# Patient Record
Sex: Female | Born: 1940
Health system: Southern US, Community
[De-identification: ages and names within clinical notes are randomized; demographics above are authoritative.]

## PROBLEM LIST (undated history)

## (undated) DIAGNOSIS — M359 Systemic involvement of connective tissue, unspecified: Secondary | ICD-10-CM

## (undated) DIAGNOSIS — M797 Fibromyalgia: Secondary | ICD-10-CM

## (undated) DIAGNOSIS — F419 Anxiety disorder, unspecified: Secondary | ICD-10-CM

## (undated) DIAGNOSIS — E119 Type 2 diabetes mellitus without complications: Secondary | ICD-10-CM

## (undated) DIAGNOSIS — R739 Hyperglycemia, unspecified: Secondary | ICD-10-CM

## (undated) DIAGNOSIS — T50905A Adverse effect of unspecified drugs, medicaments and biological substances, initial encounter: Secondary | ICD-10-CM

## (undated) DIAGNOSIS — F319 Bipolar disorder, unspecified: Secondary | ICD-10-CM

## (undated) DIAGNOSIS — I10 Essential (primary) hypertension: Secondary | ICD-10-CM

## (undated) DIAGNOSIS — M1990 Unspecified osteoarthritis, unspecified site: Secondary | ICD-10-CM

## (undated) DIAGNOSIS — D500 Iron deficiency anemia secondary to blood loss (chronic): Secondary | ICD-10-CM

## (undated) HISTORY — PX: EYE SURGERY: SHX253

## (undated) HISTORY — PX: COLONOSCOPY: SHX174

## (undated) HISTORY — PX: CHOLECYSTECTOMY: SHX55

## (undated) HISTORY — PX: ABDOMINAL HYSTERECTOMY: SHX81

## (undated) HISTORY — DX: Essential (primary) hypertension: I10

## (undated) HISTORY — DX: Unspecified osteoarthritis, unspecified site: M19.90

---

## 2000-09-07 ENCOUNTER — Ambulatory Visit (HOSPITAL_COMMUNITY): Admission: RE | Admit: 2000-09-07 | Discharge: 2000-09-07 | Payer: Self-pay | Admitting: Family Medicine

## 2000-09-07 ENCOUNTER — Encounter: Payer: Self-pay | Admitting: Family Medicine

## 2000-10-07 ENCOUNTER — Encounter: Payer: Self-pay | Admitting: Gastroenterology

## 2000-10-07 ENCOUNTER — Encounter: Admission: RE | Admit: 2000-10-07 | Discharge: 2000-10-07 | Payer: Self-pay | Admitting: Gastroenterology

## 2000-10-22 ENCOUNTER — Encounter: Payer: Self-pay | Admitting: Gastroenterology

## 2000-10-22 ENCOUNTER — Ambulatory Visit (HOSPITAL_COMMUNITY): Admission: RE | Admit: 2000-10-22 | Discharge: 2000-10-22 | Payer: Self-pay | Admitting: Gastroenterology

## 2001-06-09 ENCOUNTER — Encounter: Admission: RE | Admit: 2001-06-09 | Discharge: 2001-06-09 | Payer: Self-pay | Admitting: Family Medicine

## 2001-06-09 ENCOUNTER — Encounter: Payer: Self-pay | Admitting: Family Medicine

## 2004-03-04 ENCOUNTER — Other Ambulatory Visit: Admission: RE | Admit: 2004-03-04 | Discharge: 2004-03-04 | Payer: Self-pay | Admitting: Family Medicine

## 2005-04-01 ENCOUNTER — Ambulatory Visit: Payer: Self-pay | Admitting: Cardiology

## 2005-04-07 ENCOUNTER — Ambulatory Visit: Payer: Self-pay

## 2005-11-25 ENCOUNTER — Ambulatory Visit: Payer: Self-pay | Admitting: Gastroenterology

## 2005-12-22 ENCOUNTER — Ambulatory Visit: Payer: Self-pay | Admitting: Gastroenterology

## 2005-12-22 ENCOUNTER — Encounter (INDEPENDENT_AMBULATORY_CARE_PROVIDER_SITE_OTHER): Payer: Self-pay | Admitting: *Deleted

## 2006-01-04 ENCOUNTER — Ambulatory Visit: Payer: Self-pay | Admitting: Gastroenterology

## 2006-04-23 ENCOUNTER — Other Ambulatory Visit: Admission: RE | Admit: 2006-04-23 | Discharge: 2006-04-23 | Payer: Self-pay | Admitting: Family Medicine

## 2006-06-15 ENCOUNTER — Ambulatory Visit: Payer: Self-pay | Admitting: Gastroenterology

## 2006-06-15 LAB — CONVERTED CEMR LAB
CO2: 30 meq/L (ref 19–32)
Chloride: 102 meq/L (ref 96–112)
Eosinophils Relative: 1.9 % (ref 0.0–5.0)
Hemoglobin: 12.6 g/dL (ref 12.0–15.0)
Lymphocytes Relative: 18.5 % (ref 12.0–46.0)
MCHC: 34.1 g/dL (ref 30.0–36.0)
MCV: 88.5 fL (ref 78.0–100.0)
Monocytes Absolute: 0.2 10*3/uL (ref 0.2–0.7)
Monocytes Relative: 3 % (ref 3.0–11.0)
Platelets: 365 10*3/uL (ref 150–400)
Potassium: 4.2 meq/L (ref 3.5–5.1)
RBC: 4.18 M/uL (ref 3.87–5.11)
RDW: 13.9 % (ref 11.5–14.6)
Sed Rate: 38 mm/hr — ABNORMAL HIGH (ref 0–25)

## 2006-06-16 ENCOUNTER — Encounter: Payer: Self-pay | Admitting: Gastroenterology

## 2006-06-16 ENCOUNTER — Ambulatory Visit: Payer: Self-pay | Admitting: Gastroenterology

## 2006-06-28 ENCOUNTER — Inpatient Hospital Stay (HOSPITAL_COMMUNITY): Admission: EM | Admit: 2006-06-28 | Discharge: 2006-07-05 | Payer: Self-pay | Admitting: Emergency Medicine

## 2006-07-02 ENCOUNTER — Ambulatory Visit: Payer: Self-pay | Admitting: Gastroenterology

## 2006-07-13 ENCOUNTER — Ambulatory Visit: Payer: Self-pay | Admitting: Gastroenterology

## 2006-07-13 LAB — CONVERTED CEMR LAB
ALT: 32 units/L (ref 0–40)
Albumin: 3.3 g/dL — ABNORMAL LOW (ref 3.5–5.2)
Alkaline Phosphatase: 55 units/L (ref 39–117)
BUN: 12 mg/dL (ref 6–23)
Basophils Relative: 1.4 % — ABNORMAL HIGH (ref 0.0–1.0)
Bilirubin, Direct: 0.1 mg/dL (ref 0.0–0.3)
Eosinophils Absolute: 0 10*3/uL (ref 0.0–0.6)
Eosinophils Relative: 0 % (ref 0.0–5.0)
GFR calc Af Amer: 81 mL/min
HCT: 37.6 % (ref 36.0–46.0)
Lymphocytes Relative: 6.3 % — ABNORMAL LOW (ref 12.0–46.0)
MCV: 88.9 fL (ref 78.0–100.0)
Monocytes Absolute: 0.2 10*3/uL (ref 0.2–0.7)
Monocytes Relative: 1.1 % — ABNORMAL LOW (ref 3.0–11.0)
Platelets: 447 10*3/uL — ABNORMAL HIGH (ref 150–400)
Sodium: 140 meq/L (ref 135–145)
TSH: 0.47 microintl units/mL (ref 0.35–5.50)
WBC: 14.1 10*3/uL — ABNORMAL HIGH (ref 4.5–10.5)

## 2006-08-03 ENCOUNTER — Ambulatory Visit: Payer: Self-pay | Admitting: Gastroenterology

## 2006-09-07 ENCOUNTER — Ambulatory Visit: Payer: Self-pay | Admitting: Gastroenterology

## 2006-10-18 ENCOUNTER — Ambulatory Visit: Payer: Self-pay | Admitting: Gastroenterology

## 2006-11-19 ENCOUNTER — Ambulatory Visit: Payer: Self-pay | Admitting: Gastroenterology

## 2006-11-19 LAB — CONVERTED CEMR LAB
BUN: 14 mg/dL (ref 6–23)
CO2: 29 meq/L (ref 19–32)
Calcium: 9.8 mg/dL (ref 8.4–10.5)
Eosinophils Absolute: 0.1 10*3/uL (ref 0.0–0.6)
Eosinophils Relative: 1.4 % (ref 0.0–5.0)
GFR calc Af Amer: 81 mL/min
MCHC: 34.5 g/dL (ref 30.0–36.0)
MCV: 87.2 fL (ref 78.0–100.0)
Monocytes Absolute: 0.4 10*3/uL (ref 0.2–0.7)
Neutro Abs: 3 10*3/uL (ref 1.4–7.7)
Neutrophils Relative %: 62.7 % (ref 43.0–77.0)
Platelets: 257 10*3/uL (ref 150–400)
Potassium: 4 meq/L (ref 3.5–5.1)
RBC: 4.71 M/uL (ref 3.87–5.11)
Sed Rate: 15 mm/hr (ref 0–25)
WBC: 5 10*3/uL (ref 4.5–10.5)

## 2006-12-01 ENCOUNTER — Ambulatory Visit: Payer: Self-pay | Admitting: Cardiology

## 2006-12-20 ENCOUNTER — Ambulatory Visit: Payer: Self-pay | Admitting: Gastroenterology

## 2007-01-07 ENCOUNTER — Ambulatory Visit: Payer: Self-pay | Admitting: Gastroenterology

## 2007-01-07 LAB — CONVERTED CEMR LAB
BUN: 10 mg/dL (ref 6–23)
Basophils Absolute: 0.1 10*3/uL (ref 0.0–0.1)
Basophils Relative: 1.3 % — ABNORMAL HIGH (ref 0.0–1.0)
Chloride: 108 meq/L (ref 96–112)
Creatinine, Ser: 1 mg/dL (ref 0.4–1.2)
Eosinophils Absolute: 0.2 10*3/uL (ref 0.0–0.6)
GFR calc Af Amer: 72 mL/min
Glucose, Bld: 107 mg/dL — ABNORMAL HIGH (ref 70–99)
HCT: 37.5 % (ref 36.0–46.0)
Hemoglobin: 13 g/dL (ref 12.0–15.0)
Lymphocytes Relative: 25.9 % (ref 12.0–46.0)
MCV: 88.2 fL (ref 78.0–100.0)
Neutro Abs: 3.2 10*3/uL (ref 1.4–7.7)
Potassium: 4 meq/L (ref 3.5–5.1)
RBC: 4.26 M/uL (ref 3.87–5.11)
Sodium: 143 meq/L (ref 135–145)
Total Bilirubin: 1.3 mg/dL — ABNORMAL HIGH (ref 0.3–1.2)

## 2007-01-21 ENCOUNTER — Ambulatory Visit: Payer: Self-pay | Admitting: Gastroenterology

## 2007-02-22 ENCOUNTER — Ambulatory Visit: Payer: Self-pay | Admitting: Gastroenterology

## 2007-02-22 LAB — CONVERTED CEMR LAB
Alkaline Phosphatase: 55 units/L (ref 39–117)
BUN: 16 mg/dL (ref 6–23)
Basophils Absolute: 0 10*3/uL (ref 0.0–0.1)
CO2: 30 meq/L (ref 19–32)
Calcium: 9.5 mg/dL (ref 8.4–10.5)
Creatinine, Ser: 1 mg/dL (ref 0.4–1.2)
Eosinophils Relative: 0.1 % (ref 0.0–5.0)
GFR calc non Af Amer: 59 mL/min
Glucose, Bld: 200 mg/dL — ABNORMAL HIGH (ref 70–99)
Hemoglobin: 13.2 g/dL (ref 12.0–15.0)
Lymphocytes Relative: 7.1 % — ABNORMAL LOW (ref 12.0–46.0)
MCV: 93.5 fL (ref 78.0–100.0)
Neutro Abs: 7.6 10*3/uL (ref 1.4–7.7)
Platelets: 258 10*3/uL (ref 150–400)
Potassium: 4.5 meq/L (ref 3.5–5.1)
RBC: 4.06 M/uL (ref 3.87–5.11)
RDW: 13.4 % (ref 11.5–14.6)
Total Bilirubin: 2.8 mg/dL — ABNORMAL HIGH (ref 0.3–1.2)
Total Protein: 6.6 g/dL (ref 6.0–8.3)

## 2007-03-03 ENCOUNTER — Encounter: Payer: Self-pay | Admitting: Cardiology

## 2007-03-03 ENCOUNTER — Ambulatory Visit: Payer: Self-pay | Admitting: Cardiology

## 2007-03-03 ENCOUNTER — Ambulatory Visit: Payer: Self-pay | Admitting: Cardiovascular Disease

## 2007-03-03 ENCOUNTER — Inpatient Hospital Stay (HOSPITAL_COMMUNITY): Admission: EM | Admit: 2007-03-03 | Discharge: 2007-03-04 | Payer: Self-pay | Admitting: Emergency Medicine

## 2007-03-08 ENCOUNTER — Ambulatory Visit: Payer: Self-pay | Admitting: Gastroenterology

## 2007-03-08 DIAGNOSIS — I10 Essential (primary) hypertension: Secondary | ICD-10-CM | POA: Insufficient documentation

## 2007-03-08 DIAGNOSIS — K5150 Left sided colitis without complications: Secondary | ICD-10-CM | POA: Insufficient documentation

## 2007-03-15 ENCOUNTER — Ambulatory Visit: Payer: Self-pay | Admitting: Cardiology

## 2007-03-25 ENCOUNTER — Ambulatory Visit: Payer: Self-pay | Admitting: Gastroenterology

## 2007-04-26 ENCOUNTER — Ambulatory Visit: Payer: Self-pay | Admitting: Gastroenterology

## 2007-04-27 ENCOUNTER — Ambulatory Visit: Payer: Self-pay | Admitting: Gastroenterology

## 2007-04-27 ENCOUNTER — Encounter: Payer: Self-pay | Admitting: Gastroenterology

## 2007-05-25 ENCOUNTER — Ambulatory Visit: Payer: Self-pay | Admitting: Cardiology

## 2007-06-01 ENCOUNTER — Ambulatory Visit: Payer: Self-pay | Admitting: Gastroenterology

## 2007-07-04 ENCOUNTER — Emergency Department (HOSPITAL_COMMUNITY): Admission: EM | Admit: 2007-07-04 | Discharge: 2007-07-04 | Payer: Self-pay | Admitting: Emergency Medicine

## 2007-07-04 ENCOUNTER — Inpatient Hospital Stay (HOSPITAL_COMMUNITY): Admission: AD | Admit: 2007-07-04 | Discharge: 2007-07-10 | Payer: Self-pay | Admitting: Internal Medicine

## 2007-07-10 ENCOUNTER — Encounter: Payer: Self-pay | Admitting: Internal Medicine

## 2007-07-13 ENCOUNTER — Ambulatory Visit: Payer: Self-pay | Admitting: Internal Medicine

## 2007-07-21 ENCOUNTER — Ambulatory Visit: Payer: Self-pay | Admitting: Internal Medicine

## 2007-07-27 ENCOUNTER — Encounter: Payer: Self-pay | Admitting: Gastroenterology

## 2007-07-27 ENCOUNTER — Ambulatory Visit: Payer: Self-pay | Admitting: Gastroenterology

## 2007-07-27 ENCOUNTER — Telehealth: Payer: Self-pay | Admitting: Gastroenterology

## 2007-07-28 ENCOUNTER — Telehealth (INDEPENDENT_AMBULATORY_CARE_PROVIDER_SITE_OTHER): Payer: Self-pay

## 2007-08-12 ENCOUNTER — Ambulatory Visit: Payer: Self-pay | Admitting: Gastroenterology

## 2007-08-17 ENCOUNTER — Telehealth (INDEPENDENT_AMBULATORY_CARE_PROVIDER_SITE_OTHER): Payer: Self-pay | Admitting: *Deleted

## 2007-10-25 ENCOUNTER — Ambulatory Visit: Payer: Self-pay | Admitting: Gastroenterology

## 2007-11-08 ENCOUNTER — Telehealth: Payer: Self-pay | Admitting: Gastroenterology

## 2007-12-13 ENCOUNTER — Ambulatory Visit: Payer: Self-pay | Admitting: Gastroenterology

## 2008-01-08 ENCOUNTER — Emergency Department (HOSPITAL_COMMUNITY): Admission: EM | Admit: 2008-01-08 | Discharge: 2008-01-08 | Payer: Self-pay | Admitting: Emergency Medicine

## 2008-03-06 ENCOUNTER — Ambulatory Visit: Payer: Self-pay | Admitting: Gastroenterology

## 2008-03-08 LAB — CONVERTED CEMR LAB
AST: 19 units/L (ref 0–37)
Basophils Relative: 1 % (ref 0.0–3.0)
CO2: 33 meq/L — ABNORMAL HIGH (ref 19–32)
Calcium: 9.9 mg/dL (ref 8.4–10.5)
Chloride: 103 meq/L (ref 96–112)
Eosinophils Relative: 1.3 % (ref 0.0–5.0)
GFR calc Af Amer: 64 mL/min
GFR calc non Af Amer: 53 mL/min
Hemoglobin: 13.6 g/dL (ref 12.0–15.0)
Monocytes Absolute: 0.4 10*3/uL (ref 0.1–1.0)
Monocytes Relative: 5.9 % (ref 3.0–12.0)
RDW: 13.6 % (ref 11.5–14.6)
Sed Rate: 24 mm/hr — ABNORMAL HIGH (ref 0–22)
Total Protein: 6.9 g/dL (ref 6.0–8.3)
WBC: 6.9 10*3/uL (ref 4.5–10.5)

## 2008-03-09 ENCOUNTER — Telehealth: Payer: Self-pay | Admitting: Gastroenterology

## 2008-03-15 ENCOUNTER — Emergency Department (HOSPITAL_COMMUNITY): Admission: EM | Admit: 2008-03-15 | Discharge: 2008-03-15 | Payer: Self-pay | Admitting: Emergency Medicine

## 2008-03-15 ENCOUNTER — Encounter: Payer: Self-pay | Admitting: Gastroenterology

## 2008-03-15 ENCOUNTER — Encounter (HOSPITAL_COMMUNITY): Admission: RE | Admit: 2008-03-15 | Discharge: 2008-06-13 | Payer: Self-pay | Admitting: Gastroenterology

## 2008-04-04 ENCOUNTER — Ambulatory Visit: Payer: Self-pay | Admitting: Gastroenterology

## 2008-04-25 ENCOUNTER — Ambulatory Visit: Payer: Self-pay | Admitting: Cardiology

## 2008-05-07 ENCOUNTER — Ambulatory Visit: Payer: Self-pay | Admitting: Gastroenterology

## 2008-05-24 ENCOUNTER — Encounter: Payer: Self-pay | Admitting: Gastroenterology

## 2008-06-06 ENCOUNTER — Telehealth: Payer: Self-pay | Admitting: Gastroenterology

## 2008-06-07 ENCOUNTER — Ambulatory Visit: Payer: Self-pay | Admitting: Gastroenterology

## 2008-06-08 LAB — CONVERTED CEMR LAB
ALT: 13 units/L (ref 0–35)
Alkaline Phosphatase: 56 units/L (ref 39–117)
Creatinine, Ser: 1 mg/dL (ref 0.4–1.2)
GFR calc non Af Amer: 58.84 mL/min (ref >60)
HCT: 40.3 % (ref 36.0–46.0)
Hemoglobin: 13.5 g/dL (ref 12.0–15.0)
Lymphs Abs: 1.7 10*3/uL (ref 0.7–4.0)
MCHC: 33.3 g/dL (ref 30.0–36.0)
MCV: 90.6 fL (ref 78.0–100.0)
Monocytes Relative: 5.7 % (ref 3.0–12.0)
Neutrophils Relative %: 72 % (ref 43.0–77.0)
Potassium: 4.1 meq/L (ref 3.5–5.1)
RDW: 12.9 % (ref 11.5–14.6)
Sodium: 139 meq/L (ref 135–145)
Total Protein: 7 g/dL (ref 6.0–8.3)
WBC: 8.2 10*3/uL (ref 4.5–10.5)

## 2008-06-09 ENCOUNTER — Encounter: Payer: Self-pay | Admitting: Gastroenterology

## 2008-06-12 ENCOUNTER — Ambulatory Visit: Payer: Self-pay | Admitting: Gastroenterology

## 2008-06-14 ENCOUNTER — Encounter: Payer: Self-pay | Admitting: Gastroenterology

## 2008-07-09 ENCOUNTER — Encounter: Payer: Self-pay | Admitting: Gastroenterology

## 2008-07-12 ENCOUNTER — Encounter: Payer: Self-pay | Admitting: Gastroenterology

## 2008-10-10 ENCOUNTER — Telehealth: Payer: Self-pay | Admitting: Gastroenterology

## 2008-11-08 ENCOUNTER — Telehealth: Payer: Self-pay | Admitting: Gastroenterology

## 2008-11-09 ENCOUNTER — Ambulatory Visit: Payer: Self-pay | Admitting: Gastroenterology

## 2008-11-09 LAB — CONVERTED CEMR LAB
ALT: 13 units/L (ref 0–35)
AST: 23 units/L (ref 0–37)
Alkaline Phosphatase: 53 units/L (ref 39–117)
BUN: 13 mg/dL (ref 6–23)
CO2: 29 meq/L (ref 19–32)
Calcium: 9.2 mg/dL (ref 8.4–10.5)
Eosinophils Absolute: 0 10*3/uL (ref 0.0–0.7)
GFR calc non Af Amer: 52.64 mL/min (ref >60)
Glucose, Bld: 109 mg/dL — ABNORMAL HIGH (ref 70–99)
HCT: 37.5 % (ref 36.0–46.0)
Hemoglobin: 13 g/dL (ref 12.0–15.0)
Lymphocytes Relative: 22.1 % (ref 12.0–46.0)
MCV: 91.8 fL (ref 78.0–100.0)
Monocytes Absolute: 0.8 10*3/uL (ref 0.1–1.0)
Potassium: 3.8 meq/L (ref 3.5–5.1)
RBC: 4.09 M/uL (ref 3.87–5.11)
RDW: 12.9 % (ref 11.5–14.6)
Total Protein: 6.6 g/dL (ref 6.0–8.3)

## 2008-12-21 ENCOUNTER — Ambulatory Visit: Payer: Self-pay | Admitting: Gastroenterology

## 2009-01-11 ENCOUNTER — Encounter: Payer: Self-pay | Admitting: Gastroenterology

## 2009-02-18 ENCOUNTER — Ambulatory Visit: Payer: Self-pay | Admitting: Cardiology

## 2009-02-20 ENCOUNTER — Telehealth (INDEPENDENT_AMBULATORY_CARE_PROVIDER_SITE_OTHER): Payer: Self-pay | Admitting: *Deleted

## 2009-02-22 ENCOUNTER — Encounter: Payer: Self-pay | Admitting: Gastroenterology

## 2009-03-28 ENCOUNTER — Encounter: Payer: Self-pay | Admitting: Gastroenterology

## 2009-05-16 ENCOUNTER — Telehealth: Payer: Self-pay | Admitting: Cardiology

## 2009-05-21 ENCOUNTER — Encounter
Admission: RE | Admit: 2009-05-21 | Discharge: 2009-05-21 | Payer: Self-pay | Source: Home / Self Care | Admitting: Internal Medicine

## 2010-03-06 ENCOUNTER — Inpatient Hospital Stay (HOSPITAL_COMMUNITY): Admission: EM | Admit: 2010-03-06 | Discharge: 2010-03-13 | Payer: Self-pay | Source: Home / Self Care

## 2010-03-19 ENCOUNTER — Encounter (INDEPENDENT_AMBULATORY_CARE_PROVIDER_SITE_OTHER): Payer: Self-pay | Admitting: *Deleted

## 2010-03-25 ENCOUNTER — Ambulatory Visit
Admission: RE | Admit: 2010-03-25 | Discharge: 2010-03-25 | Payer: Self-pay | Source: Home / Self Care | Attending: Internal Medicine | Admitting: Internal Medicine

## 2010-04-14 ENCOUNTER — Encounter: Payer: Self-pay | Admitting: Gastroenterology

## 2010-04-15 ENCOUNTER — Encounter: Payer: Self-pay | Admitting: Cardiology

## 2010-04-18 NOTE — Discharge Summary (Signed)
[name], [name]                ACCOUNT NO.:  [id number]  MEDICAL RECORD NO.:  [id number]          PATIENT TYPE:  INP  LOCATION:  A337                          FACILITY:  [facility]  PHYSICIAN:  [name], MD  DATE OF BIRTH:  [birth year]  DATE OF ADMISSION:  03/06/2010 DATE OF DISCHARGE:  12/22/2011LH                         DISCHARGE SUMMARY-REFERRING   DISCHARGING DIAGNOSES: 1. Inflammatory bowel disease with exacerbation.  The patient has a     known history of ulcerative colitis.  She was treated with bowel     rest, steroids, and adjustments to her mercaptopurine.  She will     follow up in the next 4 weeks with Dr. [name]. 2. Hypokalemia.  Patient was repleted with p.o. and IV medication.     She will be discharged to home on potassium chloride 20 mEq 2     tablets daily with meal.  She will have blood work, and follow up     with Dr. [name] as an outpatient. 3. Chronic loose stools.  The patient is having less and less     stooling. However, she still perceives it as large, but the counted     and evaluated stools were decreased in size and amount.  She is to     continue her mercaptopurine and steroids, and will follow up with     Dr. [name] as an outpatient. 4. Significant anxiety with a social anxiety component.  The patient     feels because of her increased bowel movements that she can no     longer go to church.  She therefore feels socially isolated.  We     have discussed continuing her antidepressant medication at a more     appropriate dose for her current condition and receiving counseling     as an outpatient.  She will continue on her Xanax which we did up     titrate slowly at this time to allow her to take it 3 times daily. 5. For pain the patient continues on hydrocodone. 6. Hypertension.  She continues on her benazepril and     hydrochlorothiazide. 7. Hyperlipidemia.  She will continue on her omega fatty acids. 8. Chronic back pain related to the  discomfort from her ulcerative     colitis.  She did respond favorably to a lidocaine patch.  Will     therefore provide her with that. 9. Nausea.  She does have as needed oral Zofran and tramadol. 10.Insomnia.  She did tolerate Ambien well, but I have not released     her to home with any.  She will continue her anti-anxiolytic.  The     patient is discharged to home on her usual dose of Ambien 10 mg. 11.Yeast in her stool.  She did have a short course of Florastor.     This could be resumed if she continued to have difficulty. 12.Anemia.  Currently stable.  That will be followed by her primary     care physician.  CONSULTATIONS DURING THE COURSE OF HER HOSPITAL STAY:  Dr. [name].  MEDICATIONS AT THE TIME OF DISCHARGE: 1.  Celexa 20 mg daily. 2. Hyoscyamine  0.125 mg 3 times daily before meals. 3. Lidocaine patch on 12 hours, off 12 hours. 4. Mercaptopurine 50 mg, 100 mg daily with meals. 5. Potassium 20 mEq p.o. daily with a meal. 6. Prednisone 10 mg, 40 mg daily with a meal. 7. Xanax 1 tablet by mouth 3 times daily. 8. Zofran 1 tablet 4 mg q.4 hours as needed for nausea. 9. Ambien 1 mg by mouth at bedtime. 10.Benazepril/hydrochlorothiazide 20/12.5 one tablet p.o. by mouth     daily. 11.Hydrocodone 1 tablet by mouth every 4 hours as needed for pain. 12.Imodium 2 mg by mouth after every loose stool as needed. 13.Omega-3 1 tablet by mouth daily. 14.Ranitidine 150 mg by mouth daily. 15.Tramadol 50 mg twice daily as needed for pain. 16.Tylenol 500 mg every 4 hours as needed for pain. 17.The patient has been instructed to stop the higher dose of Celexa,     to stop the alprazolam 0.5 twice daily because she was increased to     3 times a day, and to stop the mercaptopurine 50 mg by mouth daily     as she was increased to 50 mg x2 tablets daily.  HOSPITAL COURSE:  Patient is a pleasant 70 year old female who presented to the hospital with increased stooling and abdominal discomfort.   The patient has a known history of ulcerative colitis, and appeared to be having a flare.  She was therefore admitted to the hospital, seen by GI, and her medications were titrated.  She was given bowel rest.  Of note the patient is suffering significantly from the social phobia and anxiety that is related to having frequent bowel movements.  She has an element of depression which is causing her to be focused on her pain, and is causing her some dysfunction.  We have offered outpatient therapy for that and continuation of her anti-anxiolytics and anti-depression medications.  The patient was seen and evaluated by GI.  Her diet was progressed forward.  She did undergo CT scan of the abdomen which showed diffuse thickening of distal sigmoid colon.  She was treated with ciprofloxacin and metronidazole on December 15, and her mercaptopurine was increased.  She had completed a course of steroids, but those were up titrated during the hospital stay.  The patient was treated for her known history of reflux, and we added a lidocaine patch for some of the back discomfort that she described.  In general at the time of discharge the patient was up and ambulating.  Her mood was still moderately low, but improved.  Her anxiety was manageable.  On the day of discharge the patient's vital signs revealed temperature of 98.3, blood pressure 137/70, pulse 61, respirations 20, saturation 96%. GENERAL:  This is a trim white female in no acute distress.  She is somewhat seductive in her behavior.  She is very focused on her pain and her isolation. Otherwise, she is normocephalic, atraumatic.  Pupils equal, round, reactive to light.  Extraocular muscles are intact.  Mucous membranes are moist. NECK:  Supple.  There is no JVD, no lymph nodes, no carotid bruits. CHEST:  Decreased, but clear to auscultation.  There are no rhonchi, rales or wheezes. CARDIOVASCULAR:  Regular rate and rhythm.  Positive S1, S2.   No S3, S4. No murmurs, rubs or gallops. ABDOMEN:  Was soft, minimally tender.  No guarding, no rebound were present. NEUROLOGICAL:  Awake, alert, oriented.  Cranial nerves II-XII are intact.  Power was 5/5.  DTRs are 2+.  Plantars are downgoing.  PERTINENT LABORATORIES:  Stool culture just showed mild yeast.  Her potassium at discharge was 3.7.  Her white count was 8.3, hemoglobin 11.5, hematocrit 34.2, platelets of 324.  Sodium 139.  Her earlier potassium before repletion was 2.9, chloride 99, CO2 33, BUN 13, creatinine 0.95, glucose of 110.  Ova and parasite were negative.  CRP was slightly elevated at 0.6.  C. difficile was negative.  Hemoglobin A1c was 6.2.  At this time the patient is deemed stable for discharge to follow up with Dr. Laurance Flatten and Dr. [name] as an outpatient.  She has been advised about and offered counseling for her depression and anxiety related to her chronic illness.  She will disposition to home.  CONDITION:  Stable.     [name], MD     MLT/MEDQ  D:  04/04/2010  T:  04/04/2010  Job:  [id number]  cc:   [name], M.D.  Chipper Herb, M.D. Fax: [phone]  Electronically Signed by Billey Chang MD on 04/16/2010 33:74:45 AM

## 2010-04-21 ENCOUNTER — Ambulatory Visit
Admission: RE | Admit: 2010-04-21 | Discharge: 2010-04-21 | Payer: Self-pay | Source: Home / Self Care | Attending: Internal Medicine | Admitting: Internal Medicine

## 2010-04-24 NOTE — Letter (Signed)
Summary: [facility]   Imported By: Rise Patience 04/15/2009 15:09:33  _____________________________________________________________________  External Attachment:    Type:   Image     Comment:   External Document

## 2010-04-24 NOTE — Progress Notes (Signed)
Summary: returning call  Medications Added BENAZEPRIL-HYDROCHLOROTHIAZIDE 20-12.5 MG TABS (BENAZEPRIL-HYDROCHLOROTHIAZIDE) 1 by mouth once daily       Phone Note Call from Patient Call back at [facility] Phone [phone]   Caller: Patient Reason for Call: Talk to Nurse Summary of Call: returning call Initial call taken by: Darnell Level,  May 16, 2009 9:28 AM  Follow-up for Phone Call        Pt switched to Benazapril/hct because Diovan hct is too expensive. Will send in new rx. Levora Angel, CNA  May 16, 2009 10:25 AM  Follow-up by: Levora Angel, CNA,  May 16, 2009 10:25 AM    New/Updated Medications: BENAZEPRIL-HYDROCHLOROTHIAZIDE 20-12.5 MG TABS (BENAZEPRIL-HYDROCHLOROTHIAZIDE) 1 by mouth once daily Prescriptions: BENAZEPRIL-HYDROCHLOROTHIAZIDE 20-12.5 MG TABS (BENAZEPRIL-HYDROCHLOROTHIAZIDE) 1 by mouth once daily  #30 x 3   Entered by:   Levora Angel, CNA   Authorized by:   Minus Breeding, MD, [facility]   Signed by:   Levora Angel, CNA on 05/16/2009   Method used:   Electronically to        [facility] (retail)       [address], [place]  [address]       Ph: [phone]       Fax: [phone]   RxID:   [id number]

## 2010-04-24 NOTE — Miscellaneous (Signed)
Summary: CONSULTATION  Clinical Lists Changes  NAME:  [name], [name]                ACCOUNT NO.:  [id number]      MEDICAL RECORD NO.:  [id number]          PATIENT TYPE:  INP      LOCATION:  A204                          FACILITY:  [facility]      PHYSICIAN:  R. Garfield Cornea, M.D. DATE OF BIRTH:  09/28/1940      DATE OF CONSULTATION:  03/07/2010   DATE OF DISCHARGE:                                    CONSULTATION         REFERRING PHYSICIAN:  Dr. Jonelle Sidle.      REASON FOR CONSULTATION:  Acute colitis, likely related to already-known   ulcerative colitis.      HISTORY OF PRESENT ILLNESS:  [name] is a 70 year old Caucasian   female who has quite an extensive history of ulcerative colitis with   multiple flares.  She has been seen most recently by Dr. [name] in   [place].  She was actually admitted into [facility] August 23 through 30   as well is in November for 7 days due to the acute flares.  She seems to   be quite steroid-dependent.  Every time she favors off of steroids, she   ends up having acute episodes.  She presents on December 15 with   complaints of worsening lower abdominal cramping and pain x1 week.   Reports gas as well as well as urgency, bloody diarrhea.  She states she   had 23 loose stools yesterday.  She states since 1 a.m. she has had 12   loose stools.  She does have slight nausea but no vomiting.  She   complained of 9/10 pain which brought her to the emergency room.  This   is associated with the diarrhea.  She just finished prednisone taper 3   weeks ago.  She has tried numerous agents in the past including Lialda,   Asacol, Remicade, Colazal, enemas, AZATHIOPRINE, which she is allergic   to, which caused nausea, vomiting and chest pain.  She was actually   referred to see Dr. Morton Stall at [facility] for possible surgical   intervention.  She desires not to proceed with this.  She has had at   least 5 colonoscopies in the last 7 years.  We do not have the most   recent  report from Dr. [name], which was done in May or June of this   year at [facility].  The most recent colonoscopy we have was February 2009   showing an incomplete colonoscopy.  The cecum was not able to be seen   endoscopically.  Severe colitis up to 25 to 30 cm from the anal verge.   Again, this was done back in 2009.  She denies the use of nonsteroidals.   She does have an occasional baby aspirin.  She denies any contact with   anyone that is sick, and she has been on no recent antibiotics.  As   mentioned before, she has had multiple hospitalizations secondary to   acute flares.      PAST MEDICAL HISTORY:  1. Left-sided ulcerative colitis diagnosed 4 years ago.   2. History of diverticular disease.   3. Hypertension.   4. Hyperlipidemia.   5. Atypical chest pain.   6. Occasional reflux that is not require any medications.      PAST SURGICAL HISTORY:   1. Cholecystectomy.   2. Hysterectomy.   3. Bilateral oophorectomy.   4. Her last colonoscopy was reportedly in May or June of this year       with Dr. [name].      ALLERGIES:  LASIX, PENICILLIN, SULFA, CODEINE, BETADINE, ERYTHROMYCIN,   AZATHIOPRINE.      MEDICATIONS PRIOR TO ADMISSION:  Ambien, Celexa, Zantac, omega-3 fatty   acids, hydrocodone, tramadol, Tylenol, citalopram, benazepril,   hydrochlorothiazide, alprazolam, mercaptopurine, Imodium p.r.n.      SOCIAL HISTORY:  She is widowed.  She lives in [place].  She denies the   use of tobacco or IV drugs.  She does have occasional wine monthly.      FAMILY HISTORY:  Both parents deceased from MI.  Brother died of an MI.   Another brother has coronary artery disease.  She does have a family   history of colon cancer, 2 aunts that were diagnosed around the age of   52.      REVIEW OF SYSTEMS:  Negative except as mentioned in the HPI.      PHYSICAL EXAM:  VITAL SIGNS:  BP 126/60, pulse is 72, respirations 18,   temperature 98.   GENERAL:  She is in no apparent distress.   She is awake, alert and   oriented.   HEENT:  Sclerae are without icterus.   NECK:  Supple.  No JVD.  No lymphadenopathy.  And no thyromegaly.   RESPIRATORY:  Clear to auscultation bilaterally.  No wheezes, rales or   rhonchi.   CARDIOVASCULAR:  S1 and S2 present.  No murmurs, rubs or gallops.   ABDOMEN:  Soft, nondistended.  She has moderate tenderness in the   midabdomen and left lower quadrant.  There is no rebound or guarding.   EXTREMITIES:  Without edema.   SKIN:  Without any rashes or ulcers, is warm and dry.      PERTINENT LABS FOR THIS ADMISSION:  An admitting hemoglobin of 11.6 and   34.6.  Repeat on December 16 was 10.9 and 32.5.  White count is normal   at 4.9.  INR 0.91.  Sodium 137, potassium 3.8, glucose 108, BUN 6,   creatinine 0.98.  LFTs are normal.  Total protein is 5.3.  Albumin is   2.8.  Calcium is 8.2.      Actually, in March 2011, of course this is not this admission, in March   2011 a virtual colonoscopy was performed; however, this study was   terminated due to increased pain.  CT of abdomen and pelvis on December   15 showed diffuse wall thickening involving the sigmoid colon with   numerous surrounding small lymph nodes and mild soft tissue   inflammation.  The findings were compatible with segmental colitis which   likely reflects the patient's history of ulcerative colitis.  Remainder   of colon unremarkable in appearance.  No perforation or abscess.   Diverticulosis noted along the sigmoid colon.  No evidence of acute   diverticulitis.  Scattered small left renal cysts and mild left-sided   nonspecific perinephric stranding __________ bibasilar atelectasis   noted.      ASSESSMENT AND PLAN:   61. A 70 year old  Caucasian female with a known history of ulcerative       colitis with multiple flares that has tried multiple medications,       currently admitted with mercaptopurine and had weaned off       prednisone taper approximately 3 weeks ago.  It is  apparent that       she finds it difficult to be off of steroids as this normally       causes an acute flare.  Cipro and Flagyl can be continued for       possible colitis, although it is likely this is an ulcerative       colitis flare.  Agree with steroids for acute episode.  It looks       like her mercaptopurine is not being continued in the hospital.  I       will speak with Dr. Gala Romney about continue this inpatient.  Continue       supportive measures.  Likely no need for stool studies as this is       probably secondary to the Protonix daily.  Continue to monitor H       and H.   2. Obtain records from [place], which has already been requested, so       we can then have a more accurate picture of her presentation.      We will continue to follow to assist with management.  We would like to   thank Dr. Jonelle Sidle for this referral.            ______________________________   Laban Emperor, ANP-BC         ______________________________   R. Garfield Cornea, M.D.            AS/MEDQ  D:  03/07/2010  T:  03/07/2010  Job:  [id number]      Electronically Signed by Laban Emperor  on 03/13/2010 03:59:13 PM   Electronically Signed by Jannette Spanner M.D. on 03/15/2010 02:58:59 PM

## 2010-05-14 NOTE — Progress Notes (Signed)
Summary: [name]: GI Office Visit  [facility]: Office Visit   Imported By: Roddie Mc 05/05/2010 11:21:16  _____________________________________________________________________  External Attachment:    Type:   Image     Comment:   External Document

## 2010-06-02 LAB — BASIC METABOLIC PANEL
BUN: 13 mg/dL (ref 6–23)
BUN: 13 mg/dL (ref 6–23)
BUN: 4 mg/dL — ABNORMAL LOW (ref 6–23)
CO2: 33 mEq/L — ABNORMAL HIGH (ref 19–32)
Calcium: 8.2 mg/dL — ABNORMAL LOW (ref 8.4–10.5)
Chloride: 105 mEq/L (ref 96–112)
Creatinine, Ser: 0.95 mg/dL (ref 0.4–1.2)
GFR calc Af Amer: >60 mL/min (ref >60)
GFR calc Af Amer: >60 mL/min (ref >60)
GFR calc Af Amer: >60 mL/min (ref >60)
GFR calc non Af Amer: 54 mL/min — ABNORMAL LOW (ref >60)
Potassium: 3.5 mEq/L (ref 3.5–5.1)
Potassium: 3.8 mEq/L (ref 3.5–5.1)
Sodium: 137 mEq/L (ref 135–145)

## 2010-06-02 LAB — CBC
HCT: 31.2 % — ABNORMAL LOW (ref 36.0–46.0)
HCT: 31.5 % — ABNORMAL LOW (ref 36.0–46.0)
HCT: 32.5 % — ABNORMAL LOW (ref 36.0–46.0)
HCT: 34 % — ABNORMAL LOW (ref 36.0–46.0)
HCT: 34.6 % — ABNORMAL LOW (ref 36.0–46.0)
MCH: 30.1 pg (ref 26.0–34.0)
MCHC: 33 g/dL (ref 30.0–36.0)
MCHC: 33.5 g/dL (ref 30.0–36.0)
MCHC: 33.6 g/dL (ref 30.0–36.0)
MCV: 90.4 fL (ref 78.0–100.0)
MCV: 90.5 fL (ref 78.0–100.0)
MCV: 90.5 fL (ref 78.0–100.0)
Platelets: 324 10*3/uL (ref 150–400)
RBC: 3.45 MIL/uL — ABNORMAL LOW (ref 3.87–5.11)
RBC: 3.59 MIL/uL — ABNORMAL LOW (ref 3.87–5.11)
RBC: 3.77 MIL/uL — ABNORMAL LOW (ref 3.87–5.11)
RBC: 3.82 MIL/uL — ABNORMAL LOW (ref 3.87–5.11)
RBC: 3.83 MIL/uL — ABNORMAL LOW (ref 3.87–5.11)
RDW: 12.7 % (ref 11.5–15.5)
RDW: 13.2 % (ref 11.5–15.5)
RDW: 13.5 % (ref 11.5–15.5)
RDW: 13.7 % (ref 11.5–15.5)
WBC: 3.5 10*3/uL — ABNORMAL LOW (ref 4.0–10.5)
WBC: 4.9 10*3/uL (ref 4.0–10.5)
WBC: 4.9 10*3/uL (ref 4.0–10.5)

## 2010-06-02 LAB — DIFFERENTIAL
Basophils Absolute: 0 10*3/uL (ref 0.0–0.1)
Basophils Absolute: 0 10*3/uL (ref 0.0–0.1)
Basophils Relative: 0 % (ref 0–1)
Basophils Relative: 0 % (ref 0–1)
Basophils Relative: 0 % (ref 0–1)
Basophils Relative: 0 % (ref 0–1)
Eosinophils Absolute: 0 10*3/uL (ref 0.0–0.7)
Eosinophils Absolute: 0 10*3/uL (ref 0.0–0.7)
Eosinophils Relative: 0 % (ref 0–5)
Lymphocytes Relative: 10 % — ABNORMAL LOW (ref 12–46)
Lymphocytes Relative: 15 % (ref 12–46)
Lymphocytes Relative: 18 % (ref 12–46)
Lymphs Abs: 0.7 10*3/uL (ref 0.7–4.0)
Lymphs Abs: 2.1 10*3/uL (ref 0.7–4.0)
Monocytes Absolute: 0.2 10*3/uL (ref 0.1–1.0)
Monocytes Absolute: 0.3 10*3/uL (ref 0.1–1.0)
Monocytes Absolute: 0.4 10*3/uL (ref 0.1–1.0)
Monocytes Absolute: 0.5 10*3/uL (ref 0.1–1.0)
Monocytes Relative: 5 % (ref 3–12)
Monocytes Relative: 6 % (ref 3–12)
Monocytes Relative: 9 % (ref 3–12)
Neutro Abs: 2.6 10*3/uL (ref 1.7–7.7)
Neutrophils Relative %: 66 % (ref 43–77)
Neutrophils Relative %: 73 % (ref 43–77)
Neutrophils Relative %: 83 % — ABNORMAL HIGH (ref 43–77)

## 2010-06-02 LAB — COMPREHENSIVE METABOLIC PANEL
ALT: 10 U/L (ref 0–35)
ALT: 8 U/L (ref 0–35)
AST: 17 U/L (ref 0–37)
AST: 17 U/L (ref 0–37)
Albumin: 3.2 g/dL — ABNORMAL LOW (ref 3.5–5.2)
Alkaline Phosphatase: 40 U/L (ref 39–117)
Alkaline Phosphatase: 44 U/L (ref 39–117)
CO2: 27 mEq/L (ref 19–32)
CO2: 27 mEq/L (ref 19–32)
Chloride: 104 mEq/L (ref 96–112)
Creatinine, Ser: 0.98 mg/dL (ref 0.4–1.2)
Creatinine, Ser: 1.03 mg/dL (ref 0.4–1.2)
GFR calc non Af Amer: 53 mL/min — ABNORMAL LOW (ref >60)
GFR calc non Af Amer: 56 mL/min — ABNORMAL LOW (ref >60)
Glucose, Bld: 108 mg/dL — ABNORMAL HIGH (ref 70–99)
Glucose, Bld: 141 mg/dL — ABNORMAL HIGH (ref 70–99)
Potassium: 3.8 mEq/L (ref 3.5–5.1)
Sodium: 136 mEq/L (ref 135–145)
Total Bilirubin: 0.8 mg/dL (ref 0.3–1.2)
Total Protein: 5.7 g/dL — ABNORMAL LOW (ref 6.0–8.3)

## 2010-06-02 LAB — HEMOGLOBIN A1C
Hgb A1c MFr Bld: 6.2 % — ABNORMAL HIGH (ref <5.7)
Mean Plasma Glucose: 131 mg/dL — ABNORMAL HIGH (ref <117)

## 2010-06-02 LAB — STOOL CULTURE

## 2010-06-02 LAB — OVA AND PARASITE EXAMINATION

## 2010-06-02 LAB — POTASSIUM: Potassium: 3.7 mEq/L (ref 3.5–5.1)

## 2010-06-09 ENCOUNTER — Ambulatory Visit (INDEPENDENT_AMBULATORY_CARE_PROVIDER_SITE_OTHER): Payer: Self-pay | Admitting: Internal Medicine

## 2010-08-05 NOTE — H&P (Signed)
[name], [name]                ACCOUNT NO.:  [id number]   MEDICAL RECORD NO.:  [id number]          PATIENT TYPE:  EMS   LOCATION:  MAJO                         FACILITY:  [place]   PHYSICIAN:  Cristopher Estimable. Lattie Haw, MD, FACCDATE OF BIRTH:  05/28/1940   DATE OF ADMISSION:  03/03/2007  DATE OF DISCHARGE:                              HISTORY & PHYSICAL   PRIMARY CARDIOLOGIST:  Minus Breeding, MD, [facility]   PRIMARY CARE PHYSICIAN:  Chipper Herb, M.D.   GASTROENTEROLOGIST:  Milus Banister, M.D.   HISTORY OF PRESENT ILLNESS:  This is a 70 year old Caucasian female with  no known cardiac history with complaints of chest pain since Sunday  which she describes as constant, stabbing, radiating to the back, left  arm, and right jaw while at Sunday school party.  The patient felt  weakness.  The pain has not subsided since Sunday with the exception of  taking Pepto-Bismol, Pepcid, and aspirin and the pain was diminished for  approximately 30 minutes, but then recurred and became more intense.  Yesterday the patient had one episode of nausea and vomiting x1.  She  decided to lay down and has had no energy every since.  She got tired of  the pain and when she was seen by her physician at [facility] for an appointment for blood work which was already  scheduled, she told them about her symptoms and they suggested that she  come to the emergency room.  EMS was called and she was brought to the  ER at [facility].  The patient has not felt this type of pain  before.  When asked about associated symptoms of shortness of breath,  dizziness, diaphoresis, or chills her response is a little.   REVIEW OF SYSTEMS:  Positive for chest pain, shortness of breath,  dizziness, and nausea and vomiting x1 yesterday.  All other symptoms  reviewed and are negative.   PAST MEDICAL HISTORY:  Hypertension, dyslipidemia, ulcerative colitis  diagnosed in March of 2008.  The patient  had a nuclear stress test one  year ago at Dr. Rosezella [place] office and was found to be normal.   PAST SURGICAL HISTORY:  Hysterectomy, cholecystectomy.   CURRENT MEDICATIONS:  1. Azathioprine 50 mg two tablets once a day.  2. Diovan hydrochlorothiazide 150/12.5 mg daily.  3. Pepto-Bismol p.r.n.  4. Aspirin 81 mg once a day.  5. Tylenol p.r.n.   ALLERGIES:  PENICILLIN, MYCIN, SULFA, CODEINE, BETADINE, TAPE.   FAMILY HISTORY:  The mother is deceased with MI, father deceased with  MI, brother with MI and coronary artery bypass grafting.   SOCIAL HISTORY:  The patient lives in [place], she lives alone.  She is  retired.  She does not smoke and does not drink alcohol.  She has good  family support.   LABORATORY DATA:  Sodium 136, potassium 3.8, chloride 103, CO2 25, BUN  13, creatinine 0.98, glucose 114, hemoglobin 12.5, hematocrit 36.9,  white blood cell count 2.4, platelets 304.  Amylase is pending, lipase  18.  Cardiac enzymes; troponin 0.02, MB 0.3,  CK 20.   EKG revealing normal sinus rhythm, ventricular rate in 70's to 90's.   Chest x-ray reveals left lower lobe atelectasis versus infiltrate.   PHYSICAL EXAMINATION:  VITAL SIGNS:  Blood pressure 144/86, pulse 94,  respirations 26, temperature 98.2, O2 saturation 96% on 2 liters.  HEENT:  Head is normocephalic and atraumatic.  Eyes; PERRLA.  Mucous  membranes and mouth pink and moist.  Tongue is midline.  NECK:  Supple without JVD or carotid bruits appreciated.  CARDIOVASCULAR:  Regular rate and rhythm without murmurs, rubs, or  gallops.  LUNGS:  Clear to auscultation.  ABDOMEN:  Soft.  There is some tenderness in the left upper quadrant on  palpation.  Normal bowel sounds.  EXTREMITIES:  Without cyanosis, clubbing, or edema.  NEUROLOGY:  Cranial nerves II-XII grossly intact.   IMPRESSION:  1. Atypical chest pain.  2. History of hypertension.  3. History of ulcerative colitis.  4. Leukopenia.   PLAN:  The patient has  been seen and examined by myself and Dr.  Lattie Haw.  The patient's old records have been reviewed along with  stress test which was sent over from our office revealing normal one  year ago.  Cardiac enzymes were found to be normal.  EKG is found to be  normal.   She does have some symptoms worrisome for CAD, especially with radiation  to the jaw, but predominantly the pain is atypical and more related to  GI in etiology.  We will admit the patient, rule out a myocardial  infarction, and obtain GI consult as she is well known to Dr. Ardis Hughs.  We will defer restarting Azathioprine until they see her concerning her  leukopenia as a result and let them adjust dose if necessary.  The  patient will be placed on clear liquids and IV fluids until their  assessment and we will place the patient on proton pump inhibitor,  restart Diovan and aspirin, and  follow making further recommendations.  Echocardiogram will be also  completed.  We will hemoccult stools and check LFT's.  We will make  further recommendations throughout hospital course depending on the  patient's response to treatment.      Phill Myron. Purcell Nails, NP      Milner Lattie Haw, MD, [facility]  Electronically Signed    KML/MEDQ  D:  03/03/2007  T:  03/03/2007  Job:  [id number]   cc:   Milus Banister, MD  Chipper Herb, M.D.

## 2010-08-05 NOTE — Assessment & Plan Note (Signed)
[facility] OFFICE NOTE   NAME:[name], [name]                         MRN:          [id number]  DATE:09/07/2006                            DOB:          04/21/1940    GI PROBLEM LIST:  1. Left-sided ulcerative colitis.  Colonoscopy March 2008 by Dr. Vickii Chafe confirmed moderate colitis to the transverse colon.      Biopsies showed chronic changes, no signs of Crohn's.  The patient      responded to inpatient IV steroids.  Tapering off of prednisone      well.  June 2008 off prednisone, and doing well.   INTERVAL HISTORY:  I last saw [name] 1 month ago.  Since then, she has  been able to taper completely off steroids.  She is on Colazal 3 pills 3  times a day, as well as a Canasa suppository nightly.  On this regimen,  she is having on average 2 soft semi-formed bowel movements daily that  are non-bloody.  Over the weekend, she did start noticing some  midabdominal discomforts, as well as some left-sided abdominal  discomforts.  She admits she has been eating a lot more salads, and a  lot of seafood over the weekend, and thinks that may be contributing.  She has noticed no change in her bowel movements, and certainly no  bleeding.  Her fatigue is improving.  She is almost back to her  baseline.   CURRENT MEDICATIONS:  1. Multivitamin.  2. Vitamin E.  3. Vitamin B-12.  4. Canasa suppository nightly.  5. Colazal 750 mg t.i.d.  6. Diovan/hydrochlorothiazide.   PHYSICAL EXAMINATION:  Weight 162 pounds, which is up 3 pounds since her  last visit.  Blood pressure 122/76.  Pulse 72.  CONSTITUTIONAL:  Generally well appearing.  ABDOMEN:  Soft, non-tender, and non-distended.  Normal bowel sounds.   ASSESSMENT AND PLAN:  A 70 year old woman with left-sided ulcerative  colitis to the transverse colon.  She is doing well off steroids for the  past 2 weeks.  She is currently maintained on oral mesalamine and  anal  mesalamine suppository.  She will continue this regimen for now.  It is  not clear that the discomfort she had over the past 2 to 3 days are  dietary related, or truly from mild worsening of her inflammatory bowel  disease.  I think we will just keep  a close eye on her.  She will return to see me in 6 weeks, and sooner if  needed.  She knows that if she starts to have a declining trend to call  sooner than that.     Milus Banister, MD  Electronically Signed    DPJ/MedQ  DD: 09/07/2006  DT: 09/07/2006  Job #: [id number]   cc:   Olivia Mackie, MD

## 2010-08-05 NOTE — Consult Note (Signed)
[name], [name]                ACCOUNT NO.:  [id number]   MEDICAL RECORD NO.:  [id number]          PATIENT TYPE:  END   LOCATION:  DAY                           FACILITY:  [facility]   PHYSICIAN:  R. Garfield Cornea, M.D. DATE OF BIRTH:  1941/02/08   DATE OF CONSULTATION:  DATE OF DISCHARGE:                                 CONSULTATION   REQUESTING PHYSICIAN:  Mercie Eon, NP   REASON FOR CONSULTATION:  Second opinion regarding ulcerative colitis  management.   HISTORY OF PRESENT ILLNESS:  [name] is a 70 year old Caucasian  female who presents for a second opinion regarding her management of  ulcerative colitis.  She tells me she was diagnosed with UC in March  2008.  She had initially been told prior to this that she had  diverticulosis.  She tells me she had a colonoscopy by Dr. Velora Heckler,  which showed this.  She then was diagnosed with upper respiratory  infection, gastroenteritis, and was undergoing a significant amount of  stress at work.  She was then hospitalized for 7 days and underwent  multiple studies including a second colonoscopy, which was incomplete as  well as the third colonoscopy and was told that she had ulcerative  colitis.  She does not believe that any of her previous colonoscopies  were complete.  She has been tried on multiple modalities for her  ulcerative colitis including mesalamine products.  She believes she has  tried Asacol.  She is currently on Colazal.  She is also on prednisone  20 mg daily since last hospitalization earlier this month.  She has been  using Rowasa 4 g enemas at bedtime as well.  Approximately 2 weeks ago,  she was hospitalized at [facility] for 6 days.  She was having severe  postprandial diarrhea with intermittent blood per rectum.  She was  having 4 to 20+ stools a day.  She was having significant urgency where  she had to have a bowel movement about 1 to 2 hours after eating.  She  notices mucus in her stools.  She complains  of intermittent left lower  quadrant pain.  She is now having anywhere from 3 to 10 bowel movements  per day, which is mildly better than when she was hospitalized.  She  complains of chills and weakness.  Occasionally, she runs low-grade  fevers.  She has not been on any antibiotics recently.  Denies any NSAID  use except for a baby aspirin daily.  Her weight is up about 20 pounds  in the last year.  Last colonoscopy was performed on April 27, 2007,  by Dr. Owens Loffler.  She was found to have erosion and granularity  present with spontaneous hemorrhage due to friability.  She had severe  superficial ulcers present.  She had inflammation up to 30 cm from the  anus with abrupt transition point to normal mucosa.  Biopsies showed  increased inflammation within the lamina propria associated with  neutrophilic cryptitis and crypt distortion.  Many of the crypts had  nuclear stratification, increased numbers of mitotic figures, findings  which were atypical and indeterminate for low-grade glandular dysplasia.  There was no evidence of high-grade dysplasia.  She has previously  failed azathioprine due to chest pain, nausea, and vomiting.   LABORATORY STUDIES:  From hospitalization on July 08, 2007, show a CMP  which was normal except for glucose of 166, total protein of 5.7, and  albumin of 3.2.  Otherwise normal LFTs.  She had a white blood cell  count of 7.3, hemoglobin 12, hematocrit 35.3, and platelets 266.  She  had negative stool for ova and parasites.  She had a hemoglobin A1c of  6.8.  Negative stool for C. diff.  She had a sed rate of 21.  She had a  normal TSH.  She had negative stool cultures.  She had negative E. coli.   PAST MEDICAL AND SURGICAL HISTORY:  1. Ulcerative colitis diagnosed March 2008.  2. Diverticulosis.  3. Hypertension.  4. Hyperlipidemia.  5. Cholecystectomy.  6. Hysterectomy and bilateral oophorectomy.   She tells me she had a DEXA scan, which showed  mild osteopenia.  Through  her primary care physician, she is taking supplemental calcium.   CURRENT MEDICATIONS:  1. Balsalazide disodium 750 mg 3 p.o. t.i.d.  2. Florajen-3 three p.o. daily.  3. Prednisone 20 mg daily decreased to 10 mg tomorrow.  4. Darvocet-N 100 p.r.n.  5. Aspirin 81 mg daily.  6. Diovan HCT 160/12.5 mg daily.  7. Rowasa enemas 4 g nightly.  8. Dibucaine ointment q.12 h.  9. Calcium carbonate 500 mg daily.  10.Tylenol p.r.n.   ALLERGIES:  PENICILLIN, MYCIN, CODEINE, SULFA, AZATHIOPRINE caused  nausea, vomiting, and chest pain, BETADINE, and LATEX.   FAMILY HISTORY:  Positive for 3 maternal aunts with colon cancer  diagnosed in their 55s.  Her mother also had colonic polyps, deceased at  age 72 secondary to MI.  Father deceased at 7 secondary to MI as well.  Her sister has a history of diabetes mellitus and coronary artery  disease as does her brother.   SOCIAL HISTORY:  [name] is a widow.  She lives alone.  She is a  retired Occupational hygienist.  She denies any tobacco or drug use.  She  consumes about 2 glasses of wine per month.   REVIEW OF SYSTEMS:  See HPI.  GI:  She rarely has heartburn or  indigestion.  Denies any nausea or vomiting, dysphagia, odynophagia,  anorexia, or early satiety.  Otherwise, negative review of systems.  See  HPI.   PHYSICAL EXAMINATION:  VITAL SIGNS:  Weight 167 pounds, height 58  inches, temp 98.4, blood pressure 130/82, and pulse 72.  GENERAL:  [name] is a well-developed, well-nourished Caucasian  female, in no acute distress.  HEENT:  Sclerae are nonicteric.  Conjunctivae pink.  Oropharynx is pink  and moist without any lesions.  NECK:  Supple.  She does have a left supraclavicular palpable fullness.  CHEST:  Heart regular rate and rhythm.  Normal S1, S2, without murmurs,  clicks, rubs, or gallops.  LUNGS:  Clear to auscultation bilaterally.  ABDOMEN:  Positive bowel sounds x4.  No bruits auscultated.  Soft,   nontender, and nondistended without palpable mass or hepatosplenomegaly.  No rebound, tenderness, or guarding.  EXTREMITIES:  Without clubbing or edema bilaterally.  SKIN:  Pink, warm, and dry without rash or jaundice.   IMPRESSION:  Ms. Santellan is a 70 year old female with history of  refractory ulcerative colitis.  Based on what she is telling me today,  she has never attained complete remission from her disease.  She is here  for a second opinion regarding management of her condition.  I find two  things rather concerning today; one being the fact that she has not had  a complete colonoscopy and the cecum has not been visualized due to  tortuosity, looping, and extreme discomfort.  Second cause of concern  would be her atypia found on recent colonoscopy.  She is going to  require frequent surveillance if her left colon remains in situ.  Depending on the severity of her disease and whether she fails medical  management, she may require colectomy.  This case has been discussed  with Dr. Gala Romney.   PLAN:  1. Complete colonoscopy by Dr. Gala Romney with multiple biopsies to be      obtained.  Hopefully, he will be able to reach her cecum for a      complete exam.  He will use pediatric scope.  Random biopsies will      be obtained to evaluate for dysplasia.  She is to continue on      Colazal 750 mg 3 p.o. t.i.d. as well as prednisone taper as      directed.  She can continue Rowasa enemas until finished as well.  2. Further recommendations will be made, pending colonoscopy findings.   Thank you, Mercie Eon, NP, for asking me to participate in the care  of Ms. Virts.      Vickey Huger, N.P.      Bridgette Habermann, M.D.  Electronically Signed    KJ/MEDQ  D:  07/21/2007  T:  07/22/2007  Job:  [id number]

## 2010-08-05 NOTE — Assessment & Plan Note (Signed)
[facility] OFFICE NOTE   NAME:[name], [name]                         MRN:          [id number]  DATE:05/25/2007                            DOB:          08/13/1940    PRIMARY CARE:  [name] Eon, NP.   REASON FOR PRESENTATION:  Evaluate patient with hypertension.   HISTORY OF PRESENT ILLNESS:  Patient is a 70 year old white female with  hospitalization in December with chest pain.  It was felt be nonanginal.  She thinks it was related to azathioprine.  She is being followed back  today because of high blood pressure.  I had her keep a blood pressure  diary.  This actually shows that the pressures are typically below  140/90.  She occasionally has some spikes of systolics in the 242P or  even 160s.  However, this is uncommon.  She has frequent systolic blood  pressures in the 120s/70s.  She is not having any cardiovascular  symptoms.  She is still bothered by lots of GI complaints and is  following closely with Dr. Ardis Hughs.   PAST MEDICAL HISTORY:  1. Hypertension.  2. Dyslipidemia.  3. Ulcerative colitis.  4. Hysterectomy.  5. Cholecystectomy.   ALLERGIES:  1. PENICILLIN.  2. CODEINE.  3. SULFA.  4. BETADINE.  5. She also seems to be intolerant of AZATHIOPRINE.   MEDICATIONS:  1. Diovan/HCT 160/12.5 daily.  2. Aspirin 81 mg daily.  3. Colazal.  4. Mesalamine.  5. Prednisone 10 mg daily.   REVIEW OF SYSTEMS:  As stated in the HPI, otherwise negative for other  systems.   PHYSICAL EXAMINATION:  GENERAL:  The patient is in no distress.  VITAL SIGNS:  Blood pressure 124/80, heart rate 84 and regular.  HEENT:  Eyes are unremarkable.  Pupils are equal, round and react to  light.  Fundi not visualized.  NECK:  No jugular distention, waveform within normal limits.  Carotid  upstroke brisk and symmetrical.  No bruits, thyromegaly.  LYMPHATICS:  No adenopathy.  LUNGS:  Clear to auscultation  bilaterally.  BACK:  No costovertebral angle tenderness.  CHEST:  Unremarkable.  HEART:  PMI not displaced or sustained, S1-S2 within normal limits.  No  N3-I1, no clicks, rubs, murmurs.  ABDOMEN:  Obese, positive bowel sounds.  Normal in frequency and pitch.  No bruits, rebound, guarding or midline pulsatile mass, organomegaly.  SKIN:  No rashes.  EXTREMITIES:  2+ pulses, no edema.   DIAGNOSTICS:  EKG:  Sinus rhythm, rate 84, rightward axis, intervals  within normal limits, no acute ST-T wave changes.   ASSESSMENT:  1. Hypertension.  Blood pressure is well controlled.  At this point, I      will make no change to her medical regimen.  2. Chest discomfort.  This is atypical.  There is no clear anginal      etiology.  No further workup is planned.   FOLLOW UP:  I will see the patient back as needed.     Minus Breeding, MD, [facility]  Electronically Signed    JH/MedQ  DD:  05/25/2007  DT: 05/25/2007  Job #: [id number]

## 2010-08-05 NOTE — Assessment & Plan Note (Signed)
[facility] OFFICE NOTE   NAME:[name], [name]                         MRN:          [id number]  DATE:11/19/2006                            DOB:          11/27/1940    GI PROBLEM LIST:  1. Left-sided ulcerative colitis.  Colonoscopy March 2008 by Dr. Vickii Chafe confirmed moderate colitis to the transverse colon.      Biopsies showed chronic changes, no signs of Crohn's.  The patient      responded to inpatient IV steroids.  Tapering off of prednisone      well.  June 2008 off prednisone, and doing well.  Maintaining on      Colazal 6.75 g daily.  Changed from Colazal to Lialda 4.8 grams      daily, July 2008.   INTERVAL HISTORY:  I last saw [name] 1 month ago, at that point she was  still having 2-4 soft bowel movements a day and my thought was to change  her to a different mesalamine delivery to see if we can get this down a  little bit more normal so we changed her to Lialda 4.8 grams daily.  She  says since then she is going 2-3 times a day, soft, sometimes loose but  usually formed.  She has had some more abdominal discomforts lately.  She is having some periumbilical discomforts that are usually improved  with a bowel movement.  She has a left lower quadrant discomfort that  does not seem to be improved with a bowel movement.  She has had no  fevers or chills.  She has also had some anal/rectal discomfort.  She  has had no bleeding.   CURRENT MEDICATIONS:  1. Lialda 4.8 grams daily.  2. Diovan hydrochlorothiazide.  3. Canasa suppositories.  4. B-12.  5. Vitamin E.  6. Multivitamin.   PHYSICAL EXAMINATION:  Weight 164 pounds, she is down 2 pounds since her  last visit.  Blood pressure 138/80, pulse 72.  CONSTITUTIONAL:  Generally well-appearing.  ABDOMEN:  Soft, mildly tender in the left lower quadrant, no peritoneal  signs.  ANAL/RECTAL:  Normal with female CMA in room, found normal  anal/rectal  examination, no blood in vault, no internal or external hemorrhoids  palpated, no masses.   ASSESSMENT/PLAN:  A 70 year old woman with left-sided ulcerative colitis  to the transverse colon.  I will call in a prescription for  antispasmodics that she will take as needed for what may be spasms and  cramps in her bowels, concerned mostly about this left lower quadrant  discomfort and so I am getting a CBC, comprehensive metabolic profile as  well as a sed rate.  She will stop the Canasa suppositories right now as  maybe she is having some local irritation from those causing these anal  discomforts.  She will return to see me in 2-3 weeks and sooner if  needed.  If her pains get worse I would arrange for her to have a CT  scan to make sure there  are no dramatic complications and if  that looks normal then I would  consider changing her back to Colazal as these new discomforts did seem  to happen after she changed to [place].     Milus Banister, MD  Electronically Signed    DPJ/MedQ  DD: 11/19/2006  DT: 11/20/2006  Job #: [id number]   cc:   [name] Eon, MD

## 2010-08-05 NOTE — Assessment & Plan Note (Signed)
[name]   NAME:REAVESMayumi, [name]                         MRN:          [id number]  DATE:01/07/2007                            DOB:          01/18/1941    PRIMARY CARE PHYSICIAN:  Mercie Eon, FNP.   GI PROBLEM LIST:  1. Left-sided ulcerative colitis.  Colonoscopy March 2008 by Dr. Vickii Chafe confirmed moderate colitis to the transverse colon.      Biopsies showed chronic changes, no signs of Crohn's.  The patient      responded to inpatient IV steroids.  Tapering off of prednisone      well.  June 2008 off prednisone, and doing well.  Maintaining on      Colazal 6.75 g daily.  Changed from Colazal to Lialda 4.8 grams      daily, July 2008.  Chronic intermittent left lower quadrant pains      with a history of diverticulosis.  Symptoms seem to be worsening      and so a CT scan performed December 01, 2006, with IV and oral      contrast.  There was focal thickening of the sigmoid colon,      otherwise essentially normal.   INTERVAL HISTORY:  I last saw [name] 2 weeks ago.  She was actually doing  fairly well except for some mild left lower quadrant discomfort that is  likely related to her left-sided colitis.  She was having 2 to 3 soft  bowel movements a day.  Since then, her bowel habits have definitely  worsened.  She is going 7 to 8 times a day.  Her left-sided discomforts  are also worse.  She is having no fevers or chills.  Eating definitely  makes her bowels more active.  She saw a little bit of blood, but not  much, in the past week or so.   CURRENT MEDICATIONS:  1. Multivitamin.  2. Vitamin E.  3. Vitamin B12.  4. Diovan.  5. Hydrochlorothiazide.  6. Lialda 4.8 g daily.   PHYSICAL EXAM:  Weight 165 pounds, which is down 1 pound since her last  visit.  Blood pressure 130/80, pulse 80.  CONSTITUTIONAL:  Generally well-appearing.  ABDOMEN:  Soft.  Mildly tender in the left  lower quadrant.  Normal bowel  sounds.  No peritoneal signs.   ASSESSMENT AND PLAN:  A 70 year old woman with left-sided colitis to the  transverse colon.   Lialda 4.8 g daily did not seem to be maintaining her.  I think she is  flaring with her colitis.  She will get a basic set of labs today,  including CBC, comprehensive metabolic profile, and a sed rate.  I am  calling her in a prescription for prednisone.  She will take 20 mg twice  daily.  I am also going to switch her from Lialda 4.8 g a day back to  Asacol, which she will take 3 times a day at max dose.  Perhaps the  delivery method of the Lialda mesalamine is what the problem is.  She will return to see me in 2 weeks and sooner if needed.  She knows I  am on call this weekend.  She will get in touch if she has any troubles.     Milus Banister, MD  Electronically Signed    DPJ/MedQ  DD: 01/07/2007  DT: 01/08/2007  Job #: [id number]   cc:   Mercie Eon, FNP

## 2010-08-05 NOTE — Assessment & Plan Note (Signed)
[facility] OFFICE NOTE   NAME:[name], [name]                         MRN:          [id number]  DATE:04/25/2008                            DOB:          01/13/1941    PRIMARY CARE PHYSICIAN:  Mercie Eon, Nurse Practitioner.   REASON FOR PRESENTATION:  Evaluate the patient with hypertension.   HISTORY OF PRESENT ILLNESS:  The patient returns for yearly followup.  She is now 70 years old.  She was in the hospital in the emergency room  in December with chest discomfort.  I reviewed these records.  They  thought she had musculoskeletal discomfort.  She had a CT which was  negative for any evidence of acute process.  She has since had some  sternal discomfort that she relates to her fibromyalgia.  It hurts when  she moves or presses in a certain area.  She has not had any substernal  chest pressure, neck, or arm discomfort.  She has not had any new  shortness of breath, PND, or orthopnea.  She has had no palpitations,  presyncope, or syncope.  Her blood pressure has been well controlled.  She is having lots of problems with her ulcerative colitis.   PAST MEDICAL HISTORY:  1. Hypertension.  2. Dyslipidemia.  3. Ulcerative colitis.  4. Hysterectomy.  5. Cholecystectomy.   ALLERGIES:  PENICILLIN, CODEINE, SULFA, BETADINE, and AZATHIOPRINE.   MEDICATIONS:  1. Diovan HCT 160/12.5 daily.  2. Probiotic.  3. Prednisone 10 mg daily.  4. Calcium.  5. Colazal.  6. Canasa.  7. Remicade infusions.   REVIEW OF SYSTEMS:  As stated in the HPI and otherwise negative for  other systems.   PHYSICAL EXAMINATION:  GENERAL:  The patient is pleasant and in no  distress.  VITAL SIGNS:  Blood pressure 114/84, heart rate 94 and regular, weight  170, and body mass index 26.  NECK:  No jugular venous distention at 45 degrees.  Carotid upstroke  brisk and symmetric.  No bruits.  No thyromegaly.  LYMPHATICS:  No adenopathy.  LUNGS:  Clear to auscultation bilaterally.  BACK:  No costovertebral angle tenderness.  CHEST:  Unremarkable.  HEART:  PMI not displaced or sustained.  S1 and S2 within normal limits.  No S3, no S4.  No clicks, no rubs, no murmurs.  ABDOMEN:  Flat, positive bowel sounds, normal in frequency and pitch.  Mild tenderness to mild palpation.  No rebound, no guarding.  No  hepatomegaly.  No splenomegaly.  SKIN:  No rashes, no nodules.  EXTREMITIES:  2+ pulses, no edema.   EKG, sinus rhythm, rate 94, axis within normal limits, intervals within  normal limits, nonspecific T-wave flattening.   ASSESSMENT AND PLAN:  1. Hypertension.  Blood pressure is well controlled.  She tolerates      her Diovan HCT and should continue on this.  I renewed her      prescription.  No further evaluation is warranted.  2. Chest discomfort.  This is atypical.  She had a workup in the ER  with no evidence of ischemia.  It is reproducible with palpation.      No further evaluation is warranted.  3. Followup.  She can come back to this clinic as needed.     Minus Breeding, MD, [facility]  Electronically Signed    JH/MedQ  DD: 04/25/2008  DT: 04/26/2008  Job #: [id number]   cc:   Mercie Eon, NP

## 2010-08-05 NOTE — Assessment & Plan Note (Signed)
[facility] OFFICE NOTE   NAME:REAVESBrynja, Marker                         MRN:          [id number]  DATE:01/21/2007                            DOB:          1941-01-13    PRIMARY CARE PHYSICIAN:  Mercie Eon, FNP.   GI PROBLEM LIST:  1. Left-sided ulcerative colitis.  Colonoscopy March 2008 by Dr. Vickii Chafe confirmed moderate colitis to the transverse colon.      Biopsies showed chronic changes, no signs of Crohn's.  The patient      responded to inpatient IV steroids.  Tapering off of prednisone      well.  June 2008 off prednisone, and doing well.  Maintaining on      Colazal 6.75 g daily.  Changed from Colazal to Lialda 4.8 grams      daily, July 2008.  Chronic intermittent left lower quadrant pains      with a history of diverticulosis.  Symptoms seem to be worsening      and so a CT scan performed December 01, 2006, with IV and oral      contrast.  There was focal thickening of the sigmoid colon,      otherwise essentially normal.  Flare October 2008, frequency, left      lower quadrant pain.  CBC normal.  Changed from Lialda to Asacol      and started prednisone 40 mg a day.   INTERVAL HISTORY:  I last saw [name] 2 weeks ago.  Since then, she  started her prednisone about a week later than I had hoped due to  miscommunication over her prednisone dose.  Since starting prednisone 4  to 5 days ago, she has noticed an improvement in her frequency.  THE  pain is improving too.   CURRENT MEDICATIONS:  1. Asacol 400 mg t.i.d.  2. Diovan.  3. Hydrochlorothiazide.  4. Prednisone 40 mg a day.   PHYSICAL EXAM:  Weight 163 pounds, which is down 2 pounds since her last  visit.  Blood pressure 118/80, pulse 80.  CONSTITUTIONAL:  Generally well-appearing.  ABDOMEN:  Soft and mildly tender in the left lower quadrant.  Normal  bowel sounds.  No peritoneal signs.   ASSESSMENT AND PLAN:  A 70 year old  woman with left-sided colitis.   This is the second time we have had to have [name] on prednisone, and I  think I will get her phenotyped for TPMT enzymes and plan to begin her  on azathioprine at appropriate dose if genetically she will tolerate it.  She is to stay on prednisone 40 mg a day for now.  She will stay  on the Asacol dose as above.  She will return to see me in 3 to 4 weeks  and sooner if needed.  I do not want her to taper the prednisone yet.     [name] Banister, MD  Electronically Signed    DPJ/MedQ  DD: 01/21/2007  DT: 01/22/2007  Job #: [id number]   cc:   Mercie Eon, FNP

## 2010-08-05 NOTE — Assessment & Plan Note (Signed)
[facility] OFFICE NOTE   NAME:Kunka, Rane                         MRN:          [id number]  DATE:12/20/2006                            DOB:          1940/08/17    GI FOLLOW-UP NOTE:   GI PROBLEM LIST:  1. Left-sided ulcerative colitis.  Colonoscopy March 2008 by Dr. Vickii Chafe confirmed moderate colitis to the transverse colon.      Biopsies showed chronic changes, no signs of Crohn's.  The patient      responded to inpatient IV steroids.  Tapering off of prednisone      well.  June 2008 off prednisone, and doing well.  Maintaining on      Colazal 6.75 g daily.  Changed from Colazal to Lialda 4.8 grams      daily, July 2008.  Chronic intermittent left lower quadrant pains      with a history of diverticulosis.  Symptoms seem to be worsening      and so a CT scan performed December 01, 2006, with IV and oral      contrast.  There was focal thickening of the sigmoid colon,      otherwise essentially normal.   INTERVAL HISTORY:  I last saw Kitara one month ago.  Since then she did  have a CT scan and it showed some sigmoid thickening.  This is in the  area of her known left-sided colitis so it is not clear that this  represented diverticulitis.  She  has always been under the impression  that she has severe diverticulosis and diverticulitis and looking back  at her endoscopies, Dr. Lyla Son was never able to get past her  hepatic flexure due to what he called a thick left colon.  She did  have a barium enema to complete the look at her right colon.  Her left-  sided pains are intermittent.  They can last for 1-2 hours and sometimes  pain will shoot down her left leg.  They do not seem related to her  bowels.  They happen maybe once every 2-3 days and do not limit her.  She has had no fevers or chills.  She says her higher-up epigastric  discomfort is better since being on the antispasmodics.   CURRENT MEDICINES:  1. Lialda 4.8 g daily.  2. Diovan/hydrochlorothiazide.  3. Vitamin E.  4. Vitamin B12.   PHYSICAL EXAM:  Weight 166 pounds, up 2 pounds since her last visit.  Blood pressure 120/78, pulse 72.  CONSTITUTIONAL:  Generally well-appearing.  ABDOMEN:  Soft, nontender, nondistended.  Normal bowel sounds.   ASSESSMENT AND PLAN:  A 70 year old woman with left-sided colitis of the  transverse colon.   First, I do not think her left lower quadrant discomforts are  diverticulitis-related.  I think this is probably more symptomatic of  ulcerative colitis.  I did not mention above that she has on average 2-3  soft, formed bowel movements a day and is not bleeding.  My plan for now  is to  simply maintain her  on her current medicine, which are Lialda at full  dose 4.8 g daily.  Antispasmodics.  She will return to see me in 3  months' time and sooner if needed.     Milus Banister, MD  Electronically Signed    DPJ/MedQ  DD: 12/20/2006  DT: 12/20/2006  Job #: [id number]   cc:   Mercie Eon, NP

## 2010-08-05 NOTE — H&P (Signed)
[name], [name]                ACCOUNT NO.:  [id number]   MEDICAL RECORD NO.:  [id number]          PATIENT TYPE:  EMS   LOCATION:  ED                           FACILITY:  [facility]   PHYSICIAN:  Lowella Bandy. Olevia Perches, MD     DATE OF BIRTH:  03/20/1941   DATE OF ADMISSION:  07/04/2007  DATE OF DISCHARGE:                              HISTORY & PHYSICAL   CHIEF COMPLAINT:  Bloody diarrhea and left-sided abdominal pain, and  weakness.   HISTORY OF PRESENT ILLNESS:  [name] is a pleasant 70 year old white  female.  She has a history of left-sided ulcerative colitis.  Her latest  colonoscopy was April 27, 2007 by Dr. Owens Loffler.  On that study  day he found erosive friable mucosa that abruptly stopped at 30 cm and  beyond the 30 cm line the mucosa was normal.  She did have rectal  involvement with the colitis.  Pathology confirmed chronic active  colitis consistent with inflammatory bowel disease.  Noted was some  glandular atypia, but no high-grade dysplasia.  She has been followed up  as recently as early March in the office and has been compliant taking  Colazal 9 tablets daily divided into three doses, Rowasa enemas at  night.  She has not required prednisone within the recent past.  She has  been trialed on azathioprine, but this caused significant chest pain,  nausea and vomiting and was discontinued.  The patient called the office  today complaining of about 2 weeks of progressive bloody diarrhea with  left lower quadrant pain and pain in the left waist at the back.  She  has progressed to having up to 15 bowel movements of small quantity of  bloody material daily.  She has had fecal incontinence.  She has been  eating because she has been encouraged to do so, but she says that the  symptoms are worse after she eats.  There has been some nausea.  Within  the last 24 hours she is pretty weak and says that she was near-syncopal  today because she got dizzy when she stood up.  For  the pain the patient  has been successfully treating this with p.r.n. Darvocet and she took  about 2 of these yesterday, but mostly she has been using Extra Strength  Tylenol which provided some relief as well to the pain.   The patient called the office and was advised to come to the emergency  room at [facility] where she is now interviewed.   ALLERGIES:  TO LASIX, PENICILLIN, SULFA, CODEINE, BETADINE, ERYTHROMYCIN  AND INTOLERANCE TO AZATHIOPRINE.   CURRENT MEDICATIONS:  1. Colazal 750 mg three p.o. t.i.d.  2. Diovan/hydrochlorothiazide 160/12.5 mg once daily.  3. Mesalamine enemas at night.  4. Darvocet-N 100 p.r.n.  5. Tylenol Extra Strength p.r.n.   PAST MEDICAL HISTORY:  1. Left-sided ulcerative colitis.  2. Diverticulosis.  3. Hypertension.  4. Hyperlipidemia.  5. Noncardiac chest pain, December of 2008.  6. Status post cholecystectomy.  7. Status post hysterectomy and bilateral oophorectomy.   SOCIAL HISTORY:  The patient  is widowed.  She lives in [place].  She has  a supportive family.  Has never used tobacco products.  Does not drink  alcohol products.   FAMILY HISTORY:  MI positive in her father and mother and brother all of  who are deceased.  The brother also was status post CABG.   REVIEW OF SYSTEMS:  As above.  Does complain of slight headache, denies  edema and swelling.  No oliguria.  No fever, some sweats.  Positive for  dry mouth.  No rash.  No pruritus.  Positive for pain in the legs and  pain in the rectum which seem to be associated because when she has a  bowel movement and has rectal pain it triggers pain in the legs.  No  nosebleeds.  No antibiotics within the last 3-4 months.  No URI or  cough.  No palpitations.  No seasonal allergies.  The patient denies  knowledge of prior transfusion with blood products.  Otherwise review of  systems negative.   LABORATORY:  Pending at this time are Sed rate, CBC and BMET along with  stool studies.    EXAMINATION:  The patient is an somewhat pale, unwell looking white  female in some distress.  Room air saturation 96%.  Blood pressure 161/91, pulse 99, respirations  20, temperature 98.3.  HEENT EXAM:  No pallor.  Extraocular movements are intact.  Oropharynx:  The mucous membranes are clear, but slightly dry.  NECK:  No masses, no JVD.  CHEST:  Clear to auscultation and percussion with excellent breath  sounds and no cough.  CARDIOVASCULAR:  There is a slightly tachycardiac, but regular rhythm.  No murmurs, rubs or gallops.  GI:  Abdomen is soft with some slight left-sided tenderness.  EXTREMITIES:  There is no cyanosis, clubbing or edema.  DERMATOLOGIC:  No rash.  No hyperemia.  HEMATOLOGIC:  No bruising and no telangiectasias.  NEUROLOGIC:  No tremors.  She is alert and oriented x3.  She walks  independently.  PSYCHIATRIC:  Her affect is somewhat flat consistent  with her feeling unwell.  She is appropriate and cooperative.  RECTAL EXAM:  Deferred as the patient was examined in a public area.  GU AND BREAST EXAMS:  Also deferred.   IMPRESSION:  1. Progressive bloody diarrhea probably secondary to flare of      ulcerative colitis in a patient who was previously stable on      Colazal and Rowasa enemas.  Rule out superimposed infection causing      her symptoms such as infectious colitis Clostridium difficile      included in this differential.  2. History of hypertension.  Blood pressure, diastolic a bit high, but      she appears somewhat dehydrated and we will hold off on allowing      her to resume her blood pressure medications for the time being.      Note that she did skip her dose of antihypertensive this morning.  3. Probable dehydration with slight tachycardia and subjective      orthostatic symptoms.   PLAN:  1. The patient being admitted for inpatient treatment to include IV      steroids, continuation of her Colazal.  2. For now we will hold on the Rowasa enemas  because I do not think      she is retaining this medication very effectively with the      increased stool frequency.  3. We will allow clear liquids.  4. We will provide pain and nausea control as needed with morphine and      Phenergan.  5. Await labs studies and if she is significantly anemic, i.e.,      hemoglobin at or below 8.5.  6.  Hydrate aggressively.      Azucena Freed, PA-C      Lowella Bandy. Olevia Perches, MD  Electronically Signed    SG/MEDQ  D:  07/04/2007  T:  07/04/2007  Job:  [id number]   cc:   Chipper Herb, M.D.  Fax: [phone]   Milus Banister, MD  [address]  [place], [place] [address]

## 2010-08-05 NOTE — Assessment & Plan Note (Signed)
[facility] OFFICE NOTE   NAME:REAVESFlorie, Carico                         MRN:          [id number]  DATE:04/26/2007                            DOB:          01/27/1941    PRIMARY CARE PHYSICIAN:  Dr. Olivia Mackie.   GI PROBLEM LIST:  1. Left-sided ulcerative colitis.  Colonoscopy March 2008 by Dr. Vickii Chafe confirmed moderate colitis to the transverse colon.      Biopsies showed chronic changes, no signs of Crohn's.  The patient      responded to inpatient IV steroids.  Tapering off of prednisone      well.  June 2008 off prednisone, and doing well.  Maintaining on      Colazal 6.75 g daily.  Changed from Colazal to Lialda 4.8 grams      daily, July 2008.  Chronic intermittent left lower quadrant pains      with a history of diverticulosis.  Symptoms seem to be worsening      and so a CT scan performed December 01, 2006, with IV and oral      contrast.  There was focal thickening of the sigmoid colon,      otherwise essentially normal.  Flare October 2008, frequency, left      lower quadrant pain.  CBC normal.  Changed from Lialda to Asacol      and started prednisone 40 mg a day.  TPMT testing performed showing      intermediate activity of the enzyme.  Patient was started on      azathioprine 100 mg a day beginning late November 2008.  Intolerant      of AZATHIOPRINE.  Nausea, vomiting, and chest pain.   INTERVAL HISTORY:  Last saw Thatiana 3 weeks ago.  We discussed trying her  on Humira.  She had PPD placed that was negative.  She has been working  with her [facility], and she has found it will cost $2000 the  first month and $1000 the second month out of pocket, and that would  likely continue for some time.  She says she just cannot afford this.  She has had bleeding for the past 3 to 4 days, mild frequency, mild  urgency.  Fairly soft but formed stools.   CURRENT MEDICATIONS:  Diovan and  aspirin.   PHYSICAL EXAM:  Weight 167 pounds, blood pressure 140/84, pulse 80.  CONSTITUTIONAL:  Generally well-appearing.  ABDOMEN:  Soft, nontender, nondistended.  Normal bowel sounds.  EXTREMITIES:  No lower extremity edema.   ASSESSMENT AND PLAN:  A 70 year old woman with left-sided UC.   Will arrange for Ms. Cormier to have a repeat colonoscopy tomorrow to  assess her disease.  Her last colonoscopy was about a year ago, and  since then she has been off and on multiple medications, steroids,  AZATHIOPRINE.  She is intolerant of AZATHIOPRINE and refuses to ever try  it again.  She cannot afford the Humira out of pocket expense.  We  discussed Remicade and we also discussed methotrexate, either one  which  may be less expensive.  I also discussed surgical referral to consider  colectomy.  For now, she will get back on her prednisone, since she does  seem to be on a mild flare, 10 mg twice daily, and she will have a  colonoscopy  tomorrow.  Will discuss at that time getting her methotrexate, depending  on the findings on colonoscopy.     Milus Banister, MD  Electronically Signed    DPJ/MedQ  DD: 04/26/2007  DT: 04/26/2007  Job #: [id number]   cc:   Dr. Olivia Mackie

## 2010-08-05 NOTE — Assessment & Plan Note (Signed)
[name] OFF[facility] NOTE   NAME:[name], [name]                         MRN:          [id number]  DATE:02/22/2007                            DOB:          08/27/1940    PRIMARY CARE PHYSICIAN:  Mercie Eon.   GI PROBLEM LIST:  1. Left-sided ulcerative colitis.  Colonoscopy March 2008 by Dr. Vickii Chafe confirmed moderate colitis to the transverse colon.      Biopsies showed chronic changes, no signs of Crohn's.  The patient      responded to inpatient IV steroids.  Tapering off of prednisone      well.  June 2008 off prednisone, and doing well.  Maintaining on      Colazal 6.75 g daily.  Changed from Colazal to Lialda 4.8 grams      daily, July 2008.  Chronic intermittent left lower quadrant pains      with a history of diverticulosis.  Symptoms seem to be worsening      and so a CT scan performed December 01, 2006, with IV and oral      contrast.  There was focal thickening of the sigmoid colon,      otherwise essentially normal.  Flare October 2008, frequency, left      lower quadrant pain.  CBC normal.  Changed from Lialda to Asacol      and started prednisone 40 mg a day.  TPMT testing performed showing      intermediate activity of the enzyme.  Patient was started on      azathioprine 100 mg a day beginning late November 2008.   INTERVAL HISTORY:  I last saw [name] 5 weeks ago, her bowels have  definitely improved with the prednisone 40 mg a day.  She has self  tapered herself down to 20 mg a day.  TPMT testing was performed and it  showed intermediate activity.  I started her on azathioprine 100 mg a  day which is not in the therapeutic range due to her intermediate  activity.  There was a medication error and she was also called on some  6-MP and has been on that as well for the past several days, taking 100  mg a day of that as well.  She has been feeling very well from a bowel  perspective.   She moves 2-3 times a day with no bleeding, her left lower  quadrant pain is much improved although she still has some aching there.  She has had no fevers or chills.   CURRENT MEDICINES:  1. Diovan.  2. Prednisone 20 mg a day.  3. Azathioprine 50 mg twice a day.   PHYSICAL EXAMINATION:  Weight 166 pounds, which is up three pounds since  her last visit, blood pressure 124/70, pulse 86.  CONSTITUTIONAL:  Generally well-appearing.  ABDOMEN:  Soft, nontender, nondistended, normal bowel sounds.   ASSESSMENT/PLAN:  A 70 year old woman with left-sided colitis.   I have noted a medication error, [name] has been taking 6-MP as well  as azathioprine for the  last week.  I took her 6-MP bottle from her and  I have thrown it away.  I checked with her pharmacy and she is not  currently written for any more 6-MP.  She will continue the azathioprine  100 mg a day, she will also taper the prednisone by 5 mg a week over the  next few weeks until she is completely off.  She will return to see me  in 5 weeks and sooner if needed.  She is getting  a set of CBC and complete metabolic profile done today, I will probably  get a repeat set of blood work in a week or two depending on how her  labs look today.     Milus Banister, MD  Electronically Signed    DPJ/MedQ  DD: 02/22/2007  DT: 02/22/2007  Job #: [id number]   cc:   Mercie Eon

## 2010-08-05 NOTE — Discharge Summary (Signed)
NAMEREISA, COPPOLA                ACCOUNT NO.:  [id number]   MEDICAL RECORD NO.:  [id number]          PATIENT TYPE:  INP   LOCATION:  [place]                         FACILITY:  [facility]   PHYSICIAN:  Lowella Bandy. Olevia Perches, MD     DATE OF BIRTH:  08/02/1940   DATE OF ADMISSION:  07/04/2007  DATE OF DISCHARGE:  07/10/2007                               DISCHARGE SUMMARY   ADMITTING DIAGNOSES:  27. A 70 year old female with bloody diarrhea felt secondary to a      probable flare of ulcerative colitis, rule out superimposed      infectious colitis.  2. History of hypertension.  3. Dehydration with tachycardia and subjective orthostatic symptoms.  4. History of diverticular disease.  5. Hyperlipidemia.  6. Status post cholecystectomy, hysterectomy, and bilateral      nephrectomy.   DISCHARGE DIAGNOSES:  33. A 70 year old female with ulcerative colitis exacerbation,      improving  2. Mild anemia secondary to above.  3. History of hypertension.  4. Dehydration with tachycardia and subjective orthostatic symptoms.  5. History of diverticular disease.  6. Hyperlipidemia.  7. Status post cholecystectomy, hysterectomy, and bilateral      nephrectomy.   CONSULTATIONS:  None.   PROCEDURES:  None.   BRIEF HISTORY:  Ms. Shellhammer is a pleasant 70 year old white female with  history of left-sided ulcerative colitis.  Her last colonoscopy was  April 27, 2007 per Dr. Ardis Hughs.  On that day she was found to have been  erosive friable mucosa, that abruptly stopped at 30 cm, and beyond this  the mucosa appeared normal.  She did have rectal involvement.  Pathology  confirmed chronic active colitis consistent with IBD.  There was noted  to be some glandular atypia, but no high-grade dysplasia.  She had been  on Colazal 9 tablets daily and Rowasa enemas at bedtime.  She had not  required any recent prednisone.  She had tried azathioprine, but had  side effects with nausea and vomiting and chest pain.   On the  day of admission she had called complaining of 2 weeks' of  somewhat progressive bloody diarrhea, left lower quadrant pain, and back  pain.  She had progressed to the point of having 15 bowel movements per  day, small quantity, and some fecal incontinence.  Nausea without  vomiting.  She complained of feeling dizzy, with standing on the day of  admission.  She was seen and evaluated in the emergency room and  admitted for hydration and medical management of what was felt to be an  ulcerative colitis exacerbation.   LABORATORY STUDIES:  On admission April 14 WBC of 5.9, hemoglobin 13.1,  hematocrit of 38.5, MCV of 90, platelets 278.  Serial values were  obtained on April 17:  WBC was 7.3, hemoglobin 12, hematocrit of 35.3,  platelets 266.  Electrolytes within normal limits.  Albumin 3.2.  Liver  function studies normal.  Hemoglobin A1c was slightly elevated at 6.8.  UA was negative.  Stool for C. difficile negative.  Stool culture  negative.  EHEC toxin by EIA was negative.  O&P negative.   HOSPITAL COURSE:  The patient was admitted to the service of Dr. Delfin Edis who was covering the hospital.  She was placed on IV fluids for  hydration, clear liquid diet, started on Solu-Medrol at 40 mg daily, and  continued on Colazal at 750 t.i.d.  She also had Flagyl added to her  regimen.  The patient had a benign hospital course.  Her symptoms  gradually improved.  She had a PPD placed on the 15th in anticipation of  possible outpatient biologic therapy, and by the 17th  she was feeling  considerably better.  PPD was negative.  Her diet was advanced, and on  April 19 was allowed discharge to home with instructions to follow up  with Dr. Ardis Hughs on May 6 at 11:30 a.m.; to call for problems in the  interim.   MEDICATIONS:  She was to continue her usual home meds, in addition,  1. Colazal 750 three tablets 3x times daily.  2. Prednisone 20 mg daily x10 days, then 10 mg daily x10 days, then 5       mg daily x10 days, and then discontinue.  3. Florastor 1 p.o. b.i.d.  4. Darvocet N 100 as needed for pain.  5. Rowasa enemas nightly.      Amy Boothwyn, PA-C      Dora M. Olevia Perches, MD  Electronically Signed    AE/MEDQ  D:  08/01/2007  T:  08/01/2007  Job:  [id number]   cc:   Milus Banister, MD  [address]  [place], [place] [address]

## 2010-08-05 NOTE — Assessment & Plan Note (Signed)
[name] OFFICE NOTE   [name], [name]                         MRN:          [id number]  DATE:03/25/2007                            DOB:          01/02/1941    PRIMARY CARE PHYSICIAN:  Dr. Olivia Mackie.   GI PROBLEM LIST:  1. Left-sided ulcerative colitis.  Colonoscopy March 2008 by Dr. Vickii Chafe confirmed moderate colitis to the transverse colon.      Biopsies showed chronic changes, no signs of Crohn's.  The patient      responded to inpatient IV steroids.  Tapering off of prednisone      well.  June 2008 off prednisone, and doing well.  Maintaining on      Colazal 6.75 g daily.  Changed from Colazal to Lialda 4.8 grams      daily, July 2008.  Chronic intermittent left lower quadrant pains      with a history of diverticulosis.  Symptoms seem to be worsening      and so a CT scan performed December 01, 2006, with IV and oral      contrast.  There was focal thickening of the sigmoid colon,      otherwise essentially normal.  Flare October 2008, frequency, left      lower quadrant pain.  CBC normal.  Changed from Lialda to Asacol      and started prednisone 40 mg a day.  TPMT testing performed showing      intermediate activity of the enzyme.  Patient was started on      azathioprine 100 mg a day beginning late November 2008.   INTERVAL HISTORY:  I last saw [name] 2 to 3 weeks ago when she was  hospitalized at [facility].  She had chest pain and had some  nausea and vomiting.  She did not have acute pancreatitis and cardiology  evaluated her and did not feel she was having acute cardiac events.  This was shortly after starting her azathioprine.  She held it for a  couple days and felt good again.  She restarted the azathioprine 1/2  dose because she had some mild leukopenia, and noticed the same symptoms  returned.  I suspect she has intolerance/side effects from the  azathioprine.   She is adamant about never starting this medicine again.  She has 1 to 2 soft, well-formed stools daily.  No bleeding.  She has  very mild left lower quadrant pain only.   CURRENT MEDICATIONS:  Diovan.  Aspirin.  Prilosec over-the-counter.   PHYSICAL EXAM:  Weight 166 pounds, which is stable since her last visit.  Blood pressure 138/76, pulse 80.  CONSTITUTIONAL:  Generally well-appearing.  ABDOMEN:  Soft, nontender, nondistended.  Normal bowel sounds.   ASSESSMENT AND PLAN:  A 70 year old woman with left-sided UC.  [name] is intolerant of azathioprine for gastro-neurologic side effects.  I have not been able to keep her in remission with simple mesalamine  products.  She has been back on prednisone at least 2 to 3 times now.  We discussed trying a new class of medicines, specifically anti-TNF  medicines.  I explained that there are risks of immunosuppression  infections and even a small chance of more cancer, such as lymphoma,  demyelinating processes.  I explained to her that these risks are very  small and we would watch her very closely, and therefore, we will go  ahead and arrange for her to get a PPD placed to check for latent  tuberculosis.  If that checks out well, we will likely start her up on  Humira at standard dosing, the starting dose of 160 mg on day 1, 80 mg  for 2 weeks, and then  40 mg every other week following that.  Currently, she is off prednisone  and her bowel symptoms are under good control.     Milus Banister, MD  Electronically Signed    DPJ/MedQ  DD: 03/25/2007  DT: 03/25/2007  Job #: [id number]   cc:   Dr. Olivia Mackie

## 2010-08-05 NOTE — Assessment & Plan Note (Signed)
[place] OFFICE NOTE   NAME:Andre, Infinity                         MRN:          [id number]  DATE:10/18/2006                            DOB:          [birth year]    GI PROBLEM LIST:  1. Left-sided ulcerative colitis.  Colonoscopy March 2008 by Dr. Vickii Chafe confirmed moderate colitis to the transverse colon.      Biopsies showed chronic changes, no signs of Crohn's.  The patient      responded to inpatient IV steroids.  Tapering off of prednisone      well.  June 2008 off prednisone, and doing well.  Maintaining on      Colazal 6.75 g daily.   INTERVAL HISTORY:  I last saw Sherree about 1 month ago.  She is having 2  to 4 soft, usually formed, bowel movements a day.  These have never been  bloody.  She says certain foods will make it either more solid or more  loose.  She has no pain.  She has gained 4-5 pounds since her last visit  and she is not happy about this.  She is eating well.   CURRENT MEDICINES:  1. Prenatal vitamin.  2. Vitamin E.  3. Vitamin B12.  4. Canasa suppositories once nightly.  5. Colazal 6.75 g daily.  6. Diovan HCT.   PHYSICAL EXAM:  Weight 166 pounds, blood pressure 134/82, pulse 68.  CONSTITUTIONAL:  Generally well appearing.  ABDOMEN:  Soft, nontender, nondistended, normal bowel sounds.   ASSESSMENT AND PLAN:  A 70 year old woman with left-sided ulcerative  colitis to the transverse colon.   Since being of steroids she has not had a return of bleeding and her  bowels have been fairly good.  She does have some mild urgency which is  somewhat bothersome to her.  She is maxed out on Colazal.  I will change  her to Lialda 4.8 g daily to see if a different mechanism of delivery of  the mesalamine will help with her more minor symptoms now.  She knows to  continue the Canasa suppository daily.  She will return to see me in 4  weeks, and sooner if needed.     Milus Banister, MD  Electronically Signed    DPJ/MedQ  DD: 10/18/2006  DT: 10/18/2006  Job #: [id number]   cc:   Olivia Mackie, Dr.

## 2010-08-05 NOTE — Assessment & Plan Note (Signed)
[place]                                 ON-CALL NOTE   NAME:REAVESThera, Basden                         MRN:          [id number]  DATE:01/16/2007                            DOB:          Feb 12, 1941    CHIEF COMPLAINT:  Left-sided pain, bleeding.   Ms. Sliva is a lady with left-sided ulcerative colitis.  She is having  steadily increasing pain and diarrhea.  Not really a new problem just  sort of an exacerbation of chronic issues.  I have reviewed her notes on  January 07, 2007, where she saw Dr. Ardis Hughs.  She is currently on Lialda  and prednisone.  She is taking Darvocet, but it is not really helping  her pain at this time.  She has not had a fever, not vomiting.   ASSESSMENT:  Worsening left-sided ulcerative colitis.   PLAN:  Take 2 Darvocet at a time.  I will call in a prescription.  We  will need to contact her and have her come into the office early in the  week, see Dr. Ardis Hughs.  She knows to call back if this approach does not  help.  She is also to go on a liquid diet at this time.     Gatha Mayer, MD,FACG  Electronically Signed    CEG/MedQ  DD: 01/16/2007  DT: 01/16/2007  Job #: [id number]   cc:   Milus Banister, MD

## 2010-08-05 NOTE — Assessment & Plan Note (Signed)
[place] OFFICE NOTE   NAME:REAVESErika, [name]                         MRN:          [id number]  DATE:08/03/2006                            DOB:          04/28/1940    PRIMARY CARE PHYSICIAN:  Dr. Mercie Eon.   GI PROBLEM LIST:  1. Left-sided ulcerative colitis.  Colonoscopy March 2008 by Dr. Vickii Chafe confirmed moderate colitis to the transverse colon.      Biopsies showed chronic changes, no signs of Crohn's.  The patient      responded to inpatient IV steroids.  Tapering off of prednisone      well.   INTERVAL HISTORY:  I last saw [name] 3 weeks ago.  She had come down on  her steroids a bit faster than we wanted her to so she went back up to  30 mg a day and has been tapering 5 mg per week since then.  She is  currently on 20 mg a day and is due to go down to 15 mg tomorrow.  She  is taking 750 mg of Colazal 3 times a day and a Canasa suppository once  nightly.  On this regimen she has been doing very well.  She has 1-4  soft, formed, non-bloody bowel movements a day; on average she has 2  bowel movements a day.  She has no abdominal pain, her fatigue is still  significant but is improving.  She said she is probably back to 75% of  her normal.   CURRENT MEDICATIONS:  1. Multivitamin.  2. Vitamin E.  3. B-12.  4. Prednisone 20 mg.  5. Canasa suppository once nightly.  6. Colazal 750 mg t.i.d.  7. Diovan hydrochlorothiazide.   PHYSICAL EXAMINATION:  Weight is 159 pounds, blood pressure 132/84,  pulse 80.  CONSTITUTIONAL:  Generally well-appearing.  LUNGS:  Clear to auscultation bilaterally.  HEART:  Regular rate and rhythm.  ABDOMEN:  Soft, nontender, nondistended, normal bowel sounds.   ASSESSMENT/PLAN:  A 70 year old woman with left-sided UC to the  transverse colon, steroid sensitive.   She will continue to taper her steroids by 5 mg a week.  She will return  to see me in 4 weeks.   I will repeat blood testing on her then.  I also  recommended she start taking daily folic acid while  on Colazal, likely she will do that by taking prenatal multivitamins  instead of a regular multivitamin.     Milus Banister, MD  Electronically Signed    DPJ/MedQ  DD: 08/03/2006  DT: 08/03/2006  Job #: [id number]   cc:   Mercie Eon, MD

## 2010-08-05 NOTE — Assessment & Plan Note (Signed)
[place] OFFICE NOTE   NAME:[name], [name]                         MRN:          [id number]  DATE:03/15/2007                            DOB:          24-Aug-1940    PRIMARY CARE PHYSICIAN:  Mercie Eon, NP, [facility].   REASON FOR PRESENTATION:  Evaluate patient with recent hospitalization  for chest pain.   HISTORY OF PRESENT ILLNESS:  Patient was hospitalized from March 03, 2007 to March 04, 2007 with chest discomfort.  It was felt to be  nonanginal.  She ruled out for myocardial infarction.  She did have an  echocardiogram, which demonstrated no abnormalities.  She was seen by  Dr. Owens Loffler because of a low white blood cell count.  The patient  is on azathioprine.  The patient had her azathioprine reduced to 50 mg  once a day from b.i.d.  However, she said she went home and felt very  poorly when she took this medicine.  Therefore, she stopped it  completely.  She has followup with Dr. Ardis Hughs.  She has continued to  have some mild left-sided chest discomfort and arm discomfort.  However,  this is much less severe than previous.  This is not reproducible with  activity.  She has had no shortness of breath.  Denies any PND or  orthopnea.  She has had no palpitations, presyncope, or syncope.   PAST MEDICAL HISTORY:  1. Hypertension.  2. Dyslipidemia.  3. Ulcerative colitis.  4. Hysterectomy.  5. Cholecystectomy.   ALLERGIES:  1. PENICILLIN.  2. CODEINE.  3. SULFA.  4. BETADINE.   MEDICATIONS:  1. Diovan/hydrochlorothiazide 160/12.5 daily.  2. Aspirin 81 mg daily.   REVIEW OF SYSTEMS:  As stated in the HPI, and otherwise negative for  other systems.   PHYSICAL EXAMINATION:  Patient is in no distress.  Blood pressure 160/90.  Heart rate 81 and regular.  Weight 167 pounds.  Body mass index 25.  HEENT:  Eyes unremarkable.  Pupils equal, round, and reactive to  light.  Fundi not visualized.  NECK:  No jugular venous distention at 45 degrees.  Carotid upstroke  brisk and symmetric.  No bruits.  No thyromegaly.  LYMPHATICS:  No adenopathy.  LUNGS:  Clear to auscultation bilaterally.  BACK:  No costovertebral angle tenderness.  CHEST:  Unremarkable.  HEART:  PMI not displaced or sustained.  S1 and S2 within normal limits.  No S3.  No S4.  No clicks.  No rubs.  No murmurs.  ABDOMEN:  Flat.  Positive bowel sounds.  Normal in frequency and pitch.  No bruits.  No rebound.  No guarding.  No midline pulsatile mass.  No  organomegaly.  SKIN:  No rashes.  No nodules.  EXTREMITIES:  Two plus pulses.  No edema.  EKG:  Sinus rhythm.  Rate 81.  Axis within normal limits.  Intervals  within normal limits.  No acute ST-T wave changes.  Diffuse T wave  flattening.   ASSESSMENT AND PLAN:  1. Chest discomfort.  The patient's  chest discomfort is atypical.  She      ruled out for myocardial infarction.  She had a negative stress      perfusion study in the recent past.  At this point, no further      cardiovascular testing is suggested.  2. Hypertension.  Blood pressure is elevated today, but it was in the      hospital.  I have asked her to keep a blood pressure diary, and she      does have a cuff at home.  I gave her specific instructions on      this.  She may need medication adjustment based on these results.      She understands therapeutic lifestyle changes (TLC).  3. Ulcerative colitis.  The patient has followup with Dr. Ardis Hughs.  She      is currently not taking her azathioprine.  4. Followup.  I will see her back in late February to review her blood      pressure.     Minus Breeding, MD, [facility]  Electronically Signed    JH/MedQ  DD: 03/15/2007  DT: 03/15/2007  Job #: [id number]   cc:   Mercie Eon, NP

## 2010-08-05 NOTE — Discharge Summary (Signed)
[name], [name]                ACCOUNT NO.:  [id number]   MEDICAL RECORD NO.:  [id number]          PATIENT TYPE:  INP   LOCATION:  2778                         FACILITY:  [place]   PHYSICIAN:  Minus Breeding, MD, FACCDATE OF BIRTH:  12/20/1940   DATE OF ADMISSION:  03/03/2007  DATE OF DISCHARGE:  03/04/2007                               DISCHARGE SUMMARY   PRIMARY CARDIOLOGIST:  Minus Breeding, MD, [facility].   PRIMARY CARE PHYSICIAN:  Chipper Herb, M.D.   GASTROENTEROLOGIST:  Milus Banister, M.D.   PROCEDURE PERFORMED DURING HOSPITALIZATION:  None.   FINAL DISCHARGE DIAGNOSES:  1. Atypical chest pain, thought to be musculoskeletal in origin.  2. History of ulcerative colitis, being followed by Dr. Ardis Hughs.  3. History of hypertension.   HOSPITAL COURSE:  This is a 70 year old Caucasian female with no known  cardiac history who had complaints of chest pain four days prior to  admission, which she described as constant, stabbing, radiating to the  back, left arm, and right jaw while at a Sunday school party.  The  patient felt weakness and had some dizziness.  The patient states that  she had not felt that type of pain before.  She did not have significant  shortness of breath, dizziness, or diaphoresis.  She did have one  episode of nausea and vomiting the day prior to admission.   This patient was seen in the emergency room after being sent here from  her primary care physician's office via EMS.  She was seen and examined  by myself and Dr. Jacqulyn Ducking in the ER.  The pain appeared to be  atypical, as it was constant and unrelenting.  The patient did have some  diminished discomfort with the use of Pepto-Bismol and Prevacid,  although it was not completely eliminated.  The patient was found to  have some leukopenia as well, which was thought to be related to the use  of azathioprine 50 mg, which she is taking for ulcerative colitis under  the supervision of Dr.  Ardis Hughs.   Cardiac enzymes were cycled and found to be negative x3.  Dr. Ardis Hughs was  consulted for evaluation of GI symptoms and adjustment of azathioprine,  if necessary.  The patient had a decreased dose of azathioprine from 50  mg b.i.d. to 50 mg once daily and is to have follow-up labs checked  locally at our physician's office and faxed to him as an outpatient.  Otherwise, there were no changes in medication regimen and no acute  abnormalities seen on x-ray, labs.   Patient was seen and examined by Dr. Minus Breeding on the day of  discharge and found to be stable.  Blood pressure was well controlled.  Heart rate was controlled.  EKG was normal.  Troponins were found to be  negative.  The patient was recommended to be started on outpatient  Prilosec twice daily in addition to her current medication regimen and  was seen by Dr. Ardis Hughs also on the day of discharge.   DISCHARGE LABS:  Sodium 138, potassium 3.8, chloride 105, CO2 23,  BUN  12, creatinine 0.88, hemoglobin 12.3.  White blood cells 2.7, platelets  300.   VITAL SIGNS:  Blood pressure 123/77, heart rate is 70, respirations are  17.  O2 sat 96% on room air.  Temperature 97.1.   DISCHARGE MEDICATIONS:  1. Prilosec OTC 1 daily.  2. Azathioprine 50 mg daily.  3. Diovan/HCTZ 150/12.5 daily.  4. Aspirin 81 mg daily.   ALLERGIES:  PENICILLIN, MYCINS, SULFA, CODEINE, BETADINE and TAPE.   FOLLOW-UP PLANS/APPOINTMENTS:  1. Patient will follow up with Dr. Ardis Hughs on a previously scheduled      appointment for continued GI evaluation and treatment.  2. Patient will follow up with Dr. Percival Spanish on March 15, 2007 at      9:45 a.m. for continued cardiac management.  3. Patient will follow up with the primary care physician for      outpatient labs to be faxed to Dr. Ardis Hughs and continued medical      management.   Time spent with the patient, to include physician time, 30 minutes.      Phill Myron. Purcell Nails, NP      Minus Breeding, MD, [facility]  Electronically Signed    KML/MEDQ  D:  03/04/2007  T:  03/04/2007  Job:  [id number]   cc:   Milus Banister, MD  Chipper Herb, M.D.

## 2010-08-05 NOTE — Assessment & Plan Note (Signed)
[name]   NAME:[name], [name]                         MRN:          [id number]  DATE:06/01/2007                            DOB:          1940/08/06    GI FOLLOWUP [name]:   PRIMARY CARE PHYSICIAN:  Dr. Mercie Eon   GI PROBLEM LIST:  1. Left-sided ulcerative colitis.  Colonoscopy March 2008 by Dr. Vickii Chafe confirmed moderate colitis to the transverse colon.      Biopsies showed chronic changes, no signs of Crohn's.  The patient      responded to inpatient IV steroids.  Tapering off of prednisone      well.  June 2008 off prednisone, and doing well.  Maintaining on      Colazal 6.75 g daily.  Changed from Colazal to Lialda 4.8 grams      daily, July 2008.  Chronic intermittent left lower quadrant pains      with a history of diverticulosis.  Symptoms seem to be worsening      and so a CT scan performed December 01, 2006, with IV and oral      contrast.  There was focal thickening of the sigmoid colon,      otherwise essentially normal.  Flare October 2008, frequency, left      lower quadrant pain.  CBC normal.  Changed from Lialda to Asacol      and started prednisone 40 mg a day.  TPMT testing performed showing      intermediate activity of the enzyme.  Patient was started on      azathioprine 100 mg a day beginning late November 2008.  Intolerant      of AZATHIOPRINE.  Nausea, vomiting, and chest pain.  Repeat      colonoscopy, April 27, 2007, showed left-sided colitis with      distinct transition point at 30 cm from anus.  Biopsies showed      chronic active colitis with glandular atypia.  The comment section      of the pathology shows the findings are atypical and are in the      setting of rather severe acute inflammation and are, therefore,      indeterminate for low-grade glandular dysplasia.  There was no high-      grade dysplasia and no invasive carcinoma.   INTERVAL  HISTORY:  I last saw [name] at the time of her colonoscopy about  one month ago.  She restarted the Colazal at six tabs daily and was  taking a mesalamine enema once daily (rather than the two I  recommended).  She noticed a big improvement after about two weeks and,  for the last week, has had one to three soft, nonbloody, brown stools  daily.  She has not had bad cramping.  She thinks food really plays a  role in whether she has one or three soft bowel movements a day.   CURRENT MEDICINES:  Diovan, aspirin, Colazal 6 tabs daily.   PHYSICAL EXAM:  Weight is 167 pounds, blood pressure  144/90, pulse 80.  CONSTITUTIONAL:  Generally well-appearing.  ABDOMEN:  Soft, nontender, nondistended, normal bowel sounds.   ASSESSMENT AND PLAN:  Patient is a 70 year old woman with left-sided  ulcerative colitis (distinct transition point at 30 cm from anus).   There were some atypical glands seen by the colonoscopy, which I did not  mention above, was incomplete colonoscopy due to significant pains.  This seems likely to be related to such severe inflammation and this was  also the conclusion by the pathologist who read her slides.  To be  certain, I will plan for her to have a repeat colonoscopy in 6-12  months' time, although we did not go into discussion of that at this  visit.  She is definitely improving and my goal will be to maximize her  mesalamine products, including enema form and oral, to keep her feeling  well.  I recommended she stay on the Colazal at its current dose and to  restart the Rowasa enemas at one enema nightly.  She will return to see  me in one month's time.  We will discuss more specifics about her  atypical glands on the colonoscopy at that point.     Milus Banister, MD  Electronically Signed    DPJ/MedQ  DD: 06/01/2007  DT: 06/01/2007  Job #: [id number]

## 2010-08-05 NOTE — H&P (Signed)
NAMEDENESE, MENTINK                ACCOUNT NO.:  [id number]   MEDICAL RECORD NO.:  [id number]          PATIENT TYPE:  INP   LOCATION:  7867                         FACILITY:  [facility]   PHYSICIAN:  Milus Banister, MD   DATE OF BIRTH:  [birth year]   DATE OF ADMISSION:  06/28/2006  DATE OF DISCHARGE:  07/05/2006                              HISTORY & PHYSICAL   PROBLEM:  Ulcerative colitis, poorly controlled, with severe diarrhea.   HISTORY:  [name] is a pleasant 70 year old white female who was admitted  through the emergency room with complaints of severe diarrhea.  She had  been admitted at [facility] on June 11, 2006 for a couple of  days with the severe diarrhea.  She had negative stool cultures, etc.  She was placed on Questran, and arrangements were made for her to be  seen by [facility] as an outpatient.  She was seen on June 15, 2006 by  Dr. Sharlett Iles with one-month history of significant diarrhea.  She had  failed an empiric course of metronidazole, Cipro, and prednisone.  She  had had previous attempts at colonoscopy per Dr. Lyla Son and had an  incomplete exam in 2002 and Octoberof 2007.  Subsequent barium enema  in Augustof 2002 showed a diffuse diverticulosis and marked  tortuosity and spasm of her colon.  There was empiric biopsy of the  sigmoid colon done in Octoberof 2007 suggesting possible ulcerative  colitis.  After she was seen by Dr. Sharlett Iles, she was set up for a  sigmoidoscopy which was done on the 26th.  This confirmed left-sided  ulcerative colitis, and she was started on mesalamine enemas at bedtime  and Colazal 750 three p.o. t.i.d.  She was continued on Questran b.i.d.  and then Lomotil as needed.  Despite this regimen, she continued with  severe diarrhea and presented to the emergency room with severe diarrhea  and nausea and was admitted for supportive management and more  aggressive medical management of her newly diagnosed ulcerative  colitis.   MEDICATIONS PRIOR TO ADMISSION:  1. Canasa.  2. Rowasa enemas q.h.s.  3. Lomotil p.r.n.  4. Colazal 750 three p.o. t.i.d.  5. Questran 4 grams b.i.d.   ALLERGIES/INTOLERANCES:  BETADINE, CODEINE, ERYTHROMYCIN, LATEX,  PENICILLIN, AND SULFA.   PAST MEDICAL HISTORY:  1. Cholecystectomy in 1999.  2. Remote hysterectomy and an eventual BSO.  3. Hypertension.  4. Fibromyalgia   SOCIAL HISTORY:  The patient is widowed, has four stepchildren.  Drinks  wine occasionally.  She is a nonsmoker.  She is employed as a Licensed conveyancer.  She remains fairly active.   FAMILY HISTORY:  Negative for GI disease.  She had a brother with an MI  at age 31 status post bypass.   REVIEW OF SYSTEMS:  CARDIOVASCULAR:  Denied any chest pain or anginal  symptoms.  PULMONARY:  Negative for cough, shortness of breath, or  sputum production.  GENITOURINARY:  Denied any dysuria, urgency or  frequency.  GI:  As outlined above.  NEUROLOGIC:  Negative.  SKIN:  Negative.  MUSCULOSKELETAL:  Negative.  Currently, she does have  fibromyalgia and gets some aching in her legs intermittently.  All other  review of systems negative.   PHYSICAL EXAMINATION:  Unable to dictate accurately at this time, as the  handwritten admission history and physical was lost on the day of  admission, and this dictation is being done post-discharge.   IMPRESSION:  41. 70 year old white female with newly diagnosed left-sided ulcerative      colitis refractory to outpatient management.   PLAN:  The patient is admitted to the service of Dr. Owens Loffler who  is covering the hospital.  She will be placed on IV fluids for  hydration, placed at bowel rest with clear liquids.  Will obtain plain  abdominal films, cover her with IV Solu-Medrol at 30 mg q.12h., obtain  stool cultures.  Continue Colazal at current dose and increase Rowasa  enemas to b.i.d.  She will be given antiemetics and antispasmodics and  pain control as  indicated.  For further details, please see the orders.   ADDENDUM:  Original hap was lost after admission, and this dictation was  composed from information contained in the chart.      [name] Ba, PA-C      Milus Banister, MD  Electronically Signed    AE/MEDQ  D:  07/20/2006  T:  07/20/2006  Job:  [id number]

## 2010-08-08 NOTE — Assessment & Plan Note (Signed)
[place] OFFICE NOTE   NAME:[name], [name]                         MRN:          [id number]  DATE:06/15/2006                            DOB:          March 08, 1941    [name] was recently hospitalized because of severe diarrhea.  She was in  [facility] and had negative evaluations for the causes  of diarrhea including several stool exams for C. difficile.  During her  hospitalization she was bothered by hyponatremia and hypokalemia.  She  was discharged on Questran 4 grams twice a day with Gatorade and is  doing better.   This diarrhea has been going on for a month and she has seen Tivis Ringer at [facility].  Had an empiric  treatment with metronidazole, Cipro, and prednisone all with no effect.  She denies antibiotic exposure before her illness or any sick family  members at home.  It seems that her illness did begin as a viral  syndrome that other workers at work had, but theirs cleared up easily.  She recently has had some bright red blood per rectum and has vague  lower abdominal discomfort but no fever, chills, nausea, vomiting, or  other upper GI hepatobiliary complaints.   Has been seen by Dr. Lyla Son for several years, has had symptomatic  diverticulosis and diverticulitis.  Attempts at colonoscopy exam by Dr.  Velora Heckler were unsuccessful in August of 2002 and in October of 2007.  She  did have a barium enema performed in August of 2002, which showed  diffuse diverticulosis with marked tortuosity and spasm of the colon but  it otherwise was unremarkable without any obstruction or filling  defects.  Empiric biopsy of the sigmoid colon in October of 2007  suggested possible ulcerative colitis.   She currently is taking:  1. Diovan HCTZ 160/12.5 mg a day.  2. Aspirin 81 mg a day.  3. Questran 4 grams twice a day.  4. A variety of multivitamins.   PAST  MEDICAL HISTORY:  Otherwise noncontributory.  She has been  evaluated by her cardiologist in the past and has had a negative  evaluation for myocardial ischemia.  She does have some chronic  insomnia.  She is status post cholecystectomy.   EXAMINATION:  Shows to be a nontoxic, healthy-appearing white female,  appearing her stated age in no acute distress.  She weighs 159 pounds  which is down some 8-9 pounds from her regular weight.  Blood pressure  is 120/78, pulse was 84 and regular.  I could not appreciate stigmata of  chronic liver disease, skin rashes, or joint swelling.  Her abdominal  exam was entirely benign except for some tenderness over her left lower  quadrant to deep palpation.  Inspection of the rectum was unremarkable.  Rectal exam showed watery red stool that was markedly guaiac positive.   ASSESSMENT:  [name] has some type of acute colitis, probably  ulcerative colitis exacerbated by recent viral gastroenteritis.  She has  had numerous stool exams for parasites, cultures, C. difficile which  have been  negative.   RECOMMENDATIONS:  I have asked her to stop her Questran and I will do a  flexible sigmoidoscopy exam tomorrow with a Fleet's enema prep.  I  suspect she does have severe ulcerative colitis and may need  hospitalization for intravenous steroid therapy.  I have sent her by the  lab today to check followup CBC, sed rate, and electrolyte profile.     Loralee Pacas. Sharlett Iles, MD, Quentin Ore, [place]  Electronically Signed    DRP/MedQ  DD: 06/15/2006  DT: 06/15/2006  Job #: [id number]   cc:   Langley Gauss, MD  Mercie Eon, FNP  Chipper Herb, M.D.

## 2010-08-08 NOTE — Assessment & Plan Note (Signed)
[place] OFFICE NOTE   NAME:REAVESElynore, [name]                         MRN:          [id number]  DATE:01/04/2006                            DOB:          1940/07/22    I basically talked to Rory about her findings today.  She said that she was  better.  I told her that everything seemed to point to some diverticulitis,  possibly along with quite a severe diverticulosis.  I described why her  symptoms were as they were from her diverticular disease, and she has such a  fixed left colon.  I could not even advance the endoscope beyond the hepatic  flexure.   EXAM:  Her weight was 165, blood pressure 126/74, pulse 80 and regular.  Remainder of the exam noncontributory unremarkable, except for the abdomen  being tender on palpation in the left lower quadrant.  Not greatly tender,  but enough to make me suspicious it could be some underlying disease.   She has finished the Cipro.  She says it does cause her headaches, but she  can take it.  I told her at length about taking the Cipro, given a  prescription for some more if she needed it, and suggested that we get a CT  scan on her if her symptoms recur or there is significant frequency in the  future.            ______________________________  Clarene Reamer, MD      SML/MedQ  DD:  01/04/2006  DT:  01/05/2006  Job #:  [id number]

## 2010-08-08 NOTE — Assessment & Plan Note (Signed)
[place] OFFICE NOTE   NAME:[name]                         MRN:          [id number]  DATE:11/25/2005                            DOB:          04/04/1940    INDICATION FOR OFFICE VISIT:  Consultation is because of left lower quadrant  pain.  Patient seems to suffer occasionally.  Patient also notes some rectal  bleeding at times but this is suspected to be related to hemorrhoids.  The  only problem she has otherwise noted is some urgency of her bowel activity.  She does have two aunts that had colon cancer and another family member with  polyps.  She did have a colonoscopy examination previously which was not  completed because of the fixed left colon and diverticular disease. A barium  enema was then done which she said was very uncomfortable which revealed  diffuse diverticular disease, particularly involving the sigmoid colon.  There was tortuosity and spasm.  She also had a CT scan which showed some  thickening in the walls of her diverticular disease and mild pericolic  inflammation.  This was treated with Cipro.   PAST MEDICAL HISTORY:  Reveals hypertension, hyperlipidemia, some sinus  trouble and she is status post cholecystectomy.   SOCIAL HISTORY:  She still works as a Occupational hygienist.  Drinks wine  occasionally.   REVIEW OF SYSTEMS:  Reveals some sleeping problems.   ALLERGIES:  __________.   Last menses in 1977.   PHYSICAL EXAMINATION:  VITAL SIGNS:  She is 5 feet 8 inches.  Weighs 167.  Blood pressure 124/72.  Pulse 84 and regular.  NECK:  __________ unremarkable.   IMPRESSION:  1. Hypertension.  2. Status post cholecystectomy.  3. Severe diverticular disease and associated with the left colon that has      been fixed, probably secondary to adhesions.  4. History of rectal bleeding, secondary to internal hemorrhoids.   RECOMMENDATIONS:  1. Re-attempt to the colonoscopies and  see if this can be done with a      smaller scope to      assess her intestine more fully.  It is always possible that this might      not be possible because of the fixed nature of her colon.                                   Clarene Reamer, MD   SML/MedQ  DD:  11/26/2005  DT:  11/27/2005  Job #:  [id number]   cc:   [place]

## 2010-08-08 NOTE — Assessment & Plan Note (Signed)
[name]                                 ON-CALL NOTE   NAME:[name], [name]                       MRN:          [id number]  DATE:06/26/2006                            DOB:          12/28/1940    Telephone number [phone].  Time of the call is 9:42 a.m.   [name] called today with complaints of diarrhea and rectal  bleeding.  She had been having these problems, and was evaluated by Dr.  Sharlett Iles on June 16, 2006.  She underwent colonoscopy, which revealed  ulcerative colitis.  She is on appropriate medication.  She calls now  because she felt the medication had been working initially, though seems  to have had recurrence of symptoms.  However, her symptoms are no worse  than those of her initial presentation.  She is having no problems  nausea, vomiting or abdominal pain.  I reviewed her medications.  At  this point, I advised her to continue on her medications without change  except for Lomotil, which she had been taking 1 every 8 hours as needed.  I told her that she could take as much as 2 every 6 hours as needed.  She has followup appointment with Dr. Velora Heckler in 9 days.  I asked her to  contact the office next week at anytime if she felt her condition was  worsening.  She understood, agreed, and was appreciative.     Docia Chuck. Henrene Pastor, MD  Electronically Signed    JNP/MedQ  DD: 06/26/2006  DT: 06/26/2006  Job #: [id number]   cc:   Loralee Pacas. Sharlett Iles, MD, Quentin Ore, FAGA  Glory Buff, M.D. [facility]

## 2010-08-08 NOTE — Discharge Summary (Signed)
NAMESONAM, WANDEL                ACCOUNT NO.:  [id number]   MEDICAL RECORD NO.:  [id number]          PATIENT TYPE:  INP   LOCATION:  9562                         [facility]ACILITY[facility]  [facility]   PHYSICIAN:  Gatha Mayer, MD,FACGDATE OF BIRTH:  06/22/1940   DATE OF ADMISSION:  06/28/2006  DATE OF DISCHARGE:  07/05/2006                               DISCHARGE SUMMARY   ADMITTING DIAGNOSES.:  66. A 70 year old white female with newly diagnosed left-sided      ulcerative colitis, refractory to outpatient, management with      severe diarrhea.  2. History of diverticulitis.  3. Status post cholecystectomy and hysterectomy.  4. History of hyperlipidemia.   DISCHARGE DIAGNOSES:  1. Improved refractory left-sided ulcerative colitis.  2. Mild anemia secondary to above.  3. Other diagnoses as listed above.   CONSULTATIONS:  None.   PROCEDURES:  None.   BRIEF HISTORY:  Kiernan is a pleasant 70 year old white female, previous  patient of Dr. Franki Cabot, recently known to Dr. Sharlett Iles, who had  actually been hospitalized at [facility] March 21 through March  23 I believe with diarrhea and syncope.  She had stool cultures, etc.  done at that time which were unremarkable.  She was hydrated and then  referred to [place] for further evaluation.  She was seen by Dr.  Sharlett Iles and underwent colonoscopy on March 26 which showed transverse  colon to sigmoid colon left-sided ulcerative colitis.  She also had  evidence of proctitis and relative rectal sparing was felt to be  moderate.  Biopsies were consistent with ulcerative colitis.  She was  started on mesalamine enemas, Colazal 750 three t.i.d., Questran, and  Lomotil.  She did not respond to these measures and continued to have of  progression of her diarrhea with up to 20 bowel movements per day, quite  a bit of abdominal cramping and tenesmus, weakness, and nausea.  She was  admitted for supportive medical management.   LABORATORY  STUDIES:  On admission April 7 WBC of 5.5, hemoglobin 12.5,  hematocrit of 36.5, MCV 87, platelets 233.  On April 12 WBC of 8.5,  hemoglobin 11.6, hematocrit of 33.9.  Electrolytes were within normal  limits on admission.  BUN 11, creatinine 0.9, glucose 14 to 160.  Stool  culture negative.  Stool for C diff negative x3.  Stool for O and P  negative.  Abdominal films on admission showed no free air and a  nonspecific bowel gas pattern.   HOSPITAL COURSE:  The patient was admitted to the service of Dr. Oretha Caprice who was covering the hospital.  She was placed on a clear-liquid  diet.  Plane abdominal films were done which were negative.  She was  rehydrated.  She was started on Solu-Medrol at 30 mg IV q.12 hours and  continued on her Colazal, Rowasa enema twice daily, and Robinul Forte  twice daily.  She did require some narcotics for associated abdominal  pain.  Over the first few days she continued to have multiple bowel  movements with up to 18-20 bowel movements per day which  were small  volume, seemed to be associated with tenesmus, and small amounts of  hematochezia.  She gradually improved.  We very gradually were able to  advance her diet, and by April 12 she was down to about 7 bowel  movements per day which were starting to have some form.  On April 13,  her medications were all switched to oral form and she was advanced to a  low-residue diet which she tolerated.  On April 14, she was allowed  discharge to home in a stable and much improved condition with  instructions to follow up with Dr. Sharlett Iles in the office on April 24.  Call for any problems in the interim.  She was to stay on a low-residue  diet.   MEDICATIONS:  1. Prednisone 40 mg p.o. q.a.m.  2. Rowasa enema nightly.  3. Rowasa suppositories q.a.m.  4. Analpram cream p.r.n.  5. Imodium p.r.n.  6. Robinul Forte 1 p.o. b.i.d.  7. Colazal 750 three p.o. t.i.d.  8. Darvocet-N 100 one every 6 hours as needed for  pain.   CONDITION:  Stable, improved.      Amy Esterwood, PA-C      Gatha Mayer, MD,FACG  Electronically Signed    AE/MEDQ  D:  07/05/2006  T:  07/05/2006  Job:  [id number]   cc:   [place]

## 2010-08-08 NOTE — Assessment & Plan Note (Signed)
[place] OFFICE NOTE   NAME:REAVESJustyne, [name]                         MRN:          [id number]  DATE:07/13/2006                            DOB:          03/05/1941    PRIMARY CARE PHYSICIAN:  Dr. Mercie Eon.   GI PROBLEM LIST:  1. Left-sided ulcerative colitis.  Colonoscopy March 2008 by Dr. Vickii Chafe confirmed moderate colitis to the transverse colon.      Biopsies showed chronic changes, no signs of Crohn's.  The patient      responded to inpatient IV steroids.   I last saw [name] at the time of her hospitalization.  Since then, she  has been having 2-3 soft but formed nonbloody bowel movements daily.  This is a big improvement from when she was flaring in the hospital.  She does have some very mild lower abdominal discomfort that does not  seem to be too great of a worry.  Her biggest complaint is fatigue.  She  is very fatigued.  She has been that way for 6-8 weeks, she says.  She  is not clear how much prednisone she is on.  Her Discharge Summary  clearly stated she should be taking 40 mg a day, but she may have only  been taking 20 mg a day.   CURRENT MEDICATIONS:  1. Multivitamins.  2. Vitamin E.  3. B12.  4. Prednisone, unclear dose.  The patient is to call back later this      afternoon.  5. Canasa suppositories once in the a.m.  6. Rowasa enemas nightly.  7. Colazal 750 mg 3 times a day.  8. Robinul Forte.   PHYSICAL EXAMINATION:  VITAL SIGNS: Weight 159 pounds, stable for the  past month.  Blood pressure 130/80, pulse 76.  CONSTITUTIONAL: Fatigued appearing, otherwise well.  NEUROLOGIC:  Alert and oriented x 3.  ABDOMEN: Soft, nontender, nondistended, normal bowel sounds.   ASSESSMENT AND PLAN:  A 70 year old woman with left-sided UC to the  transverse colon, steroid sensitive.   1. She will call back with her current prednisone dose.  I would like      her to begin a slow  taper, but if she was, indeed, only taking 20      mg a day, I would want to increase her to 30 mg a day. Perhaps this      explains why she is so fatigued; she may have some steroid      withdrawal.  2. I have instructed her to stop taking the Rowasa enemas at night and      change to the Canasa suppositories at night only.  3. I will instruct her also to begin tapering her prednisone by 5 mg      per week.  4. She will continue on the Colazal 750 mg 3 times a day.  5. She will return to see me in one month and sooner if needed.  6. I will get a basic set of labs including complete metabolic      profile,  CBC, and thyroid testing done today.     Milus Banister, MD  Electronically Signed    DPJ/MedQ  DD: 07/13/2006  DT: 07/13/2006  Job #: [id number]

## 2010-08-26 ENCOUNTER — Ambulatory Visit (INDEPENDENT_AMBULATORY_CARE_PROVIDER_SITE_OTHER): Payer: Medicare HMO | Admitting: Internal Medicine

## 2010-08-26 DIAGNOSIS — K5190 Ulcerative colitis, unspecified, without complications: Secondary | ICD-10-CM

## 2010-09-12 ENCOUNTER — Encounter (HOSPITAL_COMMUNITY)
Admission: RE | Admit: 2010-09-12 | Discharge: 2010-09-12 | Disposition: A | Discharge status: Home / Self Care | Payer: Medicare PPO | Source: Ambulatory Visit | Attending: Internal Medicine | Admitting: Internal Medicine

## 2010-09-12 LAB — BASIC METABOLIC PANEL
BUN: 16 mg/dL (ref 6–23)
Calcium: 9.4 mg/dL (ref 8.4–10.5)
Creatinine, Ser: 1.14 mg/dL — ABNORMAL HIGH (ref 0.50–1.10)
GFR calc Af Amer: 57 mL/min — ABNORMAL LOW (ref >60)
GFR calc non Af Amer: 47 mL/min — ABNORMAL LOW (ref >60)
Glucose, Bld: 97 mg/dL (ref 70–99)

## 2010-09-12 LAB — HEMOGLOBIN AND HEMATOCRIT, BLOOD: Hemoglobin: 12 g/dL (ref 12.0–15.0)

## 2010-09-16 ENCOUNTER — Encounter (HOSPITAL_BASED_OUTPATIENT_CLINIC_OR_DEPARTMENT_OTHER): Payer: Medicare PPO | Admitting: Internal Medicine

## 2010-09-16 ENCOUNTER — Ambulatory Visit (HOSPITAL_COMMUNITY)
Admission: RE | Admit: 2010-09-16 | Discharge: 2010-09-16 | Disposition: A | Discharge status: Home / Self Care | Payer: Medicare PPO | Source: Ambulatory Visit | Attending: Internal Medicine | Admitting: Internal Medicine

## 2010-09-16 ENCOUNTER — Other Ambulatory Visit (INDEPENDENT_AMBULATORY_CARE_PROVIDER_SITE_OTHER): Payer: Self-pay | Admitting: Internal Medicine

## 2010-09-16 DIAGNOSIS — I10 Essential (primary) hypertension: Secondary | ICD-10-CM | POA: Insufficient documentation

## 2010-09-16 DIAGNOSIS — K5730 Diverticulosis of large intestine without perforation or abscess without bleeding: Secondary | ICD-10-CM | POA: Insufficient documentation

## 2010-09-16 DIAGNOSIS — Z01812 Encounter for preprocedural laboratory examination: Secondary | ICD-10-CM | POA: Insufficient documentation

## 2010-09-16 DIAGNOSIS — K921 Melena: Secondary | ICD-10-CM | POA: Insufficient documentation

## 2010-09-16 DIAGNOSIS — K5289 Other specified noninfective gastroenteritis and colitis: Secondary | ICD-10-CM | POA: Insufficient documentation

## 2010-09-16 DIAGNOSIS — R197 Diarrhea, unspecified: Secondary | ICD-10-CM | POA: Insufficient documentation

## 2010-09-16 DIAGNOSIS — K5190 Ulcerative colitis, unspecified, without complications: Secondary | ICD-10-CM

## 2010-09-16 DIAGNOSIS — D126 Benign neoplasm of colon, unspecified: Secondary | ICD-10-CM | POA: Insufficient documentation

## 2010-09-16 DIAGNOSIS — D128 Benign neoplasm of rectum: Secondary | ICD-10-CM | POA: Insufficient documentation

## 2010-09-16 DIAGNOSIS — Z01810 Encounter for preprocedural cardiovascular examination: Secondary | ICD-10-CM | POA: Insufficient documentation

## 2010-09-16 DIAGNOSIS — R109 Unspecified abdominal pain: Secondary | ICD-10-CM | POA: Insufficient documentation

## 2010-09-16 DIAGNOSIS — Z79899 Other long term (current) drug therapy: Secondary | ICD-10-CM | POA: Insufficient documentation

## 2010-09-16 DIAGNOSIS — Z9229 Personal history of other drug therapy: Secondary | ICD-10-CM

## 2010-09-21 NOTE — Op Note (Signed)
[name], [name]                ACCOUNT NO.:  [id number]  MEDICAL RECORD NO.:  [id number]  LOCATION:  DAYP                          FACILITY:  [facility]  PHYSICIAN:  Hildred Laser, M.D.    DATE OF BIRTH:  1940/08/20  DATE OF PROCEDURE:  09/16/2010 DATE OF DISCHARGE:                              OPERATIVE REPORT   PROCEDURE:  Colonoscopy.  INDICATION:  [name] is a 70 year old Caucasian female with history of distal ulcerative colitis diagnosed about 4 years ago.  Her disease has been refractory to therapy.  She has been tried on multiple medications and currently on prednisone 10 mg daily.  She has never had a complete colonoscopy, although she has been attempted in the past here and elsewhere.  The patient is undergoing colonoscopy both for diagnostic and surveillance purposes.  Procedure risks were reviewed with the patient.  Informed consent was obtained.  MEDICATIONS FOR SEDATION/ANESTHESIA:  The patient was given MAC, pleasesee Anesthesia records.  FINDINGS:  Procedure was performed in the OR.  Once the patient was placed in left lateral position and once she was well sedated, rectal examination performed.  No abnormality noted on external or digital exam.  Pentax videoscope was placed through rectum where there was some virtual diffuse involvement of mucosa with multiple ulcers, friability and a polyp with narrow free attachment.  This was dealt with in the way out.  Mucosa was very friable.  These changes extended to 28 cm from the anal margin.  Mucosa above it was normal.  Preparation was satisfactory. She had scattered diverticula throughout the colon.  In order to pass the scope beyond the splenic flexure the patient was turned on the right side.  Scope was passed into cecum which was identified by appendiceal orifice and ileocecal valve.  There was a 6-mm sessile polyp behind ileocecal valve with difficult approach.  Was not able to snare tip.  It was cold biopsied and  residual polyp ablated with APC.  As the scope was withdrawn, rest of the colonic mucosa was carefully examined.  No other abnormalities were noted until back to the distal sigmoid colon. Multiple biopsies were taken from distal sigmoid mucosa as well as rectal mucosa and submitted in separate containers.  Rectal polyp was snared and retrieved for histologic examination.  There was somewhat narrow rectum and I was not able to retroflex the scope.  The distal rectum was well seen and no other abnormalities were noted.  Endoscope was withdrawn.  Withdrawal time was 29 minutes.  The patient tolerated the procedure well.  FINAL DIAGNOSES: 1. Examination performed to cecum. 2. Active disease with transition at 28 cm from anal margin.  Multiple     ulcers involving the rectal mucosa and distal sigmoid colon. 3. Scattered diverticula throughout the colon. 4. Small cecal polyp cold biopsied and residual treated with APC. 5. A 10-mm polyp snared from the rectum. 6. Biopsies taken from mucosa of sigmoid colorectum.  RECOMMENDATIONS: 1. She will resume her Lialda 2.4 g twice daily and we will increase     prednisone the 20 mg p.o. daily. 2. I will be contacting the patient results of biopsy and further  recommendations.          ______________________________ Hildred Laser, M.D.     NR/MEDQ  D:  09/16/2010  T:  09/16/2010  Job:  [id number]  cc:   Chipper Herb, M.D. Fax: [phone]  Electronically Signed by Hildred Laser M.D. on 09/21/2010 12:46:42 PM

## 2010-09-21 NOTE — H&P (Addendum)
[name], [name]                ACCOUNT NO.:  [id number]  MEDICAL RECORD NO.:  [id number]  LOCATION:                                 FACILITY:  PHYSICIAN:  Hildred Laser, M.D.    DATE OF BIRTH:  [birth year]  DATE OF ADMISSION: DATE OF DISCHARGE:  LH                             HISTORY & PHYSICAL   PRESENTING COMPLAINT:  Persistent diarrhea, rectal bleeding, and lower abdominal pain.  The patient with known history of ulcerative colitis.  HISTORY OF PRESENT ILLNESS.:  [name] is a 70 year old Caucasian female with known history of distal ulcerative colitis who is here for scheduled visit.  Her disease was diagnosed over 4 years ago.  She has never had control of her symptoms or in remission.  She was last seen in January this year.  Because of other illnesses, she was not able to keep her earlier appointments.  She states she was not able to continue 6-MP because it gave her intractable bloating.  She believes it increased her diarrhea and resulted in more bleeding.  Therefore, she discontinued 6- MP.  She went back on prednisone 5 days ago.  She states she has started to see improvement.  She says yesterday she had 7 stools in the day before she had 20 stools and today she has not had any stools.  Her pain is mainly located in left lower quadrant.  She has also noted decrease in amount of blood she is seeing with her bowel movements.  She noted fever about a week ago.  She says her appetite is fair.  She has not lost any weight since her last visit.  CURRENT MEDICATIONS: 1. Hydrocodone/APAP 5/500 one t.i.d. p.r.n. 2. Prednisone 20 mg p.o. daily. 3. Citalopram 10 mg p.o. daily. 4. Xanax 0.25 to 0.5 mg b.i.d. p.r.n. 5. Benazepril/HCT 20/12.5 daily. 6. Zofran 4 mg daily p.r.n. 7. Tramadol 50 mg daily p.r.n. 8. Zolpidem 10 mg p.o. at bedtime p.r.n. 9. Probiotic 1 b.i.d. 10.She is on vitamin A, E, C, B complex, and D daily. 11.CoQ10 one capsule daily. 12.Tylenol  p.r.n. 13.Fish oil 1 g p.o. daily. 14.Nexium 40 mg p.o. q.a.m.Marland Kitchen  ALLERGIES: 1. PENICILLIN. 2. SULFA. 3. MYCINS. 4. CODEINE. 5. LATEX. 6. AZATHIOPRINE. 7. 6-MP. 8. BETADINE.  PAST MEDICAL HISTORY:  Ulcerative colitis was diagnosed about 4 years ago.  She has distal disease.  She had a sigmoidoscopy by me in March of last year at [facility] and she had distal disease.  She had multiple attempts at colonoscopy at least 4 or maybe 5, but all of these have been incomplete.  Last year, I scheduled her for virtual colonoscopy, but this could not be completed because she could not even tolerate small volume of CO2.  She has been intolerant of 6-MP and azathioprine.  She had 3 doses of Remicade with a questionable benefit.  She has been on mesalamine off and on but without a good response.  She has hypertension, history of depression, and stress disorder.  She has had hysterectomy and cholecystectomy.  FAMILY HISTORY:  Negative for inflammatory bowel disease.  History positive for coronary artery disease in both parents and a brother. Another brother  has had CABG.  SOCIAL HISTORY:  She is widowed.  She is retired.  She does not smoke cigarettes or drink alcohol.  OBJECTIVE:  VITAL SIGNS:  Weight 144 pounds, she 69-1/2 inches tall, pulse 72 per minute, blood pressure 130/70, and temperature is 97.7. HEENT:  Conjunctivae are pink.  Sclerae are nonicteric.  Oropharyngeal mucosa is normal. NECK:  No neck masses or thyromegaly noted. CARDIAC:  Regular rhythm.  Normal S1 and S2.  No murmur or gallop noted. LUNGS:  Clear to auscultation. ABDOMEN:  Symmetrical.  Bowel sounds are normal.  On palpation, soft abdomen with mild tenderness in LLQ and hypogastric area without guarding.  No organomegaly or masses. RECTAL:  Deferred. EXTREMITIES:  No peripheral edema or clubbing noted.  ASSESSMENT:  [name] is a 70 year old Caucasian female with 4-year history of distal ulcerative colitis and she has  never been in remission.  She had flexible sigmoidoscopy by me in March last year and she had active disease involving the rectum and distal sigmoid colon with multiple ulceration and biopsy was consistent with ulcerative colitis.  She unfortunately has not been able to tolerate azathioprine or 6-MP.  She had either no or questionable benefit with Remicade.  Therefore, our choices are very limited.  I suspect she has irritable bowel syndrome in addition to her ulcerative colitis resulting in increased frequency of defecation.  She does not appear to be acutely ill.  She definitely needs to have her colon examined to make sure she does not have polyps and/or neoplasm and her disease activity could be reassessed.  Since conscious sedation has failed in the past, she will need either general anesthesia or heavy sedation with propofol.  RECOMMENDATIONS: 1. She will have CBC with BMET. 2. She will continue prednisone at 20 mg daily for another week or so     and thereafter start tapering dose by 5 mg every week.  New     prescription given for prednisone 10 mg tablets 100 without refill. 3. Trial with Lialda 2.4 g twice daily, samples given. 4. Diagnostic colonoscopy to be performed at [facility] with propofol or     anesthesia as deemed appropriate by Anesthesia team. 5. The patient also given prescription for hydrocodone/APAP 5/500 one     b.i.d. p.r.n. 60 without refill.                                           ______________________________ Hildred Laser, M.D.     NR/MEDQ  D:  08/26/2010  T:  08/27/2010  Job:  [id number]  cc:   Chipper Herb, M.D. Fax: [phone]  Electronically Signed by Hildred Laser M.D. on 09/30/2010 09:39:30 PM

## 2010-10-28 ENCOUNTER — Encounter: Payer: Self-pay | Admitting: Cardiology

## 2010-11-04 ENCOUNTER — Emergency Department (HOSPITAL_COMMUNITY): Payer: Medicare PPO

## 2010-11-04 ENCOUNTER — Encounter (HOSPITAL_COMMUNITY): Payer: Self-pay | Admitting: *Deleted

## 2010-11-04 ENCOUNTER — Inpatient Hospital Stay (HOSPITAL_COMMUNITY)
Admission: EM | Admit: 2010-11-04 | Discharge: 2010-11-07 | DRG: 387 | Disposition: A | Discharge status: Home / Self Care | Payer: Medicare PPO | Attending: Internal Medicine | Admitting: Internal Medicine

## 2010-11-04 DIAGNOSIS — F329 Major depressive disorder, single episode, unspecified: Secondary | ICD-10-CM | POA: Diagnosis present

## 2010-11-04 DIAGNOSIS — K625 Hemorrhage of anus and rectum: Secondary | ICD-10-CM

## 2010-11-04 DIAGNOSIS — IMO0001 Reserved for inherently not codable concepts without codable children: Secondary | ICD-10-CM | POA: Diagnosis present

## 2010-11-04 DIAGNOSIS — I10 Essential (primary) hypertension: Secondary | ICD-10-CM | POA: Diagnosis present

## 2010-11-04 DIAGNOSIS — R109 Unspecified abdominal pain: Secondary | ICD-10-CM

## 2010-11-04 DIAGNOSIS — F32A Depression, unspecified: Secondary | ICD-10-CM

## 2010-11-04 DIAGNOSIS — K5150 Left sided colitis without complications: Secondary | ICD-10-CM | POA: Diagnosis present

## 2010-11-04 DIAGNOSIS — M797 Fibromyalgia: Secondary | ICD-10-CM

## 2010-11-04 DIAGNOSIS — F3289 Other specified depressive episodes: Secondary | ICD-10-CM | POA: Diagnosis present

## 2010-11-04 DIAGNOSIS — K922 Gastrointestinal hemorrhage, unspecified: Secondary | ICD-10-CM

## 2010-11-04 DIAGNOSIS — K5190 Ulcerative colitis, unspecified, without complications: Principal | ICD-10-CM | POA: Diagnosis present

## 2010-11-04 HISTORY — DX: Fibromyalgia: M79.7

## 2010-11-04 LAB — BASIC METABOLIC PANEL
BUN: 9 mg/dL (ref 6–23)
Calcium: 10.1 mg/dL (ref 8.4–10.5)
Creatinine, Ser: 0.84 mg/dL (ref 0.50–1.10)
GFR calc non Af Amer: >60 mL/min (ref >60)
Glucose, Bld: 127 mg/dL — ABNORMAL HIGH (ref 70–99)
Potassium: 4.4 mEq/L (ref 3.5–5.1)

## 2010-11-04 LAB — OCCULT BLOOD, POC DEVICE: Fecal Occult Bld: POSITIVE

## 2010-11-04 LAB — URINALYSIS, ROUTINE W REFLEX MICROSCOPIC
Bilirubin Urine: NEGATIVE
Specific Gravity, Urine: 1.01 (ref 1.005–1.030)
Urobilinogen, UA: 0.2 mg/dL (ref 0.0–1.0)

## 2010-11-04 LAB — URINE MICROSCOPIC-ADD ON

## 2010-11-04 LAB — CBC
HCT: 40.7 % (ref 36.0–46.0)
Hemoglobin: 13 g/dL (ref 12.0–15.0)
MCH: 29.8 pg (ref 26.0–34.0)
MCHC: 31.9 g/dL (ref 30.0–36.0)
MCV: 93.3 fL (ref 78.0–100.0)
RBC: 4.36 MIL/uL (ref 3.87–5.11)

## 2010-11-04 LAB — DIFFERENTIAL
Eosinophils Absolute: 0 10*3/uL (ref 0.0–0.7)
Eosinophils Relative: 0 % (ref 0–5)
Lymphs Abs: 0.4 10*3/uL — ABNORMAL LOW (ref 0.7–4.0)
Monocytes Absolute: 0.2 10*3/uL (ref 0.1–1.0)
Monocytes Relative: 2 % — ABNORMAL LOW (ref 3–12)

## 2010-11-04 LAB — TYPE AND SCREEN: Antibody Screen: NEGATIVE

## 2010-11-04 LAB — HEPATIC FUNCTION PANEL
AST: 20 U/L (ref 0–37)
Bilirubin, Direct: 0.2 mg/dL (ref 0.0–0.3)
Indirect Bilirubin: 0.6 mg/dL (ref 0.3–0.9)
Total Bilirubin: 0.8 mg/dL (ref 0.3–1.2)

## 2010-11-04 MED ORDER — SODIUM CHLORIDE 0.9 % IV SOLN
999.0000 mL | INTRAVENOUS | Status: DC
  Administered 2010-11-04: 999 mL via INTRAVENOUS
  Administered 2010-11-04: 1000 mL via INTRAVENOUS
  Administered 2010-11-05: 999 mL via INTRAVENOUS
  Administered 2010-11-05 – 2010-11-06 (×2): via INTRAVENOUS
  Administered 2010-11-06: 999 mL via INTRAVENOUS

## 2010-11-04 MED ORDER — SODIUM CHLORIDE 0.9 % IV BOLUS (SEPSIS)
500.0000 mL | Freq: Once | INTRAVENOUS | Status: AC
  Administered 2010-11-04: 1000 mL via INTRAVENOUS

## 2010-11-04 MED ORDER — PETROLATUM-ZINC OXIDE 49-15 % EX OINT
1.0000 "application " | TOPICAL_OINTMENT | Freq: Two times a day (BID) | CUTANEOUS | Status: DC
  Administered 2010-11-05 – 2010-11-06 (×4): 1 via TOPICAL

## 2010-11-04 MED ORDER — OMEGA-3-ACID ETHYL ESTERS 1 G PO CAPS
1.0000 | ORAL_CAPSULE | Freq: Every day | ORAL | Status: DC
  Administered 2010-11-05 – 2010-11-07 (×3): 1 g via ORAL
  Filled 2010-11-04 (×3): qty 1

## 2010-11-04 MED ORDER — ONDANSETRON HCL 4 MG/2ML IJ SOLN
4.0000 mg | Freq: Four times a day (QID) | INTRAMUSCULAR | Status: DC | PRN
  Administered 2010-11-04 – 2010-11-05 (×3): 4 mg via INTRAVENOUS
  Filled 2010-11-04 (×3): qty 2

## 2010-11-04 MED ORDER — PROBIOTIC FORMULA PO CAPS
1.0000 | ORAL_CAPSULE | Freq: Two times a day (BID) | ORAL | Status: DC
  Filled 2010-11-04 (×2): qty 1

## 2010-11-04 MED ORDER — METHYLPREDNISOLONE SODIUM SUCC 125 MG IJ SOLR
125.0000 mg | Freq: Three times a day (TID) | INTRAMUSCULAR | Status: DC
  Administered 2010-11-04 – 2010-11-05 (×4): 125 mg via INTRAVENOUS
  Filled 2010-11-04 (×4): qty 2

## 2010-11-04 MED ORDER — HYDROCHLOROTHIAZIDE 12.5 MG PO CAPS
12.5000 mg | ORAL_CAPSULE | Freq: Every day | ORAL | Status: DC
  Administered 2010-11-05 – 2010-11-07 (×3): 12.5 mg via ORAL
  Filled 2010-11-04 (×4): qty 1

## 2010-11-04 MED ORDER — BENAZEPRIL-HYDROCHLOROTHIAZIDE 20-12.5 MG PO TABS: 1.0000 | ORAL_TABLET | Freq: Every day | ORAL | Status: DC

## 2010-11-04 MED ORDER — VITAMIN D 1000 UNITS PO TABS
1000.0000 [IU] | ORAL_TABLET | Freq: Every day | ORAL | Status: DC
  Administered 2010-11-05 – 2010-11-06 (×2): 1000 [IU] via ORAL
  Filled 2010-11-04 (×9): qty 1

## 2010-11-04 MED ORDER — ONDANSETRON HCL 4 MG PO TABS: 4.0000 mg | ORAL_TABLET | Freq: Four times a day (QID) | ORAL | Status: DC | PRN

## 2010-11-04 MED ORDER — BENAZEPRIL HCL 10 MG PO TABS
20.0000 mg | ORAL_TABLET | Freq: Every day | ORAL | Status: DC
  Administered 2010-11-05 – 2010-11-07 (×3): 20 mg via ORAL
  Filled 2010-11-04 (×4): qty 2

## 2010-11-04 MED ORDER — ACETAMINOPHEN 500 MG PO TABS: 500.0000 mg | ORAL_TABLET | Freq: Four times a day (QID) | ORAL | Status: DC | PRN

## 2010-11-04 MED ORDER — PANTOPRAZOLE SODIUM 40 MG IV SOLR
40.0000 mg | INTRAVENOUS | Status: DC
  Administered 2010-11-04 – 2010-11-06 (×3): 40 mg via INTRAVENOUS
  Filled 2010-11-04 (×3): qty 40

## 2010-11-04 MED ORDER — ONDANSETRON HCL 4 MG/2ML IJ SOLN
4.0000 mg | Freq: Once | INTRAMUSCULAR | Status: AC
  Administered 2010-11-04: 4 mg via INTRAVENOUS
  Filled 2010-11-04: qty 2

## 2010-11-04 MED ORDER — ZOLPIDEM TARTRATE 5 MG PO TABS: 10.0000 mg | ORAL_TABLET | Freq: Every evening | ORAL | Status: DC | PRN

## 2010-11-04 MED ORDER — ALPRAZOLAM 0.5 MG PO TABS: 0.5000 mg | ORAL_TABLET | Freq: Two times a day (BID) | ORAL | Status: DC | PRN

## 2010-11-04 MED ORDER — ESCITALOPRAM OXALATE 10 MG PO TABS
20.0000 mg | ORAL_TABLET | Freq: Every day | ORAL | Status: DC
  Administered 2010-11-04 – 2010-11-06 (×3): 20 mg via ORAL
  Filled 2010-11-04 (×3): qty 2

## 2010-11-04 MED ORDER — HYDROMORPHONE HCL 1 MG/ML IJ SOLN
0.5000 mg | INTRAMUSCULAR | Status: AC | PRN
  Administered 2010-11-04 – 2010-11-05 (×3): 0.5 mg via INTRAVENOUS
  Filled 2010-11-04 (×3): qty 1

## 2010-11-04 MED ORDER — HYDROCODONE-ACETAMINOPHEN 5-325 MG PO TABS
1.0000 | ORAL_TABLET | ORAL | Status: DC | PRN
  Administered 2010-11-05 – 2010-11-07 (×2): 1 via ORAL
  Filled 2010-11-04 (×4): qty 1

## 2010-11-04 NOTE — H&P (Signed)
[name] MRN: [id number] DOB/AGE: 69/69/1942 69 y.o. Primary Care Physician:Steadman, Nicholaus Corolla, OTR, [place] Admit date: 11/04/2010 Chief Complaint: Abdominal pain and rectal bleeding. HPI: This 70 year old lady, who has been diagnosed with ulcerative colitis approximately 4 years ago presents with a two-week history of the above symptoms. She tells me that for the last 2 weeks she has opened her bowels every 2 hours and she often passes a small amount of blood preceded by significant abdominal pain. She has had no nausea or vomiting of any significance. She did have a colonoscopy by Dr Hildred Laser in June 2012 which confirmed the presence of active colitis. He started her on mesalazine and she continued her prednisone. Unfortunately, she was not able to tolerate mesalazine. He had offered her intravenous Remicade but in the meantime she has had teeth surgery and was unable to take it. Therefore she has remained on prednisone since this time. She was evaluated in the emergency room today and a plain abdominal x-rays of her abdomen are suggestive of active colitis.    Past medical history: 1. Left-sided ulcerative colitis diagnosed in 2009. 2. Hypertension. 3. Fibromyalgia. 4. Depression.  Past surgical history: 1. Abdominal hysterectomy. 2. Cholecystectomy.      Family history: Noncontributory.   Social history: She has been a widower for the last 110 years and lives alone. She does not smoke and does not particularly drink alcohol. She is currently unemployed.  Allergies:  Allergies  Allergen Reactions  . Azathioprine Nausea And Vomiting and Other (See Comments)    SEVERE NAUSEA AND VOMITING WITH CHEST PAIN-  PATIENT INSISTS NEVER TO BE GIVEN ANYTHING SIMILAR TO THIS  . Tape Other (See Comments)    BLISTERS AND SKIN TEARING  . Codeine Nausea And Vomiting  . Novocain Other (See Comments)    SEVERE GI UPSET (NAUSEA,VOMITING AND DIARRHEA)  . Penicillins Hives  .  Povidone-Iodine Hives    BETADINE  . Sulfonamide Derivatives Hives  . Latex Rash  . Niacin And Related Rash    Medications Prior to Admission  Medication Dose Route Frequency Provider Last Rate Last Dose  . 0.9 %  sodium chloride infusion  999 mL Intravenous Continuous Lenox Ahr, PA 100 mL/hr at 11/04/10 1351 1,000 mL at 11/04/10 1351  . HYDROmorphone (DILAUDID) injection 0.5 mg  0.5 mg Intravenous Q2H PRN Lenox Ahr, PA   0.5 mg at 11/04/10 1324  . ondansetron (ZOFRAN) injection 4 mg  4 mg Intravenous Once Lenox Ahr, PA   4 mg at 11/04/10 1322  . sodium chloride 0.9 % bolus 500 mL  500 mL Intravenous Once Lenox Ahr, PA   1,000 mL at 11/04/10 1300   Medications Prior to Admission  Medication Sig Dispense Refill  . acetaminophen (TYLENOL) 500 MG tablet Take 500 mg by mouth every 6 (six) hours as needed. FOR PAIN      . B Complex CAPS Take 1 capsule by mouth daily.        . benazepril-hydrochlorthiazide (LOTENSIN HCT) 20-12.5 MG per tablet Take 1 tablet by mouth daily.        Marland Kitchen HYDROcodone-acetaminophen (VICODIN) 5-500 MG per tablet Take 1 tablet by mouth every 6 (six) hours as needed.        . predniSONE (DELTASONE) 10 MG tablet Take 15 mg by mouth as directed. TAKE ONE AND ONE HALF TABLET ONCE DAILY      . mesalamine (LIALDA) 1.2 G EC tablet Take by mouth. Take 4 pills  once a day       . saccharomyces boulardii (FLORASTOR) 250 MG capsule Take 250 mg by mouth daily.        . Vitamins A C E-Zn CAPS Take 1 capsule by mouth daily.             MLY:YTKPT from the symptoms mentioned above,there are no other symptoms referable to all systems reviewed.  Physical Exam: Blood pressure 102/56, pulse 87, temperature 97.8 F (36.6 C), temperature source Oral, resp. rate 20, height 5' 8"  (1.727 m), weight 63.504 kg (140 lb), SpO2 100.00%. She is clearly in pain. However, she does not look toxic/septic. There is no clubbing, jaundice, clinical pallor. Abdomen: Soft but tender  in a generalized fashion. There are no masses. There is no hepatosplenomegaly. Bowel sounds are heard but scanty. Cardiovascular: Heart sounds are present without murmurs. Lung fields are clear. Neurological: Within normal limits. Skin: Within normal limits. Musculoskeletal: No abnormalities.  Results for orders placed during the hospital encounter of 11/04/10 (from the past 48 hour(s))  CBC     Status: Normal   Collection Time   11/04/10 12:55 PM      Component Value Range Comment   WBC 10.4  4.0 - 10.5 (K/uL)    RBC 4.36  3.87 - 5.11 (MIL/uL)    Hemoglobin 13.0  12.0 - 15.0 (g/dL)    HCT 40.7  36.0 - 46.0 (%)    MCV 93.3  78.0 - 100.0 (fL)    MCH 29.8  26.0 - 34.0 (pg)    MCHC 31.9  30.0 - 36.0 (g/dL)    RDW 13.4  11.5 - 15.5 (%)    Platelets 350  150 - 400 (K/uL)   DIFFERENTIAL     Status: Abnormal   Collection Time   11/04/10 12:55 PM      Component Value Range Comment   Neutrophils Relative 94 (*) 43 - 77 (%)    Neutro Abs 9.7 (*) 1.7 - 7.7 (K/uL)    Lymphocytes Relative 4 (*) 12 - 46 (%)    Lymphs Abs 0.4 (*) 0.7 - 4.0 (K/uL)    Monocytes Relative 2 (*) 3 - 12 (%)    Monocytes Absolute 0.2  0.1 - 1.0 (K/uL)    Eosinophils Relative 0  0 - 5 (%)    Eosinophils Absolute 0.0  0.0 - 0.7 (K/uL)    Basophils Relative 0  0 - 1 (%)    Basophils Absolute 0.0  0.0 - 0.1 (K/uL)   BASIC METABOLIC PANEL     Status: Abnormal   Collection Time   11/04/10  1:37 PM      Component Value Range Comment   Sodium 137  135 - 145 (mEq/L)    Potassium 4.4  3.5 - 5.1 (mEq/L)    Chloride 99  96 - 112 (mEq/L)    CO2 24  19 - 32 (mEq/L)    Glucose, Bld 127 (*) 70 - 99 (mg/dL)    BUN 9  6 - 23 (mg/dL)    Creatinine, Ser 0.84  0.50 - 1.10 (mg/dL)    Calcium 10.1  8.4 - 10.5 (mg/dL)    GFR calc non Af Amer >60  >60 (mL/min)    GFR calc Af Amer >60  >60 (mL/min)   HEPATIC FUNCTION PANEL     Status: Abnormal   Collection Time   11/04/10  1:37 PM      Component Value Range Comment   Total Protein 7.2  6.0 - 8.3 (g/dL)    Albumin 3.4 (*) 3.5 - 5.2 (g/dL)    AST 20  0 - 37 (U/L)    ALT 10  0 - 35 (U/L)    Alkaline Phosphatase 83  39 - 117 (U/L)    Total Bilirubin 0.8  0.3 - 1.2 (mg/dL)    Bilirubin, Direct 0.2  0.0 - 0.3 (mg/dL)    Indirect Bilirubin 0.6  0.3 - 0.9 (mg/dL)   TYPE AND SCREEN     Status: Normal   Collection Time   11/04/10  1:37 PM      Component Value Range Comment   ABO/RH(D) A POS      Antibody Screen NEG      Sample Expiration 11/07/2010        Dg Abd Acute W/chest  11/04/2010  *RADIOLOGY REPORT*  Clinical Data: Nominal pain, rectal bleeding, history of ulcerative colitis, question free air  ACUTE ABDOMEN SERIES (ABDOMEN 2 VIEW & CHEST 1 VIEW)  Comparison: Chest radiograph 03/15/2008, abdominal radiographs 06/28/2006  Findings: Upper-normal size of cardiac silhouette. Mediastinal contours and pulmonary vascularity normal. Lungs appear mildly emphysematous but clear. No pleural effusion or pneumothorax. Mild osseous demineralization. Surgical clips right upper quadrant question cholecystectomy. Gas identified in nondistended colon with probable wall thickening of sigmoid, descending and distal transverse colon, which could be seen with ulcerative colitis. Small bowel loops right mid abdomen are nondistended and show no wall thickening. No urinary tract calcification. Single phlebolith and surgical clip in the right pelvis. No free intraperitoneal air or evidence of bowel obstruction.  IMPRESSION: Bowel wall thickening involving distal transverse through sigmoid colon, which could be seen with ulcerative colitis or colitis of any etiology. No definite evidence of bowel perforation or obstruction.  Original Report Authenticated By: Burnetta Sabin, M.D.   Impression: 1. Acute exacerbation of ulcerative colitis. 2. Hypertension. 3. Depression. 4. Fibromyalgia.     Plan: 1. Admit to general medical floor. 2. Start intravenous steroids. 3. Consult gastroenterology with Dr  Hildred Laser.      [place] C 11/04/2010, 3:45 PM

## 2010-11-04 NOTE — Progress Notes (Signed)
Case discussed with Dr Edsel Petrin. He will see pt in ED for admission.

## 2010-11-04 NOTE — Progress Notes (Signed)
Case discussed with Dr Hazle Nordmann, and Dr Rihmon(GI). Dr Karlene Lineman reviewed June colonoscopy and todays labs. He suggest pt could be admitted by hospitalist service. He will consult.

## 2010-11-04 NOTE — ED Notes (Signed)
the patient brought in by EMS complains of lower abdominal pain, nausea. Denies any vomiting. the patient states she has had dark red loose stools for two weeks. the patient states she had a coloscopy in June. the patient states she has Ulcerative Colitis. No active bleeding at this time

## 2010-11-04 NOTE — ED Provider Notes (Signed)
History     CSN: [id number] Arrival date & time: 11/04/2010 12:24 PM  Chief Complaint  Patient presents with  . Abdominal Pain   HPI Comments: Pt states she has colitis, she was seen in June 2012 by GI. Colonoscopy revealed "a problem with my colon and my rectum".  She reports 2 weeks of dark stools. Today she noted worsening of her lower abd pain. She then noted blood in underwear and running down leg. She felt as if she would pass out due to the pain. She went to the bathroom and noted a commode full of blood. She called her GI office as was told to come to the ED. She denies blood thinners. She denies Goodie powders or NSAIDS.  Patient is a 70 y.o. female presenting with GI illlness. The history is provided by the patient.  GI Problem  This is a recurrent problem. The current episode started more than 1 week ago. The problem occurs 5 to 10 times per day. The problem has been gradually worsening. The stool consistency is described as bloody. Associated symptoms include abdominal pain, chills, arthralgias and myalgias. Pertinent negatives include no vomiting, no sweats and no cough. Treatments tried: Her own medications. Her past medical history is significant for irritable bowel syndrome and inflammatory bowel disease.    Past Medical History  Diagnosis Date  . Hypertension   . Ulcerative colitis   . Fibromyalgia     Past Surgical History  Procedure Date  . Cholecystectomy     s/p  . Colonoscopy 06/16/06. 04/27/2007    proctitis seen, biopsies showed chronic active colitis, no dysplasia                                                                                                                                                                                                                                                                                                                                              .  Abdominal hysterectomy     History reviewed. No pertinent family  history.  History  Substance Use Topics  . Smoking status: Never Smoker   . Smokeless tobacco: Not on file  . Alcohol Use: Yes    OB History    Grav Para Term Preterm Abortions TAB SAB Ect Mult Living                  Review of Systems  Constitutional: Positive for chills. Negative for activity change.       All ROS Neg except as noted in HPI  HENT: Negative for nosebleeds and neck pain.   Eyes: Negative for photophobia and discharge.  Respiratory: Negative for cough, shortness of breath and wheezing.   Cardiovascular: Negative for chest pain and palpitations.  Gastrointestinal: Positive for abdominal pain and blood in stool. Negative for vomiting.  Genitourinary: Negative for dysuria, frequency and hematuria.  Musculoskeletal: Positive for myalgias and arthralgias. Negative for back pain.  Skin: Negative.   Neurological: Negative for dizziness, seizures and speech difficulty.  Psychiatric/Behavioral: Negative for hallucinations and confusion.    Physical Exam  BP 170/87  Pulse 94  Temp(Src) 97.8 F (36.6 C) (Oral)  Resp 20  Ht 5' 8"  (1.727 m)  Wt 140 lb (63.504 kg)  BMI 21.29 kg/m2  SpO2 98%  Physical Exam  Nursing note and vitals reviewed. Constitutional: She is oriented to person, place, and time. She appears well-developed and well-nourished.  Non-toxic appearance.  HENT:  Head: Normocephalic.  Right Ear: Tympanic membrane and external ear normal.  Left Ear: Tympanic membrane and external ear normal.  Eyes: EOM and lids are normal. Pupils are equal, round, and reactive to light.  Neck: Normal range of motion. Neck supple. Carotid bruit is not present.  Cardiovascular: Normal rate, regular rhythm, normal heart sounds, intact distal pulses and normal pulses.   Pulmonary/Chest: Breath sounds normal. No respiratory distress.  Abdominal: Soft. Bowel sounds are normal. There is tenderness. There is guarding.       Rt and left lower quad abd pain.   Genitourinary:         Chaperone present during exam. External rectal area wnl. Rectum painful to examination. Dark and bright red blood on gloved finger. No mass appreciated.  Musculoskeletal: Normal range of motion.  Lymphadenopathy:       Head (right side): No submandibular adenopathy present.       Head (left side): No submandibular adenopathy present.    She has no cervical adenopathy.  Neurological: She is alert and oriented to person, place, and time. She has normal strength. No cranial nerve deficit or sensory deficit.  Skin: Skin is warm and dry.  Psychiatric: She has a normal mood and affect. Her speech is normal.    ED Course  Procedures  MDM I have reviewed nursing notes, vital signs, and all appropriate lab and imaging results for this patient.      Lenox Ahr, Roy 11/04/10 [place], [place] 11/04/10 [phone]

## 2010-11-04 NOTE — ED Notes (Addendum)
Has daily mild abdominal cramping with bowel movements due to her ulcerative colitis. She has been on prednisone for some time and has discussed with her family and GI doctors regarding use of other immunosuppressive medicines. She states that today she had excessive in increased amounts of bleeding from her rectum during bowel movements and increased pain  Physical exam:Patient has mild to moderate lower, with tenderness to palpation, no tachycardia or difficulty breathing, no edema to her lower extremity. Please see rectal exam per mid-level provider.  Assessment and plan:Patient has no significant edema but has ongoing rectal bleeding which is more than usual and increased cramping. Will admit for further evaluation. We'll also start on GI prophylaxis related to use of prednisone and GI bleeding.  Johnna Acosta, MD 11/04/10 1620  Johnna Acosta, MD 11/30/10 1501

## 2010-11-04 NOTE — ED Provider Notes (Addendum)
Medical screening examination/treatment/procedure(s) were conducted as a shared visit with non-physician practitioner(s) and myself.  I personally evaluated the patient during the encounter   [name] Acosta, MD 11/04/10 1620  [name] Acosta, MD 11/19/10 [phone]

## 2010-11-05 LAB — COMPREHENSIVE METABOLIC PANEL
Alkaline Phosphatase: 57 U/L (ref 39–117)
BUN: 8 mg/dL (ref 6–23)
Calcium: 9 mg/dL (ref 8.4–10.5)
Creatinine, Ser: 0.67 mg/dL (ref 0.50–1.10)
GFR calc Af Amer: >60 mL/min (ref >60)
Glucose, Bld: 168 mg/dL — ABNORMAL HIGH (ref 70–99)
Total Protein: 6 g/dL (ref 6.0–8.3)

## 2010-11-05 LAB — CBC
HCT: 36.8 % (ref 36.0–46.0)
Hemoglobin: 12.3 g/dL (ref 12.0–15.0)
RBC: 4 MIL/uL (ref 3.87–5.11)
RDW: 13.3 % (ref 11.5–15.5)
WBC: 4.5 10*3/uL (ref 4.0–10.5)

## 2010-11-05 MED ORDER — HYDROMORPHONE HCL 1 MG/ML IJ SOLN
2.0000 mg | INTRAMUSCULAR | Status: DC | PRN
  Administered 2010-11-05 – 2010-11-06 (×3): 2 mg via INTRAVENOUS
  Administered 2010-11-06: 1 mg via INTRAVENOUS
  Administered 2010-11-07: 2 mg via INTRAVENOUS
  Filled 2010-11-05: qty 2
  Filled 2010-11-05: qty 1
  Filled 2010-11-05: qty 2
  Filled 2010-11-05 (×4): qty 1

## 2010-11-05 MED ORDER — SACCHAROMYCES BOULARDII 250 MG PO CAPS
250.0000 mg | ORAL_CAPSULE | Freq: Two times a day (BID) | ORAL | Status: DC
  Administered 2010-11-05 – 2010-11-07 (×5): 250 mg via ORAL
  Filled 2010-11-05 (×5): qty 1

## 2010-11-05 MED ORDER — SODIUM CHLORIDE 0.9 % IJ SOLN
INTRAMUSCULAR | Status: AC
  Administered 2010-11-05: 18:00:00
  Filled 2010-11-05: qty 10

## 2010-11-05 MED ORDER — METHYLPREDNISOLONE SODIUM SUCC 40 MG IJ SOLR
30.0000 mg | Freq: Two times a day (BID) | INTRAMUSCULAR | Status: DC
  Administered 2010-11-05 – 2010-11-07 (×4): 30 mg via INTRAVENOUS
  Filled 2010-11-05 (×4): qty 1

## 2010-11-05 NOTE — Progress Notes (Signed)
  Subjective: Still with significant abdominal pain although thinks her rectal bleeding has improved.  Objective: Vital signs in last 24 hours: Temp:  [97.6 F (36.4 C)-98.1 F (36.7 C)] 97.6 F (36.4 C) (08/15 0532) Pulse Rate:  [58-94] 58  (08/15 0532) Resp:  [17-20] 20  (08/15 0532) BP: (102-170)/(56-87) 146/81 mmHg (08/15 0532) SpO2:  [95 %-100 %] 99 % (08/15 0532) Weight:  [64.1 kg (141 lb 5 oz)] 141 lb 5 oz (64.1 kg) (08/14 1844) Weight change:  Last BM Date: 11/05/10  Intake/Output from previous day: 08/14 0701 - 08/15 0700 In: 1180 [I.V.:1180] Out: -    Physical Exam: General: Alert, awake, oriented x3, in no acute distress. HEENT: No bruits, no goiter. Heart: Regular rate and rhythm, without murmurs, rubs, gallops. Lungs: Clear to auscultation bilaterally. Abdomen: Soft, TTP L>R lower quadrants, nondistended, positive bowel sounds. Extremities: No clubbing cyanosis or edema with positive pedal pulses. Neuro: Grossly intact, nonfocal.    Lab Results:  Basename 11/05/10 0548 11/04/10 1255  WBC 4.5 10.4  HGB 12.3 13.0  HCT 36.8 40.7  PLT 337 350    Basename 11/05/10 0548 11/04/10 1337  NA 137 137  K 4.2 4.4  CL 104 99  CO2 21 24  GLUCOSE 168* 127*  BUN 8 9  CREATININE 0.67 0.84  CALCIUM 9.0 10.1    Studies/Results: Dg Abd Acute W/chest  11/04/2010  *RADIOLOGY REPORT*  Clinical Data: Nominal pain, rectal bleeding, history of ulcerative colitis, question free air  ACUTE ABDOMEN SERIES (ABDOMEN 2 VIEW & CHEST 1 VIEW)  Comparison: Chest radiograph 03/15/2008, abdominal radiographs 06/28/2006  Findings: Upper-normal size of cardiac silhouette. Mediastinal contours and pulmonary vascularity normal. Lungs appear mildly emphysematous but clear. No pleural effusion or pneumothorax. Mild osseous demineralization. Surgical clips right upper quadrant question cholecystectomy. Gas identified in nondistended colon with probable wall thickening of sigmoid, descending  and distal transverse colon, which could be seen with ulcerative colitis. Small bowel loops right mid abdomen are nondistended and show no wall thickening. No urinary tract calcification. Single phlebolith and surgical clip in the right pelvis. No free intraperitoneal air or evidence of bowel obstruction.  IMPRESSION: Bowel wall thickening involving distal transverse through sigmoid colon, which could be seen with ulcerative colitis or colitis of any etiology. No definite evidence of bowel perforation or obstruction.  Original Report Authenticated By: Burnetta Sabin, M.D.    Medications: I have reviewed the patient's current medications.  Assessment/Plan:  1. ULCERATIVE COLITIS, LEFT SIDED: She believes her rectal bleeding has improved. We'll continue current dose of steroids pending Dr. Olevia Perches recommendations.  2.  HYPERTENSION: Well controlled. Continue current medications. 3.  Depression    LOS: 1 day   HERNANDEZ ACOSTA,ESTELA 11/05/2010, 12:28 PM

## 2010-11-05 NOTE — Progress Notes (Signed)
[name] is 70 year old Caucasian female is admitted to hospitalist service yesterday via emergency room where she presented with lower GI bleed. He has chronic ulcerative colitis unresponsive to oral mesalamine therapy. She has been intolerant of  6 MP. She also has not been able to tolerate topical therapy with suppositories or enemas. He has been evaluated at [facility] Dr. Cheryll Cockayne and declined to have proctocolectomy and permanent ileostomy. She has been on prednisone off and on for several months prior to this omission she was on 20 mg daily. Patient has undergone multiple sigmoidoscopies in the past and she finally underwent colonoscopy on 09/16/2010 reveal a distal disease transition at 28 cm from the anal margin. She had scattered diverticula throughout the colon. Biopsy from sigmoid colon and rectum confirmed active disease. No evidence of superadded CMV colitis. She is small polyp removed from her cecum which was an adenoma polyp from rectum was inflammatory. Plans were made for her to start Remicade infusion. Her PPD has been negative. His therapy was postponed because she had some dental problems. Yesterday she asked large amount of bright red blood per rectum and came to the emergency room. He has been on 20 mg of prednisone the last several weeks. She was begun on Solu-Medrol at 125 mg every 6 hours. Today she is at 2 bowel movements with blood with both of them but not as much as yesterday. He complains of headache pain in her teeth as well as lower extremity pain. She blames all of these symptoms due to steroids. She also could not sleep last night. At home she has these 10-12 bowel movements per day and these are small volume. Review of the systems is negative for fever chills nausea or vomiting; complains of pain in hypogastric region. Scheduled Meds:   . benazepril  20 mg Oral Daily   And  . hydrochlorothiazide  12.5 mg Oral Daily  . cholecalciferol  1,000 Units  Oral Daily  . escitalopram  20 mg Oral QHS  . methylPREDNISolone sodium succinate  30 mg Intravenous Q12H  . omega-3 acid ethyl esters  1 capsule Oral Daily  . pantoprazole (PROTONIX) IV  40 mg Intravenous Q24H  . Petrolatum-Zinc Oxide  1 application Topical BID  . saccharomyces boulardii  250 mg Oral BID  . sodium chloride      . DISCONTD: benazepril-hydrochlorthiazide  1 tablet Oral Daily  . DISCONTD: methylPREDNISolone sodium succinate  125 mg Intravenous Q8H  . DISCONTD: PROBIOTIC FORMULA  1 capsule Oral BID   Continuous Infusions:   . sodium chloride 999 mL (11/05/10 0917)   PRN Meds:.acetaminophen, ALPRAZolam, HYDROcodone-acetaminophen, HYDROmorphone (DILAUDID) injection, ondansetron (ZOFRAN) IV, ondansetron, zolpidem Objective BP 123/62  Pulse 69  Temp(Src) 98.4 F (36.9 C) (Oral)  Resp 21  Ht 5' 8"  (1.727 m)  Wt 141 lb 5 oz (64.1 kg)  BMI 21.49 kg/m2  SpO2 97% Conjunctiva is pink sclerae nonicteric no neck masses are noted Abdomen is symmetrical bowel sounds are normal on palpation soft abdomen with mild tenderness in epigastric region. Peripheral edema noted. Lab data 11/04/2018 ; WBC 10.4 hemoglobin, 13 hematocrit 40.7, MCV 93.3 and platelet count 350 K. CBC from this morning; WBC 4.5, hemoglobin 12.3, hematocrit 36.8 platelet count 3:30 7K Assessment; [name] has chronic  distal ulcerative colitis refractory to oral mesalamine   The disease has not been fulminant but has been source of significant suffering. Next option is induction therapy with Remicade hopefully she can tolerate it and it would work  otherwise she would have to give serious consideration to surgical intervention. she would end up with osteoporosis and other problems with chronic steroid therapy. Recommendations; Decrease Solu-Medrol dose to 30 mg IV 12 hours. An outpatient Remicade infusion study early next week following discharge.

## 2010-11-06 DIAGNOSIS — K625 Hemorrhage of anus and rectum: Secondary | ICD-10-CM

## 2010-11-06 DIAGNOSIS — K5190 Ulcerative colitis, unspecified, without complications: Secondary | ICD-10-CM

## 2010-11-06 LAB — BASIC METABOLIC PANEL
BUN: 11 mg/dL (ref 6–23)
Chloride: 105 mEq/L (ref 96–112)
GFR calc Af Amer: >60 mL/min (ref >60)
Potassium: 3.9 mEq/L (ref 3.5–5.1)

## 2010-11-06 LAB — CBC
HCT: 31.3 % — ABNORMAL LOW (ref 36.0–46.0)
Hemoglobin: 10.4 g/dL — ABNORMAL LOW (ref 12.0–15.0)
MCHC: 33.2 g/dL (ref 30.0–36.0)
WBC: 8.3 10*3/uL (ref 4.0–10.5)

## 2010-11-06 MED ORDER — SODIUM CHLORIDE 0.9 % IV SOLN: 999.0000 mL | INTRAVENOUS | Status: DC

## 2010-11-06 NOTE — Progress Notes (Signed)
Subjective: Feels a little better today. Feels rectal bleeding is slowly starting to improve. Thinks that she will be ready to go home tomorrow. She is willing to try the canasa suppositories that were suggested by Dr. [name] yesterday  Objective: Vital signs in last 24 hours: Temp:  [98.1 F (36.7 C)-98.4 F (36.9 C)] 98.1 F (36.7 C) (08/16 0600) Pulse Rate:  [63-78] 63  (08/16 0600) Resp:  [18-21] 18  (08/16 0600) BP: (112-128)/(59-71) 128/71 mmHg (08/16 0600) SpO2:  [94 %-97 %] 95 % (08/16 0600) Weight change:  Last BM Date: 11/05/10  Intake/Output from previous day: 08/15 0701 - 08/16 0700 In: 2295 [P.O.:1095; I.V.:1200] Out: -    Physical Exam: General: Alert, awake, oriented x3, in no acute distress. HEENT: No bruits, no goiter. Heart: Regular rate and rhythm, without murmurs, rubs, gallops. Lungs: Clear to auscultation bilaterally. Abdomen: Soft, tender to palpation left greater than right lower quadrant, nondistended, positive bowel sounds. Extremities: No clubbing cyanosis or edema with positive pedal pulses. Neuro: Grossly intact, nonfocal.    Lab Results:  Basename 11/06/10 0534 11/05/10 0548  WBC 8.3 4.5  HGB 10.4* 12.3  HCT 31.3* 36.8  PLT 292 337    Basename 11/06/10 0534 11/05/10 0548  NA 138 137  K 3.9 4.2  CL 105 104  CO2 23 21  GLUCOSE 175* 168*  BUN 11 8  CREATININE 0.85 0.67  CALCIUM 8.5 9.0    Studies/Results: Dg Abd Acute W/chest  11/04/2010  *RADIOLOGY REPORT*  Clinical Data: Nominal pain, rectal bleeding, history of ulcerative colitis, question free air  ACUTE ABDOMEN SERIES (ABDOMEN 2 VIEW & CHEST 1 VIEW)  Comparison: Chest radiograph 03/15/2008, abdominal radiographs 06/28/2006  Findings: Upper-normal size of cardiac silhouette. Mediastinal contours and pulmonary vascularity normal. Lungs appear mildly emphysematous but clear. No pleural effusion or pneumothorax. Mild osseous demineralization. Surgical clips right upper quadrant  question cholecystectomy. Gas identified in nondistended colon with probable wall thickening of sigmoid, descending and distal transverse colon, which could be seen with ulcerative colitis. Small bowel loops right mid abdomen are nondistended and show no wall thickening. No urinary tract calcification. Single phlebolith and surgical clip in the right pelvis. No free intraperitoneal air or evidence of bowel obstruction.  IMPRESSION: Bowel wall thickening involving distal transverse through sigmoid colon, which could be seen with ulcerative colitis or colitis of any etiology. No definite evidence of bowel perforation or obstruction.  Original Report Authenticated By: Burnetta Sabin, M.D.    Medications: Scheduled Meds:   . benazepril  20 mg Oral Daily   And  . hydrochlorothiazide  12.5 mg Oral Daily  . cholecalciferol  1,000 Units Oral Daily  . escitalopram  20 mg Oral QHS  . methylPREDNISolone sodium succinate  30 mg Intravenous Q12H  . omega-3 acid ethyl esters  1 capsule Oral Daily  . pantoprazole (PROTONIX) IV  40 mg Intravenous Q24H  . Petrolatum-Zinc Oxide  1 application Topical BID  . saccharomyces boulardii  250 mg Oral BID  . sodium chloride      . DISCONTD: methylPREDNISolone sodium succinate  125 mg Intravenous Q8H   Continuous Infusions:   . sodium chloride 999 mL (11/06/10 0600)   PRN Meds:.acetaminophen, ALPRAZolam, HYDROcodone-acetaminophen, HYDROmorphone, ondansetron (ZOFRAN) IV, ondansetron, zolpidem  Assessment/Plan:  1.  ULCERATIVE COLITIS, LEFT SIDED: On decreased dose of steroids, is clinically improving. To discuss start of canasa suppositories with GI. 2.  HYPERTENSION: Well controlled. 3.  Depression    LOS: 2 days   HERNANDEZ  ACOSTA,ESTELA 11/06/2010, 9:42 AM

## 2010-11-06 NOTE — Progress Notes (Signed)
Please note iv rate changed to 50 ml/hr. Only 2 BMs today; She will pick up samples of canasa supp. from the office in am  to be used qhs. remicade infusion next week as soon as cleared by her dentist.

## 2010-11-06 NOTE — Progress Notes (Signed)
  [name] was admitted today with rectal bleeding and chronic UC which has been refractory to Mesalamine. She says she feels somewhat better this am. She is passing a small amt of blood per rectum. Her stools are loose. She also c/o left jaw pain/swelling this am. She recently underwent 3 root canals.  She c/o her lower teeth hurting.  She is receiving pain medications about every 4 hrs. She rates her pain without pain medication 4/10 on pain scale. O: 98.1-63-18-128/71- O2 Sat 95%. Alert and oriented. Skin warm and dry. Lung clear. Heart RRR. Abdomen is soft. Bowel sounds are positive. No masses felt. Tenderness lower abdomen.  Hemoglobin down to 10.4 and hematocrit 31.3 A: Chronic UC which is refractory to Mesalamine P: She is to be scheduled next week for Remicade. We did discuss Canasa supp. and she is interested in trying.  Dr. [name] will be in later to talk with patient.

## 2010-11-07 LAB — CBC
Hemoglobin: 10.6 g/dL — ABNORMAL LOW (ref 12.0–15.0)
MCH: 30 pg (ref 26.0–34.0)
MCV: 92.4 fL (ref 78.0–100.0)
RBC: 3.53 MIL/uL — ABNORMAL LOW (ref 3.87–5.11)

## 2010-11-07 NOTE — Discharge Summary (Signed)
Physician Discharge Summary  Patient ID: [name] MRN: [id number] DOB/AGE: 69/69/1942 69 y.o.  Admit date: 11/04/2010 Discharge date: 11/07/2010  Primary Care Physician:  Elwin Mocha, [place]   Discharge Diagnoses:    Patient Active Problem List  Diagnoses  . HYPERTENSION  . ULCERATIVE COLITIS, LEFT SIDED  . Fibromyalgia  . Depression    Current Discharge Medication List    CONTINUE these medications which have NOT CHANGED   Details  acetaminophen (TYLENOL) 500 MG tablet Take 500 mg by mouth every 6 (six) hours as needed. FOR PAIN    ALPRAZolam (XANAX) 0.5 MG tablet Take 0.5 mg by mouth 2 (two) times daily as needed. FOR ANXIETY     antiseptic oral rinse (BIOTENE) LIQD 1 application by Mouth Rinse route as needed. FOR DRY MOUTH     B Complex CAPS Take 1 capsule by mouth daily.      benazepril-hydrochlorthiazide (LOTENSIN HCT) 20-12.5 MG per tablet Take 1 tablet by mouth daily.      Cholecalciferol (VITAMIN D3) 1000 UNITS CAPS Take 1 capsule by mouth daily.      co-enzyme Q-10 50 MG capsule Take 50 mg by mouth daily.      escitalopram (LEXAPRO) 20 MG tablet Take 20 mg by mouth at bedtime.      HYDROcodone-acetaminophen (VICODIN) 5-500 MG per tablet Take 1 tablet by mouth every 6 (six) hours as needed.      Multiple Vitamins-Minerals (WOMENS MULTI VITAMIN & MINERAL PO) Take 1 tablet by mouth daily.      Omega-3 Fatty Acids (FISH OIL) 1000 MG CPDR Take 1 capsule by mouth daily.      Petrolatum-Zinc Oxide (SENSI-CARE PROTECTIVE BARRIER EX) Apply 1 application topically daily.      predniSONE (DELTASONE) 10 MG tablet Take 15 mg by mouth as directed. TAKE ONE AND ONE HALF TABLET ONCE DAILY    Probiotic Product (PROBIOTIC FORMULA PO) Take 1-2 capsules by mouth daily.      traMADol (ULTRAM) 50 MG tablet Take 50 mg by mouth 2 (two) times daily as needed.      vitamin A 10000 UNIT capsule Take 10,000 Units by mouth daily.      vitamin C (ASCORBIC ACID)  500 MG tablet Take 500 mg by mouth daily.      vitamin E 400 UNIT capsule Take 400 Units by mouth daily.      zolpidem (AMBIEN) 10 MG tablet Take 10 mg by mouth at bedtime as needed. FOR SLEEP       STOP taking these medications     citalopram (CELEXA) 40 MG tablet      mesalamine (LIALDA) 1.2 G EC tablet      saccharomyces boulardii (FLORASTOR) 250 MG capsule      Vitamins A C E-Zn CAPS          Disposition and Follow-up: She will need to followup with Dr. Melony Overly for Remicade infusion after she has been cleared by her dentist.  Consults:  GI Dr. [name]   Significant Diagnostic Studies:  Dg Abd Acute W/chest  11/04/2010  *RADIOLOGY REPORT*  Clinical Data: Nominal pain, rectal bleeding, history of ulcerative colitis, question free air  ACUTE ABDOMEN SERIES (ABDOMEN 2 VIEW & CHEST 1 VIEW)  Comparison: Chest radiograph 03/15/2008, abdominal radiographs 06/28/2006  Findings: Upper-normal size of cardiac silhouette. Mediastinal contours and pulmonary vascularity normal. Lungs appear mildly emphysematous but clear. No pleural effusion or pneumothorax. Mild osseous demineralization. Surgical clips right upper quadrant question cholecystectomy. Gas  identified in nondistended colon with probable wall thickening of sigmoid, descending and distal transverse colon, which could be seen with ulcerative colitis. Small bowel loops right mid abdomen are nondistended and show no wall thickening. No urinary tract calcification. Single phlebolith and surgical clip in the right pelvis. No free intraperitoneal air or evidence of bowel obstruction.  IMPRESSION: Bowel wall thickening involving distal transverse through sigmoid colon, which could be seen with ulcerative colitis or colitis of any etiology. No definite evidence of bowel perforation or obstruction.  Original Report Authenticated By: Burnetta Sabin, M.D.      Hospital Course:   1.  ULCERATIVE COLITIS, LEFT SIDED: Still has some mild bloody bowel  movements. Will start canasa suppositories and has a already  picked up samples from Dr. Olevia Perches office. She will also start Remicade once she has been cleared by her dentist as she recently had 2 root canals performed. Will continue prednisone at 15 mg daily, same as prior to admission. 2.  HYPERTENSION 3.  Depression    Signed: HERNANDEZ ACOSTA,ESTELA 11/07/2010, 11:00 AM

## 2010-11-07 NOTE — Progress Notes (Signed)
Pt discharged with instructions.  I spoke with the receptionists at the Dr. Otelia Limes' office and she stated that the office would call pt for the appointment.  Pt left the floor via w/c with staff in stable condition. No further questions or concerns voiced at this time.

## 2010-11-08 NOTE — Progress Notes (Signed)
Medical screening examination/treatment/procedure(s) were conducted as a shared visit with non-physician practitioner(s) and myself.  I personally evaluated the patient during the encounter

## 2010-11-11 ENCOUNTER — Encounter (INDEPENDENT_AMBULATORY_CARE_PROVIDER_SITE_OTHER): Payer: Self-pay | Admitting: *Deleted

## 2010-11-18 ENCOUNTER — Other Ambulatory Visit (INDEPENDENT_AMBULATORY_CARE_PROVIDER_SITE_OTHER): Payer: Self-pay | Admitting: Internal Medicine

## 2010-11-18 MED ORDER — MESALAMINE 1000 MG RE SUPP: 1000.0000 mg | Freq: Every day | RECTAL | Status: DC

## 2010-11-18 NOTE — Telephone Encounter (Signed)
Will e-prescribe Canasa 1gm supp to [place]

## 2010-12-04 ENCOUNTER — Encounter (INDEPENDENT_AMBULATORY_CARE_PROVIDER_SITE_OTHER): Payer: Self-pay | Admitting: Internal Medicine

## 2010-12-04 ENCOUNTER — Ambulatory Visit (INDEPENDENT_AMBULATORY_CARE_PROVIDER_SITE_OTHER): Payer: Medicare PPO | Admitting: Internal Medicine

## 2010-12-04 DIAGNOSIS — K5190 Ulcerative colitis, unspecified, without complications: Secondary | ICD-10-CM

## 2010-12-04 MED ORDER — MESALAMINE 1.2 G PO TBEC: 2500.0000 mg | DELAYED_RELEASE_TABLET | Freq: Two times a day (BID) | ORAL | Status: DC

## 2010-12-04 MED ORDER — MESALAMINE 1000 MG RE SUPP: 1000.0000 mg | Freq: Every day | RECTAL | Status: DC

## 2010-12-04 NOTE — Patient Instructions (Signed)
Continue casana suppository one daily per rectum at bedtime. Resume lialda  2 tablets twice daily.

## 2010-12-04 NOTE — Progress Notes (Signed)
Presenting complaint; followup for ulcerative colitis. Subjective; [name] is 70 year old Caucasian female who has refractory distal ulcerative colitis. She was in the hospital last month and discharged on 11/07/2010. She has been tapering her prednisone slowly last dose was 2 weeks ago. Remicade infusion was postponed because she developed a sinusitis and she had a tic bite. She has been able to hold Canasa suppositories anywhere from 45 minutes to 6 hours. She states she is 50% better. She is having less bleeding less, also passing small amount of blood with her bowel movements. She occasionally has formed stools she is still making a trochanter to the bathroom  every day but stool volume is always small. Her appetite is fair and she has not lost any more weight. Current Outpatient Prescriptions on File Prior to Visit  Medication Sig Dispense Refill  . acetaminophen (TYLENOL) 500 MG tablet Take 500 mg by mouth every 6 (six) hours as needed. FOR PAIN      . ALPRAZolam (XANAX) 0.5 MG tablet Take 0.5 mg by mouth 2 (two) times daily as needed. FOR ANXIETY       . antiseptic oral rinse (BIOTENE) LIQD 1 application by Mouth Rinse route as needed. FOR DRY MOUTH       . B Complex CAPS Take 1 capsule by mouth daily.        . benazepril-hydrochlorthiazide (LOTENSIN HCT) 20-12.5 MG per tablet Take 1 tablet by mouth daily.        . Cholecalciferol (VITAMIN D3) 1000 UNITS CAPS Take 1 capsule by mouth daily.        Marland Kitchen co-enzyme Q-10 50 MG capsule Take 50 mg by mouth daily.        Marland Kitchen escitalopram (LEXAPRO) 20 MG tablet Take 20 mg by mouth at bedtime.        Marland Kitchen HYDROcodone-acetaminophen (VICODIN) 5-500 MG per tablet Take 1 tablet by mouth every 6 (six) hours as needed.        . Multiple Vitamins-Minerals (WOMENS MULTI VITAMIN & MINERAL PO) Take 1 tablet by mouth daily.        . Omega-3 Fatty Acids (FISH OIL) 1000 MG CPDR Take 1 capsule by mouth daily.        Marland Kitchen Petrolatum-Zinc Oxide (SENSI-CARE PROTECTIVE BARRIER EX)  Apply 1 application topically daily.        . Probiotic Product (PROBIOTIC FORMULA PO) Take 1-2 capsules by mouth daily.        . traMADol (ULTRAM) 50 MG tablet Take 50 mg by mouth 2 (two) times daily as needed.        . vitamin A 10000 UNIT capsule Take 10,000 Units by mouth daily.        . vitamin C (ASCORBIC ACID) 500 MG tablet Take 500 mg by mouth daily.        . vitamin E 400 UNIT capsule Take 400 Units by mouth daily.        Marland Kitchen zolpidem (AMBIEN) 10 MG tablet Take 10 mg by mouth at bedtime as needed. FOR SLEEP       . DISCONTD: mesalamine (CANASA) 1000 MG suppository Place 1 suppository (1,000 mg total) rectally at bedtime.  30 suppository  6  . predniSONE (DELTASONE) 10 MG tablet Take 15 mg by mouth as directed. TAKE ONE AND ONE HALF TABLET ONCE DAILY       objective; BP 110/68  Pulse 72  Temp(Src) 97 F (36.1 C) (Oral)  Ht 5' 8"  (1.727 m)  Wt 140 lb (63.504 kg)  BMI 21.29 kg/m2 Conjunctiva is pink. Sclerae nonicteric. Oropharyngeal mucosa is normal. No neck masses or thyromegaly noted. Her abdomen is flat and soft with mild tenderness in LLQ and hypogastric region. No organomegaly or masses noted. No peripheral edema. Nails covered with nail polish. Assessment; #1. Distal ulcerative colitis. She appears to be doing better with Canasa suppositories because rectal disease responding. I believe rectal disease has been the source of most of her symptoms. She needs to go back on Lialda to control disease proximal to the rectum.. #2. Stress disorder. Patient seemed to be doing well with when necessary use of alprazolam to Recommendations; Resume Lialda at 2.4 g twice daily. New prescription given for a month with 11 refills. Continue Canasa suppositories 1 g  per rectum daily at bedtime. If there is progression in her symptoms will proceed with Remicade. Office visit in 3 months.

## 2010-12-12 ENCOUNTER — Telehealth: Payer: Self-pay | Admitting: Gastroenterology

## 2010-12-12 NOTE — Telephone Encounter (Signed)
Transferred [facility].

## 2010-12-16 LAB — BASIC METABOLIC PANEL
BUN: 12
BUN: 12
CO2: 27
Chloride: 106
Chloride: 108
Creatinine, Ser: 0.94
Creatinine, Ser: 1.05
Glucose, Bld: 179 — ABNORMAL HIGH
Glucose, Bld: 96

## 2010-12-16 LAB — EHEC TOXIN BY EIA, STOOL

## 2010-12-16 LAB — URINALYSIS, ROUTINE W REFLEX MICROSCOPIC
Ketones, ur: NEGATIVE
Nitrite: NEGATIVE
Protein, ur: NEGATIVE
Urobilinogen, UA: 0.2
pH: 7

## 2010-12-16 LAB — COMPREHENSIVE METABOLIC PANEL
ALT: 16
Albumin: 3.2 — ABNORMAL LOW
Alkaline Phosphatase: 56
BUN: 6
Chloride: 104
Glucose, Bld: 166 — ABNORMAL HIGH
Potassium: 3.8
Sodium: 136
Total Bilirubin: 0.7

## 2010-12-16 LAB — DIFFERENTIAL
Lymphs Abs: 1.4
Monocytes Relative: 6
Neutro Abs: 4.2
Neutrophils Relative %: 69

## 2010-12-16 LAB — CBC
HCT: 35.3 — ABNORMAL LOW
Hemoglobin: 11.9 — ABNORMAL LOW
Hemoglobin: 12
MCHC: 34.1
MCHC: 34.2
MCV: 89.6
MCV: 90.2
Platelets: 272
Platelets: 278
Platelets: 247
RBC: 3.84 — ABNORMAL LOW
RDW: 14.3
RDW: 13.9
WBC: 5.9
WBC: 6.9
WBC: 7.3

## 2010-12-16 LAB — CLOSTRIDIUM DIFFICILE EIA

## 2010-12-16 LAB — HEMOGLOBIN A1C
Hgb A1c MFr Bld: 6.8 — ABNORMAL HIGH
Mean Plasma Glucose: 165

## 2010-12-16 LAB — STOOL CULTURE

## 2010-12-16 LAB — OVA AND PARASITE EXAMINATION

## 2010-12-22 LAB — CBC
Hemoglobin: 13.7
MCHC: 33.7
MCV: 89.5
RBC: 4.55
RDW: 15.3

## 2010-12-22 LAB — COMPREHENSIVE METABOLIC PANEL
ALT: 13
BUN: 12
CO2: 26
Calcium: 9
Creatinine, Ser: 1.02
GFR calc non Af Amer: 54 — ABNORMAL LOW
Glucose, Bld: 107 — ABNORMAL HIGH
Sodium: 137
Total Protein: 6.3

## 2010-12-22 LAB — URINALYSIS, ROUTINE W REFLEX MICROSCOPIC
Hgb urine dipstick: NEGATIVE
Nitrite: NEGATIVE
Protein, ur: NEGATIVE
Specific Gravity, Urine: 1.02
Urobilinogen, UA: 0.2

## 2010-12-22 LAB — OCCULT BLOOD X 1 CARD TO LAB, STOOL: Fecal Occult Bld: NEGATIVE

## 2010-12-22 LAB — DIFFERENTIAL
Eosinophils Absolute: 0.1
Lymphocytes Relative: 32
Lymphs Abs: 2.4
Monocytes Relative: 5
Neutro Abs: 4.6
Neutrophils Relative %: 61

## 2010-12-22 LAB — URINE MICROSCOPIC-ADD ON

## 2010-12-22 LAB — ABO/RH: ABO/RH(D): A POS

## 2010-12-22 LAB — LIPASE, BLOOD: Lipase: 20

## 2010-12-22 LAB — TYPE AND SCREEN
ABO/RH(D): A POS
Antibody Screen: NEGATIVE

## 2010-12-26 LAB — BASIC METABOLIC PANEL
BUN: 18 mg/dL (ref 6–23)
Creatinine, Ser: 0.98 mg/dL (ref 0.4–1.2)
GFR calc non Af Amer: 57 mL/min — ABNORMAL LOW (ref >60)
Glucose, Bld: 107 mg/dL — ABNORMAL HIGH (ref 70–99)
Potassium: 3.3 mEq/L — ABNORMAL LOW (ref 3.5–5.1)

## 2010-12-26 LAB — DIFFERENTIAL
Eosinophils Relative: 1 % (ref 0–5)
Lymphocytes Relative: 29 % (ref 12–46)
Lymphs Abs: 1.9 10*3/uL (ref 0.7–4.0)
Monocytes Absolute: 0.5 10*3/uL (ref 0.1–1.0)
Neutro Abs: 4 10*3/uL (ref 1.7–7.7)

## 2010-12-26 LAB — POCT CARDIAC MARKERS
CKMB, poc: <1 ng/mL — ABNORMAL LOW (ref 1.0–8.0)
Myoglobin, poc: 74.5 ng/mL (ref 12–200)
Troponin i, poc: <0.05 ng/mL (ref 0.00–0.09)

## 2010-12-26 LAB — CBC
HCT: 41 % (ref 36.0–46.0)
MCV: 92.5 fL (ref 78.0–100.0)
Platelets: 270 10*3/uL (ref 150–400)
RDW: 14.1 % (ref 11.5–15.5)

## 2010-12-29 LAB — COMPREHENSIVE METABOLIC PANEL
Albumin: 3.4 — ABNORMAL LOW
Alkaline Phosphatase: 52
BUN: 13
Calcium: 8.6
Glucose, Bld: 114 — ABNORMAL HIGH
Potassium: 3.8
Sodium: 136
Total Protein: 6

## 2010-12-29 LAB — CBC
Hemoglobin: 12.3
Hemoglobin: 12.5
MCHC: 35
RBC: 3.89
RBC: 4.04
RDW: 15

## 2010-12-29 LAB — DIFFERENTIAL
Basophils Relative: 0
Basophils Relative: 0
Lymphs Abs: 0.8
Lymphs Abs: 1.1
Monocytes Absolute: 0.1
Monocytes Absolute: 0.1
Monocytes Relative: 5
Monocytes Relative: 5
Neutro Abs: 1.3 — ABNORMAL LOW
Neutro Abs: 1.4 — ABNORMAL LOW
Neutrophils Relative %: 59

## 2010-12-29 LAB — LIPID PANEL
HDL: 27 — ABNORMAL LOW
LDL Cholesterol: 112 — ABNORMAL HIGH
Total CHOL/HDL Ratio: 6.1
Triglycerides: 129
VLDL: 26

## 2010-12-29 LAB — BILIRUBIN, DIRECT: Bilirubin, Direct: 0.1

## 2010-12-29 LAB — BASIC METABOLIC PANEL
CO2: 23
Calcium: 8.6
Chloride: 105
GFR calc Af Amer: >60
Sodium: 138

## 2010-12-29 LAB — POCT I-STAT CREATININE: Operator id: 198171

## 2010-12-29 LAB — CK TOTAL AND CKMB (NOT AT ARMC)
CK, MB: 0.3
Relative Index: INVALID
Total CK: 20

## 2010-12-29 LAB — PROTIME-INR
INR: 1
Prothrombin Time: 13.2

## 2010-12-29 LAB — I-STAT 8, (EC8 V) (CONVERTED LAB)
BUN: 15
Chloride: 106
HCT: 39
Hemoglobin: 13.3
Operator id: 198171
Potassium: 3.8
Sodium: 137

## 2010-12-29 LAB — POCT CARDIAC MARKERS
Myoglobin, poc: 78
Operator id: 198171

## 2010-12-29 LAB — CARDIAC PANEL(CRET KIN+CKTOT+MB+TROPI): Relative Index: INVALID

## 2010-12-29 LAB — TROPONIN I: Troponin I: 0.02

## 2011-03-09 ENCOUNTER — Ambulatory Visit (INDEPENDENT_AMBULATORY_CARE_PROVIDER_SITE_OTHER): Payer: Medicare PPO | Admitting: Internal Medicine

## 2011-04-13 ENCOUNTER — Ambulatory Visit (INDEPENDENT_AMBULATORY_CARE_PROVIDER_SITE_OTHER): Payer: Medicare PPO | Admitting: Internal Medicine

## 2011-04-30 ENCOUNTER — Inpatient Hospital Stay (HOSPITAL_COMMUNITY)
Admission: EM | Admit: 2011-04-30 | Discharge: 2011-05-05 | DRG: 386 | Disposition: A | Discharge status: Home / Self Care | Payer: Medicare PPO | Attending: Internal Medicine | Admitting: Internal Medicine

## 2011-04-30 ENCOUNTER — Emergency Department (HOSPITAL_COMMUNITY): Payer: Medicare PPO

## 2011-04-30 ENCOUNTER — Encounter (HOSPITAL_COMMUNITY): Payer: Self-pay | Admitting: *Deleted

## 2011-04-30 DIAGNOSIS — R519 Headache, unspecified: Secondary | ICD-10-CM | POA: Diagnosis present

## 2011-04-30 DIAGNOSIS — IMO0002 Reserved for concepts with insufficient information to code with codable children: Secondary | ICD-10-CM

## 2011-04-30 DIAGNOSIS — F3289 Other specified depressive episodes: Secondary | ICD-10-CM | POA: Diagnosis present

## 2011-04-30 DIAGNOSIS — Z79899 Other long term (current) drug therapy: Secondary | ICD-10-CM

## 2011-04-30 DIAGNOSIS — Z888 Allergy status to other drugs, medicaments and biological substances status: Secondary | ICD-10-CM

## 2011-04-30 DIAGNOSIS — K922 Gastrointestinal hemorrhage, unspecified: Secondary | ICD-10-CM | POA: Diagnosis present

## 2011-04-30 DIAGNOSIS — D62 Acute posthemorrhagic anemia: Secondary | ICD-10-CM | POA: Diagnosis not present

## 2011-04-30 DIAGNOSIS — J019 Acute sinusitis, unspecified: Secondary | ICD-10-CM | POA: Diagnosis present

## 2011-04-30 DIAGNOSIS — E860 Dehydration: Secondary | ICD-10-CM | POA: Diagnosis present

## 2011-04-30 DIAGNOSIS — D500 Iron deficiency anemia secondary to blood loss (chronic): Secondary | ICD-10-CM

## 2011-04-30 DIAGNOSIS — IMO0001 Reserved for inherently not codable concepts without codable children: Secondary | ICD-10-CM | POA: Diagnosis present

## 2011-04-30 DIAGNOSIS — R0789 Other chest pain: Secondary | ICD-10-CM | POA: Diagnosis not present

## 2011-04-30 DIAGNOSIS — E139 Other specified diabetes mellitus without complications: Secondary | ICD-10-CM | POA: Diagnosis not present

## 2011-04-30 DIAGNOSIS — F329 Major depressive disorder, single episode, unspecified: Secondary | ICD-10-CM | POA: Diagnosis present

## 2011-04-30 DIAGNOSIS — I498 Other specified cardiac arrhythmias: Secondary | ICD-10-CM | POA: Diagnosis present

## 2011-04-30 DIAGNOSIS — R739 Hyperglycemia, unspecified: Secondary | ICD-10-CM | POA: Diagnosis not present

## 2011-04-30 DIAGNOSIS — Z91040 Latex allergy status: Secondary | ICD-10-CM

## 2011-04-30 DIAGNOSIS — K5792 Diverticulitis of intestine, part unspecified, without perforation or abscess without bleeding: Secondary | ICD-10-CM

## 2011-04-30 DIAGNOSIS — K5150 Left sided colitis without complications: Principal | ICD-10-CM | POA: Diagnosis present

## 2011-04-30 DIAGNOSIS — F32A Depression, unspecified: Secondary | ICD-10-CM

## 2011-04-30 DIAGNOSIS — R51 Headache: Secondary | ICD-10-CM

## 2011-04-30 DIAGNOSIS — R079 Chest pain, unspecified: Secondary | ICD-10-CM | POA: Diagnosis not present

## 2011-04-30 DIAGNOSIS — M797 Fibromyalgia: Secondary | ICD-10-CM

## 2011-04-30 DIAGNOSIS — K219 Gastro-esophageal reflux disease without esophagitis: Secondary | ICD-10-CM | POA: Diagnosis present

## 2011-04-30 DIAGNOSIS — I10 Essential (primary) hypertension: Secondary | ICD-10-CM | POA: Diagnosis present

## 2011-04-30 DIAGNOSIS — Z880 Allergy status to penicillin: Secondary | ICD-10-CM

## 2011-04-30 DIAGNOSIS — T50905A Adverse effect of unspecified drugs, medicaments and biological substances, initial encounter: Secondary | ICD-10-CM

## 2011-04-30 DIAGNOSIS — R001 Bradycardia, unspecified: Secondary | ICD-10-CM

## 2011-04-30 DIAGNOSIS — J329 Chronic sinusitis, unspecified: Secondary | ICD-10-CM

## 2011-04-30 DIAGNOSIS — T380X5A Adverse effect of glucocorticoids and synthetic analogues, initial encounter: Secondary | ICD-10-CM | POA: Diagnosis not present

## 2011-04-30 HISTORY — DX: Hyperglycemia, unspecified: R73.9

## 2011-04-30 HISTORY — DX: Adverse effect of unspecified drugs, medicaments and biological substances, initial encounter: T50.905A

## 2011-04-30 HISTORY — DX: Iron deficiency anemia secondary to blood loss (chronic): D50.0

## 2011-04-30 LAB — CBC
MCH: 28.9 pg (ref 26.0–34.0)
MCHC: 32.7 g/dL (ref 30.0–36.0)
MCV: 88.1 fL (ref 78.0–100.0)
Platelets: 401 10*3/uL — ABNORMAL HIGH (ref 150–400)
RDW: 14.8 % (ref 11.5–15.5)

## 2011-04-30 LAB — DIFFERENTIAL
Basophils Absolute: 0.1 10*3/uL (ref 0.0–0.1)
Basophils Relative: 1 % (ref 0–1)
Eosinophils Absolute: 0.2 10*3/uL (ref 0.0–0.7)
Eosinophils Relative: 4 % (ref 0–5)

## 2011-04-30 LAB — URINALYSIS, ROUTINE W REFLEX MICROSCOPIC
Hgb urine dipstick: NEGATIVE
Specific Gravity, Urine: 1.01 (ref 1.005–1.030)
Urobilinogen, UA: 0.2 mg/dL (ref 0.0–1.0)
pH: 7 (ref 5.0–8.0)

## 2011-04-30 LAB — COMPREHENSIVE METABOLIC PANEL
ALT: 10 U/L (ref 0–35)
AST: 16 U/L (ref 0–37)
Albumin: 3 g/dL — ABNORMAL LOW (ref 3.5–5.2)
Calcium: 9.4 mg/dL (ref 8.4–10.5)
GFR calc Af Amer: 71 mL/min — ABNORMAL LOW (ref >90)
Glucose, Bld: 126 mg/dL — ABNORMAL HIGH (ref 70–99)
Sodium: 137 mEq/L (ref 135–145)
Total Protein: 6.5 g/dL (ref 6.0–8.3)

## 2011-04-30 LAB — URINE MICROSCOPIC-ADD ON

## 2011-04-30 LAB — LACTIC ACID, PLASMA: Lactic Acid, Venous: 1.5 mmol/L (ref 0.5–2.2)

## 2011-04-30 MED ORDER — HYDROCHLOROTHIAZIDE 12.5 MG PO CAPS
12.5000 mg | ORAL_CAPSULE | Freq: Every day | ORAL | Status: DC
  Administered 2011-05-01 – 2011-05-04 (×4): 12.5 mg via ORAL
  Filled 2011-04-30 (×4): qty 1

## 2011-04-30 MED ORDER — VITAMIN A 10000 UNITS PO CAPS
10000.0000 [IU] | ORAL_CAPSULE | Freq: Every day | ORAL | Status: DC
  Filled 2011-04-30: qty 1

## 2011-04-30 MED ORDER — ESCITALOPRAM OXALATE 10 MG PO TABS
20.0000 mg | ORAL_TABLET | Freq: Every day | ORAL | Status: DC
  Administered 2011-04-30 – 2011-05-05 (×5): 20 mg via ORAL
  Filled 2011-04-30 (×5): qty 2

## 2011-04-30 MED ORDER — METRONIDAZOLE IN NACL 5-0.79 MG/ML-% IV SOLN
500.0000 mg | Freq: Once | INTRAVENOUS | Status: AC
  Administered 2011-04-30: 500 mg via INTRAVENOUS
  Filled 2011-04-30: qty 100

## 2011-04-30 MED ORDER — METOCLOPRAMIDE HCL 5 MG/ML IJ SOLN
10.0000 mg | Freq: Once | INTRAMUSCULAR | Status: AC
  Administered 2011-04-30: 10 mg via INTRAVENOUS
  Filled 2011-04-30: qty 2

## 2011-04-30 MED ORDER — SODIUM CHLORIDE 0.9 % IJ SOLN
INTRAMUSCULAR | Status: AC
  Administered 2011-04-30: 3 mL
  Filled 2011-04-30: qty 3

## 2011-04-30 MED ORDER — METHYLPREDNISOLONE SODIUM SUCC 125 MG IJ SOLR
60.0000 mg | Freq: Four times a day (QID) | INTRAMUSCULAR | Status: DC
  Administered 2011-04-30 – 2011-05-01 (×2): 60 mg via INTRAVENOUS
  Filled 2011-04-30 (×2): qty 2

## 2011-04-30 MED ORDER — DIPHENHYDRAMINE HCL 50 MG/ML IJ SOLN
25.0000 mg | Freq: Once | INTRAMUSCULAR | Status: AC
  Administered 2011-04-30: 18:00:00 via INTRAVENOUS
  Filled 2011-04-30: qty 1

## 2011-04-30 MED ORDER — ONDANSETRON HCL 4 MG/2ML IJ SOLN
4.0000 mg | Freq: Once | INTRAMUSCULAR | Status: AC
  Administered 2011-04-30: 4 mg via INTRAVENOUS

## 2011-04-30 MED ORDER — CIPROFLOXACIN IN D5W 400 MG/200ML IV SOLN
400.0000 mg | Freq: Once | INTRAVENOUS | Status: AC
  Administered 2011-04-30: 400 mg via INTRAVENOUS
  Filled 2011-04-30: qty 200

## 2011-04-30 MED ORDER — CIPROFLOXACIN IN D5W 400 MG/200ML IV SOLN
400.0000 mg | Freq: Two times a day (BID) | INTRAVENOUS | Status: DC
  Administered 2011-05-01 – 2011-05-02 (×3): 400 mg via INTRAVENOUS
  Filled 2011-04-30 (×7): qty 200

## 2011-04-30 MED ORDER — METRONIDAZOLE IN NACL 5-0.79 MG/ML-% IV SOLN
INTRAVENOUS | Status: AC
  Filled 2011-04-30: qty 100

## 2011-04-30 MED ORDER — BENAZEPRIL HCL 10 MG PO TABS
20.0000 mg | ORAL_TABLET | Freq: Every day | ORAL | Status: DC
  Administered 2011-05-01 – 2011-05-05 (×5): 20 mg via ORAL
  Filled 2011-04-30 (×5): qty 2

## 2011-04-30 MED ORDER — VITAMIN E 180 MG (400 UNIT) PO CAPS
400.0000 [IU] | ORAL_CAPSULE | Freq: Every day | ORAL | Status: DC
  Administered 2011-05-01: 400 [IU] via ORAL
  Filled 2011-04-30 (×4): qty 1

## 2011-04-30 MED ORDER — BENAZEPRIL-HYDROCHLOROTHIAZIDE 20-12.5 MG PO TABS: 1.0000 | ORAL_TABLET | Freq: Every day | ORAL | Status: DC

## 2011-04-30 MED ORDER — ONDANSETRON HCL 4 MG/2ML IJ SOLN: 4.0000 mg | Freq: Three times a day (TID) | INTRAMUSCULAR | Status: DC | PRN

## 2011-04-30 MED ORDER — HYDROMORPHONE HCL PF 1 MG/ML IJ SOLN
1.0000 mg | Freq: Once | INTRAMUSCULAR | Status: AC
  Administered 2011-04-30: 1 mg via INTRAVENOUS
  Filled 2011-04-30: qty 1

## 2011-04-30 MED ORDER — CIPROFLOXACIN IN D5W 400 MG/200ML IV SOLN
INTRAVENOUS | Status: AC
  Filled 2011-04-30: qty 200

## 2011-04-30 MED ORDER — B COMPLEX-C PO TABS
1.0000 | ORAL_TABLET | Freq: Every day | ORAL | Status: DC
  Administered 2011-05-01 – 2011-05-04 (×4): 1 via ORAL
  Filled 2011-04-30 (×6): qty 1

## 2011-04-30 MED ORDER — VITAMIN C 500 MG PO TABS
500.0000 mg | ORAL_TABLET | Freq: Every day | ORAL | Status: DC
  Administered 2011-05-01 – 2011-05-04 (×4): 500 mg via ORAL
  Filled 2011-04-30 (×5): qty 1

## 2011-04-30 MED ORDER — PANTOPRAZOLE SODIUM 40 MG IV SOLR
40.0000 mg | INTRAVENOUS | Status: DC
  Administered 2011-04-30 – 2011-05-01 (×2): 40 mg via INTRAVENOUS
  Filled 2011-04-30 (×2): qty 40

## 2011-04-30 MED ORDER — METRONIDAZOLE IN NACL 5-0.79 MG/ML-% IV SOLN
500.0000 mg | Freq: Three times a day (TID) | INTRAVENOUS | Status: DC
  Administered 2011-05-01 – 2011-05-02 (×4): 500 mg via INTRAVENOUS
  Filled 2011-04-30 (×11): qty 100

## 2011-04-30 MED ORDER — HYDROCODONE-ACETAMINOPHEN 5-325 MG PO TABS
1.0000 | ORAL_TABLET | ORAL | Status: DC | PRN
  Administered 2011-04-30 – 2011-05-04 (×10): 1 via ORAL
  Filled 2011-04-30 (×11): qty 1

## 2011-04-30 MED ORDER — VITAMIN D3 25 MCG (1000 UNIT) PO TABS
1000.0000 [IU] | ORAL_TABLET | Freq: Every day | ORAL | Status: DC
  Administered 2011-05-01 – 2011-05-04 (×4): 1000 [IU] via ORAL
  Filled 2011-04-30 (×7): qty 1

## 2011-04-30 MED ORDER — HYDROMORPHONE HCL PF 1 MG/ML IJ SOLN
1.0000 mg | INTRAMUSCULAR | Status: DC | PRN
  Administered 2011-05-01 (×2): 1 mg via INTRAVENOUS
  Filled 2011-04-30 (×2): qty 1

## 2011-04-30 MED ORDER — BIOTENE DRY MOUTH MT LIQD: 1.0000 "application " | OROMUCOSAL | Status: DC | PRN

## 2011-04-30 MED ORDER — SODIUM CHLORIDE 0.9 % IV SOLN: INTRAVENOUS | Status: DC

## 2011-04-30 MED ORDER — CO-ENZYME Q-10 50 MG PO CAPS: 50.0000 mg | ORAL_CAPSULE | Freq: Every day | ORAL | Status: DC

## 2011-04-30 MED ORDER — FLORA-Q PO CAPS
1.0000 | ORAL_CAPSULE | Freq: Every day | ORAL | Status: DC
  Administered 2011-05-01 – 2011-05-05 (×5): 1 via ORAL
  Filled 2011-04-30 (×5): qty 1

## 2011-04-30 MED ORDER — ENOXAPARIN SODIUM 40 MG/0.4ML ~~LOC~~ SOLN
40.0000 mg | SUBCUTANEOUS | Status: DC
  Administered 2011-04-30: 40 mg via SUBCUTANEOUS
  Filled 2011-04-30: qty 0.4

## 2011-04-30 MED ORDER — ZOLPIDEM TARTRATE 5 MG PO TABS
5.0000 mg | ORAL_TABLET | Freq: Every evening | ORAL | Status: DC | PRN
  Administered 2011-05-05: 5 mg via ORAL
  Filled 2011-04-30: qty 1

## 2011-04-30 MED ORDER — ALPRAZOLAM 0.5 MG PO TABS
0.5000 mg | ORAL_TABLET | Freq: Two times a day (BID) | ORAL | Status: DC | PRN
Start: 2011-04-30 — End: 2011-05-05
  Administered 2011-05-01 – 2011-05-04 (×3): 0.5 mg via ORAL
  Filled 2011-04-30 (×3): qty 1

## 2011-04-30 MED ORDER — IOHEXOL 300 MG/ML  SOLN
100.0000 mL | Freq: Once | INTRAMUSCULAR | Status: AC | PRN
  Administered 2011-04-30: 100 mL via INTRAVENOUS

## 2011-04-30 MED ORDER — HYDROMORPHONE HCL PF 1 MG/ML IJ SOLN: 1.0000 mg | INTRAMUSCULAR | Status: DC | PRN

## 2011-04-30 MED ORDER — SODIUM CHLORIDE 0.9 % IV SOLN
INTRAVENOUS | Status: DC
  Administered 2011-04-30 – 2011-05-02 (×3): via INTRAVENOUS

## 2011-04-30 MED ORDER — ACETAMINOPHEN 500 MG PO TABS
500.0000 mg | ORAL_TABLET | Freq: Four times a day (QID) | ORAL | Status: DC | PRN
  Administered 2011-05-01 – 2011-05-02 (×2): 500 mg via ORAL
  Filled 2011-04-30 (×2): qty 1

## 2011-04-30 MED ORDER — AZITHROMYCIN 250 MG PO TABS
500.0000 mg | ORAL_TABLET | Freq: Once | ORAL | Status: AC
  Administered 2011-04-30: 500 mg via ORAL
  Filled 2011-04-30: qty 2

## 2011-04-30 MED ORDER — ONDANSETRON HCL 4 MG/2ML IJ SOLN
INTRAMUSCULAR | Status: AC
  Administered 2011-04-30: 4 mg via INTRAVENOUS
  Filled 2011-04-30: qty 2

## 2011-04-30 MED ORDER — OMEGA-3-ACID ETHYL ESTERS 1 G PO CAPS
2.0000 g | ORAL_CAPSULE | Freq: Two times a day (BID) | ORAL | Status: DC
  Administered 2011-04-30 – 2011-05-01 (×3): 2 g via ORAL
  Filled 2011-04-30: qty 1
  Filled 2011-04-30 (×3): qty 2
  Filled 2011-04-30: qty 1

## 2011-04-30 MED ORDER — SODIUM CHLORIDE 0.9 % IV BOLUS (SEPSIS)
1000.0000 mL | Freq: Once | INTRAVENOUS | Status: AC
  Administered 2011-04-30: 1000 mL via INTRAVENOUS

## 2011-04-30 MED ORDER — ONDANSETRON HCL 4 MG/2ML IJ SOLN
4.0000 mg | Freq: Once | INTRAMUSCULAR | Status: AC
  Administered 2011-04-30: 4 mg via INTRAVENOUS
  Filled 2011-04-30: qty 2

## 2011-04-30 MED ORDER — ONDANSETRON HCL 4 MG PO TABS
4.0000 mg | ORAL_TABLET | Freq: Three times a day (TID) | ORAL | Status: DC | PRN
Start: 2011-04-30 — End: 2011-05-05
  Administered 2011-05-01 – 2011-05-03 (×3): 4 mg via ORAL
  Filled 2011-04-30 (×3): qty 1

## 2011-04-30 NOTE — H&P (Signed)
PCP:  Eulis Foster, OTR, OTR Chief Complaint:  Abdominal pain and diarrhea of 6 days duration and headache of 3 days' duration  HPI:  Patient is a 71 year old Caucasian female with history of ulcerative colitis diagnosed  6 years ago and also hypertension presenting to the emergency room with 6 days history of generalized abdominal pain with blood diarrhea. Abdominal pain is described as colicky about 0/86 intensity, located in the left lower quadrant associated with multiple episode of bloody diarrhea. Patient was also said to be nauseated and had one to 2 episodes of nonbloody vomiting. She had a subjective feeling of fever, denies any chills or Rigors. She also complain about frontal headaches. Denies any neck stiffness. Patient also denies any history of dysuria or hematuria. Abdominal pain was said to have persisted hence she presented to the emergency room to be evaluated.  Review of Systems:  The patient denies anorexia, fever+, weight loss,, vision loss, decreased hearing, hoarseness, chest pain, syncope, dyspnea on exertion, peripheral edema, balance deficits, hemoptysis, abdominal pain++, melena, hematochezia++, severe indigestion/heartburn, hematuria, incontinence, genital sores, muscle weakness, suspicious skin lesions, transient blindness, difficulty walking, depression, unusual weight change, abnormal bleeding, enlarged lymph nodes, angioedema, and breast masses. Headache+  Past Medical History:  Past Medical History  Diagnosis Date  . Hypertension   . Ulcerative colitis   . Fibromyalgia   . Ulcerative colitis   . Rectal bleeding   . Diarrhea     Past Surgical History  Procedure Date  . Cholecystectomy     s/p  . Colonoscopy 06/16/06. 04/27/2007    proctitis seen, biopsies showed chronic active colitis, no dysplasia                                                                                                                                                                                                                                                                                                                                               .  Abdominal hysterectomy     Medications:  Prior to Admission medications   Medication Sig Start Date End Date Taking? Authorizing Provider  acetaminophen (TYLENOL) 500 MG tablet Take 500 mg by mouth every 6 (six) hours as needed. FOR PAIN   Yes Historical Provider, MD  ALPRAZolam Duanne Moron) 0.5 MG tablet Take 0.5 mg by mouth 2 (two) times daily as needed. FOR ANXIETY    Yes Historical Provider, MD  antiseptic oral rinse (BIOTENE) LIQD 1 application by Mouth Rinse route as needed. FOR DRY MOUTH    Yes Historical Provider, MD  B Complex CAPS Take 1 capsule by mouth daily.     Yes Historical Provider, MD  benazepril-hydrochlorthiazide (LOTENSIN HCT) 20-12.5 MG per tablet Take 1 tablet by mouth daily.     Yes Historical Provider, MD  Cholecalciferol (VITAMIN D3) 1000 UNITS CAPS Take 1 capsule by mouth daily.     Yes Historical Provider, MD  co-enzyme Q-10 50 MG capsule Take 50 mg by mouth daily.     Yes Historical Provider, MD  escitalopram (LEXAPRO) 20 MG tablet Take 20 mg by mouth at bedtime.     Yes Historical Provider, MD  HYDROcodone-acetaminophen (VICODIN) 5-500 MG per tablet Take 1 tablet by mouth every 6 (six) hours as needed. For pain   Yes Historical Provider, MD  Multiple Vitamins-Minerals (WOMENS MULTI VITAMIN & MINERAL PO) Take 1 tablet by mouth daily.     Yes Historical Provider, MD  Omega-3 Fatty Acids (FISH OIL) 1000 MG CPDR Take 1 capsule by mouth daily.     Yes Historical Provider, MD  ondansetron (ZOFRAN) 4 MG tablet Take 4 mg by mouth as needed. For nausea 10/20/10  Yes Historical Provider, MD  Petrolatum-Zinc Oxide (SENSI-CARE PROTECTIVE BARRIER EX) Apply 1 application topically daily.     Yes Historical Provider, MD  Probiotic Product (PROBIOTIC FORMULA PO) Take 1-2 capsules by mouth daily.      Yes Historical Provider, MD  traMADol (ULTRAM) 50 MG tablet Take 50 mg by mouth 2 (two) times daily as needed.     Yes Historical Provider, MD  vitamin A 10000 UNIT capsule Take 10,000 Units by mouth daily.     Yes Historical Provider, MD  vitamin C (ASCORBIC ACID) 500 MG tablet Take 500 mg by mouth daily.     Yes Historical Provider, MD  vitamin E 400 UNIT capsule Take 400 Units by mouth daily.     Yes Historical Provider, MD  zolpidem (AMBIEN) 10 MG tablet Take 10 mg by mouth at bedtime as needed. FOR SLEEP    Yes Historical Provider, MD    Allergies:  Allergies  Allergen Reactions  . Azathioprine Nausea And Vomiting and Other (See Comments)    SEVERE NAUSEA AND VOMITING WITH CHEST PAIN-  PATIENT INSISTS NEVER TO BE GIVEN ANYTHING SIMILAR TO THIS  . Tape Other (See Comments)    BLISTERS AND SKIN TEARING  . Codeine Nausea And Vomiting  . Novocain Other (See Comments)    SEVERE GI UPSET (NAUSEA,VOMITING AND DIARRHEA)  . Penicillins Hives  . Povidone-Iodine Hives    BETADINE  . Sulfonamide Derivatives Hives  . Dairy Aid (Lactase)   . Latex Rash  . Niacin And Related Rash    Social History:   reports that she has never smoked. She does not have any smokeless tobacco history on file. She reports that she drinks alcohol. She reports that she does not use illicit drugs.  Family History:  No family history on file.  Physical Exam:  Filed Vitals:   04/30/11 1518 04/30/11 1525 04/30/11 1758 04/30/11 1800  BP: 168/91 167/82 164/80 164/80  Pulse: 80 92 89   Temp: 98.7 F (37.1 C) 98.6 F (37 C) 98.6 F (37 C)   TempSrc: Oral Oral Oral   Resp: 20 20 20    SpO2: 98% 99% 100%       General: Alert and oriented times three, well developed and nourished, no acute distress, dehydrated  Eyes: PERRLA, pale conjunctiva, scleral iancterus  ENT: Dry oral mucosa, neck supple, no thyromegaly  Lungs: clear to ascultation, no wheeze, no crackles, no use of accessory  muscles  Cardiovascular: regular rate and rhythm, no regurgitation, no gallops, no murmurs. No carotid bruits, no JVD  Abdomen: soft, positive BS, tenderness left lower quadrant with slight guarding, no rigidity, non-distended, no organomegaly.  GU: not examined  Neuro: No lateralizing signs  Musculoskeletal: strength 5/5 all extremities, no clubbing, cyanosis or edema  Skin: Decreased turgor  Psych: appropriate affect  ?  Labs on Admission:   [facility] 04/30/11 1555  NA 137  K 3.6  CL 99  CO2 29  GLUCOSE 126*  BUN 11  CREATININE 0.93  CALCIUM 9.4  MG --  PHOS --     Basename 04/30/11 1555  AST 16  ALT 10  ALKPHOS 75  BILITOT 0.3  PROT 6.5  ALBUMIN 3.0*     Basename 04/30/11 1555  LIPASE 21  AMYLASE --     Basename 04/30/11 1555  WBC 4.9  NEUTROABS 2.8  HGB 11.2*  HCT 34.2*  MCV 88.1  PLT 401*    No results found for this basename: CKTOTAL:3,CKMB:3,CKMBINDEX:3,TROPONINI:3 in the last 72 hours  No results found for this basename: TSH,T4TOTAL,FREET3,T3FREE,THYROIDAB in the last 72 hours  No results found for this basename: VITAMINB12:2,FOLATE:2,FERRITIN:2,TIBC:2,IRON:2,RETICCTPCT:2 in the last 72 hours  Radiological Exams on Admission:  Dg Chest 1 View  04/30/2011  *RADIOLOGY REPORT*  Clinical Data: Cough, nausea  CHEST - 1 VIEW  Comparison: 03/15/2008  Findings: Mild cardiomegaly.  Linear scarring or atelectasis at the left lung base.  Right lung clear.  No effusion.  Regional bones unremarkable.  IMPRESSION:  1.  Stable linear scarring or atelectasis at the left lung base. 2.  Stable mild cardiomegaly.  Original Report Authenticated By: Trecia Rogers, M.D.   Ct Abdomen Pelvis W Contrast  04/30/2011  *RADIOLOGY REPORT*  Clinical Data: 71 year old female with weakness, nausea, fever, abdominal pain.  History of ulcerative colitis.  CT ABDOMEN AND PELVIS WITH CONTRAST  Technique:  Multidetector CT imaging of the abdomen and pelvis was  performed following the standard protocol during bolus administration of intravenous contrast.  Contrast:  100 ml Omnipaque-300.  Comparison: 03/06/2010.  Findings: Minor dependent atelectasis.  No pericardial or pleural effusion.  Osteopenia. No acute osseous abnormality identified.  No pelvic free fluid.  Diffuse colonic wall thickening extends from the splenic flexure distally.  Superimposed diverticulosis of the sigmoid colon is noted with adjacent mesenteric stranding.  No extraluminal gas or fluid in this region.  The transverse colon has a more normal appearance.  There is diverticulosis at the hepatic flexure.  No inflammation of the right colon.  Negative terminal ileum.  No dilated small bowel. Stomach and duodenum within normal limits.  Gallbladder surgically absent.  Stable liver, spleen, pancreas, adrenal glands, and kidneys.  Portal venous system and major arterial structures are patent.  No abdominal free fluid or pneumoperitoneum.  IMPRESSION: 1.  Large bowel wall  thickening throughout the entire left colon and rectosigmoid colon may reflect the history of inflammatory bowel disease.  Evidence of superimposed acute diverticulitis of the sigmoid colon without complicating features. 2.  No other acute findings.  Original Report Authenticated By: Randall An, M.D.    Assessment/Plan  Present on Admission:   Problems: #1 abdominal pain located mainly in the left lower quadrant. #2 bloody diarrhea #3 fever #4 headaches. #5 dehydration #6 anemia #7 thrombocytosis  Impression: #1 ulcerative colitis flareup #2 anemia #3 thrombocytosis #4 dehydration #5 hypertension #6 history of fibromyalgia.  Plan: #1 admit patient to general medical floor. #2 treat for ulcerative colitis flareup with IV Cipro and Flagyl and IV steroids #3 rehydrate patient with normal saline #4 treat pain and headache with IV dilaudid #5 restart home meds #6 GI prophylaxis with IV Protonix and DVT  prophylaxis with Lovenox and TED hose #7 labs; CBC, CMP and magnesium repeated in a.m.            Jackie Plum                  [phone]

## 2011-04-30 NOTE — ED Notes (Signed)
patient presented to the ED with c/o nausea, generalized weakness, fever,HA  Patient alert, oriented Skin warm and oriented.

## 2011-04-30 NOTE — ED Provider Notes (Signed)
History     CSN: [id number]  Arrival date & time 04/30/11  1525   First MD Initiated Contact with Patient 04/30/11 1526      Chief Complaint  Patient presents with  . Abdominal Pain    (Consider location/radiation/quality/duration/timing/severity/associated sxs/prior treatment) Patient is a 71 y.o. female presenting with abdominal pain. The history is provided by the patient. No language interpreter was used.  Abdominal Pain The primary symptoms of the illness include abdominal pain, fatigue and nausea. The primary symptoms of the illness do not include fever, shortness of breath, vomiting, diarrhea or dysuria. The current episode started 2 days ago. The onset of the illness was gradual. The problem has been gradually worsening.  The pain came on gradually. The abdominal pain has been gradually worsening since its onset. The abdominal pain is generalized. The abdominal pain does not radiate. The abdominal pain is relieved by nothing. The abdominal pain is exacerbated by movement.  The fatigue began more than 1 week ago. The fatigue has been worsening since its onset.  Nausea began 2 days ago. The nausea is associated with eating. The nausea is exacerbated by food.  Risk factors for an acute abdominal problem include being elderly. Symptoms associated with the illness do not include chills, anorexia, diaphoresis, constipation, urgency or frequency. Significant associated medical issues include inflammatory bowel disease.    Past Medical History  Diagnosis Date  . Hypertension   . Ulcerative colitis   . Fibromyalgia   . Ulcerative colitis   . Rectal bleeding   . Diarrhea     Past Surgical History  Procedure Date  . Cholecystectomy     s/p  . Colonoscopy 06/16/06. 04/27/2007    proctitis seen, biopsies showed chronic active colitis, no dysplasia                                                                                                                                                                                                                                                                                                                                               .  Abdominal hysterectomy     No family history on file.  History  Substance Use Topics  . Smoking status: Never Smoker   . Smokeless tobacco: Not on file  . Alcohol Use: Yes     Ocassionally a small glass of wine. Patient states that is has been 2 months since she drank anything.    OB History    Grav Para Term Preterm Abortions TAB SAB Ect Mult Living                  Review of Systems  Constitutional: Positive for activity change and fatigue. Negative for fever, chills, diaphoresis and appetite change.  HENT: Positive for ear pain, congestion, sore throat and rhinorrhea. Negative for neck pain and neck stiffness.   Eyes: Negative for photophobia and visual disturbance.  Respiratory: Positive for cough. Negative for shortness of breath.   Gastrointestinal: Positive for nausea and abdominal pain. Negative for vomiting, diarrhea, constipation and anorexia.  Genitourinary: Negative for dysuria, urgency, frequency and flank pain.  Neurological: Positive for weakness and headaches. Negative for dizziness, light-headedness and numbness.  All other systems reviewed and are negative.    Allergies  Azathioprine; Tape; Codeine; Novocain; Penicillins; Povidone-iodine; Sulfonamide derivatives; Dairy aid; Latex; and Niacin and related  Home Medications   Current Outpatient Rx  Name Route Sig Dispense Refill  . ACETAMINOPHEN 500 MG PO TABS Oral Take 500 mg by mouth every 6 (six) hours as needed. FOR PAIN    . ALPRAZOLAM 0.5 MG PO TABS Oral Take 0.5 mg by mouth 2 (two) times daily as needed. FOR ANXIETY     . BIOTENE DRY MOUTH MT LIQD Mouth Rinse 1 application by Mouth Rinse route as needed. FOR DRY MOUTH     . B COMPLEX PO CAPS Oral Take 1 capsule by mouth daily.      Marland Kitchen  BENAZEPRIL-HYDROCHLOROTHIAZIDE 20-12.5 MG PO TABS Oral Take 1 tablet by mouth daily.      Marland Kitchen VITAMIN D3 1000 UNITS PO CAPS Oral Take 1 capsule by mouth daily.      Marland Kitchen CO-ENZYME Q-10 50 MG PO CAPS Oral Take 50 mg by mouth daily.      Marland Kitchen ESCITALOPRAM OXALATE 20 MG PO TABS Oral Take 20 mg by mouth at bedtime.      Marland Kitchen HYDROCODONE-ACETAMINOPHEN 5-500 MG PO TABS Oral Take 1 tablet by mouth every 6 (six) hours as needed. For pain    . WOMENS MULTI VITAMIN & MINERAL PO Oral Take 1 tablet by mouth daily.      Marland Kitchen FISH OIL 1000 MG PO CPDR Oral Take 1 capsule by mouth daily.      Marland Kitchen ONDANSETRON HCL 4 MG PO TABS Oral Take 4 mg by mouth as needed. For nausea    . SENSI-CARE PROTECTIVE BARRIER EX Apply externally Apply 1 application topically daily.      Marland Kitchen PROBIOTIC FORMULA PO Oral Take 1-2 capsules by mouth daily.      . TRAMADOL HCL 50 MG PO TABS Oral Take 50 mg by mouth 2 (two) times daily as needed.      Marland Kitchen VITAMIN A 56389 UNITS PO CAPS Oral Take 10,000 Units by mouth daily.      Marland Kitchen VITAMIN C 500 MG PO TABS Oral Take 500 mg by mouth daily.      Marland Kitchen VITAMIN E 400 UNITS PO CAPS Oral Take 400 Units by mouth daily.      Marland Kitchen ZOLPIDEM TARTRATE 10 MG PO TABS Oral Take 10  mg by mouth at bedtime as needed. FOR SLEEP       BP 164/80  Pulse 89  Temp(Src) 98.6 F (37 C) (Oral)  Resp 20  SpO2 100%  Physical Exam  Nursing note and vitals reviewed. Constitutional: She is oriented to person, place, and time. She appears well-developed and well-nourished.  HENT:  Head: Normocephalic and atraumatic.  Right Ear: External ear normal.  Left Ear: External ear normal.  Mouth/Throat: Oropharynx is clear and moist.  Eyes: Conjunctivae and EOM are normal. Pupils are equal, round, and reactive to light.  Neck: Normal range of motion. Neck supple.  Cardiovascular: Normal rate, regular rhythm, normal heart sounds and intact distal pulses.  Exam reveals no gallop and no friction rub.   No murmur heard. Pulmonary/Chest: Effort normal  and breath sounds normal. No respiratory distress.  Abdominal: Soft. Bowel sounds are normal. There is tenderness (diffuse abdominal tenderness). There is no rebound and no guarding.  Musculoskeletal: Normal range of motion. She exhibits no tenderness.  Neurological: She is alert and oriented to person, place, and time. No cranial nerve deficit.  Skin: Skin is warm and dry. No rash noted.    ED Course  Procedures (including critical care time)  Labs Reviewed  CBC - Abnormal; Notable for the following:    Hemoglobin 11.2 (*)    HCT 34.2 (*)    Platelets 401 (*)    All other components within normal limits  COMPREHENSIVE METABOLIC PANEL - Abnormal; Notable for the following:    Glucose, Bld 126 (*)    Albumin 3.0 (*)    GFR calc non Af Amer 61 (*)    GFR calc Af Amer 71 (*)    All other components within normal limits  URINALYSIS, ROUTINE W REFLEX MICROSCOPIC - Abnormal; Notable for the following:    Leukocytes, UA SMALL (*)    All other components within normal limits  URINE MICROSCOPIC-ADD ON - Abnormal; Notable for the following:    Squamous Epithelial / LPF FEW (*)    All other components within normal limits  DIFFERENTIAL  LIPASE, BLOOD  LACTIC ACID, PLASMA   Dg Chest 1 View  04/30/2011  *RADIOLOGY REPORT*  Clinical Data: Cough, nausea  CHEST - 1 VIEW  Comparison: 03/15/2008  Findings: Mild cardiomegaly.  Linear scarring or atelectasis at the left lung base.  Right lung clear.  No effusion.  Regional bones unremarkable.  IMPRESSION:  1.  Stable linear scarring or atelectasis at the left lung base. 2.  Stable mild cardiomegaly.  Original Report Authenticated By: Trecia Rogers, M.D.   Ct Abdomen Pelvis W Contrast  04/30/2011  *RADIOLOGY REPORT*  Clinical Data: 71 year old female with weakness, nausea, fever, abdominal pain.  History of ulcerative colitis.  CT ABDOMEN AND PELVIS WITH CONTRAST  Technique:  Multidetector CT imaging of the abdomen and pelvis was performed  following the standard protocol during bolus administration of intravenous contrast.  Contrast:  100 ml Omnipaque-300.  Comparison: 03/06/2010.  Findings: Minor dependent atelectasis.  No pericardial or pleural effusion.  Osteopenia. No acute osseous abnormality identified.  No pelvic free fluid.  Diffuse colonic wall thickening extends from the splenic flexure distally.  Superimposed diverticulosis of the sigmoid colon is noted with adjacent mesenteric stranding.  No extraluminal gas or fluid in this region.  The transverse colon has a more normal appearance.  There is diverticulosis at the hepatic flexure.  No inflammation of the right colon.  Negative terminal ileum.  No dilated small bowel.  Stomach and duodenum within normal limits.  Gallbladder surgically absent.  Stable liver, spleen, pancreas, adrenal glands, and kidneys.  Portal venous system and major arterial structures are patent.  No abdominal free fluid or pneumoperitoneum.  IMPRESSION: 1.  Large bowel wall thickening throughout the entire left colon and rectosigmoid colon may reflect the history of inflammatory bowel disease.  Evidence of superimposed acute diverticulitis of the sigmoid colon without complicating features. 2.  No other acute findings.  Original Report Authenticated By: Randall An, M.D.     1. Diverticulitis   2. ULCERATIVE COLITIS, LEFT SIDED   3. Sinusitis       MDM  Acute diverticulitis. Intractable pain and nausea. The patient also has evidence of sinusitis on exam. She'll be treated with Cipro and Flagyl. She received IV fluids and pain medication emergency department. I was unable to adequately control her pain and nausea. Require admission to the hospital for further evaluation and treatment. Discussed the [facility] hospitalist        Trisha Mangle, MD 04/30/11 [phone]

## 2011-05-01 DIAGNOSIS — K5180 Other ulcerative colitis without complications: Secondary | ICD-10-CM

## 2011-05-01 DIAGNOSIS — K5732 Diverticulitis of large intestine without perforation or abscess without bleeding: Secondary | ICD-10-CM

## 2011-05-01 DIAGNOSIS — R1013 Epigastric pain: Secondary | ICD-10-CM

## 2011-05-01 LAB — COMPREHENSIVE METABOLIC PANEL
ALT: 7 U/L (ref 0–35)
AST: 13 U/L (ref 0–37)
Alkaline Phosphatase: 72 U/L (ref 39–117)
CO2: 23 mEq/L (ref 19–32)
Calcium: 8.8 mg/dL (ref 8.4–10.5)
Chloride: 105 mEq/L (ref 96–112)
GFR calc Af Amer: 70 mL/min — ABNORMAL LOW (ref >90)
GFR calc non Af Amer: 61 mL/min — ABNORMAL LOW (ref >90)
Glucose, Bld: 185 mg/dL — ABNORMAL HIGH (ref 70–99)
Potassium: 4 mEq/L (ref 3.5–5.1)
Sodium: 138 mEq/L (ref 135–145)
Total Bilirubin: 0.5 mg/dL (ref 0.3–1.2)

## 2011-05-01 LAB — CBC
MCV: 88.2 fL (ref 78.0–100.0)
Platelets: 366 10*3/uL (ref 150–400)
RBC: 3.82 MIL/uL — ABNORMAL LOW (ref 3.87–5.11)
RDW: 14.9 % (ref 11.5–15.5)
WBC: 8.5 10*3/uL (ref 4.0–10.5)

## 2011-05-01 LAB — DIFFERENTIAL
Basophils Absolute: 0 10*3/uL (ref 0.0–0.1)
Lymphocytes Relative: 7 % — ABNORMAL LOW (ref 12–46)
Lymphs Abs: 0.6 10*3/uL — ABNORMAL LOW (ref 0.7–4.0)
Neutro Abs: 7.9 10*3/uL — ABNORMAL HIGH (ref 1.7–7.7)

## 2011-05-01 MED ORDER — METHYLPREDNISOLONE SODIUM SUCC 125 MG IJ SOLR
60.0000 mg | Freq: Two times a day (BID) | INTRAMUSCULAR | Status: DC
  Administered 2011-05-01 – 2011-05-02 (×2): 60 mg via INTRAVENOUS
  Filled 2011-05-01 (×3): qty 2

## 2011-05-01 MED ORDER — CLONIDINE HCL 0.1 MG PO TABS
0.1000 mg | ORAL_TABLET | Freq: Two times a day (BID) | ORAL | Status: DC
  Administered 2011-05-01 – 2011-05-03 (×4): 0.1 mg via ORAL
  Filled 2011-05-01 (×4): qty 1

## 2011-05-01 NOTE — Progress Notes (Signed)
Patient's Blood pressure was 160/74. Patient was complaining of a severe headache and dizziness. Doctor was notified and new orders were given. Will continue to monitor.

## 2011-05-01 NOTE — Consult Note (Signed)
Reason for Consult  UC Referring Physician: Geovana Gebel is an 71 y.o. female.  HPI:  Cyrilla is a 71 yr old female admitted last night thru the ED. Yesterday she stated she became dizzy, and felt faint. She ate a banana and orange juice.  Her symptoms did not resolved. She chewed 2 Baby ASA.  She called EMS and was transported to the ED.  She also c/o of headache and nausea.   She underwent a CT abdomen and pelvis yesterday for her abdominal pain.  (Please see results below).  Circe has a hx of refractory UC. She tells me that Georgiann Mccoy makes her deathly sick. She says she cannot take Lialda because it upsets her stomach.  She has been off the Lialda for greater than 5 month. She has not taken her  Prednisone since August.  She started acupuncture for her UC. She tells me she has been having 15-20 stools a day for 2-3 months. Stools are  in small amts. She says she did  have 2 formed stools yesterday with a small amout of bright red rectal bleeding.  She has a lot of gas.    She was eating Yogurt and lactose free milk x 2 yrs.  No weight loss. Appetite is good.   She c/o left lower abdominal pain and pain at umbilicus. She also c/o back pain. She saw a Restaurant manager, fast food and xray were taken of her back. She says he made adjustments to her spine.  Her last colonoscopy was in June of 2012: Active disease with transition at 28cm from anal margin. Multiple ulcers involving the rectal mucosa and distal sigmoid colon. Scattered diverticula throughout the colon. Small cecal polyp cold biopsied and residual treated with APC. A 10 mm polyp snared from the rectum.  She tells me she has had a sinus infection for about 4 weeks.  Past Medical History  Diagnosis Date  . Hypertension   . Ulcerative colitis   . Fibromyalgia   . Ulcerative colitis   . Rectal bleeding   . Diarrhea     Past Surgical History  Procedure Date  . Cholecystectomy     s/p  . Colonoscopy 06/16/06. 04/27/2007    proctitis seen,  biopsies showed chronic active colitis, no dysplasia                                                                                                                                                                                                                                                                                                                                              .  Abdominal hysterectomy     History reviewed. No pertinent family history.  Social History:  reports that she has never smoked. She does not have any smokeless tobacco history on file. She reports that she drinks alcohol. She reports that she does not use illicit drugs.  Allergies:  Allergies  Allergen Reactions  . Azathioprine Nausea And Vomiting and Other (See Comments)    SEVERE NAUSEA AND VOMITING WITH CHEST PAIN-  PATIENT INSISTS NEVER TO BE GIVEN ANYTHING SIMILAR TO THIS  . Tape Other (See Comments)    BLISTERS AND SKIN TEARING  . Codeine Nausea And Vomiting  . Novocain Other (See Comments)    SEVERE GI UPSET (NAUSEA,VOMITING AND DIARRHEA)  . Penicillins Hives  . Povidone-Iodine Hives    BETADINE  . Sulfonamide Derivatives Hives  . Dairy Aid (Lactase)   . Latex Rash  . Niacin And Related Rash    Medications: I have reviewed the patient's current medications.  Results for orders placed during the hospital encounter of 04/30/11 (from the past 48 hour(s))  CBC     Status: Abnormal   Collection Time   04/30/11  3:55 PM      Component Value Range Comment   WBC 4.9  4.0 - 10.5 (K/uL)    RBC 3.88  3.87 - 5.11 (MIL/uL)    Hemoglobin 11.2 (*) 12.0 - 15.0 (g/dL)    HCT 34.2 (*) 36.0 - 46.0 (%)    MCV 88.1  78.0 - 100.0 (fL)    MCH 28.9  26.0 - 34.0 (pg)    MCHC 32.7  30.0 - 36.0 (g/dL)    RDW 14.8  11.5 - 15.5 (%)    Platelets 401 (*) 150 - 400 (K/uL)   DIFFERENTIAL     Status: Normal   Collection Time   04/30/11  3:55 PM      Component Value Range Comment   Neutrophils Relative 56  43 - 77 (%)     Neutro Abs 2.8  1.7 - 7.7 (K/uL)    Lymphocytes Relative 32  12 - 46 (%)    Lymphs Abs 1.6  0.7 - 4.0 (K/uL)    Monocytes Relative 8  3 - 12 (%)    Monocytes Absolute 0.4  0.1 - 1.0 (K/uL)    Eosinophils Relative 4  0 - 5 (%)    Eosinophils Absolute 0.2  0.0 - 0.7 (K/uL)    Basophils Relative 1  0 - 1 (%)    Basophils Absolute 0.1  0.0 - 0.1 (K/uL)   COMPREHENSIVE METABOLIC PANEL     Status: Abnormal   Collection Time   04/30/11  3:55 PM      Component Value Range Comment   Sodium 137  135 - 145 (mEq/L)    Potassium 3.6  3.5 - 5.1 (mEq/L)    Chloride 99  96 - 112 (mEq/L)    CO2 29  19 - 32 (mEq/L)    Glucose, Bld 126 (*) 70 - 99 (mg/dL)    BUN 11  6 - 23 (mg/dL)    Creatinine, Ser 0.93  0.50 - 1.10 (mg/dL)    Calcium 9.4  8.4 - 10.5 (mg/dL)    Total Protein 6.5  6.0 - 8.3 (g/dL)    Albumin 3.0 (*) 3.5 - 5.2 (g/dL)    AST 16  0 - 37 (U/L)    ALT 10  0 - 35 (U/L)    Alkaline Phosphatase 75  39 - 117 (U/L)    Total  Bilirubin 0.3  0.3 - 1.2 (mg/dL)    GFR calc non Af Amer 61 (*) >90 (mL/min)    GFR calc Af Amer 71 (*) >90 (mL/min)   LIPASE, BLOOD     Status: Normal   Collection Time   04/30/11  3:55 PM      Component Value Range Comment   Lipase 21  11 - 59 (U/L)   LACTIC ACID, PLASMA     Status: Normal   Collection Time   04/30/11  4:29 PM      Component Value Range Comment   Lactic Acid, Venous 1.5  0.5 - 2.2 (mmol/L)   URINALYSIS, ROUTINE W REFLEX MICROSCOPIC     Status: Abnormal   Collection Time   04/30/11  4:30 PM      Component Value Range Comment   Color, Urine YELLOW  YELLOW     APPearance CLEAR  CLEAR     Specific Gravity, Urine 1.010  1.005 - 1.030     pH 7.0  5.0 - 8.0     Glucose, UA NEGATIVE  NEGATIVE (mg/dL)    Hgb urine dipstick NEGATIVE  NEGATIVE     Bilirubin Urine NEGATIVE  NEGATIVE     Ketones, ur NEGATIVE  NEGATIVE (mg/dL)    Protein, ur NEGATIVE  NEGATIVE (mg/dL)    Urobilinogen, UA 0.2  0.0 - 1.0 (mg/dL)    Nitrite NEGATIVE  NEGATIVE     Leukocytes, UA  SMALL (*) NEGATIVE    URINE MICROSCOPIC-ADD ON     Status: Abnormal   Collection Time   04/30/11  4:30 PM      Component Value Range Comment   Squamous Epithelial / LPF FEW (*) RARE     WBC, UA 3-6  <3 (WBC/hpf)    Bacteria, UA RARE  RARE    CBC     Status: Abnormal   Collection Time   05/01/11  5:29 AM      Component Value Range Comment   WBC 8.5  4.0 - 10.5 (K/uL)    RBC 3.82 (*) 3.87 - 5.11 (MIL/uL)    Hemoglobin 11.2 (*) 12.0 - 15.0 (g/dL)    HCT 33.7 (*) 36.0 - 46.0 (%)    MCV 88.2  78.0 - 100.0 (fL)    MCH 29.3  26.0 - 34.0 (pg)    MCHC 33.2  30.0 - 36.0 (g/dL)    RDW 14.9  11.5 - 15.5 (%)    Platelets 366  150 - 400 (K/uL)   DIFFERENTIAL     Status: Abnormal   Collection Time   05/01/11  5:29 AM      Component Value Range Comment   Neutrophils Relative 93 (*) 43 - 77 (%)    Neutro Abs 7.9 (*) 1.7 - 7.7 (K/uL)    Lymphocytes Relative 7 (*) 12 - 46 (%)    Lymphs Abs 0.6 (*) 0.7 - 4.0 (K/uL)    Monocytes Relative 0 (*) 3 - 12 (%)    Monocytes Absolute 0.0 (*) 0.1 - 1.0 (K/uL)    Eosinophils Relative 0  0 - 5 (%)    Eosinophils Absolute 0.0  0.0 - 0.7 (K/uL)    Basophils Relative 0  0 - 1 (%)    Basophils Absolute 0.0  0.0 - 0.1 (K/uL)   COMPREHENSIVE METABOLIC PANEL     Status: Abnormal   Collection Time   05/01/11  5:29 AM      Component Value Range Comment   Sodium 138  135 - 145 (mEq/L)    Potassium 4.0  3.5 - 5.1 (mEq/L)    Chloride 105  96 - 112 (mEq/L)    CO2 23  19 - 32 (mEq/L)    Glucose, Bld 185 (*) 70 - 99 (mg/dL)    BUN 8  6 - 23 (mg/dL)    Creatinine, Ser 0.94  0.50 - 1.10 (mg/dL)    Calcium 8.8  8.4 - 10.5 (mg/dL)    Total Protein 6.0  6.0 - 8.3 (g/dL)    Albumin 2.8 (*) 3.5 - 5.2 (g/dL)    AST 13  0 - 37 (U/L)    ALT 7  0 - 35 (U/L)    Alkaline Phosphatase 72  39 - 117 (U/L)    Total Bilirubin 0.5  0.3 - 1.2 (mg/dL)    GFR calc non Af Amer 61 (*) >90 (mL/min)    GFR calc Af Amer 70 (*) >90 (mL/min)   MAGNESIUM     Status: Normal   Collection Time    05/01/11  5:29 AM      Component Value Range Comment   Magnesium 1.9  1.5 - 2.5 (mg/dL)     Dg Chest 1 View  04/30/2011  *RADIOLOGY REPORT*  Clinical Data: Cough, nausea  CHEST - 1 VIEW  Comparison: 03/15/2008  Findings: Mild cardiomegaly.  Linear scarring or atelectasis at the left lung base.  Right lung clear.  No effusion.  Regional bones unremarkable.  IMPRESSION:  1.  Stable linear scarring or atelectasis at the left lung base. 2.  Stable mild cardiomegaly.  Original Report Authenticated By: Trecia Rogers, M.D.   Ct Abdomen Pelvis W Contrast  04/30/2011  *RADIOLOGY REPORT*  Clinical Data: 71 year old female with weakness, nausea, fever, abdominal pain.  History of ulcerative colitis.  CT ABDOMEN AND PELVIS WITH CONTRAST  Technique:  Multidetector CT imaging of the abdomen and pelvis was performed following the standard protocol during bolus administration of intravenous contrast.  Contrast:  100 ml Omnipaque-300.  Comparison: 03/06/2010.  Findings: Minor dependent atelectasis.  No pericardial or pleural effusion.  Osteopenia. No acute osseous abnormality identified.  No pelvic free fluid.  Diffuse colonic wall thickening extends from the splenic flexure distally.  Superimposed diverticulosis of the sigmoid colon is noted with adjacent mesenteric stranding.  No extraluminal gas or fluid in this region.  The transverse colon has a more normal appearance.  There is diverticulosis at the hepatic flexure.  No inflammation of the right colon.  Negative terminal ileum.  No dilated small bowel. Stomach and duodenum within normal limits.  Gallbladder surgically absent.  Stable liver, spleen, pancreas, adrenal glands, and kidneys.  Portal venous system and major arterial structures are patent.  No abdominal free fluid or pneumoperitoneum.  IMPRESSION: 1.  Large bowel wall thickening throughout the entire left colon and rectosigmoid colon may reflect the history of inflammatory bowel disease.  Evidence of  superimposed acute diverticulitis of the sigmoid colon without complicating features. 2.  No other acute findings.  Original Report Authenticated By: Joni Fears III, M.D.    ROS Blood pressure 133/77, pulse 86, temperature 98.2 F (36.8 C), temperature source Oral, resp. rate 20, height 5' 8.5" (1.74 m), weight 143 lb 15.4 oz (65.3 kg), SpO2 95.00%. Physical ExamAlert and oriented. Skin warm and dry. Oral mucosa is moist.   . Sclera anicteric, conjunctivae is pink. Thyroid not enlarged. No cervical lymphadenopathy. Lungs clear. Heart regular rate and rhythm.  Abdomen is soft. Bowel sounds are  positive. No hepatomegaly. No abdominal masses felt. Tenderness left left quadrant and at umbilicus.  No edema to lower extremities. Patient is alert and oriented.   Assessment/Plan: UC flare. Patient is non-compliant with her home medication for UC.  She has been off her Lialda and Prednisone for over 5 months.  Recommendations: Solumedrol 58m IV q 12 hrs. I discussed this case with Dr. RLaural Golden He will be in later to interview and exam patient also.   SNolberto Hanlon2/10/2011, 9:33 AM   GI attending note; Patient interviewed and examined; lab studies reviewed along with current abdominal pelvic CT as well as prior CT from December 2011. Patient is a 71year old Caucasian female with distal ulcerative colitis off for 5 years duration and it has been difficult to treat because of side effects with medications, associated anxiety And poor understanding of the disease process on the part of patient. When I saw patient in the office in September 2012 she was doing quite well that the ALT and Canasa suppository. She stopped both of these medications stating that she developed nausea and abdominal pain. He also attempted to give her Remicade infusion which is approved by her insurance carrier she could never received treatment because of the recurrent problem it to abscess/infection. She has been intolerant of  azathioprine and 6 MP.  Now she has signs and symptoms of active disease with multiple bowel movements and she is also passing blood per rectum. Patient does not appear to be toxic and her abdominal exam reveals tenderness primarily in hypogastric region and LLQ. C. difficile toxin titer is negative. Assessment; Chronic distal ulcerative colitis with a relapse of acute symptoms. Patient does not appear to be toxic. I do not believe she has sigmoid diverticulitis. I have gone over CT with Dr. BThornton Papas Changes on current CT are very identical to the changes she had in December 2011. I agree with use of IV steroids and does could be reduced based on her weight. When acute symptoms have improved she could be switched to prednisone by mouth. If patient is willing she could be treated with methotrexate her she should consider proctocolectomy with permanent ileostomy and she would be cured. He has [facility]yll [facility]as [facility]. Recommendations; Discontinue Lovenox and provide alternative for DVT prophylaxis with SCDs. Decrease Solu-Medrol dose to 60 mg IV every 12 hours. Will check CRP and send stools for culture and O&P as well. Antibiotic therapy and he stopped as soon as stool studies completed. Dr. FOneida Alarwill be assisting you with GI management over the weekend.

## 2011-05-01 NOTE — Plan of Care (Signed)
Problem: Food- and Nutrition-Related Knowledge Deficit (NB-1.1) Goal: Nutrition education Formal process to instruct or train a patient/client in a skill or to impart knowledge to help patients/clients voluntarily manage or modify food choices and eating behavior to maintain or improve health.  Outcome: Completed/Met Date Met:  05/01/11 Pt provided overview of ulcerative colitis nutrition therapy guidelines. Topics included foods to avoid during flare ups and re-introduction of foods into the diet. Handouts provided from [employer]. Questions answered. Expect good compliance with diet recommendations.  Cleotilde Neer Pager #: [phone]    Loyce Dys, MS, RD, LDN Pager# [phone]

## 2011-05-01 NOTE — Progress Notes (Signed)
Inpatient Diabetes Program Recommendations  AACE/ADA: New Consensus Statement on Inpatient Glycemic Control (2009)  Target Ranges:  Prepandial:   less than 140 mg/dL      Peak postprandial:   less than 180 mg/dL (1-2 hours)      Critically ill patients:  140 - 180 mg/dL   Reason for Visit: Steroid-Induced Hyperglycemia: 185 mg/dL  Inpatient Diabetes Program Recommendations Correction (SSI): Add Novolog Correction

## 2011-05-01 NOTE — Progress Notes (Signed)
Subjective: Patient feels much better although she continues to have abdominal pain. She states that her dizziness is improved.  Objective: Weight change:   Intake/Output Summary (Last 24 hours) at 05/01/11 1631 Last data filed at 05/01/11 1300  Gross per 24 hour  Intake   2435 ml  Output     12 ml  Net   2423 ml    Filed Vitals:   05/01/11 1446  BP: 136/76  Pulse: 80  Temp: 98.1 F (36.7 C)  Resp: 18  General: Patient appears her stated age. Cardiovascular: Regular rate rhythm. Lungs: Clear to auscultation bilaterally no wheezes rhonchi Berrios. Abdomen: Soft diffusely tender positive bowel sounds. Extremities: No edema   Lab Results: Reviewed  Micro Results: Recent Results (from the past 240 hour(s))  CLOSTRIDIUM DIFFICILE BY PCR     Status: Normal   Collection Time   05/01/11  8:35 AM      Component Value Range Status Comment   C difficile by pcr NEGATIVE  NEGATIVE  Final     Studies/Results: Dg Chest 1 View  04/30/2011  *RADIOLOGY REPORT*  Clinical Data: Cough, nausea  CHEST - 1 VIEW  Comparison: 03/15/2008  Findings: Mild cardiomegaly.  Linear scarring or atelectasis at the left lung base.  Right lung clear.  No effusion.  Regional bones unremarkable.  IMPRESSION:  1.  Stable linear scarring or atelectasis at the left lung base. 2.  Stable mild cardiomegaly.  Original Report Authenticated By: Trecia Rogers, M.D.   Ct Abdomen Pelvis W Contrast  04/30/2011  *RADIOLOGY REPORT*  Clinical Data: 71 year old female with weakness, nausea, fever, abdominal pain.  History of ulcerative colitis.  CT ABDOMEN AND PELVIS WITH CONTRAST  Technique:  Multidetector CT imaging of the abdomen and pelvis was performed following the standard protocol during bolus administration of intravenous contrast.  Contrast:  100 ml Omnipaque-300.  Comparison: 03/06/2010.  Findings: Minor dependent atelectasis.  No pericardial or pleural effusion.  Osteopenia. No acute osseous abnormality  identified.  No pelvic free fluid.  Diffuse colonic wall thickening extends from the splenic flexure distally.  Superimposed diverticulosis of the sigmoid colon is noted with adjacent mesenteric stranding.  No extraluminal gas or fluid in this region.  The transverse colon has a more normal appearance.  There is diverticulosis at the hepatic flexure.  No inflammation of the right colon.  Negative terminal ileum.  No dilated small bowel. Stomach and duodenum within normal limits.  Gallbladder surgically absent.  Stable liver, spleen, pancreas, adrenal glands, and kidneys.  Portal venous system and major arterial structures are patent.  No abdominal free fluid or pneumoperitoneum.  IMPRESSION: 1.  Large bowel wall thickening throughout the entire left colon and rectosigmoid colon may reflect the history of inflammatory bowel disease.  Evidence of superimposed acute diverticulitis of the sigmoid colon without complicating features. 2.  No other acute findings.  Original Report Authenticated By: Randall An, M.D.   Medications: Scheduled Meds:   . azithromycin  500 mg Oral Once  . B-complex with vitamin C  1 tablet Oral Daily  . benazepril  20 mg Oral Daily   And  . hydrochlorothiazide  12.5 mg Oral Daily  . cholecalciferol  1,000 Units Oral Daily  . ciprofloxacin  400 mg Intravenous Once  . ciprofloxacin  400 mg Intravenous Q12H  . diphenhydrAMINE  25 mg Intravenous Once  . escitalopram  20 mg Oral QHS  . Flora-Q  1 capsule Oral Daily  .  HYDROmorphone (DILAUDID) injection  1 mg Intravenous Once  . methylPREDNISolone (SOLU-MEDROL) injection  60 mg Intravenous Q12H  . metoCLOPramide (REGLAN) injection  10 mg Intravenous Once  . metronidazole  500 mg Intravenous Once  . metronidazole  500 mg Intravenous Q8H  . omega-3 acid ethyl esters  2 g Oral BID  . ondansetron  4 mg Intravenous Once  . pantoprazole (PROTONIX) IV  40 mg Intravenous Q24H  . sodium chloride  1,000 mL Intravenous Once  .  sodium chloride      . vitamin A  10,000 Units Oral Daily  . vitamin C  500 mg Oral Daily  . vitamin E  400 Units Oral Daily  . DISCONTD: sodium chloride   Intravenous STAT  . DISCONTD: benazepril-hydrochlorthiazide  1 tablet Oral Daily  . DISCONTD: co-enzyme Q-10  50 mg Oral Daily  . DISCONTD: enoxaparin  40 mg Subcutaneous Q24H  . DISCONTD: methylPREDNISolone (SOLU-MEDROL) injection  60 mg Intravenous Q6H   Continuous Infusions:   . sodium chloride 100 mL/hr at 05/01/11 0009   PRN Meds:.acetaminophen, ALPRAZolam, antiseptic oral rinse, HYDROcodone-acetaminophen, HYDROmorphone (DILAUDID) injection, iohexol, ondansetron, zolpidem, DISCONTD:  HYDROmorphone (DILAUDID) injection, DISCONTD: ondansetron (ZOFRAN) IV  Assessment/Plan: Ulcerative colitis/diarrhea/rectal bleeding: Appreciate GIs recommendation. Will continue IV steroids Cipro and Flagyl. If stool studies are negative then we'll discontinue Cipro and Flagyl. Continue clear liquid diet.  Hypertension Continue with home blood pressure medications.    LOS: 1 day   [name] 05/01/2011, 4:31 PM

## 2011-05-01 NOTE — Progress Notes (Signed)
CARE MANAGEMENT NOTE 05/01/2011  Patient:  [name], [name]   Account Number:  [id number]  Date Initiated:  05/01/2011  Documentation initiated by:  Claretha Cooper  Subjective/Objective Assessment:   Pt admitted with aabdominal pain. PTA lived at home alone independent with ADL.     Action/Plan:   Spoke w/ pt at bedside. No HH needs identified at this time. CM to follow.   Anticipated DC Date:  05/04/2011   Anticipated DC Plan:  [place]  CM consult      Choice offered to / List presented to:             Status of service:  In process, will continue to follow Medicare Important Message given?   (If response is "NO", the following Medicare IM given date fields will be blank) Date Medicare IM given:   Date Additional Medicare IM given:    Discharge Disposition:    Per UR Regulation:    Comments:  05/01/11 [place] BSN CM IF HH needs identified, pt would like to use [facility]

## 2011-05-02 ENCOUNTER — Encounter (HOSPITAL_COMMUNITY): Payer: Self-pay | Admitting: Internal Medicine

## 2011-05-02 DIAGNOSIS — R519 Headache, unspecified: Secondary | ICD-10-CM | POA: Diagnosis present

## 2011-05-02 DIAGNOSIS — T50905A Adverse effect of unspecified drugs, medicaments and biological substances, initial encounter: Secondary | ICD-10-CM | POA: Diagnosis not present

## 2011-05-02 DIAGNOSIS — R51 Headache: Secondary | ICD-10-CM | POA: Diagnosis present

## 2011-05-02 DIAGNOSIS — R739 Hyperglycemia, unspecified: Secondary | ICD-10-CM

## 2011-05-02 DIAGNOSIS — K5190 Ulcerative colitis, unspecified, without complications: Secondary | ICD-10-CM

## 2011-05-02 DIAGNOSIS — D500 Iron deficiency anemia secondary to blood loss (chronic): Secondary | ICD-10-CM

## 2011-05-02 DIAGNOSIS — K922 Gastrointestinal hemorrhage, unspecified: Secondary | ICD-10-CM | POA: Diagnosis present

## 2011-05-02 HISTORY — DX: Adverse effect of unspecified drugs, medicaments and biological substances, initial encounter: T50.905A

## 2011-05-02 HISTORY — DX: Hyperglycemia, unspecified: R73.9

## 2011-05-02 HISTORY — DX: Iron deficiency anemia secondary to blood loss (chronic): D50.0

## 2011-05-02 LAB — BASIC METABOLIC PANEL
BUN: 8 mg/dL (ref 6–23)
CO2: 24 mEq/L (ref 19–32)
Calcium: 8.5 mg/dL (ref 8.4–10.5)
Glucose, Bld: 204 mg/dL — ABNORMAL HIGH (ref 70–99)
Sodium: 134 mEq/L — ABNORMAL LOW (ref 135–145)

## 2011-05-02 LAB — CBC
HCT: 29 % — ABNORMAL LOW (ref 36.0–46.0)
Hemoglobin: 9.6 g/dL — ABNORMAL LOW (ref 12.0–15.0)
MCH: 28.8 pg (ref 26.0–34.0)
MCHC: 33.1 g/dL (ref 30.0–36.0)
RBC: 3.33 MIL/uL — ABNORMAL LOW (ref 3.87–5.11)

## 2011-05-02 MED ORDER — INSULIN ASPART 100 UNIT/ML ~~LOC~~ SOLN
0.0000 [IU] | Freq: Every day | SUBCUTANEOUS | Status: DC
  Administered 2011-05-02: 0 [IU] via SUBCUTANEOUS

## 2011-05-02 MED ORDER — PANTOPRAZOLE SODIUM 40 MG PO TBEC
40.0000 mg | DELAYED_RELEASE_TABLET | Freq: Two times a day (BID) | ORAL | Status: DC
  Administered 2011-05-02 – 2011-05-05 (×6): 40 mg via ORAL
  Filled 2011-05-02 (×6): qty 1

## 2011-05-02 MED ORDER — PREDNISONE 20 MG PO TABS
40.0000 mg | ORAL_TABLET | Freq: Every day | ORAL | Status: DC
  Administered 2011-05-02 – 2011-05-05 (×4): 40 mg via ORAL
  Filled 2011-05-02 (×4): qty 2

## 2011-05-02 MED ORDER — INSULIN GLARGINE 100 UNIT/ML ~~LOC~~ SOLN
10.0000 [IU] | Freq: Every day | SUBCUTANEOUS | Status: DC
  Administered 2011-05-02: 10 [IU] via SUBCUTANEOUS
  Filled 2011-05-02: qty 3

## 2011-05-02 MED ORDER — POTASSIUM CHLORIDE IN NACL 20-0.9 MEQ/L-% IV SOLN
INTRAVENOUS | Status: DC
  Administered 2011-05-02 – 2011-05-04 (×4): via INTRAVENOUS

## 2011-05-02 MED ORDER — INSULIN ASPART 100 UNIT/ML ~~LOC~~ SOLN
0.0000 [IU] | Freq: Three times a day (TID) | SUBCUTANEOUS | Status: DC
  Administered 2011-05-02 – 2011-05-03 (×3): 1 [IU] via SUBCUTANEOUS
  Administered 2011-05-03: 2 [IU] via SUBCUTANEOUS
  Administered 2011-05-04: 3 [IU] via SUBCUTANEOUS
  Filled 2011-05-02: qty 3

## 2011-05-02 NOTE — Progress Notes (Addendum)
Subjective: The patient says that she is hungry and wants to eat solid foods. She had a global headache earlier but it is now better following pain medication. She is complaining about being given IV antibiotics because she thinks it is attributing to her loose stools. Since 4:00 this morning, she says that she has had 9 loose or watery stools. A few she had seen red blood.  Objective: Vital signs in last 24 hours: Filed Vitals:   05/01/11 2019 05/01/11 2150 05/01/11 2201 05/02/11 0506  BP: 160/74 128/76 131/72 135/76  Pulse:   76 72  Temp:   97 F (36.1 C) 97.6 F (36.4 C)  TempSrc:   Oral Oral  Resp:   18 18  Height:      Weight:      SpO2:   96% 96%    Intake/Output Summary (Last 24 hours) at 05/02/11 1023 Last data filed at 05/02/11 0700  Gross per 24 hour  Intake   4955 ml  Output    812 ml  Net   4143 ml    Weight change:   Physical exam: Lungs: Clear to auscultation bilaterally. Heart: S1, S2, with borderline bradycardia and a soft systolic murmur. Abdomen: Hypoactive bowel sounds, soft, moderately tender generally but primarily in the lower quadrants. No masses palpated. Extremities: No pedal edema.  Lab Results: Basic Metabolic Panel:  Basename 05/02/11 0500 05/01/11 0529  NA 134* 138  K 3.7 4.0  CL 102 105  CO2 24 23  GLUCOSE 204* 185*  BUN 8 8  CREATININE 0.92 0.94  CALCIUM 8.5 8.8  MG -- 1.9  PHOS -- --   Liver Function Tests:  Basename 05/01/11 0529 04/30/11 1555  AST 13 16  ALT 7 10  ALKPHOS 72 75  BILITOT 0.5 0.3  PROT 6.0 6.5  ALBUMIN 2.8* 3.0*    Basename 04/30/11 1555  LIPASE 21  AMYLASE --   No results found for this basename: AMMONIA:2 in the last 72 hours CBC:  Basename 05/02/11 0500 05/01/11 0529 04/30/11 1555  WBC 7.8 8.5 --  NEUTROABS -- 7.9* 2.8  HGB 9.6* 11.2* --  HCT 29.0* 33.7* --  MCV 87.1 88.2 --  PLT 337 366 --   Cardiac Enzymes: No results found for this basename: CKTOTAL:3,CKMB:3,CKMBINDEX:3,TROPONINI:3 in  the last 72 hours BNP: No results found for this basename: PROBNP:3 in the last 72 hours D-Dimer: No results found for this basename: DDIMER:2 in the last 72 hours CBG: No results found for this basename: GLUCAP:6 in the last 72 hours Hemoglobin A1C: No results found for this basename: HGBA1C in the last 72 hours Fasting Lipid Panel: No results found for this basename: CHOL,HDL,LDLCALC,TRIG,CHOLHDL,LDLDIRECT in the last 72 hours Thyroid Function Tests: No results found for this basename: TSH,T4TOTAL,FREET4,T3FREE,THYROIDAB in the last 72 hours Anemia Panel: No results found for this basename: VITAMINB12,FOLATE,FERRITIN,TIBC,IRON,RETICCTPCT in the last 72 hours Coagulation: No results found for this basename: LABPROT:2,INR:2 in the last 72 hours Urine Drug Screen: Drugs of Abuse  No results found for this basename: labopia, cocainscrnur, labbenz, amphetmu, thcu, labbarb    Alcohol Level: No results found for this basename: ETH:2 in the last 72 hours Urinalysis:  Basename 04/30/11 1630  COLORURINE YELLOW  LABSPEC 1.010  PHURINE 7.0  GLUCOSEU NEGATIVE  HGBUR NEGATIVE  BILIRUBINUR NEGATIVE  KETONESUR NEGATIVE  PROTEINUR NEGATIVE  UROBILINOGEN 0.2  NITRITE NEGATIVE  [place]. Labs:   Micro: Recent Results (from the past 240 hour(s))  CLOSTRIDIUM DIFFICILE BY  PCR     Status: Normal   Collection Time   05/01/11  8:35 AM      Component Value Range Status Comment   C difficile by pcr NEGATIVE  NEGATIVE  Final     Studies/Results: Dg Chest 1 View  04/30/2011  *RADIOLOGY REPORT*  Clinical Data: Cough, nausea  CHEST - 1 VIEW  Comparison: 03/15/2008  Findings: Mild cardiomegaly.  Linear scarring or atelectasis at the left lung base.  Right lung clear.  No effusion.  Regional bones unremarkable.  IMPRESSION:  1.  Stable linear scarring or atelectasis at the left lung base. 2.  Stable mild cardiomegaly.  Original Report Authenticated By: Trecia Rogers, M.D.    Ct Abdomen Pelvis W Contrast  04/30/2011  *RADIOLOGY REPORT*  Clinical Data: 71 year old female with weakness, nausea, fever, abdominal pain.  History of ulcerative colitis.  CT ABDOMEN AND PELVIS WITH CONTRAST  Technique:  Multidetector CT imaging of the abdomen and pelvis was performed following the standard protocol during bolus administration of intravenous contrast.  Contrast:  100 ml Omnipaque-300.  Comparison: 03/06/2010.  Findings: Minor dependent atelectasis.  No pericardial or pleural effusion.  Osteopenia. No acute osseous abnormality identified.  No pelvic free fluid.  Diffuse colonic wall thickening extends from the splenic flexure distally.  Superimposed diverticulosis of the sigmoid colon is noted with adjacent mesenteric stranding.  No extraluminal gas or fluid in this region.  The transverse colon has a more normal appearance.  There is diverticulosis at the hepatic flexure.  No inflammation of the right colon.  Negative terminal ileum.  No dilated small bowel. Stomach and duodenum within normal limits.  Gallbladder surgically absent.  Stable liver, spleen, pancreas, adrenal glands, and kidneys.  Portal venous system and major arterial structures are patent.  No abdominal free fluid or pneumoperitoneum.  IMPRESSION: 1.  Large bowel wall thickening throughout the entire left colon and rectosigmoid colon may reflect the history of inflammatory bowel disease.  Evidence of superimposed acute diverticulitis of the sigmoid colon without complicating features. 2.  No other acute findings.  Original Report Authenticated By: Randall An, M.D.    Medications: I have reviewed the patient's current medications.  Assessment: Principal Problem:  *ULCERATIVE COLITIS, LEFT SIDED Active Problems:  HYPERTENSION  Depression  Hyperglycemia, drug-induced  Lower GI bleeding  Anemia due to blood loss  Headache   1. Acute flareup of ulcerative colitis with lower GI bleeding. Dr. Olevia Perches assessment  is noted and appreciated. She is currently being treated with steroids, flora Q., Flagyl, and Cipro. She wants her diet advanced, but will defer this to GI. C. difficile toxin is negative. Other stool studies are pending.   Anemia secondary to acute blood loss. The patient's hemoglobin has fallen approximately 2 g. This will continue to be monitored. If it continues to fall precipitously, she will need to be transfused.  Hypertension. Her blood pressure was elevated earlier but has normalized. She is being maintained on benazepril, hydrochlorothiazide, and clonidine. Next   Hyperglycemia, likely steroid induced. We'll start sliding scale NovoLog and check a hemoglobin A1c.    Plan:  1. Diet advancement and other recommendations per GI regarding treatment of ulcerative colitis. Continue to monitor her hemoglobin and hematocrit. Will add potassium to the IV fluids. We'll start sliding scale NovoLog and Lantus. Will order hemoglobin A1c.   LOS: 2 days   Ruger Saxer 05/02/2011, 10:23 AM

## 2011-05-02 NOTE — Progress Notes (Signed)
Patient up and ambulating in hall independently with no difficulties

## 2011-05-02 NOTE — Progress Notes (Signed)
Subjective: admittted with uc flare. PT UNABLE TO TOLERATE MESALAMINE PRODUCTS AND AZATHIOPRINE. INITIAL CT: ? Diverticulitis-REVIEWED WITH DR. Arelia Longest DIVERTICULITIS. PT TOLERATING CLEAR LIQUID DIET. BMs: 9 since 4 am but starting to form up. Requesting vitamins and Abx be stopped. Mild nausea. No vomiting. Abd pain improved.   Objective: Vital signs in last 24 hours: Temp:  [97 F (36.1 C)-98.1 F (36.7 C)] 97.6 F (36.4 C) (02/09 0506) Pulse Rate:  [72-80] 72  (02/09 0506) Resp:  [18] 18  (02/09 0506) BP: (128-160)/(72-76) 135/76 mmHg (02/09 0506) SpO2:  [96 %] 96 % (02/09 0506) Last BM Date: 05/01/11  Intake/Output from previous day: 02/08 0701 - 02/09 0700 In: 4955 [P.O.:1855; I.V.:2400; IV Piggyback:700] Out: 812 [Urine:807; Stool:5] Intake/Output this shift:    General appearance: alert, cooperative and appears stated age Resp: clear to auscultation bilaterally Cardio: regular rate and rhythm GI: soft, non-tender; bowel sounds normal; no masses,  no organomegaly Extremities: no edema, redness or tenderness in the calves or thighs  Lab Results:  Basename 05/02/11 0500 05/01/11 0529 04/30/11 1555  WBC 7.8 8.5 4.9  HGB 9.6* 11.2* 11.2*  HCT 29.0* 33.7* 34.2*  PLT 337 366 401*   BMET  Basename 05/02/11 0500 05/01/11 0529 04/30/11 1555  NA 134* 138 137  K 3.7 4.0 3.6  CL 102 105 99  CO2 24 23 29   GLUCOSE 204* 185* 126*  BUN 8 8 11   CREATININE 0.92 0.94 0.93  CALCIUM 8.5 8.8 9.4   LFT  Basename 05/01/11 0529  PROT 6.0  ALBUMIN 2.8*  AST 13  ALT 7  ALKPHOS 72  BILITOT 0.5  BILIDIR --  IBILI --   Studies/Results: Dg Chest 1 View  04/30/2011  *RADIOLOGY REPORT*  Clinical Data: Cough, nausea  CHEST - 1 VIEW  Comparison: 03/15/2008  Findings: Mild cardiomegaly.  Linear scarring or atelectasis at the left lung base.  Right lung clear.  No effusion.  Regional bones unremarkable.  IMPRESSION:  1.  Stable linear scarring or atelectasis at the left lung base. 2.   Stable mild cardiomegaly.  Original Report Authenticated By: Trecia Rogers, M.D.   Ct Abdomen Pelvis W Contrast  04/30/2011  *RADIOLOGY REPORT*  Clinical Data: 71 year old female with weakness, nausea, fever, abdominal pain.  History of ulcerative colitis.  CT ABDOMEN AND PELVIS WITH CONTRAST  Technique:  Multidetector CT imaging of the abdomen and pelvis was performed following the standard protocol during bolus administration of intravenous contrast.  Contrast:  100 ml Omnipaque-300.  Comparison: 03/06/2010.  Findings: Minor dependent atelectasis.  No pericardial or pleural effusion.  Osteopenia. No acute osseous abnormality identified.  No pelvic free fluid.  Diffuse colonic wall thickening extends from the splenic flexure distally.  Superimposed diverticulosis of the sigmoid colon is noted with adjacent mesenteric stranding.  No extraluminal gas or fluid in this region.  The transverse colon has a more normal appearance.  There is diverticulosis at the hepatic flexure.  No inflammation of the right colon.  Negative terminal ileum.  No dilated small bowel. Stomach and duodenum within normal limits.  Gallbladder surgically absent.  Stable liver, spleen, pancreas, adrenal glands, and kidneys.  Portal venous system and major arterial structures are patent.  No abdominal free fluid or pneumoperitoneum.  IMPRESSION: 1.  Large bowel wall thickening throughout the entire left colon and rectosigmoid colon may reflect the history of inflammatory bowel disease.  Evidence of superimposed acute diverticulitis of the sigmoid colon without complicating features. 2.  No other acute  findings.  Original Report Authenticated By: Randall An, M.D.    Medications: I have reviewed the patient's current medications.  Assessment/Plan: Pt admitted with UC FLARE. CLINICALLY IMPROVED. NO DIVERTICULITIS ON CT.  PLAN: 1. ADVANCE DIET 2. D/C SOLUMEDROL. CHANGE TO PREDNISONE. REMICADE AS AN OP. 3. D/C CIP/FLAG 4.  CONTINUE FLORA Q DAILY. 5. D/C VITAMINS EXCEPT VIT C & D.   LOS: 2 days   Sandi Fields 05/02/2011, 11:09 AM

## 2011-05-03 ENCOUNTER — Other Ambulatory Visit: Payer: Self-pay

## 2011-05-03 ENCOUNTER — Inpatient Hospital Stay (HOSPITAL_COMMUNITY): Payer: Medicare PPO

## 2011-05-03 DIAGNOSIS — R079 Chest pain, unspecified: Secondary | ICD-10-CM | POA: Diagnosis not present

## 2011-05-03 DIAGNOSIS — R001 Bradycardia, unspecified: Secondary | ICD-10-CM | POA: Diagnosis not present

## 2011-05-03 LAB — TSH: TSH: 0.867 u[IU]/mL (ref 0.350–4.500)

## 2011-05-03 LAB — CBC
HCT: 28.1 % — ABNORMAL LOW (ref 36.0–46.0)
MCH: 29.1 pg (ref 26.0–34.0)
MCV: 87.8 fL (ref 78.0–100.0)
RBC: 3.2 MIL/uL — ABNORMAL LOW (ref 3.87–5.11)
WBC: 8.8 10*3/uL (ref 4.0–10.5)

## 2011-05-03 LAB — GLUCOSE, CAPILLARY: Glucose-Capillary: 108 mg/dL — ABNORMAL HIGH (ref 70–99)

## 2011-05-03 LAB — BASIC METABOLIC PANEL
BUN: 10 mg/dL (ref 6–23)
CO2: 25 mEq/L (ref 19–32)
Chloride: 105 mEq/L (ref 96–112)
Creatinine, Ser: 0.93 mg/dL (ref 0.50–1.10)
Glucose, Bld: 134 mg/dL — ABNORMAL HIGH (ref 70–99)

## 2011-05-03 LAB — LIPASE, BLOOD: Lipase: 27 U/L (ref 11–59)

## 2011-05-03 LAB — T4, FREE: Free T4: 1.12 ng/dL (ref 0.80–1.80)

## 2011-05-03 LAB — CARDIAC PANEL(CRET KIN+CKTOT+MB+TROPI): CK, MB: 2.2 ng/mL (ref 0.3–4.0)

## 2011-05-03 MED ORDER — ALUM & MAG HYDROXIDE-SIMETH 200-200-20 MG/5ML PO SUSP
15.0000 mL | Freq: Three times a day (TID) | ORAL | Status: DC
  Administered 2011-05-03 (×3): 15 mL via ORAL
  Filled 2011-05-03 (×3): qty 30

## 2011-05-03 MED ORDER — AMLODIPINE BESYLATE 5 MG PO TABS
5.0000 mg | ORAL_TABLET | Freq: Every day | ORAL | Status: DC
  Administered 2011-05-03 – 2011-05-04 (×2): 5 mg via ORAL
  Filled 2011-05-03 (×2): qty 1

## 2011-05-03 MED ORDER — INSULIN GLARGINE 100 UNIT/ML ~~LOC~~ SOLN
12.0000 [IU] | Freq: Every day | SUBCUTANEOUS | Status: DC
  Administered 2011-05-03: 12 [IU] via SUBCUTANEOUS

## 2011-05-03 NOTE — Progress Notes (Signed)
Subjective: Since I last evaluated the patient she has had 10 stools since MN. "They have some form". No nausea or vomiting. C/o chest discomfort mAde better with MAALOX.  NO BRBPR. TOLERATING HER DIET.  Objective: Vital signs in last 24 hours: Temp:  [98 F (36.7 C)-98.1 F (36.7 C)] 98 F (36.7 C) (02/10 0539) Pulse Rate:  [56-73] 65  (02/10 1132) Resp:  [18-20] 18  (02/10 0539) BP: (144-161)/(64-80) 144/70 mmHg (02/10 1132) SpO2:  [97 %-99 %] 97 % (02/10 0539) Last BM Date: 05/03/11  Intake/Output from previous day: 02/09 0701 - 02/10 0700 In: 7703.3 [P.O.:960; I.V.:6743.3] Out: 301 [Urine:300; Stool:1] Intake/Output this shift: Total I/O In: 280 [P.O.:280] Out: -   General appearance: alert, cooperative and no distress GI: soft, MILD TTP IN EPIGASTRIUM, RUQ, & RLQ W/O REBOUND OR GUARDING; bowel sounds normal; no masses,  no organomegaly  Lab Results:  Basename 05/03/11 0500 05/02/11 0500 05/01/11 0529  WBC 8.8 7.8 8.5  HGB 9.3* 9.6* 11.2*  HCT 28.1* 29.0* 33.7*  PLT 290 337 366   BMET  Basename 05/03/11 0500 05/02/11 0500 05/01/11 0529  NA 136 134* 138  K 3.7 3.7 4.0  CL 105 102 105  CO2 25 24 23   GLUCOSE 134* 204* 185*  BUN 10 8 8   CREATININE 0.93 0.92 0.94  CALCIUM 8.1* 8.5 8.8   LFT  Basename 05/01/11 0529  PROT 6.0  ALBUMIN 2.8*  AST 13  ALT 7  ALKPHOS 72  BILITOT 0.5  BILIDIR --  IBILI --   PT/INR No results found for this basename: LABPROT:2,INR:2 in the last 72 hours Hepatitis Panel No results found for this basename: HEPBSAG,HCVAB,HEPAIGM,HEPBIGM in the last 72 hours C-Diff No results found for this basename: CDIFFTOX:3 in the last 72 hours Fecal Lactopherrin No results found for this basename: FECLLACTOFRN in the last 72 hours  Studies/Results: Dg Chest 2 View  05/03/2011  *RADIOLOGY REPORT*  Clinical Data: Chest pain, epigastric pain  CHEST - 2 VIEW  Comparison: 04/30/2011  Findings: Cardiomegaly again noted.  No acute infiltrate or  pulmonary edema.  Mild elevation of the right hemidiaphragm.  Mild basilar atelectasis.  Degenerative changes mid thoracic spine.  IMPRESSION: No active disease.  No significant change.  Cardiomegaly again noted.  Original Report Authenticated By: Lahoma Crocker, M.D.    Medications: I have reviewed the patient's current medications.  Assessment/Plan: ADMITTED WITH UC FLARE. HAVING GI UPSET FROM STEROIDS. STOOL FORMING BUT STILL > 10.  PLAN: 1. CONTINUE STEROIDS. 2. BUD PPI. 3. MAALOX PRN NO > 3 DOSES PER DAY. 4. CONTINUE PROBIOTIC.   LOS: 3 days   Crispin Vogel 05/03/2011, 1:06 PM

## 2011-05-03 NOTE — Progress Notes (Signed)
Subjective: The patient says that she had 6 loose bowel movements overnight, but she believes they are forming. She saw no blood. She complains of epigastric and substernal chest pain that has been intermittent since last night. She is not sure if his indigestion. She has had chest pain in the past which eventually went away. She says that she had a stress test about 2-3 years ago and it was normal.  Objective: Vital signs in last 24 hours: Filed Vitals:   05/02/11 0506 05/02/11 1507 05/02/11 2051 05/03/11 0539  BP: 135/76 154/80 155/71 161/64  Pulse: 72 73 73 56  Temp: 97.6 F (36.4 C) 98.1 F (36.7 C) 98.1 F (36.7 C) 98 F (36.7 C)  TempSrc: Oral Oral Oral Oral  Resp: 18 20 18 18   Height:      Weight:      SpO2: 96% 99% 97% 97%    Intake/Output Summary (Last 24 hours) at 05/03/11 1024 Last data filed at 05/03/11 0900  Gross per 24 hour  Intake 7983.34 ml  Output    301 ml  Net 7682.34 ml    Weight change:   Physical exam: Lungs: Clear to auscultation bilaterally. Heart: S1, S2, with bradycardia. Abdomen: Positive bowel sounds, soft, mildly to moderately tender in the epigastrium and lower quadrants. No masses palpated. No obvious distention. Extremities: No pedal edema.  Lab Results: Basic Metabolic Panel:  Basename 05/03/11 0500 05/02/11 0500 05/01/11 0529  NA 136 134* --  K 3.7 3.7 --  CL 105 102 --  CO2 25 24 --  GLUCOSE 134* 204* --  BUN 10 8 --  CREATININE 0.93 0.92 --  CALCIUM 8.1* 8.5 --  MG -- -- 1.9  PHOS -- -- --   Liver Function Tests:  Basename 05/01/11 0529 04/30/11 1555  AST 13 16  ALT 7 10  ALKPHOS 72 75  BILITOT 0.5 0.3  PROT 6.0 6.5  ALBUMIN 2.8* 3.0*    Basename 04/30/11 1555  LIPASE 21  AMYLASE --   No results found for this basename: AMMONIA:2 in the last 72 hours CBC:  Basename 05/03/11 0500 05/02/11 0500 05/01/11 0529 04/30/11 1555  WBC 8.8 7.8 -- --  NEUTROABS -- -- 7.9* 2.8  HGB 9.3* 9.6* -- --  HCT 28.1* 29.0* -- --   MCV 87.8 87.1 -- --  PLT 290 337 -- --   Cardiac Enzymes: No results found for this basename: CKTOTAL:3,CKMB:3,CKMBINDEX:3,TROPONINI:3 in the last 72 hours BNP: No results found for this basename: PROBNP:3 in the last 72 hours D-Dimer: No results found for this basename: DDIMER:2 in the last 72 hours CBG:  Basename 05/03/11 0715 05/02/11 2046 05/02/11 1616 05/02/11 1135  GLUCAP 108* 199* 145* 150*   Hemoglobin A1C:  Basename 05/02/11 0500  HGBA1C 6.0*   Fasting Lipid Panel: No results found for this basename: CHOL,HDL,LDLCALC,TRIG,CHOLHDL,LDLDIRECT in the last 72 hours Thyroid Function Tests: No results found for this basename: TSH,T4TOTAL,FREET4,T3FREE,THYROIDAB in the last 72 hours Anemia Panel: No results found for this basename: VITAMINB12,FOLATE,FERRITIN,TIBC,IRON,RETICCTPCT in the last 72 hours Coagulation: No results found for this basename: LABPROT:2,INR:2 in the last 72 hours Urine Drug Screen: Drugs of Abuse  No results found for this basename: labopia,  cocainscrnur,  labbenz,  amphetmu,  thcu,  labbarb    Alcohol Level: No results found for this basename: ETH:2 in the last 72 hours Urinalysis:  Basename 04/30/11 1630  COLORURINE YELLOW  LABSPEC 1.010  PHURINE 7.0  [place]  KETONESUR NEGATIVE  PROTEINUR NEGATIVE  UROBILINOGEN 0.2  NITRITE NEGATIVE  LEUKOCYTESUR SMALL*   Misc. Labs:   Micro: Recent Results (from the past 240 hour(s))  CLOSTRIDIUM DIFFICILE BY PCR     Status: Normal   Collection Time   05/01/11  8:35 AM      Component Value Range Status Comment   C difficile by pcr NEGATIVE  NEGATIVE  Final     Studies/Results: No results found.  Medications: I have reviewed the patient's current medications.  Assessment: Principal Problem:  *ULCERATIVE COLITIS, LEFT SIDED Active Problems:  HYPERTENSION  Depression  Hyperglycemia, drug-induced  Lower GI bleeding  Anemia due to blood  loss  Headache  Chest pain  Bradycardia   1. Acute flareup of ulcerative colitis with lower GI bleeding. Dr. Olevia Perches and Dr. Oneida Alar' assessments are noted and appreciated. She is currently being treated with steroids and flora Q.  Flagyl, and Cipro were discontinued yesterday by GI. Her diet was advanced as well and she appears to be tolerating it some although she is not eating a lot. She continues to have loose stools, but the bleeding appears to be subsiding or even stopping. C. difficile toxin is negative. Other stool studies are pending.   Anemia secondary to acute blood loss. The patient's hemoglobin has fallen approximately 2 g. This will continue to be monitored. If it continues to fall precipitously, she will need to be transfused.  Chest pain. This appears to be noncardiac. However, given her risk factors, a chest x-ray, cardiac enzymes, and EKG will be ordered. She is on Protonix.  Hypertension. Not optimal at times. Aggressive treatment of her blood pressure should be deferred to her primary care physician. The patient says her blood pressure is elevated from the steroids and from pain, which may be true. She takes benazepril/HCTZ chronically at home. Apparently, clonidine was added a couple of nights ago. I believe it is contributing to her bradycardia. It will be discontinued in favor of Norvasc.    Hyperglycemia, likely steroid induced. We'll continue sliding scale  NovoLog and Lantus. Her  hemoglobin A1c is 6.0.    Plan:  1. We'll order chest x-ray, EKG, and one set of cardiac enzymes. Also check lipase. Will start Mylanta, scheduled 3 times daily. Wean prednisone per GIs recommendation. Add Norvasc and discontinue clonidine. Will check her TSH and free T4 for evaluation of bradycardia. Probable discharge tomorrow.    LOS: 3 days   Luane Rochon 05/03/2011, 10:24 AM

## 2011-05-04 DIAGNOSIS — J019 Acute sinusitis, unspecified: Secondary | ICD-10-CM | POA: Diagnosis present

## 2011-05-04 LAB — BASIC METABOLIC PANEL
BUN: 9 mg/dL (ref 6–23)
Chloride: 105 mEq/L (ref 96–112)
Creatinine, Ser: 1.07 mg/dL (ref 0.50–1.10)
Glucose, Bld: 88 mg/dL (ref 70–99)
Potassium: 3.2 mEq/L — ABNORMAL LOW (ref 3.5–5.1)

## 2011-05-04 LAB — GLUCOSE, CAPILLARY
Glucose-Capillary: 86 mg/dL (ref 70–99)
Glucose-Capillary: 206 mg/dL — ABNORMAL HIGH (ref 70–99)

## 2011-05-04 LAB — CBC
HCT: 30 % — ABNORMAL LOW (ref 36.0–46.0)
Hemoglobin: 9.7 g/dL — ABNORMAL LOW (ref 12.0–15.0)
MCHC: 32.3 g/dL (ref 30.0–36.0)
MCV: 88 fL (ref 78.0–100.0)
RDW: 15.2 % (ref 11.5–15.5)

## 2011-05-04 MED ORDER — AMLODIPINE BESYLATE 5 MG PO TABS
10.0000 mg | ORAL_TABLET | Freq: Every day | ORAL | Status: DC
  Administered 2011-05-05: 10 mg via ORAL
  Filled 2011-05-04: qty 2

## 2011-05-04 MED ORDER — LOPERAMIDE HCL 2 MG PO CAPS
2.0000 mg | ORAL_CAPSULE | Freq: Every day | ORAL | Status: DC
  Administered 2011-05-04: 2 mg via ORAL
  Filled 2011-05-04: qty 1

## 2011-05-04 MED ORDER — SALINE SPRAY 0.65 % NA SOLN
2.0000 | NASAL | Status: DC | PRN
  Filled 2011-05-04: qty 44

## 2011-05-04 MED ORDER — INSULIN GLARGINE 100 UNIT/ML ~~LOC~~ SOLN
5.0000 [IU] | Freq: Every day | SUBCUTANEOUS | Status: DC
  Administered 2011-05-05: 5 [IU] via SUBCUTANEOUS

## 2011-05-04 MED ORDER — POTASSIUM CHLORIDE CRYS ER 20 MEQ PO TBCR
40.0000 meq | EXTENDED_RELEASE_TABLET | Freq: Once | ORAL | Status: AC
  Administered 2011-05-04: 40 meq via ORAL
  Filled 2011-05-04: qty 2

## 2011-05-04 MED ORDER — OXYMETAZOLINE HCL 0.05 % NA SOLN
2.0000 | Freq: Two times a day (BID) | NASAL | Status: DC
  Administered 2011-05-04 – 2011-05-05 (×3): 2 via NASAL
  Filled 2011-05-04: qty 15

## 2011-05-04 MED ORDER — ALUM & MAG HYDROXIDE-SIMETH 200-200-20 MG/5ML PO SUSP: 15.0000 mL | Freq: Four times a day (QID) | ORAL | Status: DC | PRN

## 2011-05-04 NOTE — Progress Notes (Signed)
Chart reviewed.  Subjective: No abdominal pain. No chest pain. Mainly complaining of sinus pressure, pain, postnasal drip. She reports that she had this on admission. And had had it for several weeks prior to admission. Stools becoming less frequent. Feels weak. Does not feel well enough to go home today. Tolerating a diet. She reports usually having about 10 stools a day even when at baseline. Complains of difficulty sleeping as well.  Objective: Vital signs in last 24 hours: Filed Vitals:   05/03/11 1132 05/03/11 1344 05/03/11 2118 05/04/11 0502  BP: 144/70 152/79 158/70 172/75  Pulse: 65 56 54 77  Temp:  97.9 F (36.6 C) 98 F (36.7 C) 98.1 F (36.7 C)  TempSrc:  Oral Oral Oral  Resp:  16 18 18   Height:      Weight:      SpO2:  96% 97% 100%   Weight change:   Intake/Output Summary (Last 24 hours) at 05/04/11 1149 Last data filed at 05/04/11 0834  Gross per 24 hour  Intake   2890 ml  Output      0 ml  Net   2890 ml   Physical Exam: General: Comfortable. Talkative. Nontoxic appearing. HEENT: Sinus pressure present in the bilateral frontal sinus and maxillary sinus area. Tympanic membranes without bulging or erythema. No obvious fluid under the membrane. Partially obscured by wax however. Lungs clear to auscultation bilaterally without wheeze rhonchi or rales Cardiovascular regular rate rhythm without murmurs gallops rubs Abdomen soft nontender nondistended Extremities no clubbing cyanosis or edema Lab Results: Basic Metabolic Panel:  Lab 81/15/72 0512 05/03/11 0500 05/01/11 0529  NA 140 136 --  K 3.2* 3.7 --  CL 105 105 --  CO2 30 25 --  GLUCOSE 88 134* --  BUN 9 10 --  CREATININE 1.07 0.93 --  CALCIUM 8.5 8.1* --  MG -- -- 1.9  PHOS -- -- --   Liver Function Tests:  Lab 05/01/11 0529 04/30/11 1555  AST 13 16  ALT 7 10  ALKPHOS 72 75  BILITOT 0.5 0.3  PROT 6.0 6.5  ALBUMIN 2.8* 3.0*    Lab 05/03/11 1102 04/30/11 1555  LIPASE 27 21  AMYLASE -- --    No results found for this basename: AMMONIA:2 in the last 168 hours CBC:  Lab 05/04/11 0512 05/03/11 0500 05/01/11 0529 04/30/11 1555  WBC 8.3 8.8 -- --  NEUTROABS -- -- 7.9* 2.8  HGB 9.7* 9.3* -- --  HCT 30.0* 28.1* -- --  MCV 88.0 87.8 -- --  PLT 319 290 -- --   Cardiac Enzymes:  Lab 05/03/11 1102  CKTOTAL 68  CKMB 2.2  CKMBINDEX --  TROPONINI <0.30   BNP: No results found for this basename: PROBNP:3 in the last 168 hours D-Dimer: No results found for this basename: DDIMER:2 in the last 168 hours CBG:  Lab 05/04/11 1118 05/04/11 0711 05/03/11 2110 05/03/11 1633 05/03/11 1137 05/03/11 0715  GLUCAP 100* 86 120* 173* 149* 108*   Hemoglobin A1C:  Lab 05/02/11 0500  HGBA1C 6.0*   Fasting Lipid Panel: No results found for this basename: CHOL,HDL,LDLCALC,TRIG,CHOLHDL,LDLDIRECT in the last 168 hours Thyroid Function Tests:  Lab 05/03/11 1102  TSH 0.867  T4TOTAL --  FREET4 1.12  T3FREE --  THYROIDAB --   Coagulation: No results found for this basename: LABPROT:4,INR:4 in the last 168 hours Anemia Panel: No results found for this basename: VITAMINB12,FOLATE,FERRITIN,TIBC,IRON,RETICCTPCT in the last 168 hours Urine Drug Screen: Drugs of Abuse  No results found for  this basename: labopia, cocainscrnur, labbenz, amphetmu, thcu, labbarb    Alcohol Level: No results found for this basename: ETH:2 in the last 168 hours Urinalysis:  Lab 04/30/11 1630  COLORURINE YELLOW  LABSPEC 1.010  PHURINE 7.0  GLUCOSEU NEGATIVE  HGBUR NEGATIVE  BILIRUBINUR NEGATIVE  KETONESUR NEGATIVE  PROTEINUR NEGATIVE  UROBILINOGEN 0.2  NITRITE NEGATIVE  LEUKOCYTESUR SMALL*    Micro Results: Recent Results (from the past 240 hour(s))  CLOSTRIDIUM DIFFICILE BY PCR     Status: Normal   Collection Time   05/01/11  8:35 AM      Component Value Range Status Comment   C difficile by pcr NEGATIVE  NEGATIVE  Final   STOOL CULTURE     Status: Normal (Preliminary result)   Collection Time    05/02/11  3:39 AM      Component Value Range Status Comment   Specimen Description STOOL   Final    Special Requests Normal   Final    Culture NO SUSPICIOUS COLONIES, CONTINUING TO HOLD   Final    Report Status PENDING   Incomplete    Studies/Results: Dg Chest 2 View  05/03/2011  *RADIOLOGY REPORT*  Clinical Data: Chest pain, epigastric pain  CHEST - 2 VIEW  Comparison: 04/30/2011  Findings: Cardiomegaly again noted.  No acute infiltrate or pulmonary edema.  Mild elevation of the right hemidiaphragm.  Mild basilar atelectasis.  Degenerative changes mid thoracic spine.  IMPRESSION: No active disease.  No significant change.  Cardiomegaly again noted.  Original Report Authenticated By: Lahoma Crocker, M.D.   Scheduled Meds:   . amLODipine  10 mg Oral Daily  . B-complex with vitamin C  1 tablet Oral Daily  . benazepril  20 mg Oral Daily  . cholecalciferol  1,000 Units Oral Daily  . escitalopram  20 mg Oral QHS  . Flora-Q  1 capsule Oral Daily  . insulin aspart  0-5 Units Subcutaneous QHS  . insulin aspart  0-9 Units Subcutaneous TID WC  . insulin glargine  12 Units Subcutaneous QHS  . loperamide  2 mg Oral QPC breakfast  . oxymetazoline  2 spray Each Nare BID  . pantoprazole  40 mg Oral BID AC  . predniSONE  40 mg Oral Q breakfast  . vitamin C  500 mg Oral Daily  . DISCONTD: alum & mag hydroxide-simeth  15 mL Oral TID  . DISCONTD: amLODipine  5 mg Oral Daily  . DISCONTD: cloNIDine  0.1 mg Oral BID  . DISCONTD: hydrochlorothiazide  12.5 mg Oral Daily   Continuous Infusions:   . DISCONTD: 0.9 % NaCl with KCl 20 mEq / [name] 60 mL/hr at 05/04/11 0154   PRN Meds:.acetaminophen, ALPRAZolam, alum & mag hydroxide-simeth, antiseptic oral rinse, HYDROcodone-acetaminophen, HYDROmorphone (DILAUDID) injection, ondansetron, sodium chloride, zolpidem Assessment/Plan: Principal Problem:  *ULCERATIVE COLITIS, LEFT SIDED Active Problems:  Acute sinusitis  HYPERTENSION, needs better control   Depression  Hyperglycemia, drug-induced  Lower GI bleeding  Anemia due to blood loss  Headache  Chest pain  Bradycardia  hypokalemia  Will add Afrin and saline spray for acute sinusitis. The patient at this time refuses antibiotics, decongestants, or antihistamines.  Hep-Lock IV and increase Norvasc to 10 mg a day which should help blood pressure.  Ulcerative colitis symptoms are improving.  Replete potassium.  Discontinue telemetry. Chest pain is related to GI issues, not cardiac. Hopefully home tomorrow.   LOS: 4 days   [name] 05/04/2011, 11:49 AM

## 2011-05-04 NOTE — Progress Notes (Signed)
Subjective; Patient states her epigastric and lower chest pain has resolved. She is now complaining of headache and believes she has sinusitis. She states she made 18 trips to the bathroom and lost 24-hours but most of her bowel movements consisted a small amount of stool. No rectal bleeding noted in the last 48 hours. She denies abdominal pain. Her appetite is good. She complains of being nervous which she blames on prednisone. Objective; BP 172/75  Pulse 77  Temp(Src) 98.1 F (36.7 C) (Oral)  Resp 18  Ht 5' 8.5" (1.74 m)  Wt 143 lb 15.4 oz (65.3 kg)  BMI 21.57 kg/m2  SpO2 100% Abdomen is soft and non-tender No heavy edema noted. Lab data; WBC 8.3, hemoglobin 9.7, hematocrit 30, platelet count 319K. C. reactive protein from 05/02/2011 is 0.79(normal less than 0.6). Stool culture no pathogen. O&P pending. Assessment; #1. Distal ulcerative colitis. She appears to be responding to steroid therapy. She is still having multiple bowel movements but most of the time she passes a scant amount of stool. We should be able to reduce frequency by adding low-dose Imodium.  #2. Anemia secondary to GI blood loss. H&H is coming up. #3. Headache ? Sinusitis. Will defer to therapy to  Dr. Conley Canal. #4. Epigastric/chest pain possibly due to GERD. Patient on PPI and antacid. Recommendations; Change Maalox to when necessary. Loperamide 2 mg by mouth daily. Will ask that she go back on the Lialda when she goes home. Prednisone will be tapered over the next 6-8 weeks.

## 2011-05-05 LAB — OVA AND PARASITE EXAMINATION

## 2011-05-05 LAB — GLUCOSE, CAPILLARY: Glucose-Capillary: 116 mg/dL — ABNORMAL HIGH (ref 70–99)

## 2011-05-05 MED ORDER — SALINE SPRAY 0.65 % NA SOLN: 2.0000 | NASAL | Status: DC | PRN

## 2011-05-05 MED ORDER — PREDNISONE 20 MG PO TABS: 40.0000 mg | ORAL_TABLET | Freq: Every day | ORAL | Status: AC

## 2011-05-05 MED ORDER — LOPERAMIDE HCL 2 MG PO CAPS: 2.0000 mg | ORAL_CAPSULE | Freq: Every day | ORAL | Status: AC

## 2011-05-05 MED ORDER — OXYMETAZOLINE HCL 0.05 % NA SOLN: 2.0000 | Freq: Two times a day (BID) | NASAL | Status: AC

## 2011-05-05 MED ORDER — SODIUM CHLORIDE 0.9 % IJ SOLN
INTRAMUSCULAR | Status: AC
  Administered 2011-05-05: 10 mL
  Filled 2011-05-05: qty 3

## 2011-05-05 NOTE — Progress Notes (Signed)
Patient's IV is out of date. Patient stated she was probably going home today and did not want RN to change IV site.

## 2011-05-05 NOTE — Discharge Summary (Signed)
Physician Discharge Summary  Patient ID: [name] MRN: [id number] DOB/AGE: 25-Mar-1940 71 y.o.  Admit date: 04/30/2011 Discharge date: 05/05/2011  Discharge Diagnoses:  Principal Problem:  *ULCERATIVE COLITIS, LEFT SIDED Active Problems:  Acute sinusitis  HYPERTENSION  Depression  Hyperglycemia, drug-induced  Lower GI bleeding  Anemia due to blood loss  Headache  Chest pain  Bradycardia   Medication List  As of 05/05/2011  9:57 AM   TAKE these medications         ALPRAZolam 0.5 MG tablet   Commonly known as: XANAX   Take 0.5 mg by mouth 2 (two) times daily as needed. FOR ANXIETY        antiseptic oral rinse Liqd   1 application by Mouth Rinse route as needed. FOR DRY MOUTH      B Complex Caps   Take 1 capsule by mouth daily.      benazepril-hydrochlorthiazide 20-12.5 MG per tablet   Commonly known as: LOTENSIN HCT   Take 1 tablet by mouth daily.      co-enzyme Q-10 50 MG capsule   Take 50 mg by mouth daily.      escitalopram 20 MG tablet   Commonly known as: LEXAPRO   Take 20 mg by mouth at bedtime.      Fish Oil 1000 MG Cpdr   Take 1 capsule by mouth daily.      HYDROcodone-acetaminophen 5-500 MG per tablet   Commonly known as: VICODIN   Take 1 tablet by mouth every 6 (six) hours as needed. For pain      loperamide 2 MG capsule   Commonly known as: IMODIUM   Take 1 capsule (2 mg total) by mouth daily after breakfast.      ondansetron 4 MG tablet   Commonly known as: ZOFRAN   Take 4 mg by mouth as needed. For nausea      oxymetazoline 0.05 % nasal spray   Commonly known as: AFRIN   Place 2 sprays into the nose 2 (two) times daily.      predniSONE 20 MG tablet   Commonly known as: DELTASONE   Take 2 tablets (40 mg total) by mouth daily with breakfast.      PROBIOTIC FORMULA PO   Take 1-2 capsules by mouth daily.      SENSI-CARE PROTECTIVE BARRIER EX   Apply 1 application topically daily.      sodium chloride 0.65 % Soln nasal spray   Commonly known as: OCEAN   Place 2 sprays into the nose as needed for congestion.      traMADol 50 MG tablet   Commonly known as: ULTRAM   Take 50 mg by mouth 2 (two) times daily as needed.      TYLENOL 500 MG tablet   Generic drug: acetaminophen   Take 500 mg by mouth every 6 (six) hours as needed. FOR PAIN      vitamin A 10000 UNIT capsule   Take 10,000 Units by mouth daily.      vitamin C 500 MG tablet   Commonly known as: ASCORBIC ACID   Take 500 mg by mouth daily.      Vitamin D3 1000 UNITS Caps   Take 1 capsule by mouth daily.      vitamin E 400 UNIT capsule   Take 400 Units by mouth daily.      WOMENS MULTI VITAMIN & MINERAL PO   Take 1 tablet by mouth daily.      zolpidem  10 MG tablet   Commonly known as: AMBIEN   Take 10 mg by mouth at bedtime as needed. FOR SLEEP            Discharge Orders    Future Orders Please Complete By Expires   Diet - low sodium heart healthy      Activity as tolerated - No restrictions         Follow-up Information    Follow up with Eulis Foster, OTR .       Dr. [name] 2-3 weeks  Disposition: Home or Self Care  Discharged Condition: stable  Consults: Treatment Team:  Rogene Houston, MD  Labs:   Results for orders placed during the hospital encounter of 04/30/11 (from the past 48 hour(s))  CARDIAC PANEL(CRET KIN+CKTOT+MB+TROPI)     Status: Normal   Collection Time   05/03/11 11:02 AM      Component Value Range Comment   Total CK 68  7 - 177 (U/L)    CK, MB 2.2  0.3 - 4.0 (ng/mL)    Troponin I <0.30  <0.30 (ng/mL)    Relative Index RELATIVE INDEX IS INVALID  0.0 - 2.5    TSH     Status: Normal   Collection Time   05/03/11 11:02 AM      Component Value Range Comment   TSH 0.867  0.350 - 4.500 (uIU/mL)   T4, FREE     Status: Normal   Collection Time   05/03/11 11:02 AM      Component Value Range Comment   Free T4 1.12  0.80 - 1.80 (ng/dL)   LIPASE, BLOOD     Status: Normal   Collection Time   05/03/11  11:02 AM      Component Value Range Comment   Lipase 27  11 - 59 (U/L)   GLUCOSE, CAPILLARY     Status: Abnormal   Collection Time   05/03/11 11:37 AM      Component Value Range Comment   Glucose-Capillary 149 (*) 70 - 99 (mg/dL)    Comment 1 Notify RN      Comment 2 Documented in Chart     GLUCOSE, CAPILLARY     Status: Abnormal   Collection Time   05/03/11  4:33 PM      Component Value Range Comment   Glucose-Capillary 173 (*) 70 - 99 (mg/dL)    Comment 1 Notify RN      Comment 2 Documented in Chart     GLUCOSE, CAPILLARY     Status: Abnormal   Collection Time   05/03/11  9:10 PM      Component Value Range Comment   Glucose-Capillary 120 (*) 70 - 99 (mg/dL)    Comment 1 Notify RN     CBC     Status: Abnormal   Collection Time   05/04/11  5:12 AM      Component Value Range Comment   WBC 8.3  4.0 - 10.5 (K/uL)    RBC 3.41 (*) 3.87 - 5.11 (MIL/uL)    Hemoglobin 9.7 (*) 12.0 - 15.0 (g/dL)    HCT 30.0 (*) 36.0 - 46.0 (%)    MCV 88.0  78.0 - 100.0 (fL)    MCH 28.4  26.0 - 34.0 (pg)    MCHC 32.3  30.0 - 36.0 (g/dL)    RDW 15.2  11.5 - 15.5 (%)    Platelets 319  150 - 400 (K/uL)   BASIC METABOLIC PANEL  Status: Abnormal   Collection Time   05/04/11  5:12 AM      Component Value Range Comment   Sodium 140  135 - 145 (mEq/L)    Potassium 3.2 (*) 3.5 - 5.1 (mEq/L)    Chloride 105  96 - 112 (mEq/L)    CO2 30  19 - 32 (mEq/L)    Glucose, Bld 88  70 - 99 (mg/dL)    BUN 9  6 - 23 (mg/dL)    Creatinine, Ser 1.07  0.50 - 1.10 (mg/dL)    Calcium 8.5  8.4 - 10.5 (mg/dL)    GFR calc non Af Amer 52 (*) >90 (mL/min)    GFR calc Af Amer 60 (*) >90 (mL/min)   GLUCOSE, CAPILLARY     Status: Normal   Collection Time   05/04/11  7:11 AM      Component Value Range Comment   Glucose-Capillary 86  70 - 99 (mg/dL)    Comment 1 Notify RN     GLUCOSE, CAPILLARY     Status: Abnormal   Collection Time   05/04/11 11:18 AM      Component Value Range Comment   Glucose-Capillary 100 (*) 70 - 99  (mg/dL)    Comment 1 Notify RN     GLUCOSE, CAPILLARY     Status: Abnormal   Collection Time   05/04/11  4:37 PM      Component Value Range Comment   Glucose-Capillary 206 (*) 70 - 99 (mg/dL)    Comment 1 Notify RN      Comment 2 Documented in Chart     GLUCOSE, CAPILLARY     Status: Abnormal   Collection Time   05/04/11  8:28 PM      Component Value Range Comment   Glucose-Capillary 150 (*) 70 - 99 (mg/dL)    Comment 1 Notify RN      Comment 2 Documented in Chart     GLUCOSE, CAPILLARY     Status: Abnormal   Collection Time   05/05/11  8:06 AM      Component Value Range Comment   Glucose-Capillary 116 (*) 70 - 99 (mg/dL)     Diagnostics:  Dg Chest 1 View  04/30/2011  *RADIOLOGY REPORT*  Clinical Data: Cough, nausea  CHEST - 1 VIEW  Comparison: 03/15/2008  Findings: Mild cardiomegaly.  Linear scarring or atelectasis at the left lung base.  Right lung clear.  No effusion.  Regional bones unremarkable.  IMPRESSION:  1.  Stable linear scarring or atelectasis at the left lung base. 2.  Stable mild cardiomegaly.  Original Report Authenticated By: Trecia Rogers, M.D.   Dg Chest 2 View  05/03/2011  *RADIOLOGY REPORT*  Clinical Data: Chest pain, epigastric pain  CHEST - 2 VIEW  Comparison: 04/30/2011  Findings: Cardiomegaly again noted.  No acute infiltrate or pulmonary edema.  Mild elevation of the right hemidiaphragm.  Mild basilar atelectasis.  Degenerative changes mid thoracic spine.  IMPRESSION: No active disease.  No significant change.  Cardiomegaly again noted.  Original Report Authenticated By: Lahoma Crocker, M.D.   Ct Abdomen Pelvis W Contrast  04/30/2011  *RADIOLOGY REPORT*  Clinical Data: 71 year old female with weakness, nausea, fever, abdominal pain.  History of ulcerative colitis.  CT ABDOMEN AND PELVIS WITH CONTRAST  Technique:  Multidetector CT imaging of the abdomen and pelvis was performed following the standard protocol during bolus administration of intravenous contrast.   Contrast:  100 ml Omnipaque-300.  Comparison: 03/06/2010.  Findings:  Minor dependent atelectasis.  No pericardial or pleural effusion.  Osteopenia. No acute osseous abnormality identified.  No pelvic free fluid.  Diffuse colonic wall thickening extends from the splenic flexure distally.  Superimposed diverticulosis of the sigmoid colon is noted with adjacent mesenteric stranding.  No extraluminal gas or fluid in this region.  The transverse colon has a more normal appearance.  There is diverticulosis at the hepatic flexure.  No inflammation of the right colon.  Negative terminal ileum.  No dilated small bowel. Stomach and duodenum within normal limits.  Gallbladder surgically absent.  Stable liver, spleen, pancreas, adrenal glands, and kidneys.  Portal venous system and major arterial structures are patent.  No abdominal free fluid or pneumoperitoneum.  IMPRESSION: 1.  Large bowel wall thickening throughout the entire left colon and rectosigmoid colon may reflect the history of inflammatory bowel disease.  Evidence of superimposed acute diverticulitis of the sigmoid colon without complicating features. 2.  No other acute findings.  Original Report Authenticated By: Randall An, M.D.    Full Code   Hospital Course: See H&P for complete admission details. The patient is a 71 year old white female who presented with abdominal pain and worsening of her chronic diarrhea. She has a history of ulcerative colitis and has reportedly been in tolerant of many medications. The patient also had complaints of sinus pain and congestion and headache. She was admitted to the hospitalist service and started on supportive care and IV steroids and antibiotics. GI was consulted and followed along. The patient requested antibiotics be stopped as she thought it was worsening her diarrhea. As she was improving quickly this was reasonable. Her stool was negative for Clostridium difficile PCR. By the time of discharge, her stools  were at baseline frequency. She was tolerating a diet and now had no abdominal pain. During the hospitalization, she also had chest pain which was likely GI in origin. EKG showed no change.  She was treated for sinusitis as well with saline and Afrin. She reported sinus pressure in the frontal sinus and maxillary sinus area for several weeks. She declined antibiotics.  Her blood pressure was elevated while here and this was improved with additional antihypertensives. However, she refuses any additional medications at discharge, citing normal blood pressures at home and in followup visits. It's possible that her blood pressure readings have been elevated due to pain stress, IV fluids and steroids, but she will need to have this repeated as an outpatient. She voices understanding.  Discharge Exam:  Blood pressure 165/75, pulse 72, temperature 98.2 F (36.8 C), temperature source Oral, resp. rate 20, height 5' 8.5" (1.74 m), weight 65.3 kg (143 lb 15.4 oz), SpO2 97.00%.  Exam unchanged from 05/04/11  Signed: Doree Barthel L 05/05/2011, 9:57 AM

## 2011-05-05 NOTE — Progress Notes (Signed)
CARE MANAGEMENT NOTE 05/05/2011  Patient:  [name], [name]   Account Number:  [id number]  Date Initiated:  05/01/2011  Documentation initiated by:  Claretha Cooper  Subjective/Objective Assessment:   Pt admitted with aabdominal pain. PTA lived at home alone independent with ADL.     Action/Plan:   Spoke w/ pt at bedside. No HH needs identified at this time. CM to follow.   Anticipated DC Date:  05/05/2011   Anticipated DC Plan:  [place]  CM consult      Choice offered to / List presented to:             Status of service:  Completed, signed off Medicare Important Message given?   (If response is "NO", the following Medicare IM given date fields will be blank) Date Medicare IM given:   Date Additional Medicare IM given:    Discharge Disposition:  HOME/SELF CARE  Per UR Regulation:    Comments:  05/05/11 [place] BSN CM  05/01/11 1330 [place] BSN CM IF HH needs identified, pt would like to use [facility]

## 2011-05-06 LAB — STOOL CULTURE

## 2011-05-26 ENCOUNTER — Ambulatory Visit (INDEPENDENT_AMBULATORY_CARE_PROVIDER_SITE_OTHER): Payer: Medicare PPO | Admitting: Internal Medicine

## 2011-06-07 ENCOUNTER — Emergency Department (HOSPITAL_COMMUNITY)
Admission: EM | Admit: 2011-06-07 | Discharge: 2011-06-07 | Disposition: A | Discharge status: Home / Self Care | Payer: Medicare PPO | Attending: Emergency Medicine | Admitting: Emergency Medicine

## 2011-06-07 ENCOUNTER — Encounter (HOSPITAL_COMMUNITY): Payer: Self-pay | Admitting: *Deleted

## 2011-06-07 DIAGNOSIS — R197 Diarrhea, unspecified: Secondary | ICD-10-CM | POA: Insufficient documentation

## 2011-06-07 DIAGNOSIS — I10 Essential (primary) hypertension: Secondary | ICD-10-CM | POA: Insufficient documentation

## 2011-06-07 DIAGNOSIS — Z862 Personal history of diseases of the blood and blood-forming organs and certain disorders involving the immune mechanism: Secondary | ICD-10-CM | POA: Insufficient documentation

## 2011-06-07 DIAGNOSIS — Z9889 Other specified postprocedural states: Secondary | ICD-10-CM | POA: Insufficient documentation

## 2011-06-07 DIAGNOSIS — IMO0001 Reserved for inherently not codable concepts without codable children: Secondary | ICD-10-CM | POA: Insufficient documentation

## 2011-06-07 DIAGNOSIS — Z9079 Acquired absence of other genital organ(s): Secondary | ICD-10-CM | POA: Insufficient documentation

## 2011-06-07 DIAGNOSIS — K5190 Ulcerative colitis, unspecified, without complications: Secondary | ICD-10-CM | POA: Insufficient documentation

## 2011-06-07 DIAGNOSIS — Z79899 Other long term (current) drug therapy: Secondary | ICD-10-CM | POA: Insufficient documentation

## 2011-06-07 LAB — DIFFERENTIAL
Eosinophils Absolute: 0 10*3/uL (ref 0.0–0.7)
Eosinophils Relative: 0 % (ref 0–5)
Lymphs Abs: 0.5 10*3/uL — ABNORMAL LOW (ref 0.7–4.0)
Monocytes Absolute: 0.5 10*3/uL (ref 0.1–1.0)

## 2011-06-07 LAB — COMPREHENSIVE METABOLIC PANEL
AST: 25 U/L (ref 0–37)
Albumin: 3.5 g/dL (ref 3.5–5.2)
Alkaline Phosphatase: 73 U/L (ref 39–117)
Chloride: 98 mEq/L (ref 96–112)
Potassium: 4 mEq/L (ref 3.5–5.1)
Total Bilirubin: 1 mg/dL (ref 0.3–1.2)

## 2011-06-07 LAB — CLOSTRIDIUM DIFFICILE BY PCR: Toxigenic C. Difficile by PCR: NEGATIVE

## 2011-06-07 LAB — CBC
MCH: 29.5 pg (ref 26.0–34.0)
MCV: 89.2 fL (ref 78.0–100.0)
Platelets: 325 10*3/uL (ref 150–400)
RBC: 4.54 MIL/uL (ref 3.87–5.11)

## 2011-06-07 MED ORDER — SODIUM CHLORIDE 0.9 % IV BOLUS (SEPSIS): 1000.0000 mL | Freq: Once | INTRAVENOUS | Status: DC

## 2011-06-07 MED ORDER — PREDNISONE 10 MG PO TABS: ORAL_TABLET | ORAL | Status: DC

## 2011-06-07 MED ORDER — ALPRAZOLAM 0.5 MG PO TABS
0.5000 mg | ORAL_TABLET | Freq: Once | ORAL | Status: AC
  Administered 2011-06-07: 0.5 mg via ORAL
  Filled 2011-06-07: qty 1

## 2011-06-07 MED ORDER — PREDNISONE 20 MG PO TABS
40.0000 mg | ORAL_TABLET | Freq: Once | ORAL | Status: AC
  Administered 2011-06-07: 40 mg via ORAL
  Filled 2011-06-07: qty 2

## 2011-06-07 MED ORDER — SODIUM CHLORIDE 0.9 % IV BOLUS (SEPSIS)
1000.0000 mL | Freq: Once | INTRAVENOUS | Status: AC
  Administered 2011-06-07: 1000 mL via INTRAVENOUS

## 2011-06-07 MED ORDER — FENTANYL CITRATE 0.05 MG/ML IJ SOLN
100.0000 ug | Freq: Once | INTRAMUSCULAR | Status: AC
  Administered 2011-06-07: 100 ug via INTRAVENOUS
  Filled 2011-06-07: qty 2

## 2011-06-07 MED ORDER — HYDROCODONE-ACETAMINOPHEN 5-500 MG PO CAPS: 1.0000 | ORAL_CAPSULE | Freq: Four times a day (QID) | ORAL | Status: AC | PRN

## 2011-06-07 MED ORDER — ONDANSETRON HCL 4 MG/2ML IJ SOLN
4.0000 mg | Freq: Once | INTRAMUSCULAR | Status: AC
  Administered 2011-06-07: 4 mg via INTRAVENOUS
  Filled 2011-06-07: qty 2

## 2011-06-07 NOTE — ED Notes (Signed)
Patient had loose bowel movement on the way here. Assisted patient to get clean and to use [facility]. Patient instructed to use call bell to use [facility],patient states understanding.

## 2011-06-07 NOTE — Discharge Instructions (Signed)
Ulcerative Colitis Ulcerative colitis is a long lasting swelling and soreness (inflammation) of the colon (large intestine). In patients with ulcerative colitis, sores (ulcers) and inflammation of the inner lining of the colon lead to illness. Ulcerative colitis can also cause problems outside the digestive tract.  Ulcerative colitis is closely related to another condition of inflammation of the intestines called Crohn's disease. Together, they are frequently referred to as inflammatory bowel disease (IBD). Ulcerative colitis and Crohn's diseases are conditions that can last years to decades. Men and women are affected equally. They most commonly begin during adolescence and early adulthood. SYMPTOMS  Common symptoms of ulcerative colitis include rectal bleeding and diarrhea. There is a wide range of symptoms among patients with this disease depending on how severe the disease is. Some of these symptoms are:  Abdominal pain or cramping.   Diarrhea.   Fever.   Tiredness (fatigue).   Weight loss.   Night sweats.   Rectal pain.   Feeling the immediate need to have a bowel movement (rectal urgency).  CAUSES  Ulcerative colitis is caused by increased activity of the immune system in the intestines. The immune system is the system that protects the body against disease such as harmful bacteria, viruses, fungi, and other foreign invaders. When the immune system overacts, it causes inflammation. The cause of the increased immune system activity is not known. This over activity causes long-lasting inflammation and ulceration. This condition may be passed down from your parents (inherited). Brothers, sisters, children, and parents of patients with IBD are more likely to develop these diseases. It is not contagious. This means you cannot catch it from someone else. DIAGNOSIS  Your caregiver may suspect ulcerative colitis based on your symptoms and exam. Blood tests may confirm that there is a problem.  You may be asked to submit a stool specimen for examination. X-rays and CT scans may be necessary. Ultimately, the diagnosis is usually made after a flexible tube is inserted via your anus and your colon is examined under sedation (colonoscopy). With this test, the specialist can take a tiny tissue sample from inside the bowel (biopsy). Examination of this biopsy tissue under a microscopy can reveal ulcerative colitis as the cause of your symptoms. TREATMENT   There is no cure for ulcerative colitis.   Complications such as massive bleeding from the colon (hemorrhage), development of a hole in the colon (perforation), or the development of precancerous or cancerous changes of the colon may require surgery.   Medications are often used to decrease inflammation and control the immune system. These include medicines related to aspirin, steroid medications, and newer and stronger medications to slow down the immune system. Some medications may be used as suppositories or enemas. A number of other medications are used or have been studied. Your caregiver will make specific recommendations.  HOME CARE INSTRUCTIONS   There is no cure for ulcerative colitis disease. The best treatment is frequent checkups with your caregiver. Periodic reevaluation is important.   Symptoms such as diarrhea can be controlled with medications. Avoid foods that have a laxative effect such fresh fruit and vegetables and dairy products. During flare ups, you can rest your bowel by staying away from solid foods. Drink clear liquids frequently during the day. Electrolyte or rehydrating fluids are best. Your caregiver can help you with suggestions. Drink often to prevent dehydration. When diarrhea has cleared, eat smaller meals and more often. Avoid food additives and stimulants such as caffeine (coffee, tea, many sodas, or chocolate).  Avoid dairy products. Enzyme supplements may help if you develop intolerance to a sugar in dairy  products (lactose). Ask your caregiver or dietitian about specific dietary instructions.   If you had surgery, be sure you understand your care instructions thoroughly, including proper care of any surgical wounds.   Take any medications exactly as prescribed.   Try to maintain a positive attitude. Learn relaxation techniques such as self hypnosis, mental imaging, and muscle relaxation. If possible, avoid stresses that aggravate your condition. Exercise regularly. Follow your diet. Always get plenty of rest.  SEEK MEDICAL CARE IF:   Your symptoms fail to improve after a week or two of new treatment.   You experience continued weight loss.   You have ongoing crampy digestion or loose bowels.   You develop a new skin rash, skin sores, or eye problems.  SEEK IMMEDIATE MEDICAL CARE IF:   You have worsening of your symptoms or develop new symptoms.   You have an oral temperature above 102 F (38.9 C), not controlled by medicine.   You develop bloody diarrhea.   You have severe abdominal pain.  Document Released: 12/17/2004 Document Revised: 02/26/2011 Document Reviewed: 11/16/2006 [employer] Patient Information 2012 [place].

## 2011-06-07 NOTE — ED Provider Notes (Signed)
History     CSN: [id number]  Arrival date & time 06/07/11  6720   First MD Initiated Contact with Patient 06/07/11 1955      Chief Complaint  Patient presents with  . Diarrhea  . Nausea    (Consider location/radiation/quality/duration/timing/severity/associated sxs/prior treatment) HPI The patient is a 71 yo woman, history of ulcerative colitis x6 yrs, chronic diarrhea, and fibromyalgia, presenting with nausea, vomiting and diarrhea.  A few hours prior admission, the patient was eating pre-packaged chicken and rice soup, and subsequently started experiencing frequent watery non-bloody Joanne Brander bowel movements every 5 minutes, with 20 bowel movements total today.  The patient has chronic diarrhea, with baseline 5 BM/day, but notes that today's bowel movements were more frequent, and with a different odor and consistency.  The patient also experienced 2 episodes of vomiting clear liquid this evening.  She also notes an increase in her chronic abdominal pain, described as a dull lower abdominal pain, 8/10 in severity.  At home, the patient only taking Prednisone currently for her UC, currently taking 10 mg/day, tapered down from 40 mg/day after her last hospitalization.  The patient notes no fevers, sick contacts, cp, SOB.  Past Medical History  Diagnosis Date  . Hypertension   . Ulcerative colitis   . Fibromyalgia   . Ulcerative colitis   . Rectal bleeding   . Diarrhea   . Hyperglycemia, drug-induced 05/02/2011  . Anemia due to blood loss 05/02/2011    Past Surgical History  Procedure Date  . Cholecystectomy     s/p  . Colonoscopy 06/16/06. 04/27/2007    proctitis seen, biopsies showed chronic active colitis, no dysplasia   . Abdominal hysterectomy     No family history on file.  History  Substance Use Topics  . Smoking status: Never Smoker   . Smokeless tobacco: Not on file  . Alcohol Use: Yes     Ocassionally a small glass of wine. Patient states that is has been 2 months  since she drank anything.    OB History    Grav Para Term Preterm Abortions TAB SAB Ect Mult Living                  Review of Systems General: no fevers, chills, changes in weight, changes in appetite Skin: no rash HEENT: no blurry vision, hearing changes, sore throat Pulm: no dyspnea, coughing, wheezing CV: no chest pain, palpitations, shortness of breath Abd: see HPI GU: no dysuria, hematuria, polyuria Ext: no arthralgias, myalgias Neuro: no weakness, numbness, or tingling   Allergies  Azathioprine; Tape; Codeine; Novocain; Penicillins; Povidone-iodine; Sulfonamide derivatives; Dairy aid; Latex; and Niacin and related  Home Medications   Current Outpatient Rx  Name Route Sig Dispense Refill  . ACETAMINOPHEN 500 MG PO TABS Oral Take 500 mg by mouth every 6 (six) hours as needed. FOR PAIN    . ALPRAZOLAM 0.5 MG PO TABS Oral Take 0.5 mg by mouth 2 (two) times daily as needed. FOR ANXIETY     . BIOTENE DRY MOUTH MT LIQD Mouth Rinse 1 application by Mouth Rinse route as needed. FOR DRY MOUTH     . B COMPLEX PO CAPS Oral Take 1 capsule by mouth daily.      Marland Kitchen BENAZEPRIL-HYDROCHLOROTHIAZIDE 20-12.5 MG PO TABS Oral Take 1 tablet by mouth daily.      Marland Kitchen VITAMIN D3 1000 UNITS PO CAPS Oral Take 1 capsule by mouth daily.      Marland Kitchen CO-ENZYME Q-10 50 MG PO CAPS  Oral Take 50 mg by mouth daily.      Marland Kitchen ESCITALOPRAM OXALATE 20 MG PO TABS Oral Take 20 mg by mouth at bedtime.      Marland Kitchen HYDROCODONE-ACETAMINOPHEN 5-500 MG PO TABS Oral Take 1 tablet by mouth every 6 (six) hours as needed. For pain    . WOMENS MULTI VITAMIN & MINERAL PO Oral Take 1 tablet by mouth daily.      Marland Kitchen FISH OIL 1000 MG PO CPDR Oral Take 1 capsule by mouth daily.      Marland Kitchen ONDANSETRON HCL 4 MG PO TABS Oral Take 4 mg by mouth as needed. For nausea    . SENSI-CARE PROTECTIVE BARRIER EX Apply externally Apply 1 application topically daily.      Marland Kitchen PROBIOTIC FORMULA PO Oral Take 1-2 capsules by mouth daily.      Marland Kitchen SALINE 0.65 % NA SOLN  Nasal Place 2 sprays into the nose as needed for congestion.    . TRAMADOL HCL 50 MG PO TABS Oral Take 50 mg by mouth 2 (two) times daily as needed.      Marland Kitchen VITAMIN A 96222 UNITS PO CAPS Oral Take 10,000 Units by mouth daily.      Marland Kitchen VITAMIN C 500 MG PO TABS Oral Take 500 mg by mouth daily.      Marland Kitchen VITAMIN E 400 UNITS PO CAPS Oral Take 400 Units by mouth daily.      Marland Kitchen ZOLPIDEM TARTRATE 10 MG PO TABS Oral Take 10 mg by mouth at bedtime as needed. FOR SLEEP       BP 124/58  Pulse 102  Temp(Src) 98.3 F (36.8 C) (Oral)  Resp 22  SpO2 100%  Physical Exam General: alert, cooperative, and in no apparent distress HEENT: pupils equal round and reactive to light, vision grossly intact, oropharynx clear and non-erythematous, mucous membranes dry Neck: supple, no lymphadenopathy, JVD, or carotid bruits Lungs: clear to ascultation bilaterally, normal work of respiration, no wheezes, rales, ronchi Heart: regular rate and rhythm, no murmurs, gallops, or rubs Abdomen: soft, moderately tender to palpation of LLQ and RLQ, no distention, no guarding, no rebound tenderness Extremities: no cyanosis, clubbing, or edema Neurologic: alert & oriented X3, cranial nerves II-XII intact, strength grossly intact, sensation intact to light touch   ED Course  Procedures (including critical care time)   Labs Reviewed  COMPREHENSIVE METABOLIC PANEL  CBC  DIFFERENTIAL  CLOSTRIDIUM DIFFICILE BY PCR   No results found.   No diagnosis found.    MDM  The patient is a 71 yo woman, history of ulcerative colitis x6 yrs, chronic diarrhea, and fibromyalgia, presenting with nausea, vomiting and diarrhea.  # Diarrhea - the patient has a history of chronic diarrhea and UC, presenting with increased frequency of diarrhea, likely representing UC flare vs c diff vs viral gastroenteritis vs bacterial gastroenteritis. -cbc, cmp -c diff pcr -1 L fluid bolus -zofran for nausea, fentanyl for pain -diarrhea has now  subsided, will consider ibuprofen vs increased prednisone depending on symptom course  # Dispo - can likely d/c home if diarrhea continues to subside; otherwise may need admission for UC flare.  Signed, Elnora Morrison, PGY1 06/07/2011, 9:22 PM  11:13 pm - diarrhea has slowed down, with only 2-3 BM's since admission.  Patient feels better after 1 L IVF.  C diff negative.  Patient's symptoms likely represent UC flare.  Gave prednisone 40, will plan for discharge with slow taper.  Patient to be discharged home.  Hester Mates, MD 06/07/11 [id number]

## 2011-06-07 NOTE — ED Notes (Signed)
Patient on bedside commode at this time. Ambulated on her own with no difficulty.

## 2011-06-07 NOTE — ED Notes (Signed)
Pt reports n/v/d x 1 day, pt also presents very anxious

## 2011-06-08 NOTE — ED Provider Notes (Signed)
I saw and evaluated the patient, reviewed the resident's note and I agree with the findings and plan.   .Face to face Exam:  General:  Awake HEENT:  Atraumatic Resp:  Normal effort Abd:  Nondistended Neuro:No focal weakness Lymph: No adenopathy   Dot Lanes, MD 06/08/11 1037

## 2011-06-22 ENCOUNTER — Ambulatory Visit (INDEPENDENT_AMBULATORY_CARE_PROVIDER_SITE_OTHER): Payer: Medicare PPO | Admitting: Internal Medicine

## 2011-07-21 ENCOUNTER — Ambulatory Visit (INDEPENDENT_AMBULATORY_CARE_PROVIDER_SITE_OTHER): Payer: Medicare PPO | Admitting: Internal Medicine

## 2011-08-25 ENCOUNTER — Ambulatory Visit (INDEPENDENT_AMBULATORY_CARE_PROVIDER_SITE_OTHER): Payer: Medicare PPO | Admitting: Internal Medicine

## 2011-09-09 ENCOUNTER — Ambulatory Visit (INDEPENDENT_AMBULATORY_CARE_PROVIDER_SITE_OTHER): Payer: Medicare PPO | Admitting: Internal Medicine

## 2011-09-18 ENCOUNTER — Encounter (INDEPENDENT_AMBULATORY_CARE_PROVIDER_SITE_OTHER): Payer: Self-pay

## 2011-09-22 ENCOUNTER — Encounter (INDEPENDENT_AMBULATORY_CARE_PROVIDER_SITE_OTHER): Payer: Self-pay | Admitting: Internal Medicine

## 2011-09-22 ENCOUNTER — Ambulatory Visit (INDEPENDENT_AMBULATORY_CARE_PROVIDER_SITE_OTHER): Payer: Medicare PPO | Admitting: Internal Medicine

## 2011-09-22 VITALS — BP 120/64 | HR 66 | Temp 98.1°F | Ht 68.5 in | Wt 143.8 lb

## 2011-09-22 DIAGNOSIS — K5120 Ulcerative (chronic) proctitis without complications: Secondary | ICD-10-CM

## 2011-09-22 MED ORDER — ONDANSETRON HCL 4 MG PO TABS: 4.0000 mg | ORAL_TABLET | ORAL | Status: DC | PRN

## 2011-09-22 MED ORDER — HYDROCODONE-ACETAMINOPHEN 5-500 MG PO TABS: 1.0000 | ORAL_TABLET | ORAL | Status: AC | PRN

## 2011-09-22 NOTE — Patient Instructions (Addendum)
Continue present medications. Any  UC flares, please call our office.. Will get redfent labs from Dr. Laurance Flatten. OV in 6 months with a CBC.

## 2011-09-22 NOTE — Progress Notes (Signed)
Subjective:     Patient ID: [name], female   DOB: 01/08/1941, 71 y.o.   MRN: [id number]  HPI [name] is a 71 yr old female here today for f/u of her UC. She just got back to [place].  In March she was at [facility] for flare of UC.  She was seen in the ED and given an Rx for a Prednisone taper. She has finished this now.  She says she is doing pretty good. She has not had any rectal bleeding lately. She has a hx of refractory distal ulcerative colitis. She feels 75% better. She has a BM 5 times a day.  She has in the past been able to hold the Canasa suppositories. Appetite is good. No weight loss. She has actually gained some weight.   08/28/11 H and H 11.8 and 37.6, MCV 93.   CBC    Component Value Date/Time   WBC 15.2* 06/07/2011 2029   RBC 4.54 06/07/2011 2029   HGB 13.4 06/07/2011 2029   HCT 40.5 06/07/2011 2029   PLT 325 06/07/2011 2029   MCV 89.2 06/07/2011 2029   MCH 29.5 06/07/2011 2029   MCHC 33.1 06/07/2011 2029   RDW 14.9 06/07/2011 2029   LYMPHSABS 0.5* 06/07/2011 2029   MONOABS 0.5 06/07/2011 2029   EOSABS 0.0 06/07/2011 2029   BASOSABS 0.0 06/07/2011 2029   CMP     Component Value Date/Time   NA 136 06/07/2011 2029   K 4.0 06/07/2011 2029   CL 98 06/07/2011 2029   CO2 23 06/07/2011 2029   GLUCOSE 205* 06/07/2011 2029   BUN 22 06/07/2011 2029   CREATININE 1.37* 06/07/2011 2029   CALCIUM 9.7 06/07/2011 2029   PROT 6.9 06/07/2011 2029   ALBUMIN 3.5 06/07/2011 2029   AST 25 06/07/2011 2029   ALT 16 06/07/2011 2029   ALKPHOS 73 06/07/2011 2029   BILITOT 1.0 06/07/2011 2029   GFRNONAA 38* 06/07/2011 2029   GFRAA 44* 06/07/2011 2029        Review of Systems see hpi Current Outpatient Prescriptions  Medication Sig Dispense Refill  . acetaminophen (TYLENOL) 500 MG tablet Take 500 mg by mouth every 6 (six) hours as needed. FOR PAIN      . ALPRAZolam (XANAX) 0.5 MG tablet Take 0.5 mg by mouth 2 (two) times daily as needed. FOR ANXIETY       . B Complex CAPS Take 1 capsule by mouth  daily.        . benazepril-hydrochlorthiazide (LOTENSIN HCT) 20-12.5 MG per tablet Take 1 tablet by mouth daily.        . Cholecalciferol (VITAMIN D3) 1000 UNITS CAPS Take 1 capsule by mouth daily.        Marland Kitchen co-enzyme Q-10 50 MG capsule Take 50 mg by mouth daily.        Marland Kitchen escitalopram (LEXAPRO) 20 MG tablet Take 20 mg by mouth at bedtime.        Marland Kitchen HYDROcodone-acetaminophen (VICODIN) 5-500 MG per tablet Take 1 tablet by mouth every 6 (six) hours as needed. For pain      . Multiple Vitamins-Minerals (WOMENS MULTI VITAMIN & MINERAL PO) Take 1 tablet by mouth daily.        . Omega-3 Fatty Acids (FISH OIL) 1000 MG CPDR Take 1 capsule by mouth daily.        . ondansetron (ZOFRAN) 4 MG tablet Take 4 mg by mouth as needed. For nausea      . Petrolatum-Zinc  Oxide (SENSI-CARE PROTECTIVE BARRIER EX) Apply 1 application topically daily.        . Probiotic Product (PROBIOTIC FORMULA PO) Take 1-2 capsules by mouth daily.        . traMADol (ULTRAM) 50 MG tablet Take 50 mg by mouth 2 (two) times daily as needed. pain      . vitamin A 10000 UNIT capsule Take 10,000 Units by mouth daily.        . vitamin C (ASCORBIC ACID) 500 MG tablet Take 500 mg by mouth daily.        . vitamin E 400 UNIT capsule Take 400 Units by mouth daily.        Marland Kitchen zolpidem (AMBIEN) 10 MG tablet Take 10 mg by mouth at bedtime as needed. FOR SLEEP       . antiseptic oral rinse (BIOTENE) LIQD 1 application by Mouth Rinse route as needed. FOR DRY MOUTH       . predniSONE (DELTASONE) 10 MG tablet Take 40 mg daily for 1 week, then 30 mg daily for 1 week, then 20 mg daily for 1 week, then take 10 mg daily until seen by physician  70 tablet  1  . DISCONTD: sodium chloride (OCEAN) 0.65 % SOLN nasal spray Place 2 sprays into the nose as needed for congestion.       Past Medical History  Diagnosis Date  . Hypertension   . Ulcerative colitis   . Fibromyalgia   . Ulcerative colitis   . Rectal bleeding   . Diarrhea   . Hyperglycemia, drug-induced  05/02/2011  . Anemia due to blood loss 05/02/2011   Past Surgical History  Procedure Date  . Cholecystectomy     s/p  . Colonoscopy 06/16/06. 04/27/2007    proctitis seen, biopsies showed chronic active colitis, no dysplasia   . Abdominal hysterectomy    History   Social History  . Marital Status: Widowed    Spouse Name: N/A    Number of Children: N/A  . Years of Education: N/A   Occupational History  . Not on file.   Social History Main Topics  . Smoking status: Never Smoker   . Smokeless tobacco: Not on file  . Alcohol Use: Yes     Ocassionally a small glass of wine. Patient states that is has been 2 months since she drank anything.  . Drug Use: No  . Sexually Active: No   Other Topics Concern  . Not on file   Social History Narrative  . No narrative on file   History reviewed. No pertinent family history.' Family Status  Relation Status Death Age  . Mother Deceased     MI  . Father Deceased     MI  . Brother Deceased     CABG   Allergies  Allergen Reactions  . Azathioprine Nausea And Vomiting and Other (See Comments)    SEVERE NAUSEA AND VOMITING WITH CHEST PAIN-  PATIENT INSISTS NEVER TO BE GIVEN ANYTHING SIMILAR TO THIS  . Tape Other (See Comments)    BLISTERS AND SKIN TEARING  . Codeine Nausea And Vomiting  . Penicillins Hives  . Povidone Hives    BETADINE  . Procaine Hcl Other (See Comments)    SEVERE GI UPSET (NAUSEA,VOMITING AND DIARRHEA)  . Sulfonamide Derivatives Hives  . Dairy Aid (Lactase)   . Latex Rash  . Niacin And Related Rash       Objective:   Physical Exam Filed Vitals:   09/22/11 1513  Height: 5' 8.5" (1.74 m)  Weight: 143 lb 12.8 oz (65.227 kg)  Alert and oriented. Skin warm and dry. Oral mucosa is moist.   . Sclera anicteric, conjunctivae is pink. Thyroid not enlarged. No cervical lymphadenopathy. Lungs clear. Heart regular rate and rhythm.  Abdomen is soft. Bowel sounds are positive. No hepatomegaly. No abdominal masses felt.  No tenderness.  No edema to lower extremities. Patient is alert and oriented.       Assessment:    Hx of UC. She says she is 75% better since her ED visit in March. Labs normal    Plan:    OV in 6 months with a CBC. Redent labs from Dr. Laurance Flatten Copy to Dr. Laurance Flatten.

## 2011-09-23 ENCOUNTER — Telehealth (INDEPENDENT_AMBULATORY_CARE_PROVIDER_SITE_OTHER): Payer: Self-pay | Admitting: *Deleted

## 2011-09-23 DIAGNOSIS — K5190 Ulcerative colitis, unspecified, without complications: Secondary | ICD-10-CM

## 2011-09-23 NOTE — Telephone Encounter (Signed)
Per Deberah Castle NP at the time of office visit on 09/22/11 , the patient will need to have CBC/D and office visit in 6 months.

## 2011-10-15 ENCOUNTER — Encounter (INDEPENDENT_AMBULATORY_CARE_PROVIDER_SITE_OTHER): Payer: Self-pay

## 2011-10-28 ENCOUNTER — Telehealth (INDEPENDENT_AMBULATORY_CARE_PROVIDER_SITE_OTHER): Payer: Self-pay | Admitting: Internal Medicine

## 2011-10-28 DIAGNOSIS — R197 Diarrhea, unspecified: Secondary | ICD-10-CM

## 2011-10-28 NOTE — Telephone Encounter (Signed)
She had a virus x 3 weeks ago. Then she says she had another virus 1 week ago. Stools x 15 stools a day. Sometimes she has 10 stools a day.  Will get stool  Studies on her.  Hx of UC. PRednisone taper

## 2011-11-09 ENCOUNTER — Telehealth (INDEPENDENT_AMBULATORY_CARE_PROVIDER_SITE_OTHER): Payer: Self-pay | Admitting: Internal Medicine

## 2011-11-09 NOTE — Telephone Encounter (Signed)
Results of stools studies given to patient. She fill much better.

## 2011-11-27 ENCOUNTER — Encounter (INDEPENDENT_AMBULATORY_CARE_PROVIDER_SITE_OTHER): Payer: Self-pay

## 2011-12-07 ENCOUNTER — Other Ambulatory Visit (INDEPENDENT_AMBULATORY_CARE_PROVIDER_SITE_OTHER): Payer: Self-pay | Admitting: Internal Medicine

## 2011-12-08 NOTE — Telephone Encounter (Signed)
This needs to go to primary care.

## 2012-02-08 ENCOUNTER — Encounter (INDEPENDENT_AMBULATORY_CARE_PROVIDER_SITE_OTHER): Payer: Self-pay

## 2012-02-09 ENCOUNTER — Telehealth (INDEPENDENT_AMBULATORY_CARE_PROVIDER_SITE_OTHER): Payer: Self-pay | Admitting: *Deleted

## 2012-02-09 NOTE — Telephone Encounter (Signed)
[place] is requesting a rx refill on Prednisone 5 mg, take 8 tablets daily for 1 week then decrease by 1 tablet per week until finished.

## 2012-02-10 ENCOUNTER — Other Ambulatory Visit (INDEPENDENT_AMBULATORY_CARE_PROVIDER_SITE_OTHER): Payer: Self-pay | Admitting: Internal Medicine

## 2012-02-10 NOTE — Telephone Encounter (Signed)
Can call prednisone 10 mg tablets 60; use as directed

## 2012-02-10 NOTE — Telephone Encounter (Signed)
[name] please call patient and find out why she needs prednisone

## 2012-02-10 NOTE — Telephone Encounter (Signed)
Patient states that a few says ago she started with another flare. States that when she was last seen at the office for flare this was what she was given. Says that she has seen a little blood

## 2012-02-10 NOTE — Telephone Encounter (Signed)
I will let Dr. [name] look at this.

## 2012-02-11 NOTE — Telephone Encounter (Signed)
Already addressed

## 2012-02-11 NOTE — Telephone Encounter (Signed)
Per Dr.Rehman may call in the following: Prednisone 10 mg . Patient is to take 30 mg by mouth for 1 week , then decrease to 25 mg for 1 week, 20 mg for 1 week , 15 mg for 1 week, 10 mg for 1 week , 5 mg for 1 week. Dispense the amount needed. Called to [facility] Patient has a office appointment 02-23-12. Patient made aware

## 2012-03-03 ENCOUNTER — Encounter (INDEPENDENT_AMBULATORY_CARE_PROVIDER_SITE_OTHER): Payer: Self-pay | Admitting: *Deleted

## 2012-03-03 ENCOUNTER — Telehealth (INDEPENDENT_AMBULATORY_CARE_PROVIDER_SITE_OTHER): Payer: Self-pay | Admitting: *Deleted

## 2012-03-03 DIAGNOSIS — K5190 Ulcerative colitis, unspecified, without complications: Secondary | ICD-10-CM

## 2012-03-03 NOTE — Telephone Encounter (Signed)
Lab order done

## 2012-03-24 ENCOUNTER — Encounter (INDEPENDENT_AMBULATORY_CARE_PROVIDER_SITE_OTHER): Payer: Self-pay | Admitting: Internal Medicine

## 2012-03-24 ENCOUNTER — Ambulatory Visit (INDEPENDENT_AMBULATORY_CARE_PROVIDER_SITE_OTHER): Payer: MEDICARE | Admitting: Internal Medicine

## 2012-03-24 VITALS — BP 163/78 | HR 84 | Temp 98.2°F | Ht 68.5 in | Wt 159.0 lb

## 2012-03-24 DIAGNOSIS — K5190 Ulcerative colitis, unspecified, without complications: Secondary | ICD-10-CM

## 2012-03-24 MED ORDER — MESALAMINE 1000 MG RE SUPP: 1000.0000 mg | Freq: Every day | RECTAL | Status: DC

## 2012-03-24 NOTE — Patient Instructions (Addendum)
Canasa supp x 30 days. CBC, CRP today. OV in 6 months

## 2012-03-24 NOTE — Progress Notes (Signed)
Subjective:     Patient ID: [name], female   DOB: [birth year], 72 y.o.   MRN: [id number]  HPI Here today for f/u of her UC. She tells me she is doing fair. She takes Hydrocodone for abdominal/leg pain. She also takes Tramadol. She is having 7 loose stools yesterday. She does have a lot of gas in the evening.  She is still passing a small amt of mucous. She has been using some Beano and GasX for the gas. She has rectal pain with the gas. She is passing a small amt of blood.  She stopped Prednisone last week for flare of UC.  She is taking Imodium on a prn basis. She tells me she is intolerant of most the UC medications. She is due for a CBC today.   09/16/2010 Colonoscopy: FINAL DIAGNOSES:  1. Examination performed to cecum.  2. Active disease with transition at 28 cm from anal margin. Multiple  ulcers involving the rectal mucosa and distal sigmoid colon.  3. Scattered diverticula throughout the colon.  4. Small cecal polyp cold biopsied and residual treated with APC.  5. A 10-mm polyp snared from the rectum.  6. Biopsies taken from mucosa of sigmoid colorectum.  Biopsy: Tubular adenoma. No high grade dysplasia or malignancy. Review of Systems     Objective:   Physical Exam Filed Vitals:   03/24/12 1421  BP: 163/78  Pulse: 84  Temp: 98.2 F (36.8 C)  Height: 5' 8.5" (1.74 m)  Weight: 159 lb (72.122 kg)   Alert and oriented. Skin warm and dry. Oral mucosa is moist.   . Sclera anicteric, conjunctivae is pink. Thyroid not enlarged. No cervical lymphadenopathy. Lungs clear. Heart regular rate and rhythm.  Abdomen is soft. Bowel sounds are positive. No hepatomegaly. No abdominal masses felt. No tenderness.  No edema to lower extremities.      Assessment:    UC which is not in remission. Just finished Prednisone last week. She continues to have symptoms. Passing mucous and small amt of blood.    Plan:    Canasa supp. X 30 days. OV 6 months. CBC today

## 2012-03-30 ENCOUNTER — Other Ambulatory Visit (INDEPENDENT_AMBULATORY_CARE_PROVIDER_SITE_OTHER): Payer: Self-pay | Admitting: Internal Medicine

## 2012-06-16 ENCOUNTER — Encounter (INDEPENDENT_AMBULATORY_CARE_PROVIDER_SITE_OTHER): Payer: Self-pay

## 2012-06-16 ENCOUNTER — Encounter: Payer: Self-pay | Admitting: *Deleted

## 2012-07-04 ENCOUNTER — Other Ambulatory Visit: Payer: Self-pay | Admitting: *Deleted

## 2012-07-04 MED ORDER — TRAMADOL HCL 50 MG PO TABS: ORAL_TABLET | ORAL | Status: DC

## 2012-07-04 NOTE — Telephone Encounter (Signed)
PLEASE PRINT CALL PT WHEN READY. LAST OV 2/14 WITH [facility]. LAST REFILL 05/23/12

## 2012-08-01 ENCOUNTER — Encounter: Payer: Self-pay | Admitting: Nurse Practitioner

## 2012-08-01 ENCOUNTER — Ambulatory Visit (INDEPENDENT_AMBULATORY_CARE_PROVIDER_SITE_OTHER): Payer: Medicare Other | Admitting: Nurse Practitioner

## 2012-08-01 VITALS — BP 147/74 | HR 70 | Temp 97.3°F | Ht 67.0 in | Wt 156.0 lb

## 2012-08-01 DIAGNOSIS — I10 Essential (primary) hypertension: Secondary | ICD-10-CM

## 2012-08-01 DIAGNOSIS — M797 Fibromyalgia: Secondary | ICD-10-CM

## 2012-08-01 DIAGNOSIS — F411 Generalized anxiety disorder: Secondary | ICD-10-CM

## 2012-08-01 DIAGNOSIS — IMO0001 Reserved for inherently not codable concepts without codable children: Secondary | ICD-10-CM

## 2012-08-01 DIAGNOSIS — K589 Irritable bowel syndrome without diarrhea: Secondary | ICD-10-CM

## 2012-08-01 LAB — COMPLETE METABOLIC PANEL WITH GFR
Albumin: 3.5 g/dL (ref 3.5–5.2)
Alkaline Phosphatase: 58 U/L (ref 39–117)
BUN: 15 mg/dL (ref 6–23)
Calcium: 9.1 mg/dL (ref 8.4–10.5)
GFR, Est African American: 61 mL/min
GFR, Est Non African American: 53 mL/min — ABNORMAL LOW
Glucose, Bld: 106 mg/dL — ABNORMAL HIGH (ref 70–99)
Potassium: 4.4 mEq/L (ref 3.5–5.3)

## 2012-08-01 MED ORDER — TRAMADOL HCL 50 MG PO TABS: ORAL_TABLET | ORAL | Status: DC

## 2012-08-01 MED ORDER — ALPRAZOLAM 0.5 MG PO TABS: 0.5000 mg | ORAL_TABLET | Freq: Two times a day (BID) | ORAL | Status: DC | PRN

## 2012-08-01 NOTE — Progress Notes (Signed)
  Subjective:    Patient ID: [name], female    DOB: 01/22/1941, 72 y.o.   MRN: [id number]  HPI Patient here today for follow up of multiple medical problems. She has hx of fibromyagia and takes hydrocodone and ultram to control pain. She says that the tramadol makes her legs hurt worse and make she weak. She gets the hydrocodone from Dr [name] for her ulcerative colitis. Most of her oain comes from her ulcerative colitis. She is doing okay otherwise. * Patient has gotten 3 ticks off of her in the last several days. The one behind left ear was there for more than 2 days according to patient. No C/O fatigue- other than her usual.   Review of Systems  Constitutional: Negative.   HENT: Negative.   Respiratory: Negative.   Cardiovascular: Negative.   Gastrointestinal: Positive for abdominal pain, diarrhea and constipation.  Endocrine: Negative.   Genitourinary: Negative.   Musculoskeletal: Negative.   Neurological: Negative.   Hematological: Negative.   Psychiatric/Behavioral: Negative.        Objective:   Physical Exam  Constitutional: She is oriented to person, place, and time. She appears well-developed and well-nourished.  HENT:  Nose: Nose normal.  Mouth/Throat: Oropharynx is clear and moist.  Eyes: EOM are normal.  Neck: Trachea normal, normal range of motion and full passive range of motion without pain. Neck supple. No JVD present. Carotid bruit is not present. No thyromegaly present.  Cardiovascular: Normal rate, regular rhythm, normal heart sounds and intact distal pulses.  Exam reveals no gallop and no friction rub.   No murmur heard. Pulmonary/Chest: Effort normal and breath sounds normal.  Abdominal: Soft. Bowel sounds are normal. She exhibits no distension and no mass. There is tenderness (right lower quadrant). There is no rebound and no guarding.  Musculoskeletal: Normal range of motion.  Lymphadenopathy:    She has no cervical adenopathy.  Neurological: She is  alert and oriented to person, place, and time. She has normal reflexes.  Skin: Skin is warm and dry.  Psychiatric: She has a normal mood and affect. Her behavior is normal. Judgment and thought content normal.   BP 147/74  Pulse 70  Temp(Src) 97.3 F (36.3 C) (Oral)  Ht 5' 7"  (1.702 m)  Wt 156 lb (70.761 kg)  BMI 24.43 kg/m2        Assessment & Plan:  1. Hypertension Low Na+ diet Low fat diet an dexercise - COMPLETE METABOLIC PANEL WITH GFR - NMR Lipoprofile with Lipids  2. Fibromyalgia exercise - traMADol (ULTRAM) 50 MG tablet; TAKE ONE TABLET BY MOUTH ONCE DAILY  Dispense: 30 tablet; Refill: 1  3. IBS (irritable bowel syndrome) Keep F/U with Dr. [name]  4. GAD (generalized anxiety disorder) Stress management - ALPRAZolam (XANAX) 0.5 MG tablet; Take 1 tablet (0.5 mg total) by mouth 2 (two) times daily as needed. FOR ANXIETY  Dispense: 60 tablet; Refill: 0   [name] Hassell Done, FNP

## 2012-08-01 NOTE — Patient Instructions (Signed)

## 2012-08-03 LAB — NMR LIPOPROFILE WITH LIPIDS
HDL Size: 8.7 nm — ABNORMAL LOW (ref >=9.2)
HDL-C: 55 mg/dL (ref >=40)
LDL (calc): 111 mg/dL — ABNORMAL HIGH (ref <100)
LDL Particle Number: 1564 nmol/L — ABNORMAL HIGH (ref <1000)
LDL Size: 20.9 nm (ref >20.5)
LP-IR Score: 55 — ABNORMAL HIGH (ref <=45)
Large HDL-P: 4 umol/L — ABNORMAL LOW (ref >=4.8)
Triglycerides: 171 mg/dL — ABNORMAL HIGH (ref <150)
VLDL Size: 42.8 nm (ref <=46.6)

## 2012-08-11 ENCOUNTER — Other Ambulatory Visit (INDEPENDENT_AMBULATORY_CARE_PROVIDER_SITE_OTHER): Payer: Self-pay | Admitting: Internal Medicine

## 2012-08-11 DIAGNOSIS — K5190 Ulcerative colitis, unspecified, without complications: Secondary | ICD-10-CM

## 2012-08-11 MED ORDER — HYDROCODONE-ACETAMINOPHEN 5-325 MG PO TABS: 1.0000 | ORAL_TABLET | ORAL | Status: DC | PRN

## 2012-09-03 ENCOUNTER — Other Ambulatory Visit: Payer: Self-pay | Admitting: *Deleted

## 2012-09-03 DIAGNOSIS — F411 Generalized anxiety disorder: Secondary | ICD-10-CM

## 2012-09-03 NOTE — Telephone Encounter (Signed)
Last seen and filled 08/01/12, have nurse call into [facility]

## 2012-09-05 MED ORDER — ALPRAZOLAM 0.5 MG PO TABS: 0.5000 mg | ORAL_TABLET | Freq: Two times a day (BID) | ORAL | Status: DC | PRN

## 2012-09-05 NOTE — Telephone Encounter (Signed)
RX called to [facility].

## 2012-09-05 NOTE — Telephone Encounter (Signed)
Call in alprazolam rx with 1 refill

## 2012-09-20 ENCOUNTER — Ambulatory Visit (INDEPENDENT_AMBULATORY_CARE_PROVIDER_SITE_OTHER): Payer: MEDICARE | Admitting: Internal Medicine

## 2012-09-20 ENCOUNTER — Encounter (INDEPENDENT_AMBULATORY_CARE_PROVIDER_SITE_OTHER): Payer: Self-pay | Admitting: Internal Medicine

## 2012-09-20 VITALS — BP 126/60 | HR 60 | Temp 97.6°F | Ht 68.5 in | Wt 156.7 lb

## 2012-09-20 DIAGNOSIS — K51219 Ulcerative (chronic) proctitis with unspecified complications: Secondary | ICD-10-CM

## 2012-09-20 DIAGNOSIS — K5120 Ulcerative (chronic) proctitis without complications: Secondary | ICD-10-CM

## 2012-09-20 LAB — CBC WITH DIFFERENTIAL/PLATELET
Basophils Absolute: 0 10*3/uL (ref 0.0–0.1)
Eosinophils Relative: 0 % (ref 0–5)
Lymphocytes Relative: 9 % — ABNORMAL LOW (ref 12–46)
Lymphs Abs: 0.8 10*3/uL (ref 0.7–4.0)
MCV: 87.1 fL (ref 78.0–100.0)
Neutrophils Relative %: 88 % — ABNORMAL HIGH (ref 43–77)
Platelets: 383 10*3/uL (ref 150–400)
RBC: 4.33 MIL/uL (ref 3.87–5.11)
RDW: 14.8 % (ref 11.5–15.5)
WBC: 8.7 10*3/uL (ref 4.0–10.5)

## 2012-09-20 MED ORDER — ONDANSETRON HCL 4 MG PO TABS: ORAL_TABLET | ORAL | Status: DC

## 2012-09-20 MED ORDER — PREDNISONE 5 MG PO TABS: 10.0000 mg | ORAL_TABLET | Freq: Every day | ORAL | Status: DC

## 2012-09-20 MED ORDER — HYDROCODONE-ACETAMINOPHEN 5-325 MG PO TABS: 1.0000 | ORAL_TABLET | ORAL | Status: DC | PRN

## 2012-09-20 NOTE — Progress Notes (Signed)
Subjective:     Patient ID: [name], female   DOB: 07/31/1940, 72 y.o.   MRN: [id number]  HPI Here today for f/u of her UC. She tells me she has diarrhea. She has pain in her legs and rectum. Symptoms for along time. Presently taking Prednisone 57m daily (takes on her own). Stopped the Pentasa because diarrhea became worse and she had stomach cramps. She continues to have diarrhea. She takes Imodium as needed. She takes Tramadol, xanax, and Hydrocodone. She is trying a gluten free diet. Appetite is good. She usually has a BM x 10 a day. She says they are small. She occasionally has rectal bleeding. For the most part she feels fair.   She has been intolerate of UC drugs in the past.   She started the Prednisone in May for possible UC flare. (245mand she reduced to 1053mnd has been on since)    CMP     Component Value Date/Time   NA 139 08/01/2012 1100   K 4.4 08/01/2012 1100   CL 103 08/01/2012 1100   CO2 27 08/01/2012 1100   GLUCOSE 106* 08/01/2012 1100   BUN 15 08/01/2012 1100   CREATININE 1.06 08/01/2012 1100   CREATININE 1.37* 06/07/2011 2029   CALCIUM 9.1 08/01/2012 1100   PROT 6.2 08/01/2012 1100   ALBUMIN 3.5 08/01/2012 1100   AST 17 08/01/2012 1100   ALT 10 08/01/2012 1100   ALKPHOS 58 08/01/2012 1100   BILITOT 0.5 08/01/2012 1100   GFRNONAA 38* 06/07/2011 2029   GFRAA 44* 06/07/2011 2029      09/16/2010 Colonoscopy: FINAL DIAGNOSES:  1. Examination performed to cecum.  2. Active disease with transition at 28 cm from anal margin. Multiple  ulcers involving the rectal mucosa and distal sigmoid colon.  3. Scattered diverticula throughout the colon.  4. Small cecal polyp cold biopsied and residual treated with APC.  5. A 10-mm polyp snared from the rectum.  6. Biopsies taken from mucosa of sigmoid colorectum.  Biopsy: Tubular adenoma. No high grade dysplasia or malignancy.  Review of Systems         Review of Systems see hpi Current Outpatient Prescriptions   Medication Sig Dispense Refill  . acetaminophen (TYLENOL) 500 MG tablet Take 500 mg by mouth every 6 (six) hours as needed. FOR PAIN      . ALPRAZolam (XANAX) 0.5 MG tablet Take 1 tablet (0.5 mg total) by mouth 2 (two) times daily as needed. FOR ANXIETY  60 tablet  1  . antiseptic oral rinse (BIOTENE) LIQD 1 application by Mouth Rinse route as needed. FOR DRY MOUTH       . B Complex CAPS Take 1 capsule by mouth daily.        . benazepril-hydrochlorthiazide (LOTENSIN HCT) 20-12.5 MG per tablet Take 1 tablet by mouth daily.        . Cholecalciferol (VITAMIN D3) 1000 UNITS CAPS Take 1 capsule by mouth daily.        . cMarland Kitchen-enzyme Q-10 50 MG capsule Take 50 mg by mouth daily.        . eMarland Kitchencitalopram (LEXAPRO) 20 MG tablet Take 20 mg by mouth at bedtime.        . HMarland KitchenDROcodone-acetaminophen (NORCO/VICODIN) 5-325 MG per tablet Take 1 tablet by mouth every 4 (four) hours as needed for pain.  30 tablet  0  . Multiple Vitamins-Minerals (WOMENS MULTI VITAMIN & MINERAL PO) Take 1 tablet by mouth daily.        .Marland Kitchen  Omega-3 Fatty Acids (FISH OIL) 1000 MG CPDR Take 1 capsule by mouth daily.        . ondansetron (ZOFRAN) 4 MG tablet TAKE (1) TABLET BY MOUTH EVERY FOUR HOURS AS NEEDED FOR NAUSEA.  30 tablet  1  . Petrolatum-Zinc Oxide (SENSI-CARE PROTECTIVE BARRIER EX) Apply 1 application topically daily.        . predniSONE (DELTASONE) 5 MG tablet Take 10 mg by mouth daily.      . Probiotic Product (PROBIOTIC FORMULA PO) Take 1-2 capsules by mouth daily.        . traMADol (ULTRAM) 50 MG tablet TAKE ONE TABLET BY MOUTH ONCE DAILY  30 tablet  1  . vitamin A 10000 UNIT capsule Take 10,000 Units by mouth daily.        . vitamin C (ASCORBIC ACID) 500 MG tablet Take 500 mg by mouth daily.        . vitamin E 400 UNIT capsule Take 400 Units by mouth daily.        Marland Kitchen zolpidem (AMBIEN) 10 MG tablet Take 10 mg by mouth at bedtime as needed. FOR SLEEP       . [DISCONTINUED] sodium chloride (OCEAN) 0.65 % SOLN nasal spray Place 2  sprays into the nose as needed for congestion.       No current facility-administered medications for this visit.   Past Medical History  Diagnosis Date  . Hypertension   . Ulcerative colitis   . Fibromyalgia   . Ulcerative colitis   . Rectal bleeding   . Diarrhea   . Hyperglycemia, drug-induced 05/02/2011  . Anemia due to blood loss 05/02/2011  . Hypokalemia    Past Surgical History  Procedure Laterality Date  . Cholecystectomy      s/p  . Colonoscopy  06/16/06. 04/27/2007    proctitis seen, biopsies showed chronic active colitis, no dysplasia   . Abdominal hysterectomy     Allergies  Allergen Reactions  . Azathioprine Nausea And Vomiting and Other (See Comments)    SEVERE NAUSEA AND VOMITING WITH CHEST PAIN-  PATIENT INSISTS NEVER TO BE GIVEN ANYTHING SIMILAR TO THIS  . Tape Other (See Comments)    BLISTERS AND SKIN TEARING  . Codeine Nausea And Vomiting  . Penicillins Hives  . Povidone Hives    BETADINE  . Procaine Hcl Other (See Comments)    SEVERE GI UPSET (NAUSEA,VOMITING AND DIARRHEA)  . Sulfonamide Derivatives Hives  . Azithromycin     Mycins   . Dairy Aid (Lactase)   . Latex Rash  . Niacin And Related Rash        Objective:   Physical Exam  Filed Vitals:   09/20/12 1404  BP: 126/60  Pulse: 60  Temp: 97.6 F (36.4 C)  Height: 5' 8.5" (1.74 m)  Weight: 156 lb 11.2 oz (71.079 kg)    Alert and oriented. Skin warm and dry. Oral mucosa is moist.   . Sclera anicteric, conjunctivae is pink. Thyroid not enlarged. No cervical lymphadenopathy. Lungs clear. Heart regular rate and rhythm.  Abdomen is soft. Bowel sounds are positive. No hepatomegaly. No abdominal masses felt. No tenderness.  No edema to lower extremities.       Assessment:    UC. She probably is still active. Presently taking Prednisone 29m daily. Intolerant of UC medications in the past.      Plan:   OV in 6 months. CBC, Cmet, and sedrate today.  Refills on Zofran, Hydrocodone, and  Prednisone.

## 2012-09-20 NOTE — Patient Instructions (Addendum)
OV in 6 months. CBC, CMET, and sedrate today

## 2012-09-21 ENCOUNTER — Ambulatory Visit (INDEPENDENT_AMBULATORY_CARE_PROVIDER_SITE_OTHER): Payer: MEDICARE | Admitting: Internal Medicine

## 2012-09-21 LAB — COMPREHENSIVE METABOLIC PANEL
ALT: 14 U/L (ref 0–35)
AST: 17 U/L (ref 0–37)
CO2: 29 mEq/L (ref 19–32)
Calcium: 9.1 mg/dL (ref 8.4–10.5)
Chloride: 103 mEq/L (ref 96–112)
Creat: 1.22 mg/dL — ABNORMAL HIGH (ref 0.50–1.10)
Sodium: 138 mEq/L (ref 135–145)
Total Bilirubin: 0.6 mg/dL (ref 0.3–1.2)
Total Protein: 6.6 g/dL (ref 6.0–8.3)

## 2012-09-21 LAB — SEDIMENTATION RATE: Sed Rate: 16 mm/hr (ref 0–22)

## 2012-10-20 ENCOUNTER — Ambulatory Visit (INDEPENDENT_AMBULATORY_CARE_PROVIDER_SITE_OTHER): Payer: Medicare Other | Admitting: Nurse Practitioner

## 2012-10-20 VITALS — BP 142/68 | HR 72 | Temp 97.0°F | Ht 68.5 in | Wt 158.8 lb

## 2012-10-20 DIAGNOSIS — S81809A Unspecified open wound, unspecified lower leg, initial encounter: Secondary | ICD-10-CM

## 2012-10-20 DIAGNOSIS — L03116 Cellulitis of left lower limb: Secondary | ICD-10-CM

## 2012-10-20 DIAGNOSIS — S81009A Unspecified open wound, unspecified knee, initial encounter: Secondary | ICD-10-CM

## 2012-10-20 DIAGNOSIS — S81812A Laceration without foreign body, left lower leg, initial encounter: Secondary | ICD-10-CM

## 2012-10-20 DIAGNOSIS — L02419 Cutaneous abscess of limb, unspecified: Secondary | ICD-10-CM

## 2012-10-20 DIAGNOSIS — Z23 Encounter for immunization: Secondary | ICD-10-CM

## 2012-10-20 DIAGNOSIS — L03119 Cellulitis of unspecified part of limb: Secondary | ICD-10-CM

## 2012-10-20 MED ORDER — CIPROFLOXACIN HCL 500 MG PO TABS: 500.0000 mg | ORAL_TABLET | Freq: Two times a day (BID) | ORAL | Status: DC

## 2012-10-20 NOTE — Patient Instructions (Signed)
Wound Care Wound care helps prevent pain and infection.  You may need a tetanus shot if:  You cannot remember when you had your last tetanus shot.  You have never had a tetanus shot.  The injury broke your skin. If you need a tetanus shot and you choose not to have one, you may get tetanus. Sickness from tetanus can be serious. HOME CARE   Only take medicine as told by your doctor.  Clean the wound daily with mild soap and water.  Change any bandages (dressings) as told by your doctor.  Put medicated cream and a bandage on the wound as told by your doctor.  Change the bandage if it gets wet, dirty, or starts to smell.  Take showers. Do not take baths, swim, or do anything that puts your wound under water.  Rest and raise (elevate) the wound until the pain and puffiness (swelling) are better.  Keep all doctor visits as told. GET HELP RIGHT AWAY IF:   Yellowish-white fluid (pus) comes from the wound.  Medicine does not lessen your pain.  There is a red streak going away from the wound.  You have a fever. MAKE SURE YOU:   Understand these instructions.  Will watch your condition.  Will get help right away if you are not doing well or get worse. Document Released: 12/17/2007 Document Revised: 06/01/2011 Document Reviewed: 07/13/2010 [facility] Patient Information 2014 [place], [place].

## 2012-10-20 NOTE — Addendum Note (Signed)
Addended by: Josephine Cables on: 10/20/2012 04:36 PM   Modules accepted: Orders

## 2012-10-20 NOTE — Progress Notes (Signed)
  Subjective:    Patient ID: [name], female    DOB: 1940/10/07, 72 y.o.   MRN: [id number]  HPI Patient said Sunday before last she was taking care of neighbors dog and fail against a brick wall an dlacerated her left shin- not looking good. Last TD unknown    Review of Systems  All other systems reviewed and are negative.       Objective:   Physical Exam  Constitutional: She appears well-developed and well-nourished.  Cardiovascular: Normal rate and normal heart sounds.   Pulmonary/Chest: Effort normal and breath sounds normal.  Skin:     6cm jagged laceration to left shin- surrounding echymosis and edema    BP 142/68  Pulse 72  Temp(Src) 97 F (36.1 C) (Oral)  Ht 5' 8.5" (1.74 m)  Wt 158 lb 12.8 oz (72.031 kg)  BMI 23.79 kg/m2       Assessment & Plan:   1. Laceration of leg excluding thigh, left, initial encounter   2. Cellulitis of leg, left    Clean wound with antibacterial soap BID Take cipro as rx Do not pick or scratch at area Follow up if not improving Elevate leg when sitting  Mary-Margaret Hassell Done, FNP

## 2012-11-02 ENCOUNTER — Other Ambulatory Visit: Payer: Self-pay | Admitting: Nurse Practitioner

## 2012-11-04 ENCOUNTER — Other Ambulatory Visit: Payer: Self-pay

## 2012-11-04 DIAGNOSIS — F411 Generalized anxiety disorder: Secondary | ICD-10-CM

## 2012-11-04 MED ORDER — ALPRAZOLAM 0.5 MG PO TABS: 0.5000 mg | ORAL_TABLET | Freq: Two times a day (BID) | ORAL | Status: DC | PRN

## 2012-11-04 NOTE — Telephone Encounter (Signed)
Last filled 09/01/12, last seen 10/20/12. Will print, call pt when ready

## 2012-11-04 NOTE — Telephone Encounter (Signed)
Last seen 07/22/12  MMM   If approved route to nurse to phone in

## 2012-11-04 NOTE — Telephone Encounter (Signed)
rx ready for pick up

## 2012-11-04 NOTE — Telephone Encounter (Signed)
Pleas ecall in xanax rx with 1 refill

## 2012-11-04 NOTE — Telephone Encounter (Signed)
Last seen 07/22/12  MMM If approved route to nurse to call in

## 2012-11-04 NOTE — Telephone Encounter (Signed)
Up front

## 2012-11-07 ENCOUNTER — Other Ambulatory Visit: Payer: Self-pay | Admitting: *Deleted

## 2012-11-07 MED ORDER — ZOLPIDEM TARTRATE 10 MG PO TABS: 10.0000 mg | ORAL_TABLET | Freq: Every evening | ORAL | Status: DC | PRN

## 2012-11-07 NOTE — Telephone Encounter (Signed)
LAST OV 5/14. LAST RF 09/01/12. CALL IN [facility] [phone].

## 2012-11-07 NOTE — Telephone Encounter (Signed)
Please call in [facility] rx with 1 refill

## 2012-11-08 NOTE — Telephone Encounter (Signed)
Called rx into [facility]

## 2012-11-09 ENCOUNTER — Telehealth: Payer: Self-pay | Admitting: Nurse Practitioner

## 2012-11-10 ENCOUNTER — Other Ambulatory Visit (INDEPENDENT_AMBULATORY_CARE_PROVIDER_SITE_OTHER): Payer: Self-pay | Admitting: Internal Medicine

## 2012-11-10 DIAGNOSIS — K51219 Ulcerative (chronic) proctitis with unspecified complications: Secondary | ICD-10-CM

## 2012-11-10 MED ORDER — HYDROCODONE-ACETAMINOPHEN 5-325 MG PO TABS: 1.0000 | ORAL_TABLET | ORAL | Status: DC | PRN

## 2012-11-23 ENCOUNTER — Telehealth: Payer: Self-pay | Admitting: Cardiology

## 2012-11-23 NOTE — Telephone Encounter (Signed)
Received request from Nurse.   To: [facility] Fax number: [phone] Attention:Danielle Stop Coumadin? 11/23/12/KM

## 2012-12-07 ENCOUNTER — Other Ambulatory Visit: Payer: Self-pay | Admitting: *Deleted

## 2012-12-07 MED ORDER — TRAMADOL HCL 50 MG PO TABS: 50.0000 mg | ORAL_TABLET | Freq: Every day | ORAL | Status: DC

## 2012-12-07 NOTE — Telephone Encounter (Signed)
LAST RF 11/07/12. LAST OV 5/14. PRINT AND LET PT KNOW. SEE ALLERGIES. THANKS.

## 2012-12-07 NOTE — Telephone Encounter (Signed)
Up front

## 2012-12-07 NOTE — Telephone Encounter (Signed)
rx ready for pick up- NTBS for next refill

## 2013-01-05 ENCOUNTER — Other Ambulatory Visit: Payer: Self-pay

## 2013-01-05 DIAGNOSIS — F411 Generalized anxiety disorder: Secondary | ICD-10-CM

## 2013-01-05 MED ORDER — ZOLPIDEM TARTRATE 10 MG PO TABS: 10.0000 mg | ORAL_TABLET | Freq: Every evening | ORAL | Status: DC | PRN

## 2013-01-05 MED ORDER — TRAMADOL HCL 50 MG PO TABS: 50.0000 mg | ORAL_TABLET | Freq: Every day | ORAL | Status: DC

## 2013-01-05 NOTE — Telephone Encounter (Signed)
Last seen 08/01/12 MMM   If approved route to nurse to phone into [facility] [phone]

## 2013-01-05 NOTE — Telephone Encounter (Signed)
rx ready for pick up

## 2013-01-05 NOTE — Telephone Encounter (Signed)
Last seen 08/01/12  MMM   If approved print and route to nurse

## 2013-01-06 ENCOUNTER — Other Ambulatory Visit: Payer: Self-pay | Admitting: Nurse Practitioner

## 2013-01-06 NOTE — Telephone Encounter (Signed)
PATIENT IS AWARE TO PICK UP

## 2013-01-09 ENCOUNTER — Telehealth: Payer: Self-pay | Admitting: Nurse Practitioner

## 2013-01-09 ENCOUNTER — Other Ambulatory Visit: Payer: Self-pay

## 2013-01-09 DIAGNOSIS — F411 Generalized anxiety disorder: Secondary | ICD-10-CM

## 2013-01-09 MED ORDER — ALPRAZOLAM 0.5 MG PO TABS: 0.5000 mg | ORAL_TABLET | Freq: Two times a day (BID) | ORAL | Status: DC | PRN

## 2013-01-09 NOTE — Telephone Encounter (Signed)
Last seen 08/01/12  MMM  If approved route to nurse to phone into [facility]  [phone]

## 2013-01-09 NOTE — Telephone Encounter (Signed)
Please call in xanax RX

## 2013-01-09 NOTE — Telephone Encounter (Signed)
Xanax rx was suppose to be phoned in on 01/09/13- please check on this

## 2013-01-10 NOTE — Telephone Encounter (Signed)
CALLED IN

## 2013-01-10 NOTE — Telephone Encounter (Signed)
Called in.

## 2013-01-23 ENCOUNTER — Other Ambulatory Visit (INDEPENDENT_AMBULATORY_CARE_PROVIDER_SITE_OTHER): Payer: Self-pay | Admitting: Internal Medicine

## 2013-01-23 ENCOUNTER — Other Ambulatory Visit (INDEPENDENT_AMBULATORY_CARE_PROVIDER_SITE_OTHER): Payer: Self-pay | Admitting: *Deleted

## 2013-01-23 DIAGNOSIS — K51219 Ulcerative (chronic) proctitis with unspecified complications: Secondary | ICD-10-CM

## 2013-01-23 MED ORDER — HYDROCODONE-ACETAMINOPHEN 5-300 MG PO TABS: 1.0000 | ORAL_TABLET | Freq: Two times a day (BID) | ORAL | Status: DC | PRN

## 2013-01-23 NOTE — Telephone Encounter (Signed)
We rec'd a refill request from [facility] for this medication , it was last filled 11/10/12.

## 2013-01-23 NOTE — Telephone Encounter (Signed)
A driver  Per Ovid Curd @ [place]  Will be coming by to pick this up for the patient as she currently has shingles.

## 2013-02-07 ENCOUNTER — Other Ambulatory Visit: Payer: Self-pay | Admitting: Nurse Practitioner

## 2013-02-08 ENCOUNTER — Other Ambulatory Visit: Payer: Self-pay | Admitting: *Deleted

## 2013-02-08 DIAGNOSIS — F411 Generalized anxiety disorder: Secondary | ICD-10-CM

## 2013-02-08 MED ORDER — ALPRAZOLAM 0.5 MG PO TABS: 0.5000 mg | ORAL_TABLET | Freq: Two times a day (BID) | ORAL | Status: DC | PRN

## 2013-02-08 MED ORDER — TRAMADOL HCL 50 MG PO TABS: 50.0000 mg | ORAL_TABLET | Freq: Every day | ORAL | Status: DC

## 2013-02-08 NOTE — Telephone Encounter (Signed)
LAST RF ON [name] 01/10/13. [place] [phone]

## 2013-02-08 NOTE — Telephone Encounter (Signed)
LAST RF 01/10/13. LAST OV 08/01/12. PRINT AND CALL PT WHEN READY.

## 2013-02-08 NOTE — Telephone Encounter (Signed)
rx ready for pick up

## 2013-02-09 NOTE — Telephone Encounter (Signed)
Patient aware.

## 2013-02-22 ENCOUNTER — Other Ambulatory Visit (INDEPENDENT_AMBULATORY_CARE_PROVIDER_SITE_OTHER): Payer: Self-pay | Admitting: Internal Medicine

## 2013-02-23 NOTE — Telephone Encounter (Signed)
Has an apt scheduled for 03/27/13.

## 2013-03-01 ENCOUNTER — Telehealth (INDEPENDENT_AMBULATORY_CARE_PROVIDER_SITE_OTHER): Payer: Self-pay | Admitting: *Deleted

## 2013-03-01 NOTE — Telephone Encounter (Signed)
If she is passing blood with abdominal pain she needs to go to the ED. There is nothing I can do here in the office. Please call her and let her know.

## 2013-03-01 NOTE — Telephone Encounter (Signed)
Call patient and she said she just spoke with Terri. Apt has been scheduled for 03/02/13.

## 2013-03-01 NOTE — Telephone Encounter (Signed)
States she is having crampy abdominal pain. She has seen some blood. She wants to know if we can increase her pain medication. I advised her if she was hurting that bad she should either go to the ED or make an appt at our office. Labs probably needs to be drawn to rule out a UC flare.

## 2013-03-01 NOTE — Telephone Encounter (Signed)
She is experiencing a lot of passing blood with abd pain. Would like for [name] to return her call at [phone].

## 2013-03-02 ENCOUNTER — Encounter (INDEPENDENT_AMBULATORY_CARE_PROVIDER_SITE_OTHER): Payer: Self-pay | Admitting: Internal Medicine

## 2013-03-02 ENCOUNTER — Ambulatory Visit (INDEPENDENT_AMBULATORY_CARE_PROVIDER_SITE_OTHER): Payer: Medicare Other | Admitting: Internal Medicine

## 2013-03-02 VITALS — BP 132/72 | HR 80 | Temp 98.2°F | Ht 68.5 in | Wt 160.9 lb

## 2013-03-02 DIAGNOSIS — K5120 Ulcerative (chronic) proctitis without complications: Secondary | ICD-10-CM

## 2013-03-02 LAB — CBC WITH DIFFERENTIAL/PLATELET
Basophils Absolute: 0 10*3/uL (ref 0.0–0.1)
Basophils Relative: 0 % (ref 0–1)
Eosinophils Absolute: 0 10*3/uL (ref 0.0–0.7)
Eosinophils Relative: 0 % (ref 0–5)
HCT: 38.2 % (ref 36.0–46.0)
Hemoglobin: 12.9 g/dL (ref 12.0–15.0)
Lymphs Abs: 1.2 10*3/uL (ref 0.7–4.0)
MCH: 29.4 pg (ref 26.0–34.0)
MCHC: 33.8 g/dL (ref 30.0–36.0)
MCV: 87 fL (ref 78.0–100.0)
Monocytes Absolute: 0.4 10*3/uL (ref 0.1–1.0)
Monocytes Relative: 4 % (ref 3–12)
RBC: 4.39 MIL/uL (ref 3.87–5.11)

## 2013-03-02 NOTE — Patient Instructions (Signed)
OV in 6 weeks with Dr. [name]. CBC and crp today

## 2013-03-02 NOTE — Progress Notes (Signed)
Subjective:     Patient ID: [name], female   DOB: 1940-04-09, 72 y.o.   MRN: [id number]  HPI Here today as an urgent visit.  She has lower left abdominal pain. The pain started 2 weeks ago. She also tells me she has had some rectal bleeding. She has a hx of UC and was diagnosed 6 yrs ago at the [facility].  She tells me she has had 8 stools today. She usually has 8 stools a day. Her stools have been loose lately. She sees bloody watery first. Her appetite is okay. She has gained 6 pounds since her last visit in July. She thinks she is having a flare with her UC at this time.  08/2010 Colonoscopy Dr. [name]: Hx of UC: 09/16/2010 Colonoscopy: FINAL DIAGNOSES:  1. Examination performed to cecum.  2. Active disease with transition at 28 cm from anal margin. Multiple  ulcers involving the rectal mucosa and distal sigmoid colon.  3. Scattered diverticula throughout the colon.  4. Small cecal polyp cold biopsied and residual treated with APC.  5. A 10-mm polyp snared from the rectum.  6. Biopsies taken from mucosa of sigmoid colorectum.  Biopsy: Tubular adenoma. No high grade dysplasia or malignancy.  Review of Systems     Review of Systems Current Outpatient Prescriptions  Medication Sig Dispense Refill  . acetaminophen (TYLENOL) 500 MG tablet Take 500 mg by mouth every 6 (six) hours as needed. FOR PAIN      . ALPRAZolam (XANAX) 0.5 MG tablet Take 1 tablet (0.5 mg total) by mouth 2 (two) times daily as needed. FOR ANXIETY  60 tablet  0  . antiseptic oral rinse (BIOTENE) LIQD 1 application by Mouth Rinse route as needed. FOR DRY MOUTH       . B Complex CAPS Take 1 capsule by mouth daily.        . benazepril-hydrochlorthiazide (LOTENSIN HCT) 20-12.5 MG per tablet TAKE 1 TABLET ONCE DAILY.  30 tablet  0  . Cholecalciferol (VITAMIN D3) 1000 UNITS CAPS Take 1 capsule by mouth daily.        Marland Kitchen co-enzyme Q-10 50 MG capsule Take 50 mg by mouth daily.        . Hydrocodone-Acetaminophen 5-300 MG  TABS Take 1 tablet by mouth 2 (two) times daily as needed.  60 each  0  . Multiple Vitamins-Minerals (WOMENS MULTI VITAMIN & MINERAL PO) Take 1 tablet by mouth daily.        . Omega-3 Fatty Acids (FISH OIL) 1000 MG CPDR Take 1 capsule by mouth daily.        . ondansetron (ZOFRAN) 4 MG tablet TAKE (1) TABLET BY MOUTH EVERY FOUR HOURS AS NEEDED FOR NAUSEA.  30 tablet  1  . Petrolatum-Zinc Oxide (SENSI-CARE PROTECTIVE BARRIER EX) Apply 1 application topically daily.        . predniSONE (DELTASONE) 5 MG tablet TAKE 2 TABLETS BY MOUTH DAILY.  60 tablet  0  . Probiotic Product (PROBIOTIC FORMULA PO) Take 1-2 capsules by mouth daily.        . traMADol (ULTRAM) 50 MG tablet Take 1 tablet (50 mg total) by mouth daily.  30 tablet  0  . vitamin A 10000 UNIT capsule Take 10,000 Units by mouth daily.        . vitamin C (ASCORBIC ACID) 500 MG tablet Take 500 mg by mouth daily.        . vitamin E 400 UNIT capsule Take 400 Units by  mouth daily.        Marland Kitchen zolpidem (AMBIEN) 10 MG tablet Take 1 tablet (10 mg total) by mouth at bedtime as needed. FOR SLEEP  30 tablet  0  . [DISCONTINUED] sodium chloride (OCEAN) 0.65 % SOLN nasal spray Place 2 sprays into the nose as needed for congestion.       No current facility-administered medications for this visit.   Past Medical History  Diagnosis Date  . Hypertension   . Ulcerative colitis   . Fibromyalgia   . Ulcerative colitis   . Rectal bleeding   . Diarrhea   . Hyperglycemia, drug-induced 05/02/2011  . Anemia due to blood loss 05/02/2011  . Hypokalemia    Past Surgical History  Procedure Laterality Date  . Cholecystectomy      s/p  . Colonoscopy  06/16/06. 04/27/2007    proctitis seen, biopsies showed chronic active colitis, no dysplasia   . Abdominal hysterectomy     Allergies  Allergen Reactions  . Azathioprine Nausea And Vomiting and Other (See Comments)    SEVERE NAUSEA AND VOMITING WITH CHEST PAIN-  PATIENT INSISTS NEVER TO BE GIVEN ANYTHING SIMILAR TO  THIS  . Tape Other (See Comments)    BLISTERS AND SKIN TEARING  . Codeine Nausea And Vomiting  . Penicillins Hives  . Povidone Hives    BETADINE  . Procaine Hcl Other (See Comments)    SEVERE GI UPSET (NAUSEA,VOMITING AND DIARRHEA)  . Sulfonamide Derivatives Hives  . Azithromycin     Mycins   . Dairy Aid [Lactase]   . Latex Rash  . Niacin And Related Rash       Objective:   Physical Exam  Filed Vitals:   03/02/13 1529  BP: 132/72  Pulse: 80  Temp: 98.2 F (36.8 C)  Height: 5' 8.5" (1.74 m)  Weight: 160 lb 14.4 oz (72.984 kg)  Alert and oriented. Skin warm and dry. Oral mucosa is moist.   . Sclera anicteric, conjunctivae is pink. Thyroid not enlarged. No cervical lymphadenopathy. Lungs clear. Heart regular rate and rhythm.  Abdomen is soft. Bowel sounds are positive. No hepatomegaly. No abdominal masses felt. Tenderness left lower abdomen. .  No edema to lower extremities.         Assessment:     UC flare. She has been intolerant of all UC medications. Maintained on Prednisone 67m daily.     Plan:     CBC, CRP. Will start Prednisone 233mx 1 week and decrease by 18m82mach week. Stay on 33m71mter that.  OV with Dr. RehmLaural GoldenJanuary.

## 2013-03-08 ENCOUNTER — Telehealth: Payer: Self-pay | Admitting: Nurse Practitioner

## 2013-03-08 ENCOUNTER — Other Ambulatory Visit: Payer: Self-pay

## 2013-03-08 DIAGNOSIS — F411 Generalized anxiety disorder: Secondary | ICD-10-CM

## 2013-03-08 MED ORDER — ALPRAZOLAM 0.5 MG PO TABS: 0.5000 mg | ORAL_TABLET | Freq: Two times a day (BID) | ORAL | Status: DC | PRN

## 2013-03-08 NOTE — Telephone Encounter (Signed)
Last seen 5/14/ MMM If approved route to nurse to call into [facility]   [phone]

## 2013-03-08 NOTE — Telephone Encounter (Signed)
ntbs for future refills 

## 2013-03-08 NOTE — Telephone Encounter (Signed)
Last seen 08/01/12  MMM    If approved route to nurse to phone in to [facility]

## 2013-03-09 ENCOUNTER — Telehealth (INDEPENDENT_AMBULATORY_CARE_PROVIDER_SITE_OTHER): Payer: Self-pay | Admitting: *Deleted

## 2013-03-09 MED ORDER — HYDROCODONE-ACETAMINOPHEN 5-300 MG PO TABS: 1.0000 | ORAL_TABLET | Freq: Two times a day (BID) | ORAL | Status: DC | PRN

## 2013-03-09 MED ORDER — ZOLPIDEM TARTRATE 10 MG PO TABS: 10.0000 mg | ORAL_TABLET | Freq: Every evening | ORAL | Status: DC | PRN

## 2013-03-09 NOTE — Telephone Encounter (Signed)
Patient aware.

## 2013-03-09 NOTE — Telephone Encounter (Signed)
Would like to know the results of her lab work. She would also like a copy sent to [facility]. The return phone number is [phone].

## 2013-03-09 NOTE — Telephone Encounter (Signed)
[facility] is on EPIC, patient aware

## 2013-03-09 NOTE — Telephone Encounter (Signed)
Xanax was fille 03/08/13- others ready for pickup

## 2013-03-09 NOTE — Telephone Encounter (Signed)
Results given to patient.  Her Dr. Already has results of her lab

## 2013-03-10 ENCOUNTER — Telehealth: Payer: Self-pay | Admitting: *Deleted

## 2013-03-10 ENCOUNTER — Encounter (HOSPITAL_COMMUNITY): Payer: Self-pay | Admitting: Emergency Medicine

## 2013-03-10 ENCOUNTER — Inpatient Hospital Stay (HOSPITAL_COMMUNITY)
Admission: EM | Admit: 2013-03-10 | Discharge: 2013-03-15 | DRG: 387 | Disposition: A | Discharge status: Home Health | Payer: Medicare Other | Attending: Internal Medicine | Admitting: Internal Medicine

## 2013-03-10 ENCOUNTER — Emergency Department (HOSPITAL_COMMUNITY): Payer: Medicare Other

## 2013-03-10 DIAGNOSIS — IMO0001 Reserved for inherently not codable concepts without codable children: Secondary | ICD-10-CM | POA: Diagnosis present

## 2013-03-10 DIAGNOSIS — F3289 Other specified depressive episodes: Secondary | ICD-10-CM | POA: Diagnosis present

## 2013-03-10 DIAGNOSIS — IMO0002 Reserved for concepts with insufficient information to code with codable children: Secondary | ICD-10-CM

## 2013-03-10 DIAGNOSIS — I129 Hypertensive chronic kidney disease with stage 1 through stage 4 chronic kidney disease, or unspecified chronic kidney disease: Secondary | ICD-10-CM | POA: Diagnosis present

## 2013-03-10 DIAGNOSIS — E860 Dehydration: Secondary | ICD-10-CM

## 2013-03-10 DIAGNOSIS — F411 Generalized anxiety disorder: Secondary | ICD-10-CM

## 2013-03-10 DIAGNOSIS — R197 Diarrhea, unspecified: Secondary | ICD-10-CM

## 2013-03-10 DIAGNOSIS — R079 Chest pain, unspecified: Secondary | ICD-10-CM

## 2013-03-10 DIAGNOSIS — D500 Iron deficiency anemia secondary to blood loss (chronic): Secondary | ICD-10-CM | POA: Diagnosis present

## 2013-03-10 DIAGNOSIS — N289 Disorder of kidney and ureter, unspecified: Secondary | ICD-10-CM

## 2013-03-10 DIAGNOSIS — E876 Hypokalemia: Secondary | ICD-10-CM

## 2013-03-10 DIAGNOSIS — N189 Chronic kidney disease, unspecified: Secondary | ICD-10-CM | POA: Diagnosis present

## 2013-03-10 DIAGNOSIS — F329 Major depressive disorder, single episode, unspecified: Secondary | ICD-10-CM | POA: Diagnosis present

## 2013-03-10 DIAGNOSIS — I10 Essential (primary) hypertension: Secondary | ICD-10-CM

## 2013-03-10 DIAGNOSIS — F32A Depression, unspecified: Secondary | ICD-10-CM

## 2013-03-10 DIAGNOSIS — K5190 Ulcerative colitis, unspecified, without complications: Principal | ICD-10-CM

## 2013-03-10 DIAGNOSIS — Z79899 Other long term (current) drug therapy: Secondary | ICD-10-CM

## 2013-03-10 LAB — CBC WITH DIFFERENTIAL/PLATELET
Basophils Absolute: 0 10*3/uL (ref 0.0–0.1)
Eosinophils Absolute: 0.1 10*3/uL (ref 0.0–0.7)
Hemoglobin: 12.3 g/dL (ref 12.0–15.0)
Lymphocytes Relative: 23 % (ref 12–46)
Lymphs Abs: 2.5 10*3/uL (ref 0.7–4.0)
Monocytes Absolute: 0.7 10*3/uL (ref 0.1–1.0)
Neutrophils Relative %: 69 % (ref 43–77)
Platelets: 380 10*3/uL (ref 150–400)
RBC: 4.22 MIL/uL (ref 3.87–5.11)
RDW: 14.7 % (ref 11.5–15.5)
WBC: 10.6 10*3/uL — ABNORMAL HIGH (ref 4.0–10.5)

## 2013-03-10 LAB — COMPREHENSIVE METABOLIC PANEL
ALT: 13 U/L (ref 0–35)
AST: 18 U/L (ref 0–37)
Albumin: 3.2 g/dL — ABNORMAL LOW (ref 3.5–5.2)
Alkaline Phosphatase: 47 U/L (ref 39–117)
CO2: 27 mEq/L (ref 19–32)
Calcium: 8.8 mg/dL (ref 8.4–10.5)
GFR calc Af Amer: 46 mL/min — ABNORMAL LOW (ref >90)
Glucose, Bld: 120 mg/dL — ABNORMAL HIGH (ref 70–99)
Potassium: 3.6 mEq/L (ref 3.5–5.1)
Sodium: 136 mEq/L (ref 135–145)
Total Protein: 6.5 g/dL (ref 6.0–8.3)

## 2013-03-10 MED ORDER — SODIUM CHLORIDE 0.9 % IV BOLUS (SEPSIS)
1000.0000 mL | Freq: Once | INTRAVENOUS | Status: AC
  Administered 2013-03-10: 1000 mL via INTRAVENOUS

## 2013-03-10 MED ORDER — CIPROFLOXACIN IN D5W 400 MG/200ML IV SOLN
400.0000 mg | Freq: Once | INTRAVENOUS | Status: AC
  Administered 2013-03-11: 400 mg via INTRAVENOUS
  Filled 2013-03-10: qty 200

## 2013-03-10 MED ORDER — METRONIDAZOLE IN NACL 5-0.79 MG/ML-% IV SOLN
500.0000 mg | Freq: Once | INTRAVENOUS | Status: DC
  Administered 2013-03-11: 500 mg via INTRAVENOUS
  Filled 2013-03-10: qty 100

## 2013-03-10 MED ORDER — MORPHINE SULFATE 4 MG/ML IJ SOLN
4.0000 mg | Freq: Once | INTRAMUSCULAR | Status: AC
  Administered 2013-03-10: 4 mg via INTRAVENOUS
  Filled 2013-03-10: qty 1

## 2013-03-10 MED ORDER — ONDANSETRON HCL 4 MG/2ML IJ SOLN
4.0000 mg | Freq: Once | INTRAMUSCULAR | Status: AC
  Administered 2013-03-10: 4 mg via INTRAMUSCULAR
  Filled 2013-03-10: qty 2

## 2013-03-10 NOTE — ED Provider Notes (Signed)
CSN: [id number]     Arrival date & time 03/10/13  2058 History   First MD Initiated Contact with Patient 03/10/13 2129 This chart was scribed for Janice Norrie, MD by Anastasia Pall, ED Scribe. This patient was seen in room APA19/APA19 and the patient's care was started at 10:24 PM.    Chief Complaint  Patient presents with  . Abdominal Pain  . Diarrhea   The history is provided by the patient. No language interpreter was used.   HPI Comments: [name] is a 72 y.o. female with h/o ulcerative colitis for 6-7 years, who presents to the Emergency Department complaining of  moderate, constant, LLQ abdominal pain, that intermittently radiates across to her RLQ, onset 2 1/2  weeks ago. She states it started more in her RLQ and now it is in her LLQ. She reports this pain is consistent with her h/o ulcerative colitis, stating this is another flare up. She reports 18 episodes of watery diarrhea today, but denies presence of blood. She reports having blood in her diarrhea last week and seeing her GI Dr [name], who put her on higher dose of steroids. She is usually on prednisone 10 mg a day and he increased it to 25 mg a day for the past week.  She denies having passed blood since she was started on the higher dose of prednisone. She states the doctors told her to f/u to ED if abdominal pain, diarrhea persisted. She reports associated chills, dizziness, and weakness with her abdominal pain. She states her pain today is much worse than she has had the past couple of weeks. She reports h/o taking Zofran for nausea earlier this evening. She reports recently being on antibiotics. She denies emesis, fever, and any other associated symptoms. She states her ulcerative colitis runs in the family. She has not had abdominal surgery.   PCP - Jules Schick, NP PCP [facility] Department GI Dr [name] next appt 1/5  Past Medical History  Diagnosis Date  . Hypertension   . Ulcerative colitis   .  Fibromyalgia   . Ulcerative colitis   . Rectal bleeding   . Diarrhea   . Hyperglycemia, drug-induced 05/02/2011  . Anemia due to blood loss 05/02/2011  . Hypokalemia    Past Surgical History  Procedure Laterality Date  . Cholecystectomy      s/p  . Colonoscopy  06/16/06. 04/27/2007    proctitis seen, biopsies showed chronic active colitis, no dysplasia   . Abdominal hysterectomy     No family history on file. History  Substance Use Topics  . Smoking status: Never Smoker   . Smokeless tobacco: Not on file  . Alcohol Use: Yes     Comment: Ocassionally a small glass of wine. Patient states that is has been 2 months since she drank anything.   Lives at home  OB History   Grav Para Term Preterm Abortions TAB SAB Ect Mult Living                 Review of Systems  Constitutional: Positive for chills. Negative for fever.  Gastrointestinal: Positive for abdominal pain and diarrhea. Negative for nausea, vomiting and blood in stool.  Neurological: Positive for dizziness and weakness.  All other systems reviewed and are negative.   Allergies  Azathioprine; Tape; Codeine; Penicillins; Povidone; Procaine hcl; Sulfonamide derivatives; Azithromycin; Dairy aid; Latex; and Niacin and related  Home Medications   Current Outpatient Rx  Name  Route  Sig  Dispense  Refill  . acetaminophen (TYLENOL) 500 MG tablet   Oral   Take 500-1,000 mg by mouth every 6 (six) hours as needed. FOR PAIN         . ALPRAZolam (XANAX) 0.5 MG tablet   Oral   Take 1 tablet (0.5 mg total) by mouth 2 (two) times daily as needed. FOR ANXIETY   60 tablet   0   . antiseptic oral rinse (BIOTENE) LIQD   Mouth Rinse   1 application by Mouth Rinse route as needed. FOR DRY MOUTH          . B Complex CAPS   Oral   Take 1 capsule by mouth every morning.          . benazepril-hydrochlorthiazide (LOTENSIN HCT) 20-12.5 MG per tablet   Oral   Take 1 tablet by mouth daily.         . Cholecalciferol  (VITAMIN D3) 1000 UNITS CAPS   Oral   Take 1 capsule by mouth every morning.          Marland Kitchen co-enzyme Q-10 50 MG capsule   Oral   Take 50 mg by mouth once a week.          . escitalopram (LEXAPRO) 20 MG tablet   Oral   Take 20 mg by mouth daily.         . Hydrocodone-Acetaminophen 5-300 MG TABS   Oral   Take 1 tablet by mouth 2 (two) times daily as needed.   60 each   0   . Multiple Vitamins-Minerals (WOMENS MULTI VITAMIN & MINERAL PO)   Oral   Take 1 tablet by mouth every other day.          . Omega-3 Fatty Acids (FISH OIL) 1000 MG CPDR   Oral   Take 1 capsule by mouth every evening.          . ondansetron (ZOFRAN) 4 MG tablet   Oral   Take 4 mg by mouth every 8 (eight) hours as needed for nausea or vomiting.         Marland Kitchen Petrolatum-Zinc Oxide (SENSI-CARE PROTECTIVE BARRIER EX)   Apply externally   Apply 1 application topically daily.           . predniSONE (DELTASONE) 5 MG tablet   Oral   Take 10 mg by mouth daily.         . Probiotic Product (PROBIOTIC FORMULA PO)   Oral   Take 1-2 capsules by mouth every morning.          . traMADol (ULTRAM) 50 MG tablet   Oral   Take 1 tablet (50 mg total) by mouth daily.   30 tablet   0   . vitamin A 10000 UNIT capsule   Oral   Take 10,000 Units by mouth once a week.          . vitamin C (ASCORBIC ACID) 500 MG tablet   Oral   Take 500 mg by mouth once a week.          . vitamin E 400 UNIT capsule   Oral   Take 400 Units by mouth every morning.          . zolpidem (AMBIEN) 10 MG tablet   Oral   Take 1 tablet (10 mg total) by mouth at bedtime as needed. FOR SLEEP   30 tablet   0    BP 131/75  Pulse 78  Temp(Src) 98  F (36.7 C) (Oral)  Resp 20  Ht 5' 8"  (1.727 m)  Wt 151 lb (68.493 kg)  BMI 22.96 kg/m2  SpO2 98%  Vital signs normal    Physical Exam  Nursing note and vitals reviewed. Constitutional: She is oriented to person, place, and time. She appears well-developed and  well-nourished.  Non-toxic appearance. She does not appear ill. No distress.  HENT:  Head: Normocephalic and atraumatic.  Right Ear: External ear normal.  Left Ear: External ear normal.  Nose: Nose normal. No mucosal edema or rhinorrhea.  Mouth/Throat: Oropharynx is clear and moist and mucous membranes are normal. No dental abscesses or uvula swelling.  Tongue is dry.  Eyes: Conjunctivae and EOM are normal. Pupils are equal, round, and reactive to light.  Neck: Normal range of motion and full passive range of motion without pain. Neck supple.  Cardiovascular: Normal rate, regular rhythm and normal heart sounds.  Exam reveals no gallop and no friction rub.   No murmur heard. Pulmonary/Chest: Effort normal and breath sounds normal. No respiratory distress. She has no wheezes. She has no rhonchi. She has no rales. She exhibits no tenderness and no crepitus.  Abdominal: Soft. Normal appearance. She exhibits no distension. Bowel sounds are increased. There is tenderness (mild diffuse tenderness, worse in LLQ, moderately in RLQ.). There is no rebound and no guarding.    Musculoskeletal: Normal range of motion. She exhibits no edema and no tenderness.  Moves all extremities well.   Neurological: She is alert and oriented to person, place, and time. She has normal strength. No cranial nerve deficit.  Skin: Skin is warm, dry and intact. No rash noted. No erythema. No pallor.  Psychiatric: She has a normal mood and affect. Her speech is normal and behavior is normal. Her mood appears not anxious.    ED Course  Procedures (including critical care time)  Medications  ciprofloxacin (CIPRO) IVPB 400 mg (400 mg Intravenous New Bag/Given 03/11/13 0106)  metroNIDAZOLE (FLAGYL) IVPB 500 mg (not administered)  sodium chloride 0.9 % bolus 1,000 mL (0 mLs Intravenous Stopped 03/11/13 0105)  ondansetron (ZOFRAN) injection 4 mg (4 mg Intramuscular Given 03/10/13 2314)  morphine 4 MG/ML injection 4 mg (4 mg  Intravenous Given 03/10/13 2314)  iohexol (OMNIPAQUE) 300 MG/ML solution 50 mL (50 mLs Oral Contrast Given 03/11/13 0042)  iohexol (OMNIPAQUE) 300 MG/ML solution 100 mL (100 mLs Intravenous Contrast Given 03/11/13 0043)  loperamide (IMODIUM) capsule 2 mg (2 mg Oral Given 03/11/13 0115)     DIAGNOSTIC STUDIES: Oxygen Saturation is 98% on room air, normal by my interpretation.    COORDINATION OF CARE: 10:33 PM-Discussed treatment plan which includes CT adbomen with pt at bedside and pt agreed to plan.   Pt continues to have episodes of diarrhea while waiting for her CT scan.   Dr Florina Ou will discuss admission with hospitalist.   Labs Review Results for orders placed during the hospital encounter of 03/10/13  CBC WITH DIFFERENTIAL      Result Value Range   WBC 10.6 (*) 4.0 - 10.5 K/uL   RBC 4.22  3.87 - 5.11 MIL/uL   Hemoglobin 12.3  12.0 - 15.0 g/dL   HCT 37.8  36.0 - 46.0 %   MCV 89.6  78.0 - 100.0 fL   MCH 29.1  26.0 - 34.0 pg   MCHC 32.5  30.0 - 36.0 g/dL   RDW 14.7  11.5 - 15.5 %   Platelets 380  150 - 400 K/uL   Neutrophils  Relative % 69  43 - 77 %   Neutro Abs 7.4  1.7 - 7.7 K/uL   Lymphocytes Relative 23  12 - 46 %   Lymphs Abs 2.5  0.7 - 4.0 K/uL   Monocytes Relative 6  3 - 12 %   Monocytes Absolute 0.7  0.1 - 1.0 K/uL   Eosinophils Relative 1  0 - 5 %   Eosinophils Absolute 0.1  0.0 - 0.7 K/uL   Basophils Relative 0  0 - 1 %   Basophils Absolute 0.0  0.0 - 0.1 K/uL  COMPREHENSIVE METABOLIC PANEL      Result Value Range   Sodium 136  135 - 145 mEq/L   Potassium 3.6  3.5 - 5.1 mEq/L   Chloride 97  96 - 112 mEq/L   CO2 27  19 - 32 mEq/L   Glucose, Bld 120 (*) 70 - 99 mg/dL   BUN 20  6 - 23 mg/dL   Creatinine, Ser 1.32 (*) 0.50 - 1.10 mg/dL   Calcium 8.8  8.4 - 10.5 mg/dL   Total Protein 6.5  6.0 - 8.3 g/dL   Albumin 3.2 (*) 3.5 - 5.2 g/dL   AST 18  0 - 37 U/L   ALT 13  0 - 35 U/L   Alkaline Phosphatase 47  39 - 117 U/L   Total Bilirubin 0.6  0.3 - 1.2 mg/dL    GFR calc non Af Amer 40 (*) >90 mL/min   GFR calc Af Amer 46 (*) >90 mL/min   Laboratory interpretation all normal except renal insuffic   Imaging Review Ct Abdomen Pelvis W Contrast  03/11/2013   CLINICAL DATA:  72 year old female with abdominal and pelvic pain. History of ulcerative colitis.  EXAM: CT ABDOMEN AND PELVIS WITH CONTRAST  TECHNIQUE: Multidetector CT imaging of the abdomen and pelvis was performed using the standard protocol following bolus administration of intravenous contrast.  CONTRAST:  100 cc intravenous Omnipaque 300  COMPARISON:  04/30/2011 and prior CTs dating back to 12/01/2006  FINDINGS: There is mild circumferential wall thickening of the majority of the colon. Wall thickening is increased involving the sigmoid colon with mild adjacent inflammation.  There is no evidence of bowel obstruction, pneumoperitoneum or abscess.  The liver, spleen, adrenal glands, pancreas and kidneys are unremarkable except for bilateral renal cortical atrophy.  Patient is status post cholecystectomy and hysterectomy.  There is no evidence of free fluid, enlarged lymph nodes, biliary dilation or abdominal aortic aneurysm.  The bladder and adnexal regions are within normal limits.  No acute or suspicious bony abnormalities are identified.  IMPRESSION: Circumferential wall thickening of the majority of the colon, greatest in the sigmoid colon with adjacent inflammation. This is compatible with is patient's known ulcerative colitis. No evidence of pneumoperitoneum, abscess or bowel obstruction.   Electronically Signed   By: Hassan Rowan M.D.   On: 03/11/2013 01:29   04/30/11 Clinical Data: 72 year old female with weakness, nausea, fever,  abdominal pain. History of ulcerative colitis.  CT ABDOMEN AND PELVIS WITH CONTRAST  Comparison: 03/06/2010.  IMPRESSION:  1. Large bowel wall thickening throughout the entire left colon  and rectosigmoid colon may reflect the history of inflammatory  bowel disease.  Evidence of superimposed acute diverticulitis of  the sigmoid colon without complicating features.  2. No other acute findings.  Original Report Authenticated By: Randall An, M.D.        EKG Interpretation   None  MDM   1. Ulcerative colitis   2. Diarrhea   3. Renal insufficiency   4. Dehydration     Plan admission Rolland Porter, MD, FACEP   I personally performed the services described in this documentation, which was scribed in my presence. The recorded information has been reviewed and considered.  Rolland Porter, MD, FACEP   Janice Norrie, MD 03/11/13 [phone]

## 2013-03-10 NOTE — Telephone Encounter (Signed)
Xanax called to [place].

## 2013-03-10 NOTE — ED Notes (Signed)
Pt in by ems, states history of ulcerative colitis,  Reports abd pain for several days worse than usual.

## 2013-03-11 ENCOUNTER — Encounter (HOSPITAL_COMMUNITY): Payer: Self-pay | Admitting: *Deleted

## 2013-03-11 DIAGNOSIS — F329 Major depressive disorder, single episode, unspecified: Secondary | ICD-10-CM

## 2013-03-11 DIAGNOSIS — R112 Nausea with vomiting, unspecified: Secondary | ICD-10-CM

## 2013-03-11 DIAGNOSIS — E860 Dehydration: Secondary | ICD-10-CM | POA: Diagnosis present

## 2013-03-11 DIAGNOSIS — R197 Diarrhea, unspecified: Secondary | ICD-10-CM

## 2013-03-11 DIAGNOSIS — K5190 Ulcerative colitis, unspecified, without complications: Secondary | ICD-10-CM

## 2013-03-11 DIAGNOSIS — R079 Chest pain, unspecified: Secondary | ICD-10-CM

## 2013-03-11 DIAGNOSIS — E876 Hypokalemia: Secondary | ICD-10-CM | POA: Diagnosis present

## 2013-03-11 LAB — COMPREHENSIVE METABOLIC PANEL
ALT: 10 U/L (ref 0–35)
AST: 14 U/L (ref 0–37)
BUN: 18 mg/dL (ref 6–23)
CO2: 26 mEq/L (ref 19–32)
Calcium: 7.7 mg/dL — ABNORMAL LOW (ref 8.4–10.5)
Chloride: 96 mEq/L (ref 96–112)
GFR calc Af Amer: 43 mL/min — ABNORMAL LOW (ref >90)
GFR calc non Af Amer: 37 mL/min — ABNORMAL LOW (ref >90)
Glucose, Bld: 121 mg/dL — ABNORMAL HIGH (ref 70–99)
Potassium: 2.7 mEq/L — CL (ref 3.5–5.1)
Sodium: 134 mEq/L — ABNORMAL LOW (ref 135–145)

## 2013-03-11 LAB — CBC
HCT: 33.1 % — ABNORMAL LOW (ref 36.0–46.0)
MCH: 29.1 pg (ref 26.0–34.0)
Platelets: 328 10*3/uL (ref 150–400)
RBC: 3.58 MIL/uL — ABNORMAL LOW (ref 3.87–5.11)
WBC: 18.1 10*3/uL — ABNORMAL HIGH (ref 4.0–10.5)

## 2013-03-11 LAB — CLOSTRIDIUM DIFFICILE BY PCR: Toxigenic C. Difficile by PCR: NEGATIVE

## 2013-03-11 MED ORDER — IOHEXOL 300 MG/ML  SOLN
100.0000 mL | Freq: Once | INTRAMUSCULAR | Status: AC | PRN
  Administered 2013-03-11: 100 mL via INTRAVENOUS

## 2013-03-11 MED ORDER — SODIUM CHLORIDE 0.9 % IV SOLN: INTRAVENOUS | Status: DC

## 2013-03-11 MED ORDER — METHYLPREDNISOLONE SODIUM SUCC 125 MG IJ SOLR
60.0000 mg | Freq: Four times a day (QID) | INTRAMUSCULAR | Status: DC
  Administered 2013-03-11 – 2013-03-12 (×6): 60 mg via INTRAVENOUS
  Filled 2013-03-11 (×7): qty 2

## 2013-03-11 MED ORDER — ESCITALOPRAM OXALATE 10 MG PO TABS
20.0000 mg | ORAL_TABLET | Freq: Every day | ORAL | Status: DC
  Administered 2013-03-11 – 2013-03-15 (×5): 20 mg via ORAL
  Filled 2013-03-11 (×5): qty 2

## 2013-03-11 MED ORDER — LOPERAMIDE HCL 2 MG PO CAPS: 2.0000 mg | ORAL_CAPSULE | Freq: Three times a day (TID) | ORAL | Status: DC | PRN

## 2013-03-11 MED ORDER — ACETAMINOPHEN 650 MG RE SUPP: 650.0000 mg | Freq: Four times a day (QID) | RECTAL | Status: DC | PRN

## 2013-03-11 MED ORDER — ACETAMINOPHEN 325 MG PO TABS
650.0000 mg | ORAL_TABLET | Freq: Four times a day (QID) | ORAL | Status: DC | PRN
  Administered 2013-03-11: 650 mg via ORAL
  Filled 2013-03-11: qty 2

## 2013-03-11 MED ORDER — POTASSIUM CHLORIDE CRYS ER 20 MEQ PO TBCR
40.0000 meq | EXTENDED_RELEASE_TABLET | ORAL | Status: AC
  Administered 2013-03-11 (×3): 40 meq via ORAL
  Filled 2013-03-11 (×3): qty 2

## 2013-03-11 MED ORDER — IOHEXOL 300 MG/ML  SOLN
50.0000 mL | Freq: Once | INTRAMUSCULAR | Status: AC | PRN
  Administered 2013-03-11: 50 mL via ORAL

## 2013-03-11 MED ORDER — METRONIDAZOLE IN NACL 5-0.79 MG/ML-% IV SOLN
500.0000 mg | Freq: Three times a day (TID) | INTRAVENOUS | Status: DC
  Administered 2013-03-11 – 2013-03-15 (×13): 500 mg via INTRAVENOUS
  Filled 2013-03-11 (×19): qty 100

## 2013-03-11 MED ORDER — MAGNESIUM SULFATE 40 MG/ML IJ SOLN
4.0000 g | Freq: Once | INTRAMUSCULAR | Status: AC
  Administered 2013-03-11: 4 g via INTRAVENOUS
  Filled 2013-03-11: qty 100

## 2013-03-11 MED ORDER — MORPHINE SULFATE 2 MG/ML IJ SOLN
1.0000 mg | INTRAMUSCULAR | Status: DC | PRN
  Administered 2013-03-11 – 2013-03-15 (×18): 1 mg via INTRAVENOUS
  Filled 2013-03-11 (×19): qty 1

## 2013-03-11 MED ORDER — ALPRAZOLAM 0.5 MG PO TABS
0.5000 mg | ORAL_TABLET | Freq: Two times a day (BID) | ORAL | Status: DC | PRN
  Administered 2013-03-11: 0.5 mg via ORAL
  Filled 2013-03-11: qty 1

## 2013-03-11 MED ORDER — MORPHINE SULFATE 4 MG/ML IJ SOLN
4.0000 mg | Freq: Once | INTRAMUSCULAR | Status: AC
  Administered 2013-03-11: 4 mg via INTRAVENOUS
  Filled 2013-03-11: qty 1

## 2013-03-11 MED ORDER — POTASSIUM CHLORIDE IN NACL 40-0.9 MEQ/L-% IV SOLN
INTRAVENOUS | Status: DC
  Administered 2013-03-11: 13:00:00 via INTRAVENOUS
  Administered 2013-03-12: 100 mL/h via INTRAVENOUS

## 2013-03-11 MED ORDER — LOPERAMIDE HCL 2 MG PO CAPS
2.0000 mg | ORAL_CAPSULE | Freq: Once | ORAL | Status: AC
  Administered 2013-03-11: 2 mg via ORAL
  Filled 2013-03-11: qty 1

## 2013-03-11 MED ORDER — ZOLPIDEM TARTRATE 5 MG PO TABS
5.0000 mg | ORAL_TABLET | Freq: Every evening | ORAL | Status: DC | PRN
  Administered 2013-03-15: 5 mg via ORAL
  Filled 2013-03-11: qty 1

## 2013-03-11 NOTE — ED Notes (Signed)
Pt constantly up and down to restroom since ct scan.

## 2013-03-11 NOTE — Progress Notes (Addendum)
Patient admitted by Dr. Darrick Meigs earlier this morning.  Patient seen and examined.  She continues to have some abdominal pain, nausea vomiting, and some loose stools. Her symptoms are full to be related to her Crohn's disease. She is on IV steroids as well as antibiotics. Stool for C. difficile has been negative. We'll continue current treatments for now. Anticipate tapering steroids tomorrow. Her leukocytosis is most likely from IV steroids. We'll replace potassium in order followup level later this evening.  MEMON,JEHANZEB

## 2013-03-11 NOTE — Progress Notes (Signed)
Utilization Review completed.  

## 2013-03-11 NOTE — H&P (Addendum)
PCP:   Jules Schick, NP   Chief Complaint:  Diarrhea  HPI:  72 year old female who  has a past medical history of Hypertension; Ulcerative colitis; Fibromyalgia; Ulcerative colitis; Rectal bleeding; Diarrhea; Hyperglycemia, drug-induced (05/02/2011); Anemia due to blood loss (05/02/2011); and Hypokalemia. He presented to the ED with ongoing diarrhea which got worse tonight. Patient says that she has seen Dr. Corbin Ade 2 days ago at that time she had bloody diarrhea. Her dose of prednisone was changed from 10 mg daily to 25 mg daily. She says that bleeding has stopped since then but she continued to have diarrhea. Usually she was having 8-10 loose bowel movements everyday for past 2 weeks. Since she came to ED patient had almost 15 loose bowel movements. She also complains of abdominal pain, associated left lower quadrant. Patient also had brief episode of chest pain at home which lasted about 15 minutes and she took 2 tablets of aspirin for that. She also admits to having mild shortness of breath she denies any radiation of of the pain no sweating. She denies nausea vomiting. In the ED CT abdomen pelvis done, Which showed severe ulcerative colitis.  Allergies:   Allergies  Allergen Reactions  . Azathioprine Nausea And Vomiting and Other (See Comments)    SEVERE NAUSEA AND VOMITING WITH CHEST PAIN-  PATIENT INSISTS NEVER TO BE GIVEN ANYTHING SIMILAR TO THIS  . Tape Other (See Comments)    BLISTERS AND SKIN TEARING  . Codeine Nausea And Vomiting  . Penicillins Hives  . Povidone Hives    BETADINE  . Procaine Hcl Other (See Comments)    SEVERE GI UPSET (NAUSEA,VOMITING AND DIARRHEA)  . Sulfonamide Derivatives Hives  . Azithromycin     Mycins   . Dairy Aid [Lactase]   . Latex Rash  . Niacin And Related Rash      Past Medical History  Diagnosis Date  . Hypertension   . Ulcerative colitis   . Fibromyalgia   . Ulcerative colitis   . Rectal bleeding   . Diarrhea   . Hyperglycemia,  drug-induced 05/02/2011  . Anemia due to blood loss 05/02/2011  . Hypokalemia     Past Surgical History  Procedure Laterality Date  . Cholecystectomy      s/p  . Colonoscopy  06/16/06. 04/27/2007    proctitis seen, biopsies showed chronic active colitis, no dysplasia   . Abdominal hysterectomy      Prior to Admission medications   Medication Sig Start Date End Date Taking? Authorizing Provider  acetaminophen (TYLENOL) 500 MG tablet Take 500-1,000 mg by mouth every 6 (six) hours as needed. FOR PAIN   Yes Historical Provider, MD  ALPRAZolam (XANAX) 0.5 MG tablet Take 1 tablet (0.5 mg total) by mouth 2 (two) times daily as needed. FOR ANXIETY 03/08/13  Yes Mary-Margaret Hassell Done, FNP  antiseptic oral rinse (BIOTENE) LIQD 1 application by Mouth Rinse route as needed. FOR DRY MOUTH    Yes Historical Provider, MD  B Complex CAPS Take 1 capsule by mouth every morning.    Yes Historical Provider, MD  benazepril-hydrochlorthiazide (LOTENSIN HCT) 20-12.5 MG per tablet Take 1 tablet by mouth daily.   Yes Historical Provider, MD  Cholecalciferol (VITAMIN D3) 1000 UNITS CAPS Take 1 capsule by mouth every morning.    Yes Historical Provider, MD  co-enzyme Q-10 50 MG capsule Take 50 mg by mouth once a week.    Yes Historical Provider, MD  escitalopram (LEXAPRO) 20 MG tablet Take 20 mg by mouth  daily. 12/24/12  Yes Historical Provider, MD  Hydrocodone-Acetaminophen 5-300 MG TABS Take 1 tablet by mouth 2 (two) times daily as needed. 03/09/13  Yes Mary-Margaret Hassell Done, FNP  Multiple Vitamins-Minerals (WOMENS MULTI VITAMIN & MINERAL PO) Take 1 tablet by mouth every other day.    Yes Historical Provider, MD  Omega-3 Fatty Acids (FISH OIL) 1000 MG CPDR Take 1 capsule by mouth every evening.    Yes Historical Provider, MD  ondansetron (ZOFRAN) 4 MG tablet Take 4 mg by mouth every 8 (eight) hours as needed for nausea or vomiting.   Yes Historical Provider, MD  Petrolatum-Zinc Oxide (SENSI-CARE PROTECTIVE BARRIER EX)  Apply 1 application topically daily.     Yes Historical Provider, MD  predniSONE (DELTASONE) 5 MG tablet Take 10 mg by mouth daily.   Yes Historical Provider, MD  Probiotic Product (PROBIOTIC FORMULA PO) Take 1-2 capsules by mouth every morning.    Yes Historical Provider, MD  traMADol (ULTRAM) 50 MG tablet Take 1 tablet (50 mg total) by mouth daily. 02/08/13  Yes Mary-Margaret Hassell Done, FNP  vitamin A 10000 UNIT capsule Take 10,000 Units by mouth once a week.    Yes Historical Provider, MD  vitamin C (ASCORBIC ACID) 500 MG tablet Take 500 mg by mouth once a week.    Yes Historical Provider, MD  vitamin E 400 UNIT capsule Take 400 Units by mouth every morning.    Yes Historical Provider, MD  zolpidem (AMBIEN) 10 MG tablet Take 1 tablet (10 mg total) by mouth at bedtime as needed. FOR SLEEP 03/09/13  Yes Mary-Margaret Hassell Done, FNP    Social History:  reports that she has never smoked. She does not have any smokeless tobacco history on file. She reports that she drinks alcohol. She reports that she does not use illicit drugs.    All the positives are listed in BOLD  Review of Systems:  HEENT: Headache, blurred vision, runny nose, sore throat Neck: Hypothyroidism, hyperthyroidism,,lymphadenopathy Chest : Shortness of breath, history of COPD, Asthma Heart : Chest pain, history of coronary arterey disease GI:  Nausea, vomiting, diarrhea, constipation, GERD GU: Dysuria, urgency, frequency of urination, hematuria Neuro: Stroke, seizures, syncope Psych: Depression, anxiety, hallucinations   Physical Exam: Blood pressure 140/73, pulse 82, temperature 98 F (36.7 C), temperature source Oral, resp. rate 20, height 5' 8"  (1.727 m), weight 68.493 kg (151 lb), SpO2 96.00%. Constitutional:   Patient is a well-developed and well-nourished female  in no acute distress and cooperative with exam. Head: Normocephalic and atraumatic Mouth: Mucus membranes moist Eyes: PERRL, EOMI, conjunctivae normal Neck:  Supple, No Thyromegaly Cardiovascular: RRR, S1 normal, S2 normal Pulmonary/Chest: CTAB, no wheezes, rales, or rhonchi Abdominal: Soft. Non-tender, non-distended, bowel sounds are normal, no masses, organomegaly, or guarding present.  Neurological: A&O x3, Strenght is normal and symmetric bilaterally, cranial nerve II-XII are grossly intact, no focal motor deficit, sensory intact to light touch bilaterally.  Extremities : No Cyanosis, Clubbing or Edema   Labs on Admission:  Results for orders placed during the hospital encounter of 03/10/13 (from the past 48 hour(s))  CBC WITH DIFFERENTIAL     Status: Abnormal   Collection Time    03/10/13 10:30 PM      Result Value Range   WBC 10.6 (*) 4.0 - 10.5 K/uL   RBC 4.22  3.87 - 5.11 MIL/uL   Hemoglobin 12.3  12.0 - 15.0 g/dL   HCT 37.8  36.0 - 46.0 %   MCV 89.6  78.0 - 100.0 fL  MCH 29.1  26.0 - 34.0 pg   MCHC 32.5  30.0 - 36.0 g/dL   RDW 14.7  11.5 - 15.5 %   Platelets 380  150 - 400 K/uL   Neutrophils Relative % 69  43 - 77 %   Neutro Abs 7.4  1.7 - 7.7 K/uL   Lymphocytes Relative 23  12 - 46 %   Lymphs Abs 2.5  0.7 - 4.0 K/uL   Monocytes Relative 6  3 - 12 %   Monocytes Absolute 0.7  0.1 - 1.0 K/uL   Eosinophils Relative 1  0 - 5 %   Eosinophils Absolute 0.1  0.0 - 0.7 K/uL   Basophils Relative 0  0 - 1 %   Basophils Absolute 0.0  0.0 - 0.1 K/uL  COMPREHENSIVE METABOLIC PANEL     Status: Abnormal   Collection Time    03/10/13 10:30 PM      Result Value Range   Sodium 136  135 - 145 mEq/L   Potassium 3.6  3.5 - 5.1 mEq/L   Chloride 97  96 - 112 mEq/L   CO2 27  19 - 32 mEq/L   Glucose, Bld 120 (*) 70 - 99 mg/dL   BUN 20  6 - 23 mg/dL   Creatinine, Ser 1.32 (*) 0.50 - 1.10 mg/dL   Calcium 8.8  8.4 - 10.5 mg/dL   Total Protein 6.5  6.0 - 8.3 g/dL   Albumin 3.2 (*) 3.5 - 5.2 g/dL   AST 18  0 - 37 U/L   ALT 13  0 - 35 U/L   Alkaline Phosphatase 47  39 - 117 U/L   Total Bilirubin 0.6  0.3 - 1.2 mg/dL   GFR calc non Af Amer 40  (*) >90 mL/min   GFR calc Af Amer 46 (*) >90 mL/min   Comment: (NOTE)     The eGFR has been calculated using the CKD EPI equation.     This calculation has not been validated in all clinical situations.     eGFR's persistently <90 mL/min signify possible Chronic Kidney     Disease.    Radiological Exams on Admission: Ct Abdomen Pelvis W Contrast  03/11/2013   CLINICAL DATA:  72 year old female with abdominal and pelvic pain. History of ulcerative colitis.  EXAM: CT ABDOMEN AND PELVIS WITH CONTRAST  TECHNIQUE: Multidetector CT imaging of the abdomen and pelvis was performed using the standard protocol following bolus administration of intravenous contrast.  CONTRAST:  100 cc intravenous Omnipaque 300  COMPARISON:  04/30/2011 and prior CTs dating back to 12/01/2006  FINDINGS: There is mild circumferential wall thickening of the majority of the colon. Wall thickening is increased involving the sigmoid colon with mild adjacent inflammation.  There is no evidence of bowel obstruction, pneumoperitoneum or abscess.  The liver, spleen, adrenal glands, pancreas and kidneys are unremarkable except for bilateral renal cortical atrophy.  Patient is status post cholecystectomy and hysterectomy.  There is no evidence of free fluid, enlarged lymph nodes, biliary dilation or abdominal aortic aneurysm.  The bladder and adnexal regions are within normal limits.  No acute or suspicious bony abnormalities are identified.  IMPRESSION: Circumferential wall thickening of the majority of the colon, greatest in the sigmoid colon with adjacent inflammation. This is compatible with is patient's known ulcerative colitis. No evidence of pneumoperitoneum, abscess or bowel obstruction.   Electronically Signed   By: Hassan Rowan M.D.   On: 03/11/2013 01:29    Assessment/Plan Principal Problem:  Ulcerative colitis Active Problems:   HYPERTENSION   Depression   Chest pain   72 year old female with history of ulcerative colitis  now been admitted with flareup of ulcerative colitis causing bloody diarrhea.  Will also check stool for C diff. Patient will be  started on high-dose steroids Solu-Medrol 60 mg IV every 6 hours. We'll also start empirically on antibiotics Flagyl 500 mg IV every 8 hours. [facility] consult gastroenterology in the morning. Patient has mild dehydration due to ongoing diarrhea will start her on IV normal saline and check BMP in the morning. We'll continue the home medications for hypertension and depression. Patient also complained of chest pain at home which lasted for 15 minutes so we'll obtain EKG and 3 sets of cardiac enzymes. Patient does not have chest pain at this time. We'll give SCDs for DVT prophylaxis.  Code status:Patient is full code   Family discussion:Discussed with patient in detail no family bedside   Time Spent on Admission: *20 minutes   [place] [facility] Pager: [phone] 03/11/2013, 2:41 AM  If 7PM-7AM, please contact night-coverage  [email or web address]  Password [email or web address]

## 2013-03-11 NOTE — Consult Note (Addendum)
Referring Provider: Dr. Darrick Meigs  Primary Care Physician:  Jules Schick, NP Primary Gastroenterologist:  Dr. [name] Reason for Consultation:  colitis   HPI:  Pleasant 72 year old lady diagnosed with the left sided proctocolitis about 6 years ago, maintained primarily on prednisone over the past few years admitted to the hospital yesterday with a four-week history of somewhat worsening abdominal pain and worsening of chronic diarrhea with frequent bloody stools. CT scan demonstrated more for pain and colitis but predominantly left sided. She's been admitted to the hospital started on IV Flagyl, Cipro and high-dose Solu-Medrol. Previously tried on mesalamine preparations as she reports.  not on any of that medication now. Took 3 doses of Remicade  - states it did not work. Treed azathioprine but developed nausea and vomiting and had to be admitted to the hospital in [place].  Seen in Dr. Otelia Limes office recently  - her prednisone regimen was increased at that time. 2 years ago she had a colonoscopy which revealed a predominantly proctitis with distal sigmoid inflammation. CT scan in 2013 demonstrated more extensive colitis than suggested on the colonoscopy one year prior.  Patient adamantly denis any nonsteroidal agents. She recently suffered a bout of trigeminal shingles  - she ended up with head lice before that from her grandson as she reports. Significant lower extremity cellulitis a couple months ago for which she took antibiotics.  Patient states blood per rectum tapered off over the past week or so. Hemoglobin overnight 12 to 10. White count has gone from 10-18,000.  States she's lost 9 pounds recently.  Past Medical History  Diagnosis Date  . Hypertension   . Ulcerative colitis   . Fibromyalgia   . Ulcerative colitis   . Rectal bleeding   . Diarrhea   . Hyperglycemia, drug-induced 05/02/2011  . Anemia due to blood loss 05/02/2011  . Hypokalemia     Past Surgical History  Procedure  Laterality Date  . Cholecystectomy      s/p  . Colonoscopy  06/16/06. 04/27/2007    proctitis seen, biopsies showed chronic active colitis, no dysplasia   . Abdominal hysterectomy      Prior to Admission medications   Medication Sig Start Date End Date Taking? Authorizing Provider  acetaminophen (TYLENOL) 500 MG tablet Take 500-1,000 mg by mouth every 6 (six) hours as needed. FOR PAIN   Yes Historical Provider, MD  ALPRAZolam (XANAX) 0.5 MG tablet Take 1 tablet (0.5 mg total) by mouth 2 (two) times daily as needed. FOR ANXIETY 03/08/13  Yes Mary-Margaret Hassell Done, FNP  antiseptic oral rinse (BIOTENE) LIQD 1 application by Mouth Rinse route as needed. FOR DRY MOUTH    Yes Historical Provider, MD  B Complex CAPS Take 1 capsule by mouth every morning.    Yes Historical Provider, MD  benazepril-hydrochlorthiazide (LOTENSIN HCT) 20-12.5 MG per tablet Take 1 tablet by mouth daily.   Yes Historical Provider, MD  Cholecalciferol (VITAMIN D3) 1000 UNITS CAPS Take 1 capsule by mouth every morning.    Yes Historical Provider, MD  co-enzyme Q-10 50 MG capsule Take 50 mg by mouth once a week.    Yes Historical Provider, MD  escitalopram (LEXAPRO) 20 MG tablet Take 20 mg by mouth daily. 12/24/12  Yes Historical Provider, MD  Hydrocodone-Acetaminophen 5-300 MG TABS Take 1 tablet by mouth 2 (two) times daily as needed. 03/09/13  Yes Mary-Margaret Hassell Done, FNP  Multiple Vitamins-Minerals (WOMENS MULTI VITAMIN & MINERAL PO) Take 1 tablet by mouth every other day.    Yes  Historical Provider, MD  Omega-3 Fatty Acids (FISH OIL) 1000 MG CPDR Take 1 capsule by mouth every evening.    Yes Historical Provider, MD  ondansetron (ZOFRAN) 4 MG tablet Take 4 mg by mouth every 8 (eight) hours as needed for nausea or vomiting.   Yes Historical Provider, MD  Petrolatum-Zinc Oxide (SENSI-CARE PROTECTIVE BARRIER EX) Apply 1 application topically daily.     Yes Historical Provider, MD  predniSONE (DELTASONE) 5 MG tablet Take 10 mg  by mouth daily.   Yes Historical Provider, MD  Probiotic Product (PROBIOTIC FORMULA PO) Take 1-2 capsules by mouth every morning.    Yes Historical Provider, MD  traMADol (ULTRAM) 50 MG tablet Take 1 tablet (50 mg total) by mouth daily. 02/08/13  Yes Mary-Margaret Hassell Done, FNP  vitamin A 10000 UNIT capsule Take 10,000 Units by mouth once a week.    Yes Historical Provider, MD  vitamin C (ASCORBIC ACID) 500 MG tablet Take 500 mg by mouth once a week.    Yes Historical Provider, MD  vitamin E 400 UNIT capsule Take 400 Units by mouth every morning.    Yes Historical Provider, MD  zolpidem (AMBIEN) 10 MG tablet Take 1 tablet (10 mg total) by mouth at bedtime as needed. FOR SLEEP 03/09/13  Yes Mary-Margaret Hassell Done, FNP    Current Facility-Administered Medications  Medication Dose Route Frequency Provider Last Rate Last Dose  . 0.9 %  sodium chloride infusion   Intravenous Continuous Oswald Hillock, MD      . acetaminophen (TYLENOL) tablet 650 mg  650 mg Oral Q6H PRN Oswald Hillock, MD   650 mg at 03/11/13 0804   Or  . acetaminophen (TYLENOL) suppository 650 mg  650 mg Rectal Q6H PRN Oswald Hillock, MD      . ALPRAZolam Duanne Moron) tablet 0.5 mg  0.5 mg Oral BID PRN Oswald Hillock, MD      . escitalopram (LEXAPRO) tablet 20 mg  20 mg Oral Daily Oswald Hillock, MD   20 mg at 03/11/13 2633  . loperamide (IMODIUM) capsule 2 mg  2 mg Oral Q8H PRN Oswald Hillock, MD      . methylPREDNISolone sodium succinate (SOLU-MEDROL) 125 mg/2 mL injection 60 mg  60 mg Intravenous Q6H Oswald Hillock, MD   60 mg at 03/11/13 0852  . metroNIDAZOLE (FLAGYL) IVPB 500 mg  500 mg Intravenous Q8H Oswald Hillock, MD      . morphine 2 MG/ML injection 1 mg  1 mg Intravenous Q4H PRN Kathie Dike, MD   1 mg at 03/11/13 3545  . zolpidem (AMBIEN) tablet 5 mg  5 mg Oral QHS PRN Oswald Hillock, MD        Allergies as of 03/10/2013 - Review Complete 03/10/2013  Allergen Reaction Noted  . Azathioprine Nausea And Vomiting and Other (See Comments)  07/27/2007  . Tape Other (See Comments) 11/04/2010  . Codeine Nausea And Vomiting 07/27/2007  . Penicillins Hives 07/27/2007  . Povidone Hives 07/27/2007  . Procaine hcl Other (See Comments) 11/04/2010  . Sulfonamide derivatives Hives 07/27/2007  . Azithromycin  08/01/2012  . Dairy aid [lactase]  04/30/2011  . Latex Rash   . Niacin and related Rash 11/04/2010    History reviewed. No pertinent family history.  History   Social History  . Marital Status: Widowed    Spouse Name: N/A    Number of Children: N/A  . Years of Education: N/A   Occupational History  . Not  on file.   Social History Main Topics  . Smoking status: Never Smoker   . Smokeless tobacco: Not on file  . Alcohol Use: Yes     Comment: Ocassionally a small glass of wine. Patient states that is has been 2 months since she drank anything.  . Drug Use: No  . Sexual Activity: No   Other Topics Concern  . Not on file   Social History Narrative  . No narrative on file    Review of Systems: 1. Gen: weight loss, and sleep disorder CV: Denies chest pain, angina, palpitations, syncope, orthopnea, PND, peripheral edema, and claudication. Resp: Denies dyspnea at rest, dyspnea with exercise, cough, sputum, wheezing, coughing up blood, and pleurisy. GI: Denies vomiting blood, jaundice, and fecal incontinence.   Denies dysphagia or odynophagia. Derm: Denies rash, itching, dry skin, hives, moles, warts, or unhealing ulcers.  Heme: Denies bruising, bleeding, and enlarged lymph nodes.   Physical Exam: Vital signs in last 24 hours: Temp:  [97.5 F (36.4 C)-98.2 F (36.8 C)] 98.2 F (36.8 C) (12/20 0556) Pulse Rate:  [76-83] 76 (12/20 0556) Resp:  [18-20] 20 (12/20 0556) BP: (118-140)/(65-75) 118/68 mmHg (12/20 0556) SpO2:  [96 %-100 %] 98 % (12/20 0556) Weight:  [151 lb (68.493 kg)-165 lb 5.5 oz (75 kg)] 165 lb 5.5 oz (75 kg) (12/20 0320) Last BM Date: 03/11/13 General:   Alert,  conversant, appears somewhat  miserable.  Head:  Normocephalic and atraumatic. Eyes:  Sclera clear, no icterus.   Conjunctiva pink. Ears:  Normal auditory acuity. Nose:  No deformity, discharge,  or lesions. Mouth:  No deformity or lesions, dentition normal. Neck:  Supple; no masses or thyromegaly. Lungs:  Clear throughout to auscultation.   No wheezes, crackles, or rhonchi. No acute distress. Heart:  Regular rate and rhythm; no murmurs, clicks, rubs,  or gallops. Abdomen:   Nondistended. She has bowel sounds. She has a diffuse left-sided abdominal tenderness to palpation with voluntary guarding. No rebound. She is less tender diffusely on the right side. No obvious mass or organomegaly Rectal:  Deferred until time of colonoscopy.   Msk:  Symmetrical without gross deformities. Normal posture. Pulses:  Normal pulses noted. Extremities:  Without clubbing or edema. Neurologic:  Alert and  oriented x4;  grossly normal neurologically. Skin:  Intact without significant lesions or rashes. Cervical Nodes:  No significant cervical adenopathy. Psych:  Alert and cooperative. Normal mood and affect.  Intake/Output from previous day:   Intake/Output this shift: Total I/O In: -  Out: 200 [Urine:200]  Lab Results:  Recent Labs  03/10/13 2230 03/11/13 0459  WBC 10.6* 18.1*  HGB 12.3 10.4*  HCT 37.8 33.1*  PLT 380 328   BMET  Recent Labs  03/10/13 2230 03/11/13 0459  NA 136 134*  K 3.6 2.7*  CL 97 96  CO2 27 26  GLUCOSE 120* 121*  BUN 20 18  CREATININE 1.32* 1.40*  CALCIUM 8.8 7.7*   LFT  Recent Labs  03/11/13 0459  PROT 5.5*  ALBUMIN 2.8*  AST 14  ALT 10  ALKPHOS 39  BILITOT 1.4*   PT/INR No results found for this basename: LABPROT, INR,  in the last 72 hours Hepatitis Panel No results found for this basename: HEPBSAG, HCVAB, HEPAIGM, HEPBIGM,  in the last 72 hours C-Diff No results found for this basename: CDIFFTOX,  in the last 72 hours  Studies/Results: Ct Abdomen Pelvis W  Contrast  03/11/2013   CLINICAL DATA:  72 year old female with abdominal and  pelvic pain. History of ulcerative colitis.  EXAM: CT ABDOMEN AND PELVIS WITH CONTRAST  TECHNIQUE: Multidetector CT imaging of the abdomen and pelvis was performed using the standard protocol following bolus administration of intravenous contrast.  CONTRAST:  100 cc intravenous Omnipaque 300  COMPARISON:  04/30/2011 and prior CTs dating back to 12/01/2006  FINDINGS: There is mild circumferential wall thickening of the majority of the colon. Wall thickening is increased involving the sigmoid colon with mild adjacent inflammation.  There is no evidence of bowel obstruction, pneumoperitoneum or abscess.  The liver, spleen, adrenal glands, pancreas and kidneys are unremarkable except for bilateral renal cortical atrophy.  Patient is status post cholecystectomy and hysterectomy.  There is no evidence of free fluid, enlarged lymph nodes, biliary dilation or abdominal aortic aneurysm.  The bladder and adnexal regions are within normal limits.  No acute or suspicious bony abnormalities are identified.  IMPRESSION: Circumferential wall thickening of the majority of the colon, greatest in the sigmoid colon with adjacent inflammation. This is compatible with is patient's known ulcerative colitis. No evidence of pneumoperitoneum, abscess or bowel obstruction.   Electronically Signed   By: Hassan Rowan M.D.   On: 03/11/2013 01:29    Impression: Pleasant 72 year old lady with now looks like more of a pan of ulcerative colitis chronically managed with prednisone primarily admitted to the hospital with apparent exacerbation in colitis symptoms including diarrhea, abdominal pain and blood per rectum. She has a leukocytosis. Does not appear to be a fulminant process at this time. Exposure to antibiotics not too long ago. Significant hypokalemia secondary to diarrhea.  Differential in this setting includes de novo exacerbation of inflammatory bowel  disease which appears to have evolved into a more pan-colitis over the past couple of years, superimposed infection including C. difficile or CMV with other infectious agents being less likely exacerbating inflammatory bowel disease.   I doubt ischemic colitis superimposed but it would be difficult to tell for sure with significant pre-existing colitis on cross sectional imaging. Certainly, no significant vascular calcifications on current CT with my review.  NSAID insult less likely with no history of use.  Recommendations:  For now, we'll continue IV Solu-Medrol. She is on a hefty dose; may adjust downward in the next 24-48 hours. . Send off stool studies, in particular, for C. difficile and culture. Hypokalemia management per attending.  Will get Dr. Otelia Limes input the first of the week when he returns.

## 2013-03-11 NOTE — Progress Notes (Signed)
CRITICAL VALUE ALERT  Critical value received:  K of 2.7  Date of notification:  03/11/2013 Time of notification:  0657  Critical value read back:yes  Nurse who received alert:  Leroy Kennedy, RN  MD notified (1st page):  Eleonore Chiquito  Time of first page:  [phone]  MD notified (2nd page):  Time of second page:  Responding MD:  Eleonore Chiquito  Time MD responded:  N/A

## 2013-03-12 LAB — CBC
HCT: 33.7 % — ABNORMAL LOW (ref 36.0–46.0)
Hemoglobin: 10.9 g/dL — ABNORMAL LOW (ref 12.0–15.0)
MCHC: 32.3 g/dL (ref 30.0–36.0)
MCHC: 32.9 g/dL (ref 30.0–36.0)
MCV: 90 fL (ref 78.0–100.0)
MCV: 90.8 fL (ref 78.0–100.0)
Platelets: 353 10*3/uL (ref 150–400)
RBC: 3.71 MIL/uL — ABNORMAL LOW (ref 3.87–5.11)
RBC: 3.71 MIL/uL — ABNORMAL LOW (ref 3.87–5.11)
WBC: 12.3 10*3/uL — ABNORMAL HIGH (ref 4.0–10.5)
WBC: 15.5 10*3/uL — ABNORMAL HIGH (ref 4.0–10.5)

## 2013-03-12 LAB — BASIC METABOLIC PANEL
CO2: 22 mEq/L (ref 19–32)
Chloride: 107 mEq/L (ref 96–112)
Potassium: 5.6 mEq/L — ABNORMAL HIGH (ref 3.5–5.1)
Sodium: 137 mEq/L (ref 135–145)

## 2013-03-12 MED ORDER — ALPRAZOLAM 0.25 MG PO TABS: 0.2500 mg | ORAL_TABLET | Freq: Two times a day (BID) | ORAL | Status: DC | PRN

## 2013-03-12 MED ORDER — METHYLPREDNISOLONE SODIUM SUCC 40 MG IJ SOLR
40.0000 mg | Freq: Two times a day (BID) | INTRAMUSCULAR | Status: DC
  Administered 2013-03-12 – 2013-03-13 (×2): 40 mg via INTRAVENOUS
  Filled 2013-03-12 (×2): qty 1

## 2013-03-12 MED ORDER — CIPROFLOXACIN IN D5W 400 MG/200ML IV SOLN
400.0000 mg | Freq: Two times a day (BID) | INTRAVENOUS | Status: DC
  Administered 2013-03-12 – 2013-03-15 (×6): 400 mg via INTRAVENOUS
  Filled 2013-03-12 (×11): qty 200

## 2013-03-12 MED ORDER — SODIUM CHLORIDE 0.9 % IV SOLN
INTRAVENOUS | Status: DC
  Administered 2013-03-12: 09:00:00 via INTRAVENOUS
  Administered 2013-03-12: 1000 mL via INTRAVENOUS
  Administered 2013-03-13: 12:00:00 via INTRAVENOUS

## 2013-03-12 MED ORDER — ALPRAZOLAM 0.25 MG PO TABS
0.2500 mg | ORAL_TABLET | Freq: Two times a day (BID) | ORAL | Status: DC | PRN
  Administered 2013-03-12 – 2013-03-13 (×3): 0.25 mg via ORAL
  Filled 2013-03-12 (×3): qty 1

## 2013-03-12 NOTE — Progress Notes (Signed)
Pt had about 6-7 loose watery stools throughout night. There were intermittent bloody stools. Pt is safe and stable at this time. Will continue to monitor.

## 2013-03-12 NOTE — Progress Notes (Signed)
[facility] PROGRESS NOTE  [name] WGY:[id number] DOB: 08-25-1940 DOA: 03/10/2013 PCP: Jules Schick, NP  Assessment/Plan: 1. Ulcerative colitis. Patient has had some improvement with steroids. Will decrease dosing since she appears to be having significant side effects of this. Continue antibiotics.  Appreciate GI assistance. 2. Bloody stools, intermittent, due to #1, not significant, hemoglobin stable 3. Diarrhea due to #1, stool c diff negative, continue supportive treatments. 4. HTN. Stable 5. Hypokalemia due to GI losses.  Replaced 6. Dehydration. Improving with IV fluids.  Code Status: full code Family Communication: discussed with patient Disposition Plan: discharge home once improved   Consultants:  Gastroenterology  Procedures:  none  Antibiotics:  Cipro,12/20  Flagyl 12/20  HPI/Subjective: Feels jittery, wants to advance diet, feeling a little better overall, continues with loose stools today  Objective: Filed Vitals:   03/12/13 0541  BP: 165/89  Pulse: 81  Temp: 97.6 F (36.4 C)  Resp: 20   No intake or output data in the 24 hours ending 03/12/13 1458 Filed Weights   03/10/13 2102 03/11/13 0320  Weight: 68.493 kg (151 lb) 75 kg (165 lb 5.5 oz)    Exam:   General:  NAD, appears anxious  Cardiovascular: s1, s2, rrr  Respiratory: cta b  Abdomen: soft, tender on left side, bs+  Musculoskeletal: mild edema noted in LE   Data Reviewed: Basic Metabolic Panel:  Recent Labs Lab 03/10/13 2230 03/11/13 0457 03/11/13 0459 03/11/13 1806 03/12/13 0533  NA 136  --  134*  --  137  K 3.6  --  2.7* 5.0 5.6*  CL 97  --  96  --  107  CO2 27  --  26  --  22  GLUCOSE 120*  --  121*  --  206*  BUN 20  --  18  --  17  CREATININE 1.32*  --  1.40*  --  1.17*  CALCIUM 8.8  --  7.7*  --  8.1*  MG  --  1.4*  --   --   --    Liver Function Tests:  Recent Labs Lab 03/10/13 2230 03/11/13 0459  AST 18 14  ALT 13 10  ALKPHOS 47 39   BILITOT 0.6 1.4*  PROT 6.5 5.5*  ALBUMIN 3.2* 2.8*   No results found for this basename: LIPASE, AMYLASE,  in the last 168 hours No results found for this basename: AMMONIA,  in the last 168 hours CBC:  Recent Labs Lab 03/10/13 2230 03/11/13 0459 03/12/13 0008 03/12/13 0533  WBC 10.6* 18.1* 15.5* 12.3*  NEUTROABS 7.4  --   --   --   HGB 12.3 10.4* 11.0* 10.9*  HCT 37.8 33.1* 33.4* 33.7*  MCV 89.6 92.5 90.0 90.8  PLT 380 328 353 326   Cardiac Enzymes:  Recent Labs Lab 03/11/13 0459 03/11/13 0923  TROPONINI <0.30 <0.30   BNP (last 3 results) No results found for this basename: PROBNP,  in the last 8760 hours CBG: No results found for this basename: GLUCAP,  in the last 168 hours  Recent Results (from the past 240 hour(s))  CLOSTRIDIUM DIFFICILE BY PCR     Status: None   Collection Time    03/11/13  4:44 AM      Result Value Range Status   C difficile by pcr NEGATIVE  NEGATIVE Final     Studies: Ct Abdomen Pelvis W Contrast  03/11/2013   CLINICAL DATA:  72 year old female with abdominal and pelvic pain. History of  ulcerative colitis.  EXAM: CT ABDOMEN AND PELVIS WITH CONTRAST  TECHNIQUE: Multidetector CT imaging of the abdomen and pelvis was performed using the standard protocol following bolus administration of intravenous contrast.  CONTRAST:  100 cc intravenous Omnipaque 300  COMPARISON:  04/30/2011 and prior CTs dating back to 12/01/2006  FINDINGS: There is mild circumferential wall thickening of the majority of the colon. Wall thickening is increased involving the sigmoid colon with mild adjacent inflammation.  There is no evidence of bowel obstruction, pneumoperitoneum or abscess.  The liver, spleen, adrenal glands, pancreas and kidneys are unremarkable except for bilateral renal cortical atrophy.  Patient is status post cholecystectomy and hysterectomy.  There is no evidence of free fluid, enlarged lymph nodes, biliary dilation or abdominal aortic aneurysm.  The  bladder and adnexal regions are within normal limits.  No acute or suspicious bony abnormalities are identified.  IMPRESSION: Circumferential wall thickening of the majority of the colon, greatest in the sigmoid colon with adjacent inflammation. This is compatible with is patient's known ulcerative colitis. No evidence of pneumoperitoneum, abscess or bowel obstruction.   Electronically Signed   By: Hassan Rowan M.D.   On: 03/11/2013 01:29    Scheduled Meds: . ciprofloxacin  400 mg Intravenous Q12H  . escitalopram  20 mg Oral Daily  . methylPREDNISolone (SOLU-MEDROL) injection  40 mg Intravenous Q12H  . metronidazole  500 mg Intravenous Q8H   Continuous Infusions: . sodium chloride 100 mL/hr at 03/12/13 0859    Principal Problem:   Ulcerative colitis Active Problems:   HYPERTENSION   Depression   Hypokalemia   Dehydration    Time spent: 60mns    MEMON,JEHANZEB  [facility] Pager [phone] If 7PM-7AM, please contact night-coverage at [email or web address], password [facility]12/21/2014, 2:58 PM  LOS: 2 days

## 2013-03-12 NOTE — Progress Notes (Signed)
Patient with a little more blood per rectum over night. States overall diarrhea improved. States she feels better with less stooling and abdominal pain.  Hungry; wants more to eat.  She is "jittery" with Solu-Medrol Patient seen and discussed with Dr. Roderic Palau   Vital signs in last 24 hours: Temp:  [97.6 F (36.4 C)-98.3 F (36.8 C)] 97.6 F (36.4 C) (12/21 0541) Pulse Rate:  [65-81] 81 (12/21 0541) Resp:  [20] 20 (12/21 0541) BP: (129-165)/(68-89) 165/89 mmHg (12/21 0541) SpO2:  [96 %-98 %] 98 % (12/21 0541) Last BM Date: 03/12/13 General:   Awake and alert appears in no distress but somewhat tremulous Abdomen:  Nondistended. Positive bowel sounds. Soft with less left-sided abdominal tenderness than noted yesterday.   C. difficile toxin assay negative. White count down to 12.3; hemoglobin stable at 10.9   Intake/Output from previous day: 12/20 0701 - 12/21 0700 In: -  Out: 200 [Urine:200] Intake/Output this shift:    Lab Results:  Recent Labs  03/11/13 0459 03/12/13 0008 03/12/13 0533  WBC 18.1* 15.5* 12.3*  HGB 10.4* 11.0* 10.9*  HCT 33.1* 33.4* 33.7*  PLT 328 353 326   BMET  Recent Labs  03/10/13 2230 03/11/13 0459 03/11/13 1806 03/12/13 0533  NA 136 134*  --  137  K 3.6 2.7* 5.0 5.6*  CL 97 96  --  107  CO2 27 26  --  22  GLUCOSE 120* 121*  --  206*  BUN 20 18  --  17  CREATININE 1.32* 1.40*  --  1.17*  CALCIUM 8.8 7.7*  --  8.1*    Impression:   Pan proctocolitis, now clinically improved with 2 days worth of high-dose Solu-Medrol. I note a single dose of Cipro given in the ED but this medication was not continued.  Recommendations:    As discussed with Dr. Roderic Palau, throttle back on Solu-Medrol as she is experiencing side effects. Advance to a low residue diet. Add Cipro to Flagyl for the time being. If she continues to improve, clinically, may not need to scope/biopsy looking for CMV, etc. She would benefit from systemic mesalamine but apparently has not  been on any of these agents for some time, if at all (pt doesn't remember). Would like to get more information regarding her treatment over the years. Dr. [name] to assume care tomorrow morning.

## 2013-03-12 NOTE — Progress Notes (Signed)
Pt has started to have bloody stools as of tonight per pt. NP Harless Litten was notified and ordered CBC to be collected every 6 hours and to notify GI. Dr. Gala Romney was notified and said to notify him if pt's hgb level is below 9. Pt safe and stable otherwise. Will continue to monitor closely.

## 2013-03-13 DIAGNOSIS — D500 Iron deficiency anemia secondary to blood loss (chronic): Secondary | ICD-10-CM

## 2013-03-13 DIAGNOSIS — R197 Diarrhea, unspecified: Secondary | ICD-10-CM

## 2013-03-13 DIAGNOSIS — I10 Essential (primary) hypertension: Secondary | ICD-10-CM

## 2013-03-13 DIAGNOSIS — K5190 Ulcerative colitis, unspecified, without complications: Secondary | ICD-10-CM

## 2013-03-13 LAB — BASIC METABOLIC PANEL
BUN: 19 mg/dL (ref 6–23)
Calcium: 8 mg/dL — ABNORMAL LOW (ref 8.4–10.5)
GFR calc Af Amer: 57 mL/min — ABNORMAL LOW (ref >90)
GFR calc non Af Amer: 49 mL/min — ABNORMAL LOW (ref >90)
Glucose, Bld: 194 mg/dL — ABNORMAL HIGH (ref 70–99)
Potassium: 4.6 mEq/L (ref 3.5–5.1)
Sodium: 137 mEq/L (ref 135–145)

## 2013-03-13 LAB — CBC
HCT: 30.7 % — ABNORMAL LOW (ref 36.0–46.0)
HCT: 30.3 % — ABNORMAL LOW (ref 36.0–46.0)
Hemoglobin: 10.1 g/dL — ABNORMAL LOW (ref 12.0–15.0)
Hemoglobin: 9.9 g/dL — ABNORMAL LOW (ref 12.0–15.0)
MCH: 29.9 pg (ref 26.0–34.0)
MCHC: 32.9 g/dL (ref 30.0–36.0)
MCHC: 32.7 g/dL (ref 30.0–36.0)
Platelets: 279 10*3/uL (ref 150–400)
RDW: 15.2 % (ref 11.5–15.5)
WBC: 12.8 10*3/uL — ABNORMAL HIGH (ref 4.0–10.5)
WBC: 13.4 10*3/uL — ABNORMAL HIGH (ref 4.0–10.5)

## 2013-03-13 MED ORDER — ALPRAZOLAM 0.25 MG PO TABS
0.2500 mg | ORAL_TABLET | Freq: Four times a day (QID) | ORAL | Status: DC | PRN
Start: 2013-03-13 — End: 2013-03-15
  Administered 2013-03-13 – 2013-03-14 (×4): 0.25 mg via ORAL
  Filled 2013-03-13 (×4): qty 1

## 2013-03-13 MED ORDER — PREDNISONE 20 MG PO TABS
20.0000 mg | ORAL_TABLET | Freq: Two times a day (BID) | ORAL | Status: DC
  Administered 2013-03-13 – 2013-03-15 (×4): 20 mg via ORAL
  Filled 2013-03-13 (×5): qty 1

## 2013-03-13 MED ORDER — ONDANSETRON HCL 4 MG/2ML IJ SOLN
4.0000 mg | Freq: Four times a day (QID) | INTRAMUSCULAR | Status: DC | PRN
  Administered 2013-03-13 – 2013-03-15 (×4): 4 mg via INTRAVENOUS
  Filled 2013-03-13 (×4): qty 2

## 2013-03-13 MED ORDER — TUBERCULIN PPD 5 UNIT/0.1ML ID SOLN
5.0000 [IU] | Freq: Once | INTRADERMAL | Status: AC
  Administered 2013-03-13: 5 [IU] via INTRADERMAL
  Filled 2013-03-13: qty 0.1

## 2013-03-13 NOTE — Progress Notes (Signed)
Inpatient Diabetes Program Recommendations  AACE/ADA: New Consensus Statement on Inpatient Glycemic Control (2013)  Target Ranges:  Prepandial:   less than 140 mg/dL      Peak postprandial:   less than 180 mg/dL (1-2 hours)      Critically ill patients:  140 - 180 mg/dL  Results for [name], [name] (MRN [id number]) as of 03/13/2013 09:05  Ref. Range 05/02/2011 05:00  Hemoglobin A1C Latest Range: <5.7 % 6.0 (H)   Results for [name], [name] (MRN [id number]) as of 03/13/2013 09:05  Ref. Range 03/12/2013 05:33 03/13/2013 04:40  Glucose Latest Range: 70-99 mg/dL 206 (H) 194 (H)    Inpatient Diabetes Program Recommendations Correction (SSI): While inpatient and receiving steroids, please consider ordering CBGs with Novolog correction scale ACHS. HgbA1C: Please consider ordering an A1C to determine glycemic control over the past 2-3 months.  Note: Patient does not have a documented history of diabetes.  However, she takes steroids chronically and has a documented history of drug induced hyperglycemia.  Currently patient is receiving steroids which is likely cause of noted hyperglycemia on labs.  Please order CBGs with Novolog correction ACHS and an A1C.  Will continue to follow.  Thanks, Barnie Alderman, RN, MSN, CCRN Diabetes Coordinator Inpatient Diabetes Program [phone] (Team Pager) [phone] ([facility] office) [phone] [facility] office)

## 2013-03-13 NOTE — Care Management Note (Addendum)
    Page 1 of 1   03/15/2013     10:18:10 AM   CARE MANAGEMENT NOTE 03/15/2013  Patient:  MINAH, AXELROD A   Account Number:  [id number]  Date Initiated:  03/13/2013  Documentation initiated by:  Claretha Cooper  Subjective/Objective Assessment:   Pt admitted from home where she lives alone. Per pt has some family but no immediate family to assist her. Per pt she is "75% homebound". CM told pt she must be homebound to receive [place].     Action/Plan:   Will follow   Anticipated DC Date:  03/15/2013   Anticipated DC Plan:  [place]  CM consult      Choice offered to / List presented to:          [facility] arranged  HH-1 RN      [place].   Status of service:  Completed, signed off Medicare Important Message given?   (If response is "NO", the following Medicare IM given date fields will be blank) Date Medicare IM given:   Date Additional Medicare IM given:    Discharge Disposition:  [place]  Per UR Regulation:    If discussed at Long Length of Stay Meetings, dates discussed:    Comments:  03/15/13 Claretha Cooper RN BSN CM [facility] notified of pending DC.  03/14/13 Claretha Cooper RN BSN CM Pt selected [place] RN from [facility] Shared [place] covered, no copay  03/13/13 Claretha Cooper RN BSN CM

## 2013-03-13 NOTE — Progress Notes (Addendum)
Patient ID: [name], female   DOB: 11/07/1940, 72 y.o.   MRN: [id number] She says she feels somewhat better. She is getting MS IV every 4 hrs for her abdominal pain. She states her stools are more formed, however, BS commode has large amt of stool colored water. No formed stool noted. Ate most of her breakfast. WBC 12.8 this am.  C-diff negative. Filed Vitals:   03/12/13 0541 03/12/13 1616 03/12/13 2106 03/13/13 0517  BP: 165/89 137/65 122/80 151/73  Pulse: 81 64 66 62  Temp: 97.6 F (36.4 C) 98 F (36.7 C) 97.9 F (36.6 C) 97.8 F (36.6 C)  TempSrc: Oral Oral Oral Oral  Resp: 20 20 20 20   Height:      Weight:      SpO2: 98% 98% 97% 97%  UC flare. Has been intolerant of UC medications in the past. Maintained on Prednisone 13m daily at home. Will discuss with Dr. :RLaural Golden   GI attending note; Patient is 72year old Caucasian female with chronic ulcerative colitis and now admitted with worsening symptoms. Her condition was diagnosed 8 or 9 years ago and she has never been in remission. She had a colonoscopy by me in June 2012 revealing disease only in the rectum and sigmoid colon. She also had tubular adenomas removed from her cecum. She was seen in the office 2 weeks ago and prednisone dose was increased without improvement in her abdominal cramps and diarrhea. She developed left leg ulcer with cellulitis 5 months ago and was treated with antibiotics and recently treated for shingles. CT on admission reveals diffuse colitis which means either her condition has progressed or she has superadded or additional process. C. difficile by PCR is negative. Need to rule out other infections as well. Patient is not acutely ill. She is complaining about fluid retention with IV steroids. She would like to be switched to prednisone. Reason patient's disease has never been controlled this because of intolerance to various mesalamine preparations as well as 6 MP. Topical mesalamine preparations in the  past have resulted in intractable. She apparently had problems with Remicade as well. [facility] GCheryll Cockaynebut declined to have surgery. She is agreeable to consider Humira. I believe her anxiety is playing a major role in her illness and inability to tolerate medications. Recommendations; GI pathogen panel. DC IV Solu-Medrol. Prednisone 20 mg by mouth twice a day. Hepatitis B surface antigen. PPD. CRP with a.m. lab

## 2013-03-13 NOTE — Progress Notes (Signed)
[facility] PROGRESS NOTE  [name] UYQ:[id number] DOB: Oct 16, 1940 DOA: 03/10/2013 PCP: Jules Schick, NP  Assessment/Plan: 1. Ulcerative colitis. Change steroids to prednisone. Continue antibiotics.  Appreciate GI assistance. 2. Bloody stools, intermittent, due to #1, not significant, hemoglobin stable 3. Diarrhea due to #1, stool c diff negative, continue supportive treatments. GI pathogen panel sent 4. HTN. Stable 5. Hypokalemia due to GI losses.  Replaced 6. Dehydration. Improved with IV fluids.  Code Status: full code Family Communication: discussed with patient Disposition Plan: discharge home once improved   Consultants:  Gastroenterology  Procedures:  none  Antibiotics:  Cipro,12/20  Flagyl 12/20  HPI/Subjective: Still having loose stools.  Tolerated solid diet. Feels like diarrhea is improving. No blood in stools  Objective: Filed Vitals:   03/13/13 1524  BP: 161/83  Pulse: 68  Temp: 97.9 F (36.6 C)  Resp: 20    Intake/Output Summary (Last 24 hours) at 03/13/13 1809 Last data filed at 03/13/13 1727  Gross per 24 hour  Intake   6716 ml  Output      0 ml  Net   6716 ml   Filed Weights   03/10/13 2102 03/11/13 0320  Weight: 68.493 kg (151 lb) 75 kg (165 lb 5.5 oz)    Exam:   General:  NAD, appears anxious  Cardiovascular: s1, s2, rrr  Respiratory: cta b  Abdomen: soft, diffusely tender, bs+  Musculoskeletal: mild edema noted in LE   Data Reviewed: Basic Metabolic Panel:  Recent Labs Lab 03/10/13 2230 03/11/13 0457 03/11/13 0459 03/11/13 1806 03/12/13 0533 03/13/13 0440  NA 136  --  134*  --  137 137  K 3.6  --  2.7* 5.0 5.6* 4.6  CL 97  --  96  --  107 105  CO2 27  --  26  --  22 21  GLUCOSE 120*  --  121*  --  206* 194*  BUN 20  --  18  --  17 19  CREATININE 1.32*  --  1.40*  --  1.17* 1.11*  CALCIUM 8.8  --  7.7*  --  8.1* 8.0*  MG  --  1.4*  --   --   --   --    Liver Function Tests:  Recent  Labs Lab 03/10/13 2230 03/11/13 0459  AST 18 14  ALT 13 10  ALKPHOS 47 39  BILITOT 0.6 1.4*  PROT 6.5 5.5*  ALBUMIN 3.2* 2.8*   No results found for this basename: LIPASE, AMYLASE,  in the last 168 hours No results found for this basename: AMMONIA,  in the last 168 hours CBC:  Recent Labs Lab 03/10/13 2230 03/11/13 0459 03/12/13 0008 03/12/13 0533 03/13/13 0439 03/13/13 1205  WBC 10.6* 18.1* 15.5* 12.3* 12.8* 13.4*  NEUTROABS 7.4  --   --   --   --   --   HGB 12.3 10.4* 11.0* 10.9* 10.1* 9.9*  HCT 37.8 33.1* 33.4* 33.7* 30.7* 30.3*  MCV 89.6 92.5 90.0 90.8 90.8 91.0  PLT 380 328 353 326 315 279   Cardiac Enzymes:  Recent Labs Lab 03/11/13 0459 03/11/13 0923  TROPONINI <0.30 <0.30   BNP (last 3 results) No results found for this basename: PROBNP,  in the last 8760 hours CBG: No results found for this basename: GLUCAP,  in the last 168 hours  Recent Results (from the past 240 hour(s))  CLOSTRIDIUM DIFFICILE BY PCR     Status: None   Collection Time  03/11/13  4:44 AM      Result Value Range Status   C difficile by pcr NEGATIVE  NEGATIVE Final     Studies: No results found.  Scheduled Meds: . ciprofloxacin  400 mg Intravenous Q12H  . escitalopram  20 mg Oral Daily  . metronidazole  500 mg Intravenous Q8H  . predniSONE  20 mg Oral BID WC  . tuberculin  5 Units Intradermal Once   Continuous Infusions: . sodium chloride 100 mL/hr at 03/13/13 1138    Principal Problem:   Ulcerative colitis Active Problems:   HYPERTENSION   Depression   Hypokalemia   Dehydration    Time spent: 29mns    MEMON,JEHANZEB  [facility] Pager [phone] If 7PM-7AM, please contact night-coverage at [email or web address], password [email or web address]12/22/2014, 6:09 PM  LOS: 3 days

## 2013-03-14 LAB — GI PATHOGEN PANEL BY PCR, STOOL
Campylobacter by PCR: NEGATIVE
Cryptosporidium by PCR: NEGATIVE
E coli (ETEC) LT/ST: NEGATIVE
G lamblia by PCR: NEGATIVE
Norovirus GI/GII: NEGATIVE
Rotavirus A by PCR: NEGATIVE
Shigella by PCR: NEGATIVE

## 2013-03-14 LAB — CBC
MCH: 29.6 pg (ref 26.0–34.0)
MCHC: 32.7 g/dL (ref 30.0–36.0)
MCV: 90.5 fL (ref 78.0–100.0)
Platelets: 285 10*3/uL (ref 150–400)
RDW: 15.2 % (ref 11.5–15.5)

## 2013-03-14 LAB — BASIC METABOLIC PANEL
Creatinine, Ser: 1.13 mg/dL — ABNORMAL HIGH (ref 0.50–1.10)
GFR calc Af Amer: 55 mL/min — ABNORMAL LOW (ref >90)
GFR calc non Af Amer: 48 mL/min — ABNORMAL LOW (ref >90)
Potassium: 3.9 mEq/L (ref 3.5–5.1)
Sodium: 136 mEq/L (ref 135–145)

## 2013-03-14 LAB — C-REACTIVE PROTEIN: CRP: 0.9 mg/dL — ABNORMAL HIGH (ref <0.60)

## 2013-03-14 LAB — HEPATITIS B SURFACE ANTIGEN: Hepatitis B Surface Ag: NEGATIVE

## 2013-03-14 MED ORDER — HYDROCORTISONE ACETATE 25 MG RE SUPP
25.0000 mg | Freq: Two times a day (BID) | RECTAL | Status: DC
  Administered 2013-03-14 – 2013-03-15 (×2): 25 mg via RECTAL
  Filled 2013-03-14 (×6): qty 1

## 2013-03-14 MED ORDER — MORPHINE SULFATE 2 MG/ML IJ SOLN
2.0000 mg | INTRAMUSCULAR | Status: AC
  Administered 2013-03-14: 2 mg via INTRAVENOUS
  Filled 2013-03-14: qty 1

## 2013-03-14 NOTE — Progress Notes (Signed)
Patient complains of rectum pain when having bm, and stated she saw some bright red blood when she wiped. Patient has history of hemhroids. Dr. Roderic Palau notified. New orders for Anusol suppository.

## 2013-03-14 NOTE — Progress Notes (Signed)
[facility] PROGRESS NOTE  [name] QPY:[id number] DOB: 09/09/1940 DOA: 03/10/2013 PCP: Jules Schick, NP  Assessment/Plan: 1. Ulcerative colitis. Currently on prednisone. Continue antibiotics.  Appreciate GI assistance. Receiving narcotics for abdominal pain. Plans are to possibly initiate humira as an outpatient 2. Bloody stools, intermittent, due to #1, not significant, hemoglobin stable 3. Diarrhea due to #1, stool c diff negative, continue supportive treatments. GI pathogen panel sent 4. HTN. Stable 5. Hypokalemia due to GI losses.  Replaced 6. Dehydration. Improved with IV fluids.  Code Status: full code Family Communication: discussed with patient Disposition Plan: discharge home once improved   Consultants:  Gastroenterology  Procedures:  none  Antibiotics:  Cipro,12/20  Flagyl 12/20  HPI/Subjective: Still having loose stools.  Felt better this morning, but at this time is complaining of worsening abd pain.  Tolerating diet  Objective: Filed Vitals:   03/14/13 1551  BP: 174/86  Pulse: 66  Temp: 97.8 F (36.6 C)  Resp: 20    Intake/Output Summary (Last 24 hours) at 03/14/13 1725 Last data filed at 03/14/13 0122  Gross per 24 hour  Intake   7016 ml  Output      0 ml  Net   7016 ml   Filed Weights   03/10/13 2102 03/11/13 0320  Weight: 68.493 kg (151 lb) 75 kg (165 lb 5.5 oz)    Exam:   General:  NAD, appears uncomfortable  Cardiovascular: s1, s2, rrr  Respiratory: cta b  Abdomen: soft, diffusely tender, bs+  Musculoskeletal: no edema noted in LE   Data Reviewed: Basic Metabolic Panel:  Recent Labs Lab 03/10/13 2230 03/11/13 0457 03/11/13 0459 03/11/13 1806 03/12/13 0533 03/13/13 0440 03/14/13 0510  NA 136  --  134*  --  137 137 136  K 3.6  --  2.7* 5.0 5.6* 4.6 3.9  CL 97  --  96  --  107 105 104  CO2 27  --  26  --  22 21 23   GLUCOSE 120*  --  121*  --  206* 194* 152*  BUN 20  --  18  --  17 19 19    CREATININE 1.32*  --  1.40*  --  1.17* 1.11* 1.13*  CALCIUM 8.8  --  7.7*  --  8.1* 8.0* 8.1*  MG  --  1.4*  --   --   --   --   --    Liver Function Tests:  Recent Labs Lab 03/10/13 2230 03/11/13 0459  AST 18 14  ALT 13 10  ALKPHOS 47 39  BILITOT 0.6 1.4*  PROT 6.5 5.5*  ALBUMIN 3.2* 2.8*   No results found for this basename: LIPASE, AMYLASE,  in the last 168 hours No results found for this basename: AMMONIA,  in the last 168 hours CBC:  Recent Labs Lab 03/10/13 2230  03/12/13 0008 03/12/13 0533 03/13/13 0439 03/13/13 1205 03/14/13 0510  WBC 10.6*  < > 15.5* 12.3* 12.8* 13.4* 10.8*  NEUTROABS 7.4  --   --   --   --   --   --   HGB 12.3  < > 11.0* 10.9* 10.1* 9.9* 9.7*  HCT 37.8  < > 33.4* 33.7* 30.7* 30.3* 29.7*  MCV 89.6  < > 90.0 90.8 90.8 91.0 90.5  PLT 380  < > 353 326 315 279 285  < > = values in this interval not displayed. Cardiac Enzymes:  Recent Labs Lab 03/11/13 0459 03/11/13 0923  TROPONINI <0.30 <0.30  BNP (last 3 results) No results found for this basename: PROBNP,  in the last 8760 hours CBG: No results found for this basename: GLUCAP,  in the last 168 hours  Recent Results (from the past 240 hour(s))  CLOSTRIDIUM DIFFICILE BY PCR     Status: None   Collection Time    03/11/13  4:44 AM      Result Value Range Status   C difficile by pcr NEGATIVE  NEGATIVE Final     Studies: No results found.  Scheduled Meds: . ciprofloxacin  400 mg Intravenous Q12H  . escitalopram  20 mg Oral Daily  . hydrocortisone  25 mg Rectal BID  . metronidazole  500 mg Intravenous Q8H  . predniSONE  20 mg Oral BID WC  . tuberculin  5 Units Intradermal Once   Continuous Infusions:    Principal Problem:   Ulcerative colitis Active Problems:   HYPERTENSION   Depression   Hypokalemia   Dehydration    Time spent: 33mns    MEMON,JEHANZEB  [facility] Pager [phone] If 7PM-7AM, please contact night-coverage at [email or web address], password  [facility]12/23/2014, 5:25 PM  LOS: 4 days

## 2013-03-14 NOTE — Progress Notes (Addendum)
Patient ID: [name], female   DOB: 10/28/1940, 72 y.o.   MRN: [id number] States she feels much better today. Her appetite is good. She says she is 50% better. She passed small ball like stools.  CRP is coming down.  So far her TB skin test is negative. Hep B negative. WBC ct is coming down,. Stool pathogen is negative. Filed Vitals:   03/13/13 1524 03/13/13 2030 03/14/13 0638 03/14/13 1551  BP: 161/83 176/84 153/69 174/86  Pulse: 68 69 86 66  Temp: 97.9 F (36.6 C) 98.3 F (36.8 C) 98.5 F (36.9 C) 97.8 F (36.6 C)  TempSrc: Oral Oral Oral Oral  Resp: 20 20 20 20   Height:      Weight:      SpO2: 98% 98% 96% 95%  Will continue to follow.    GI attending note; Patient is feeling better. She states she had 12 stools in the last 24 hours but all she passed was bits and pieces are small balls. Abdominal exam reveals soft abdomen with mild tenderness hypogastrium. Patient is agreeable to be treated with Humira. We will send a request to have insurance as soon as PPD is confirmed to be negative.

## 2013-03-15 LAB — BASIC METABOLIC PANEL
Chloride: 102 mEq/L (ref 96–112)
GFR calc Af Amer: 54 mL/min — ABNORMAL LOW (ref >90)
GFR calc non Af Amer: 47 mL/min — ABNORMAL LOW (ref >90)
Potassium: 3.6 mEq/L (ref 3.5–5.1)

## 2013-03-15 MED ORDER — HYDROCORTISONE ACETATE 25 MG RE SUPP: 25.0000 mg | Freq: Two times a day (BID) | RECTAL | Status: DC

## 2013-03-15 MED ORDER — PREDNISONE 20 MG PO TABS: 20.0000 mg | ORAL_TABLET | Freq: Two times a day (BID) | ORAL | Status: DC

## 2013-03-15 MED ORDER — LOPERAMIDE HCL 2 MG PO CAPS: 2.0000 mg | ORAL_CAPSULE | Freq: Three times a day (TID) | ORAL | Status: DC | PRN

## 2013-03-15 NOTE — Progress Notes (Signed)
Patient with orders to be discharge home. Discharge instructions given, patient verbalized understanding. Prescriptions given. Patient left with family in private vehicle.

## 2013-03-15 NOTE — Discharge Summary (Signed)
Physician Discharge Summary  [name]:[id number] DOB: 04/28/1940 DOA: 03/10/2013  PCP: [name] Schick, NP  Admit date: 03/10/2013 Discharge date: 03/15/2013  Time spent: 40 minutes  Recommendations for Outpatient Follow-up:  1. Follow up with Dr. Laural [name] as previously scheduled to discuss initiation of Humira  Discharge Diagnoses:  Principal Problem:   Ulcerative colitis Active Problems:   HYPERTENSION   Depression   Hypokalemia   Dehydration   Discharge Condition: improved  Diet recommendation: low residue  [facility] Weights   12/19/[name] 2102 12/20/[name] 0320  Weight: 68.493 kg (151 lb) 75 kg (165 lb 5.5 oz)    History of present illness:  72 year old [name] who has a past medical history of Hypertension; Ulcerative colitis; Fibromyalgia; Ulcerative colitis; Rectal bleeding; Diarrhea; Hyperglycemia, drug-induced (05/02/2011); Anemia due to blood loss (05/02/2011); and Hypokalemia.  He presented to the ED with ongoing diarrhea which got worse tonight. Patient says that she has seen Dr. Corbin [name] 2 days ago at that time she had bloody diarrhea. Her dose of prednisone was changed from 10 mg daily to 25 mg daily. She says that bleeding has stopped since then but she continued to have diarrhea. Usually she was having 8-10 loose bowel movements everyday for past 2 weeks. Since she came to ED patient had almost 15 loose bowel movements. She also complains of abdominal pain, associated left lower quadrant. Patient also had brief episode of chest pain at home which lasted about 15 minutes and she took 2 tablets of aspirin for that. She also admits to having mild shortness of breath she denies any radiation of of the pain no sweating.  She denies nausea vomiting.  In the ED CT abdomen pelvis done, Which showed severe ulcerative colitis.   Hospital Course:  This patient was admitted to the hospital with persistent diarrhea, bloody stool, and dehydration. She was found to have a acute flare of  her ulcerative colitis. The patient was started on antibiotics as well as steroids. She was followed by gastroenterology while in the hospital. Infectious workup was found to be unremarkable. She steadily improved her hospital course, steroids have been changed to prednisone and antibiotics have been discontinued. The patient is tolerating a solid diet at this time. Her diarrhea appears to be improving. She'll followup with gastroenterology to initiate therapy with Humira. She has been cleared for discharge by gastroenterology.  Procedures:  none  Consultations:  Gastroenterology  Discharge Exam: Filed Vitals:   12/24/[name] 0607  BP: 150/90  Pulse: 64  Temp: 98 F (36.7 C)  Resp: 15    General: NAD Cardiovascular: S1, S2 RRR Respiratory: CTA B  Discharge Instructions  Discharge Orders   Future Appointments Provider Department Dept Phone   03/27/2013 2:00 PM Rogene Houston, MD [facility] [phone]   Future Orders Complete By Expires   Call MD for:  persistant nausea and vomiting  As directed    Call MD for:  temperature >100.4  As directed    Diet - low sodium heart healthy  As directed    Increase activity slowly  As directed        Medication List         ALPRAZolam 0.5 MG tablet  Commonly known as:  XANAX  Take 1 tablet (0.5 mg total) by mouth 2 (two) times daily as needed. FOR ANXIETY     antiseptic oral rinse Liqd  1 application by Mouth Rinse route as needed. FOR DRY MOUTH     B  Complex Caps  Take 1 capsule by mouth every morning.     benazepril-hydrochlorthiazide 20-12.5 MG per tablet  Commonly known as:  LOTENSIN HCT  Take 1 tablet by mouth daily.     co-enzyme Q-10 50 MG capsule  Take 50 mg by mouth once a week.     escitalopram 20 MG tablet  Commonly known as:  LEXAPRO  Take 20 mg by mouth daily.     Fish Oil 1000 MG Cpdr  Take 1 capsule by mouth every evening.     Hydrocodone-Acetaminophen 5-300 MG Tabs  Take 1 tablet by  mouth 2 (two) times daily as needed.     hydrocortisone 25 MG suppository  Commonly known as:  ANUSOL-HC  Place 1 suppository (25 mg total) rectally 2 (two) times daily.     loperamide 2 MG capsule  Commonly known as:  IMODIUM  Take 1 capsule (2 mg total) by mouth every 8 (eight) hours as needed for diarrhea or loose stools.     ondansetron 4 MG tablet  Commonly known as:  ZOFRAN  Take 4 mg by mouth every 8 (eight) hours as needed for nausea or vomiting.     predniSONE 20 MG tablet  Commonly known as:  DELTASONE  Take 1 tablet (20 mg total) by mouth 2 (two) times daily with a meal.     PROBIOTIC FORMULA PO  Take 1-2 capsules by mouth every morning.     SENSI-CARE PROTECTIVE BARRIER EX  Apply 1 application topically daily.     traMADol 50 MG tablet  Commonly known as:  ULTRAM  Take 1 tablet (50 mg total) by mouth daily.     TYLENOL 500 MG tablet  Generic drug:  acetaminophen  Take 500-1,000 mg by mouth every 6 (six) hours as needed. FOR PAIN     vitamin A 10000 UNIT capsule  Take 10,000 Units by mouth once a week.     vitamin C 500 MG tablet  Commonly known as:  ASCORBIC ACID  Take 500 mg by mouth once a week.     Vitamin D3 1000 UNITS Caps  Take 1 capsule by mouth every morning.     vitamin E 400 UNIT capsule  Take 400 Units by mouth every morning.     WOMENS MULTI VITAMIN & MINERAL PO  Take 1 tablet by mouth every other day.     zolpidem 10 MG tablet  Commonly known as:  AMBIEN  Take 1 tablet (10 mg total) by mouth at bedtime as needed. FOR SLEEP       Allergies  Allergen Reactions  . Azathioprine Nausea And Vomiting and Other (See Comments)    SEVERE NAUSEA AND VOMITING WITH CHEST PAIN-  PATIENT INSISTS NEVER TO BE GIVEN ANYTHING SIMILAR TO THIS  . Tape Other (See Comments)    BLISTERS AND SKIN TEARING  . Codeine Nausea And Vomiting  . Penicillins Hives  . Povidone Hives    BETADINE  . Procaine Hcl Other (See Comments)    SEVERE GI UPSET  (NAUSEA,VOMITING AND DIARRHEA)  . Sulfonamide Derivatives Hives  . Azithromycin     Mycins   . Dairy Aid [Lactase]   . Latex Rash  . Niacin And Related Rash       Follow-up Information   Follow up with [place] RN, They will call before visit)    Contact information:   [address] [place] [address] [phone]      Follow up with  Rogene Houston, MD. (as scheduled)    Specialty:  Gastroenterology   Contact information:   [place], [place] [address] [phone]        The results of significant diagnostics from this hospitalization (including imaging, microbiology, ancillary and laboratory) are listed below for reference.    Significant Diagnostic Studies: Ct Abdomen Pelvis W Contrast  03/11/2013   CLINICAL DATA:  72 year old [name] with abdominal and pelvic pain. History of ulcerative colitis.  EXAM: CT ABDOMEN AND PELVIS WITH CONTRAST  TECHNIQUE: Multidetector CT imaging of the abdomen and pelvis was performed using the standard protocol following bolus administration of intravenous contrast.  CONTRAST:  100 cc intravenous Omnipaque 300  COMPARISON:  04/30/2011 and prior CTs dating back to 12/01/2006  FINDINGS: There is mild circumferential wall thickening of the majority of the colon. Wall thickening is increased involving the sigmoid colon with mild adjacent inflammation.  There is no evidence of bowel obstruction, pneumoperitoneum or abscess.  The liver, spleen, adrenal glands, pancreas and kidneys are unremarkable except for bilateral renal cortical atrophy.  Patient is status post cholecystectomy and hysterectomy.  There is no evidence of free fluid, enlarged lymph nodes, biliary dilation or abdominal aortic aneurysm.  The bladder and adnexal regions are within normal limits.  No acute or suspicious bony abnormalities are identified.  IMPRESSION: Circumferential wall thickening of the majority of the colon, greatest in the sigmoid  colon with adjacent inflammation. This is compatible with is patient's known ulcerative colitis. No evidence of pneumoperitoneum, abscess or bowel obstruction.   Electronically Signed   By: Hassan Rowan M.D.   On: 03/11/2013 01:29    Microbiology: Recent Results (from the past 240 hour(s))  CLOSTRIDIUM DIFFICILE BY PCR     Status: None   Collection Time    12/20/[name]  4:44 AM      Result Value Range Status   C difficile by pcr NEGATIVE  NEGATIVE Final     Labs: Basic Metabolic Panel:  Recent Labs Lab 12/20/[name] 0457 12/20/[name] 0459 12/20/[name] 1806 12/21/[name] 0533 12/22/[name] 0440 12/23/[name] 0510 12/24/[name] 0523  NA  --  134*  --  137 137 136 137  K  --  2.7* 5.0 5.6* 4.6 3.9 3.6  CL  --  96  --  107 105 104 102  CO2  --  26  --  22 21 23 26   GLUCOSE  --  121*  --  206* 194* 152* 153*  BUN  --  18  --  17 19 19 15   CREATININE  --  1.40*  --  1.17* 1.11* 1.13* 1.15*  CALCIUM  --  7.7*  --  8.1* 8.0* 8.1* 8.3*  MG 1.4*  --   --   --   --   --   --    Liver Function Tests:  Recent Labs Lab 12/19/[name] 2230 12/20/[name] 0459  AST 18 [name]  ALT 13 10  ALKPHOS 47 39  BILITOT 0.6 1.4*  PROT 6.5 5.5*  ALBUMIN 3.2* 2.8*   No results found for this basename: LIPASE, AMYLASE,  in the last 168 hours No results found for this basename: AMMONIA,  in the last 168 hours CBC:  Recent Labs Lab 12/19/[name] 2230  12/21/[name] 0008 12/21/[name] 0533 12/22/[name] 0439 12/22/[name] 1205 12/23/[name] 0510  WBC 10.6*  < > 15.5* 12.3* 12.8* 13.4* 10.8*  NEUTROABS 7.4  --   --   --   --   --   --  HGB 12.3  < > 11.0* 10.9* 10.1* 9.9* 9.7*  HCT 37.8  < > 33.4* 33.7* 30.7* 30.3* 29.7*  MCV 89.6  < > 90.0 90.8 90.8 91.0 90.5  PLT 380  < > 353 326 315 279 285  < > = values in this interval not displayed. Cardiac Enzymes:  Recent Labs Lab 12/20/[name] 0459 12/20/[name] 0923  TROPONINI <0.30 <0.30   BNP: BNP (last 3 results) No results found for this basename: PROBNP,  in the last 8760 hours CBG: No results found for this basename:  GLUCAP,  in the last 168 hours     Signed:  Grey Schlauch  [facility] 03/15/2013, 1:51 PM

## 2013-03-15 NOTE — Progress Notes (Signed)
Subjective; Patient continues to complain of LLQ abdominal pain and rectal pain on defecation. She made 15 trips to the bathroom in the last 24 hours and each time she passed bits and pieces of formed stool. She denies nausea vomiting fever or chills. She is having less abdominal and rectal pain compared to when she came in. She has very good appetite.  Objective; BP 150/90  Pulse 64  Temp(Src) 98 F (36.7 C) (Oral)  Resp 15  Ht 5' 8.5" (1.74 m)  Wt 165 lb 5.5 oz (75 kg)  BMI 24.77 kg/m2  SpO2 98% Patient is alert and in no acute distress. Abdomen is symmetrical soft with mild tenderness at LLQ and hypogastric area. No organomegaly or masses. No LE edema noted.   Lab data; Serum sodium 137, potassium 3.6, chloride 102, CO2 26, BUN 15 and creatinine 1.15 Glucose 153. GI pathogen panel is negative. PPD at 48 hours is negative.  Assessment; Chronic ulcerative colitis with recent flare up of symptoms. She has never been in remission for at least 5 years that I've known her. She has been intolerant of multiple medications. She is gradually improving with 40 mg of prednisone per day. She is agreeable to be treated with Humira. GI pathogen panel is negative and she therefore will not need antibiotics. Anemia secondary to GI blood loss although she is not having overt bleeding while in the hospital. Chronic kidney disease. She has mildly elevated serum creatinine for over a year. Serum creatinine has improved with hydration.  Recommendations; Discontinue IV antibiotics. Continue prednisone at 20 mg by mouth twice a day. Initiate treatment with Humira when approved. My office sending request to her insurance for approval. Can try Anusol-HC suppositories or ProctoFoam HC for her rectal pain and tenesmus. Office visit as planned on 03/27/2013.

## 2013-03-21 NOTE — Progress Notes (Signed)
Asked by [facility] to call pt regarding [employer] aide and light housekeeping. Per pt she has [facility] and PT. Needs someone to assist with bath and light housekeeping. CM advised pt to alert RN of need for aide for ADLs. Advised to call [facility] to inquire about housekeeping.

## 2013-03-27 ENCOUNTER — Ambulatory Visit (INDEPENDENT_AMBULATORY_CARE_PROVIDER_SITE_OTHER): Payer: Medicare Other | Admitting: Internal Medicine

## 2013-03-27 ENCOUNTER — Encounter (INDEPENDENT_AMBULATORY_CARE_PROVIDER_SITE_OTHER): Payer: Self-pay | Admitting: Internal Medicine

## 2013-03-27 VITALS — BP 110/70 | HR 84 | Temp 98.1°F | Resp 18 | Ht 68.5 in | Wt 156.1 lb

## 2013-03-27 DIAGNOSIS — K5190 Ulcerative colitis, unspecified, without complications: Secondary | ICD-10-CM

## 2013-03-27 DIAGNOSIS — B370 Candidal stomatitis: Secondary | ICD-10-CM

## 2013-03-27 MED ORDER — FLUCONAZOLE 100 MG PO TABS: 100.0000 mg | ORAL_TABLET | Freq: Every day | ORAL | Status: DC

## 2013-03-27 NOTE — Patient Instructions (Addendum)
Prednisone schedule as follows; 20 mg in a.m. and 15 mg in p.m. for one week(35 mg daily). 20 mg in a.m. and 10 mg in p.m. for one week(30 mg day) 20 mg in a.m. and  5 mg in p.m. for one week(25 mg daily) 20 mg daily for one week. 15 mg daily for one week. 10 mg daily for one week. 5 mg daily for one week and stop.   Diflucan or fluconazole 100 mg by mouth daily for 3 days. Can repeat if necessary

## 2013-03-27 NOTE — Progress Notes (Signed)
Presenting complaint;  Followup for ulcerative colitis.  Database;  Patient is 73 year old Caucasian female who has history of ulcerative colitis was hospitalized at [facility] between December 19 and 03/15/2013 for flareup. She was treated with IV steroids and then transitioned to by mouth steroids. Stool studies were negative as reviewed her lab data. Hepatitis B surface antigen was negative and so was PPD. Request for Humira has been sent to her insurance. She was diagnosed with ulcerative colitis back in March 2008. She had distal disease. Last colonoscopy was in June 2012 revealing cecal tubular adenoma and distal disease involving the rectum and half of sigmoid colon. She took Remicade when she was seeing Dr. Ardis Hughs and states it did not work. She has not responded to oral mesalamine and developed nausea and vomiting with azathioprine. She has been treated repeatedly with prednisone she has never had prolonged sustained remission.  Subjective; [name] is is feeling some better. He continues to have 8-11 stools per day. Most of her stools are formed and in bits and pieces. Today she's only had one stool. She continues to have intermittent hematochezia. She continues to experience abdominal pain and hypogastric region and LLQ. She denies nausea vomiting or chills. Over the weekend she developed a wide whitish exudate over her tongue and buccal mucosa and use hydrogen peroxide and brush to get rid of it. She still has some soreness. She reports discomfort on urinating and is concerned she may have Candida vaginitis.    Current Medications: Current Outpatient Prescriptions  Medication Sig Dispense Refill  . acetaminophen (TYLENOL) 500 MG tablet Take 500-1,000 mg by mouth every 6 (six) hours as needed. FOR PAIN      . ALPRAZolam (XANAX) 0.5 MG tablet Take 1 tablet (0.5 mg total) by mouth 2 (two) times daily as needed. FOR ANXIETY  60 tablet  0  . antiseptic oral rinse (BIOTENE) LIQD 1 application by  Mouth Rinse route as needed. FOR DRY MOUTH       . B Complex CAPS Take 1 capsule by mouth every morning.       . benazepril-hydrochlorthiazide (LOTENSIN HCT) 20-12.5 MG per tablet Take 1 tablet by mouth daily.      . Cholecalciferol (VITAMIN D3) 1000 UNITS CAPS Take 1 capsule by mouth every morning.       Marland Kitchen co-enzyme Q-10 50 MG capsule Take 50 mg by mouth once a week.       . escitalopram (LEXAPRO) 20 MG tablet Take 20 mg by mouth daily.      . hydrocodone-acetaminophen (LORCET-HD) 5-500 MG per capsule Take 1 capsule by mouth every 6 (six) hours as needed for pain (Patient states that she only takes if absolutely needed).      Marland Kitchen loperamide (IMODIUM) 2 MG capsule Take 1 capsule (2 mg total) by mouth every 8 (eight) hours as needed for diarrhea or loose stools.  30 capsule  0  . Multiple Vitamins-Minerals (WOMENS MULTI VITAMIN & MINERAL PO) Take 1 tablet by mouth every other day.       . Omega-3 Fatty Acids (FISH OIL) 1000 MG CPDR Take 1 capsule by mouth every evening.       . ondansetron (ZOFRAN) 4 MG tablet Take 4 mg by mouth every 8 (eight) hours as needed for nausea or vomiting.      Marland Kitchen Petrolatum-Zinc Oxide (SENSI-CARE PROTECTIVE BARRIER EX) Apply 1 application topically daily.        . predniSONE (DELTASONE) 20 MG tablet Take 1 tablet (20 mg  total) by mouth 2 (two) times daily with a meal.  30 tablet  0  . Probiotic Product (PROBIOTIC FORMULA PO) Take 1-2 capsules by mouth every morning.       . traMADol (ULTRAM) 50 MG tablet Take 1 tablet (50 mg total) by mouth daily.  30 tablet  0  . vitamin A 10000 UNIT capsule Take 10,000 Units by mouth once a week.       . vitamin C (ASCORBIC ACID) 500 MG tablet Take 500 mg by mouth once a week.       . vitamin E 400 UNIT capsule Take 400 Units by mouth every morning.       . zolpidem (AMBIEN) 10 MG tablet Take 1 tablet (10 mg total) by mouth at bedtime as needed. FOR SLEEP  30 tablet  0  . [DISCONTINUED] sodium chloride (OCEAN) 0.65 % SOLN nasal spray  Place 2 sprays into the nose as needed for congestion.       No current facility-administered medications for this visit.     Objective: Blood pressure 110/70, pulse 84, temperature 98.1 F (36.7 C), temperature source Oral, resp. rate 18, height 5' 8.5" (1.74 m), weight 156 lb 1.6 oz (70.806 kg). Patient is alert and in no acute distress. Face is somewhat puffy. Conjunctiva is pink. Sclera is nonicteric Oropharyngeal mucosa is normal. No neck masses or thyromegaly noted. Cardiac exam with regular rhythm normal S1 and S2. No murmur or gallop noted. Lungs are clear to auscultation. Abdomen symmetrical. Bowel sounds are normal. On palpation abdomen is soft mild generalized tenderness but more so at LLQ and hypogastric area. No organomegaly or masses.  No LE edema or clubbing noted.  Labs/studies Results: PPD placed 03/13/2013; read at 48 hours negative. Hepatitis B surface antigen negative on 03/13/2013. GI pathogen panel negative 03/13/2013. C. difficile by PCR negative on 03/11/2013. CRP was 1.8 on 03/02/2013 and 0.9 on 03/14/2013.    Assessment:  #1. Ulcerative colitis. She has history of distal UC. Last colonoscopy was in June 2012 with removal of cecal tubular adenoma. Ionic biopsies revealed Active colitis with glandular atypia felt to be secondary to inflammation felt to be reactive rather than true glandular dysplasia. Abdominopelvic CT during recent hospitalization showed diffuse changes of colitis but no evidence of infection. Modest improvement noted with prednisone she cannot stay on high-dose for long period. Request for Humira as been submitted. #2. Oropharyngeal candidiasis.    Plan:  Begin prednisone taper. She will drop the dose by 5 mg every week. Written instructions provided. Fluconazole 100 mg by mouth daily for 3 days and can repeat if necessary. Prescription given for 7 doses. Will begin Humira as soon as it is approved by her insurance. Dose would be 80  mg on days 1 and 2; 80 mg on day 15 and then every 40 mg every 2 weeks subcutaneously.

## 2013-03-31 ENCOUNTER — Telehealth (INDEPENDENT_AMBULATORY_CARE_PROVIDER_SITE_OTHER): Payer: Self-pay | Admitting: *Deleted

## 2013-03-31 NOTE — Telephone Encounter (Signed)
I rec'd a call from Juliaetta @ [employer]. She was requiring more information for the medical director for the approval of Humira. The information was given and she states that it will be 24 hours before a decision is made, the patient as well as the office will be made aware.

## 2013-04-10 ENCOUNTER — Telehealth: Payer: Self-pay | Admitting: Nurse Practitioner

## 2013-04-10 NOTE — Telephone Encounter (Signed)
Pt needs appt with MMM for med refill appt scheduled

## 2013-04-12 ENCOUNTER — Telehealth (INDEPENDENT_AMBULATORY_CARE_PROVIDER_SITE_OTHER): Payer: Self-pay | Admitting: *Deleted

## 2013-04-12 NOTE — Telephone Encounter (Signed)
[name] would like to speak with [name] about her [facility] medicine. She has received a letter stating the medicine has been approved. Some of the questions are: how much will she inject at a time, how long will one bottle last, where should she give the injection and could her [name] show her how. The return phone number is [phone].

## 2013-04-13 ENCOUNTER — Encounter: Payer: Self-pay | Admitting: Nurse Practitioner

## 2013-04-13 ENCOUNTER — Ambulatory Visit (INDEPENDENT_AMBULATORY_CARE_PROVIDER_SITE_OTHER): Payer: Medicare Other | Admitting: Nurse Practitioner

## 2013-04-13 VITALS — BP 139/84 | HR 83 | Temp 97.0°F | Ht 68.5 in | Wt 158.0 lb

## 2013-04-13 DIAGNOSIS — M797 Fibromyalgia: Secondary | ICD-10-CM

## 2013-04-13 DIAGNOSIS — D500 Iron deficiency anemia secondary to blood loss (chronic): Secondary | ICD-10-CM

## 2013-04-13 DIAGNOSIS — F329 Major depressive disorder, single episode, unspecified: Secondary | ICD-10-CM

## 2013-04-13 DIAGNOSIS — IMO0001 Reserved for inherently not codable concepts without codable children: Secondary | ICD-10-CM

## 2013-04-13 DIAGNOSIS — F3289 Other specified depressive episodes: Secondary | ICD-10-CM

## 2013-04-13 DIAGNOSIS — T50905A Adverse effect of unspecified drugs, medicaments and biological substances, initial encounter: Secondary | ICD-10-CM

## 2013-04-13 DIAGNOSIS — R739 Hyperglycemia, unspecified: Secondary | ICD-10-CM

## 2013-04-13 DIAGNOSIS — R7309 Other abnormal glucose: Secondary | ICD-10-CM

## 2013-04-13 DIAGNOSIS — F32A Depression, unspecified: Secondary | ICD-10-CM

## 2013-04-13 DIAGNOSIS — I10 Essential (primary) hypertension: Secondary | ICD-10-CM

## 2013-04-13 MED ORDER — CLORAZEPATE DIPOTASSIUM 3.75 MG PO TABS: 3.7500 mg | ORAL_TABLET | Freq: Two times a day (BID) | ORAL | Status: DC | PRN

## 2013-04-13 MED ORDER — BENAZEPRIL-HYDROCHLOROTHIAZIDE 20-12.5 MG PO TABS: 1.0000 | ORAL_TABLET | Freq: Every day | ORAL | Status: DC

## 2013-04-13 MED ORDER — ZOLPIDEM TARTRATE 10 MG PO TABS: 10.0000 mg | ORAL_TABLET | Freq: Every evening | ORAL | Status: DC | PRN

## 2013-04-13 MED ORDER — TRAMADOL HCL 50 MG PO TABS: 50.0000 mg | ORAL_TABLET | Freq: Every day | ORAL | Status: DC

## 2013-04-13 NOTE — Progress Notes (Signed)
Subjective:    Patient ID: [name], female    DOB: 10/06/1940, 73 y.o.   MRN: [id number]  Hypertension This is a chronic problem. The current episode started more than 1 year ago. The problem is unchanged. The problem is controlled. Pertinent negatives include no blurred vision, chest pain, headaches, neck pain, orthopnea, palpitations, peripheral edema or shortness of breath. There are no associated agents to hypertension. Past treatments include ACE inhibitors and diuretics. The current treatment provides moderate improvement.  ulcerative colitis Dr. [name]- curremtly on prednisone- they want to start her on humiria- should be starting n the next couple of weeks. Drepression/GAD lexapor- working well to keep her from worrying so much. Takes xanax as needed at least BID- insurance no longer wants to pay for xanax so need to change. insomnia ambien but only takes occasionally- uses benadryl most of the time.   Review of Systems  Eyes: Negative for blurred vision.  Respiratory: Negative for shortness of breath.   Cardiovascular: Negative for chest pain, palpitations and orthopnea.  Gastrointestinal: Positive for nausea, diarrhea and constipation.  Musculoskeletal: Negative for neck pain.  Neurological: Negative for headaches.  All other systems reviewed and are negative.       Objective:   Physical Exam  Constitutional: She is oriented to person, place, and time. She appears well-developed and well-nourished.  HENT:  Nose: Nose normal.  Mouth/Throat: Oropharynx is clear and moist.  Eyes: EOM are normal.  Neck: Trachea normal, normal range of motion and full passive range of motion without pain. Neck supple. No JVD present. Carotid bruit is not present. No thyromegaly present.  Cardiovascular: Normal rate, regular rhythm, normal heart sounds and intact distal pulses.  Exam reveals no gallop and no friction rub.   No murmur heard. Pulmonary/Chest: Effort normal and breath  sounds normal.  Abdominal: Soft. Bowel sounds are normal. She exhibits no distension and no mass. There is tenderness (diffuse).  Musculoskeletal: Normal range of motion.  Lymphadenopathy:    She has no cervical adenopathy.  Neurological: She is alert and oriented to person, place, and time. She has normal reflexes.  Skin: Skin is warm and dry.  Psychiatric: She has a normal mood and affect. Her behavior is normal. Judgment and thought content normal.   BP 139/84  Pulse 83  Temp(Src) 97 F (36.1 C) (Oral)  Ht 5' 8.5" (1.74 m)  Wt 158 lb (71.668 kg)  BMI 23.67 kg/m2        Assessment & Plan:   1. Hyperglycemia, drug-induced   2. HYPERTENSION   3. Depression   4. Fibromyalgia   5. Anemia due to blood loss    Orders Placed This Encounter  Procedures  . CMP14+EGFR  . NMR, lipoprofile   Meds ordered this encounter  Medications  . benazepril-hydrochlorthiazide (LOTENSIN HCT) 20-12.5 MG per tablet    Sig: Take 1 tablet by mouth daily.    Dispense:  30 tablet    Refill:  5  . traMADol (ULTRAM) 50 MG tablet    Sig: Take 1 tablet (50 mg total) by mouth daily.    Dispense:  30 tablet    Refill:  1  . clorazepate (TRANXENE) 3.75 MG tablet    Sig: Take 1 tablet (3.75 mg total) by mouth 2 (two) times daily as needed for anxiety.    Dispense:  30 tablet    Refill:  3    Order Specific Question:  Supervising Provider    Answer:  Chipper Herb [  [id number]]   Changed xanax to clorazepate due to insurance Labs pending Health maintenance reviewed Diet and exercise encouraged Continue all meds Follow up  In 3 months   [place], FNP

## 2013-04-13 NOTE — Telephone Encounter (Signed)
I have called [name], leaving a message . I told her that her Insurance stated that she could use [facility] to get the Humira. Or she could use the mail order: [facility] [phone] , [place] in [place],  [place] [phone]. I have called this prescription into [facility]. As follows: Inject 80 mg SQ on day 1, Inject 80 mg on day 2, Inject 40 mg SQ on day 14, Inject 40 mg SQ on day 15, then inject 40 mg SQ every 14 days. A written prescription will be faxed this afternoon.  I told the patient that her Home Health Nurse could call me about the Humira or when she rec'd the Humira to call and make an appointment for me to teach her. Also ask that she call me back.

## 2013-04-13 NOTE — Patient Instructions (Signed)
Ulcerative Colitis Ulcerative colitis is a long lasting swelling and soreness (inflammation) of the colon (large intestine). In patients with ulcerative colitis, sores (ulcers) and inflammation of the inner lining of the colon lead to illness. Ulcerative colitis can also cause problems outside the digestive tract.  Ulcerative colitis is closely related to another condition of inflammation of the intestines called Crohn's disease. Together, they are frequently referred to as inflammatory bowel disease (IBD). Ulcerative colitis and Crohn's diseases are conditions that can last years to decades. Men and women are affected equally. They most commonly begin during adolescence and early adulthood. SYMPTOMS  Common symptoms of ulcerative colitis include rectal bleeding and diarrhea. There is a wide range of symptoms among patients with this disease depending on how severe the disease is. Some of these symptoms are:  Abdominal pain or cramping.  Diarrhea.  Fever.  Tiredness (fatigue).  Weight loss.  Night sweats.  Rectal pain.  Feeling the immediate need to have a bowel movement (rectal urgency). CAUSES  Ulcerative colitis is caused by increased activity of the immune system in the intestines. The immune system is the system that protects the body against disease such as harmful bacteria, viruses, fungi, and other foreign invaders. When the immune system overacts, it causes inflammation. The cause of the increased immune system activity is not known. This over activity causes long-lasting inflammation and ulceration. This condition may be passed down from your parents (inherited). Brothers, sisters, children, and parents of patients with IBD are more likely to develop these diseases. It is not contagious. This means you cannot catch it from someone else. DIAGNOSIS  Your caregiver may suspect ulcerative colitis based on your symptoms and exam. Blood tests may confirm that there is a problem. You may  be asked to submit a stool specimen for examination. X-rays and CT scans may be necessary. Ultimately, the diagnosis is usually made after a flexible tube is inserted via your anus and your colon is examined under sedation (colonoscopy). With this test, the specialist can take a tiny tissue sample from inside the bowel (biopsy). Examination of this biopsy tissue under a microscopy can reveal ulcerative colitis as the cause of your symptoms. TREATMENT   There is no cure for ulcerative colitis.  Complications such as massive bleeding from the colon (hemorrhage), development of a hole in the colon (perforation), or the development of precancerous or cancerous changes of the colon may require surgery.  Medications are often used to decrease inflammation and control the immune system. These include medicines related to aspirin, steroid medications, and newer and stronger medications to slow down the immune system. Some medications may be used as suppositories or enemas. A number of other medications are used or have been studied. Your caregiver will make specific recommendations. HOME CARE INSTRUCTIONS   There is no cure for ulcerative colitis disease. The best treatment is frequent checkups with your caregiver. Periodic reevaluation is important.  Symptoms such as diarrhea can be controlled with medications. Avoid foods that have a laxative effect such fresh fruit and vegetables and dairy products. During flare ups, you can rest your bowel by staying away from solid foods. Drink clear liquids frequently during the day. Electrolyte or rehydrating fluids are best. Your caregiver can help you with suggestions. Drink often to prevent dehydration. When diarrhea has cleared, eat smaller meals and more often. Avoid food additives and stimulants such as caffeine (coffee, tea, many sodas, or chocolate). Avoid dairy products. Enzyme supplements may help if you develop intolerance to  a sugar in dairy products  (lactose). Ask your caregiver or dietitian about specific dietary instructions.  If you had surgery, be sure you understand your care instructions thoroughly, including proper care of any surgical wounds.  Take any medications exactly as prescribed.  Try to maintain a positive attitude. Learn relaxation techniques such as self hypnosis, mental imaging, and muscle relaxation. If possible, avoid stresses that aggravate your condition. Exercise regularly. Follow your diet. Always get plenty of rest. SEEK MEDICAL CARE IF:   Your symptoms fail to improve after a week or two of new treatment.  You experience continued weight loss.  You have ongoing crampy digestion or loose bowels.  You develop a new skin rash, skin sores, or eye problems. SEEK IMMEDIATE MEDICAL CARE IF:   You have worsening of your symptoms or develop new symptoms.  You have an oral temperature above 102 F (38.9 C), not controlled by medicine.  You develop bloody diarrhea.  You have severe abdominal pain. Document Released: 12/17/2004 Document Revised: 06/01/2011 Document Reviewed: 11/16/2006 [facility] Patient Information 2014 [place], [place].

## 2013-04-13 NOTE — Telephone Encounter (Signed)
Patient called back , she prefers to come here for me to teach her. She is going to call the office once she gets the medication to make an appointment with me. She is currently on Prednisone 25 mg , will decrease to 20 next Monday. She ask if she is to take this until she tappers down while she is on the Humira.

## 2013-04-13 NOTE — Telephone Encounter (Signed)
Returning [name]'s call from [phone]. Patient called while we were at lunch.

## 2013-04-14 NOTE — Telephone Encounter (Signed)
Patient was called and made aware that per Dr.Rehman, we will keep her on the Prednisone a few days with the humira to see how she does. She states this morning that the Humira is over $2800.00 . She has ask for assistance. We rec'd our part this morning and she will be getting hers in the upcoming days. She will complete her and we will do ours and fax them to [facility].

## 2013-04-15 LAB — NMR, LIPOPROFILE
Cholesterol: 261 mg/dL — ABNORMAL HIGH (ref <200)
HDL Cholesterol by NMR: 78 mg/dL (ref >=40)
HDL PARTICLE NUMBER: 45.2 umol/L (ref >=30.5)
LDL PARTICLE NUMBER: 2023 nmol/L — AB (ref <1000)
LDL SIZE: 21.2 nm (ref >20.5)
LDLC SERPL CALC-MCNC: 155 mg/dL — ABNORMAL HIGH (ref <100)
LP-IR SCORE: 35 (ref <=45)
Small LDL Particle Number: 786 nmol/L — ABNORMAL HIGH (ref <=527)
Triglycerides by NMR: 141 mg/dL (ref <150)

## 2013-04-15 LAB — CMP14+EGFR
ALBUMIN: 3.9 g/dL (ref 3.5–4.8)
ALK PHOS: 53 IU/L (ref 39–117)
ALT: 34 IU/L — ABNORMAL HIGH (ref 0–32)
AST: 24 IU/L (ref 0–40)
Albumin/Globulin Ratio: 2.1 (ref 1.1–2.5)
BUN / CREAT RATIO: 19 (ref 11–26)
BUN: 24 mg/dL (ref 8–27)
CHLORIDE: 96 mmol/L — AB (ref 97–108)
CO2: 26 mmol/L (ref 18–29)
Calcium: 8.6 mg/dL — ABNORMAL LOW (ref 8.7–10.3)
Creatinine, Ser: 1.28 mg/dL — ABNORMAL HIGH (ref 0.57–1.00)
GFR calc non Af Amer: 42 mL/min/{1.73_m2} — ABNORMAL LOW (ref >59)
GFR, EST AFRICAN AMERICAN: 49 mL/min/{1.73_m2} — AB (ref >59)
Globulin, Total: 1.9 g/dL (ref 1.5–4.5)
Glucose: 212 mg/dL — ABNORMAL HIGH (ref 65–99)
Potassium: 4.3 mmol/L (ref 3.5–5.2)
SODIUM: 137 mmol/L (ref 134–144)
Total Bilirubin: 0.7 mg/dL (ref 0.0–1.2)
Total Protein: 5.8 g/dL — ABNORMAL LOW (ref 6.0–8.5)

## 2013-04-17 ENCOUNTER — Other Ambulatory Visit (INDEPENDENT_AMBULATORY_CARE_PROVIDER_SITE_OTHER): Payer: Self-pay | Admitting: Internal Medicine

## 2013-04-17 ENCOUNTER — Other Ambulatory Visit: Payer: Self-pay | Admitting: Nurse Practitioner

## 2013-04-17 LAB — POCT GLYCOSYLATED HEMOGLOBIN (HGB A1C): Hemoglobin A1C: 8

## 2013-04-17 MED ORDER — ATORVASTATIN CALCIUM 40 MG PO TABS: 40.0000 mg | ORAL_TABLET | Freq: Every day | ORAL | Status: DC

## 2013-04-17 NOTE — Addendum Note (Signed)
Addended by: Pollyann Kennedy F on: 04/17/2013 05:05 PM   Modules accepted: Orders

## 2013-04-18 ENCOUNTER — Telehealth: Payer: Self-pay | Admitting: *Deleted

## 2013-04-19 ENCOUNTER — Ambulatory Visit (INDEPENDENT_AMBULATORY_CARE_PROVIDER_SITE_OTHER): Payer: Medicare Other | Admitting: Nurse Practitioner

## 2013-04-19 ENCOUNTER — Encounter: Payer: Self-pay | Admitting: Nurse Practitioner

## 2013-04-19 VITALS — BP 125/79 | HR 93 | Temp 97.4°F | Ht 68.5 in | Wt 157.0 lb

## 2013-04-19 DIAGNOSIS — IMO0001 Reserved for inherently not codable concepts without codable children: Secondary | ICD-10-CM

## 2013-04-19 DIAGNOSIS — E1165 Type 2 diabetes mellitus with hyperglycemia: Principal | ICD-10-CM

## 2013-04-19 LAB — POCT UA - MICROALBUMIN: MICROALBUMIN (UR) POC: 20 mg/L

## 2013-04-19 MED ORDER — METFORMIN HCL 500 MG PO TABS: 500.0000 mg | ORAL_TABLET | Freq: Two times a day (BID) | ORAL | Status: DC

## 2013-04-19 MED ORDER — GLUCOSE BLOOD VI STRP: ORAL_STRIP | Status: DC

## 2013-04-19 NOTE — Addendum Note (Signed)
Addended by: Wyline Mood on: 04/19/2013 04:42 PM   Modules accepted: Orders

## 2013-04-19 NOTE — Progress Notes (Signed)
   Subjective:    Patient ID: [name], female    DOB: 1940/10/27, 73 y.o.   MRN: [id number]  HPI  Patient was seen on 04/13/13- labs showed hyperglycemia and hgba1c was 8.0%. SHe says that diabetes runs in her family.    Review of Systems  Constitutional: Positive for fatigue.  Respiratory: Negative.   Cardiovascular: Negative.   Gastrointestinal: Negative.   Neurological: Positive for dizziness and light-headedness.  All other systems reviewed and are negative.       Objective:   Physical Exam  Constitutional: She is oriented to person, place, and time. She appears well-developed and well-nourished.  Cardiovascular: Normal rate, regular rhythm and normal heart sounds.   Pulmonary/Chest: Effort normal and breath sounds normal.  Neurological: She is alert and oriented to person, place, and time.  Skin: Skin is warm.  Psychiatric: She has a normal mood and affect. Her behavior is normal. Judgment and thought content normal.   BP 125/79  Pulse 93  Temp(Src) 97.4 F (36.3 C) (Oral)  Ht 5' 8.5" (1.74 m)  Wt 157 lb (71.215 kg)  BMI 23.52 kg/m2        Assessment & Plan:   1. Type II or unspecified type diabetes mellitus without mention of complication, uncontrolled    Meds ordered this encounter  Medications  . ALPRAZolam (XANAX) 0.5 MG tablet    Sig:   . metFORMIN (GLUCOPHAGE) 500 MG tablet    Sig: Take 1 tablet (500 mg total) by mouth 2 (two) times daily with a meal.    Dispense:  180 tablet    Refill:  1    Order Specific Question:  Supervising Provider    Answer:  Laurance Flatten, DONALD W [[id number]]   Carb counting discussed- 45gm per meal and 15gm x2 snacks a day exercise encouraged Check blood sugar daily in AM fasting One touch verio glucose monitor- taught how to use Follow up in 3 months  [place], FNP

## 2013-04-19 NOTE — Patient Instructions (Signed)

## 2013-04-20 LAB — MICROALBUMIN, URINE: MICROALBUM., U, RANDOM: 26 ug/mL — AB (ref 0.0–17.0)

## 2013-05-01 ENCOUNTER — Encounter: Payer: Self-pay | Admitting: Nurse Practitioner

## 2013-05-01 DIAGNOSIS — E8881 Metabolic syndrome: Secondary | ICD-10-CM | POA: Insufficient documentation

## 2013-05-15 ENCOUNTER — Ambulatory Visit (INDEPENDENT_AMBULATORY_CARE_PROVIDER_SITE_OTHER): Payer: Medicare Other | Admitting: Internal Medicine

## 2013-05-17 ENCOUNTER — Ambulatory Visit (INDEPENDENT_AMBULATORY_CARE_PROVIDER_SITE_OTHER): Payer: Medicare Other | Admitting: Internal Medicine

## 2013-05-17 ENCOUNTER — Encounter (INDEPENDENT_AMBULATORY_CARE_PROVIDER_SITE_OTHER): Payer: Self-pay | Admitting: Internal Medicine

## 2013-05-17 VITALS — BP 100/54 | HR 88 | Temp 98.3°F | Ht 68.0 in | Wt 162.3 lb

## 2013-05-17 DIAGNOSIS — K5120 Ulcerative (chronic) proctitis without complications: Secondary | ICD-10-CM

## 2013-05-17 LAB — CBC WITH DIFFERENTIAL/PLATELET
BASOS ABS: 0.1 10*3/uL (ref 0.0–0.1)
Basophils Relative: 1 % (ref 0–1)
EOS PCT: 1 % (ref 0–5)
Eosinophils Absolute: 0.1 10*3/uL (ref 0.0–0.7)
HCT: 37.6 % (ref 36.0–46.0)
Hemoglobin: 12.5 g/dL (ref 12.0–15.0)
Lymphocytes Relative: 16 % (ref 12–46)
Lymphs Abs: 1.4 10*3/uL (ref 0.7–4.0)
MCH: 29.5 pg (ref 26.0–34.0)
MCHC: 33.2 g/dL (ref 30.0–36.0)
MCV: 88.7 fL (ref 78.0–100.0)
MONO ABS: 0.6 10*3/uL (ref 0.1–1.0)
MONOS PCT: 7 % (ref 3–12)
NEUTROS ABS: 6.4 10*3/uL (ref 1.7–7.7)
Neutrophils Relative %: 75 % (ref 43–77)
Platelets: 396 10*3/uL (ref 150–400)
RBC: 4.24 MIL/uL (ref 3.87–5.11)
RDW: 15.2 % (ref 11.5–15.5)
WBC: 8.5 10*3/uL (ref 4.0–10.5)

## 2013-05-17 NOTE — Patient Instructions (Signed)
Will notify patient when Humira is approved.

## 2013-05-17 NOTE — Progress Notes (Signed)
Subjective:     Patient ID: [name], female   DOB: 06/19/1940, 73 y.o.   MRN: [id number]  HPI  Hx of UC. Hospitalized at [facility] in December for a flare. She was treated with IV steroids. Stool studies were negative. Hepatitis B surface antigen and PPD were negative. We have requested Humira and request has been sent to her insurance. Diagnosed in [place] of 2008 with [place]. She has distal disease.  Last colonoscopy was in 2012 revealing cecal tubular adenoma and distal disease involving the rectum and half of the sigmoid.  She has tried Remicade in the past and states that it did not work. She has not responded to oral mesalamine and developed nausea and vomiting with azaathioprine. She has been treated multiple times with Prednisone. Recently Prednisone taper Sunday. Her BMs are formed except for days. She is having 2-3 stools a day. She denies seeing any blood.  She does have some left lower abdominal pain and rt abdominal pain.  She has nausea with the Metformin. There is no chills.  Appetite is okay. No weight loss.        Review of Systems Past Medical History  Diagnosis Date  . Hypertension   . Ulcerative colitis   . Fibromyalgia   . Ulcerative colitis   . Rectal bleeding   . Diarrhea   . Hyperglycemia, drug-induced 05/02/2011  . Anemia due to blood loss 05/02/2011  . Hypokalemia     Past Surgical History  Procedure Laterality Date  . Cholecystectomy      s/p  . Colonoscopy  06/16/06. 04/27/2007    proctitis seen, biopsies showed chronic active colitis, no dysplasia   . Abdominal hysterectomy      Allergies  Allergen Reactions  . Azathioprine Nausea And Vomiting and Other (See Comments)    SEVERE NAUSEA AND VOMITING WITH CHEST PAIN-  PATIENT INSISTS NEVER TO BE GIVEN ANYTHING SIMILAR TO THIS  . Tape Other (See Comments)    BLISTERS AND SKIN TEARING  . Codeine Nausea And Vomiting  . Penicillins Hives  . Povidone-Iodine Hives    BETADINE  . Procaine Hcl Other (See  Comments)    SEVERE GI UPSET (NAUSEA,VOMITING AND DIARRHEA)  . Sulfonamide Derivatives Hives  . Azithromycin     Mycins   . Dairy Aid [Lactase]   . Latex Rash  . Niacin And Related Rash    Current Outpatient Prescriptions on File Prior to Visit  Medication Sig Dispense Refill  . acetaminophen (TYLENOL) 500 MG tablet Take 500-1,000 mg by mouth every 6 (six) hours as needed. FOR PAIN      . ALPRAZolam (XANAX) 0.5 MG tablet       . antiseptic oral rinse (BIOTENE) LIQD 1 application by Mouth Rinse route as needed. FOR DRY MOUTH       . atorvastatin (LIPITOR) 40 MG tablet Take 1 tablet (40 mg total) by mouth daily.  90 tablet  1  . B Complex CAPS Take 1 capsule by mouth every morning.       . benazepril-hydrochlorthiazide (LOTENSIN HCT) 20-12.5 MG per tablet Take 1 tablet by mouth daily.  30 tablet  5  . clorazepate (TRANXENE) 3.75 MG tablet Take 1 tablet (3.75 mg total) by mouth 2 (two) times daily as needed for anxiety.  30 tablet  3  . co-enzyme Q-10 50 MG capsule Take 50 mg by mouth once a week.       . escitalopram (LEXAPRO) 20 MG tablet Take 20 mg by  mouth daily.      . fluconazole (DIFLUCAN) 100 MG tablet Take 1 tablet (100 mg total) by mouth daily.  7 tablet  0  . glucose blood (ONETOUCH VERIO) test strip Test 1 x per day and prn  Dx 250.02  100 each  12  . hydrocodone-acetaminophen (LORCET-HD) 5-500 MG per capsule Take 1 capsule by mouth every 6 (six) hours as needed for pain (Patient states that she only takes if absolutely needed).      Marland Kitchen loperamide (IMODIUM) 2 MG capsule Take 1 capsule (2 mg total) by mouth every 8 (eight) hours as needed for diarrhea or loose stools.  30 capsule  0  . metFORMIN (GLUCOPHAGE) 500 MG tablet Take 1 tablet (500 mg total) by mouth 2 (two) times daily with a meal.  180 tablet  1  . Multiple Vitamins-Minerals (WOMENS MULTI VITAMIN & MINERAL PO) Take 1 tablet by mouth every other day.       . Omega-3 Fatty Acids (FISH OIL) 1000 MG CPDR Take 1 capsule by  mouth every evening.       . ondansetron (ZOFRAN) 4 MG tablet Take 4 mg by mouth every 8 (eight) hours as needed for nausea or vomiting.      . ondansetron (ZOFRAN) 8 MG tablet TAKE 1/2 TABLET EVERY 4 HOURS AS NEEDED FOR NAUSEA  20 tablet  1  . predniSONE (DELTASONE) 20 MG tablet Take 1 tablet (20 mg total) by mouth 2 (two) times daily with a meal.  30 tablet  0  . Probiotic Product (PROBIOTIC FORMULA PO) Take 1-2 capsules by mouth every morning.       . traMADol (ULTRAM) 50 MG tablet Take 1 tablet (50 mg total) by mouth daily.  30 tablet  1  . vitamin A 10000 UNIT capsule Take 10,000 Units by mouth once a week.       . vitamin C (ASCORBIC ACID) 500 MG tablet Take 500 mg by mouth once a week.       . vitamin E 400 UNIT capsule Take 400 Units by mouth every morning.       . zolpidem (AMBIEN) 10 MG tablet Take 1 tablet (10 mg total) by mouth at bedtime as needed. FOR SLEEP  30 tablet  0  . Cholecalciferol (VITAMIN D3) 1000 UNITS CAPS Take 1 capsule by mouth every morning.       . [DISCONTINUED] sodium chloride (OCEAN) 0.65 % SOLN nasal spray Place 2 sprays into the nose as needed for congestion.       No current facility-administered medications on file prior to visit.        Objective:   Physical Exam  Filed Vitals:   05/17/13 1559  BP: 100/54  Pulse: 88  Temp: 98.3 F (36.8 C)  Height: 5' 8"  (1.727 m)  Weight: 162 lb 4.8 oz (73.619 kg)   Alert and oriented. Skin warm and dry. Oral mucosa is moist.   . Sclera anicteric, conjunctivae is pink. Thyroid not enlarged. No cervical lymphadenopathy. Lungs clear. Heart regular rate and rhythm.  Abdomen is soft. Bowel sounds are positive. No hepatomegaly. No abdominal masses felt.  Tenderness left lower abdomen and rt lower abdomen.  No edema to lower extremities.      Assessment:    Hx of UC. Recent admission to [facility].     Plan:    CBC, CRP Will began Humira as soon as soon as it is approved by her insurance. Dose would be 21m on days  1 and  2, 55m on day 15 and then 472mevery 2 weeks SQ. OV in 2 months with Dr. ReLaural Golden

## 2013-05-18 ENCOUNTER — Other Ambulatory Visit: Payer: Self-pay | Admitting: *Deleted

## 2013-05-18 ENCOUNTER — Ambulatory Visit (INDEPENDENT_AMBULATORY_CARE_PROVIDER_SITE_OTHER): Payer: Medicare Other | Admitting: Internal Medicine

## 2013-05-18 LAB — C-REACTIVE PROTEIN: CRP: 1.7 mg/dL — ABNORMAL HIGH (ref <0.60)

## 2013-05-24 ENCOUNTER — Telehealth: Payer: Self-pay | Admitting: Nurse Practitioner

## 2013-05-24 ENCOUNTER — Other Ambulatory Visit: Payer: Self-pay

## 2013-05-24 MED ORDER — ESCITALOPRAM OXALATE 20 MG PO TABS: 20.0000 mg | ORAL_TABLET | Freq: Every day | ORAL | Status: DC

## 2013-05-24 MED ORDER — LOPERAMIDE HCL 2 MG PO CAPS: 2.0000 mg | ORAL_CAPSULE | Freq: Three times a day (TID) | ORAL | Status: DC | PRN

## 2013-05-24 MED ORDER — HYDROCODONE-ACETAMINOPHEN 5-500 MG PO CAPS: 1.0000 | ORAL_CAPSULE | Freq: Four times a day (QID) | ORAL | Status: DC | PRN

## 2013-05-24 NOTE — Telephone Encounter (Signed)
rx ready for pick up up

## 2013-05-24 NOTE — Telephone Encounter (Signed)
Last seen 04/19/13  MMM if approved print and route to nurse

## 2013-05-25 NOTE — Telephone Encounter (Signed)
Patient aware rx ready to be picked up 

## 2013-07-06 ENCOUNTER — Telehealth (INDEPENDENT_AMBULATORY_CARE_PROVIDER_SITE_OTHER): Payer: Self-pay | Admitting: *Deleted

## 2013-07-06 NOTE — Telephone Encounter (Signed)
[name] has received a response from [facility], stating there are waiting on Dr. Olevia Perches report. She was advised her financial report has been received.

## 2013-07-07 NOTE — Telephone Encounter (Signed)
IInformation has been faxed to the requesting .

## 2013-07-12 ENCOUNTER — Telehealth (INDEPENDENT_AMBULATORY_CARE_PROVIDER_SITE_OTHER): Payer: Self-pay | Admitting: *Deleted

## 2013-07-12 NOTE — Telephone Encounter (Signed)
I have talked with [employer] and faxed tot hem what they are requesting. They are going to contact the pateint.

## 2013-07-13 ENCOUNTER — Other Ambulatory Visit (INDEPENDENT_AMBULATORY_CARE_PROVIDER_SITE_OTHER): Payer: Self-pay | Admitting: Internal Medicine

## 2013-07-14 ENCOUNTER — Ambulatory Visit: Payer: Medicare Other | Admitting: Nurse Practitioner

## 2013-07-15 ENCOUNTER — Other Ambulatory Visit (INDEPENDENT_AMBULATORY_CARE_PROVIDER_SITE_OTHER): Payer: Self-pay | Admitting: Internal Medicine

## 2013-07-17 ENCOUNTER — Other Ambulatory Visit: Payer: Self-pay

## 2013-07-17 MED ORDER — TRAMADOL HCL 50 MG PO TABS: 50.0000 mg | ORAL_TABLET | Freq: Every day | ORAL | Status: DC

## 2013-07-17 MED ORDER — ZOLPIDEM TARTRATE 10 MG PO TABS: 10.0000 mg | ORAL_TABLET | Freq: Every evening | ORAL | Status: DC | PRN

## 2013-07-17 MED ORDER — HYDROCODONE-ACETAMINOPHEN 5-500 MG PO CAPS: 1.0000 | ORAL_CAPSULE | Freq: Four times a day (QID) | ORAL | Status: DC | PRN

## 2013-07-17 NOTE — Telephone Encounter (Signed)
Last seen 04/19/13  MMM  IF approved print and route to nurse

## 2013-07-17 NOTE — Telephone Encounter (Signed)
Patient aware to pick up 

## 2013-07-17 NOTE — Telephone Encounter (Signed)
rx ready for pick up

## 2013-07-19 ENCOUNTER — Ambulatory Visit: Payer: Medicare Other | Admitting: Nurse Practitioner

## 2013-07-19 NOTE — Telephone Encounter (Signed)
May fill 5 additional refills per Dr.Rehman

## 2013-07-24 ENCOUNTER — Encounter (INDEPENDENT_AMBULATORY_CARE_PROVIDER_SITE_OTHER): Payer: Self-pay | Admitting: Internal Medicine

## 2013-07-24 ENCOUNTER — Ambulatory Visit (INDEPENDENT_AMBULATORY_CARE_PROVIDER_SITE_OTHER): Payer: Medicare Other | Admitting: Internal Medicine

## 2013-07-24 VITALS — BP 130/80 | HR 74 | Temp 97.8°F | Resp 18 | Ht 68.0 in | Wt 162.9 lb

## 2013-07-24 DIAGNOSIS — K5190 Ulcerative colitis, unspecified, without complications: Secondary | ICD-10-CM

## 2013-07-24 NOTE — Patient Instructions (Addendum)
Will start Humira next week when medication received. Will begin prednisone taper once Humira started and tolerated.

## 2013-07-24 NOTE — Progress Notes (Signed)
Presenting complaint;  Followup for ulcerative colitis.  Subjective:  Patient is 73 year old Caucasian female who is distal ulcerative colitis which has been difficult to control. She was hospitalized last Christmas and retreated with prednisone. Imuran was recommended and she believes she will receive prescription next week. Last week she began to have 10-12 loose stools per day and some rectal bleeding she also experienced lower abdominal cramps and urgency. She increase prednisone dose back to 20 mg daily and she feels better. Since she's been on prednisone 20 mg daily she is having 3-4 bowel movements per day and she has noted decrease in amount of blood she is passing per rectum. She remains with very good appetite. She denies weight loss. She does not take NSAIDs.        Current Medications: Outpatient Encounter Prescriptions as of 07/24/2013  Medication Sig  . acetaminophen (TYLENOL) 500 MG tablet Take 500-1,000 mg by mouth every 6 (six) hours as needed. FOR PAIN  . ALPRAZolam (XANAX) 0.5 MG tablet Take 0.5 mg by mouth 2 (two) times daily as needed.   Marland Kitchen antiseptic oral rinse (BIOTENE) LIQD 1 application by Mouth Rinse route as needed. FOR DRY MOUTH   . atorvastatin (LIPITOR) 40 MG tablet Take 1 tablet (40 mg total) by mouth daily.  . B Complex CAPS Take 1 capsule by mouth every morning.   . benazepril-hydrochlorthiazide (LOTENSIN HCT) 20-12.5 MG per tablet Take 1 tablet by mouth daily.  . Cholecalciferol (VITAMIN D3) 1000 UNITS CAPS Take 1 capsule by mouth every morning.   . clorazepate (TRANXENE) 3.75 MG tablet Take 1 tablet (3.75 mg total) by mouth 2 (two) times daily as needed for anxiety.  Marland Kitchen co-enzyme Q-10 50 MG capsule Take 50 mg by mouth once a week.   . diphenhydrAMINE (BENADRYL) 25 MG tablet Take 25 mg by mouth at bedtime as needed for sleep.  Marland Kitchen escitalopram (LEXAPRO) 20 MG tablet Take 1 tablet (20 mg total) by mouth daily.  . fluconazole (DIFLUCAN) 100 MG tablet Take 1  tablet (100 mg total) by mouth daily.  Marland Kitchen glucose blood (ONETOUCH VERIO) test strip Test 1 x per day and prn  Dx 250.02  . hydrocodone-acetaminophen (LORCET-HD) 5-500 MG per capsule Take 1 capsule by mouth every 6 (six) hours as needed for pain (Patient states that she only takes if absolutely needed).  Marland Kitchen loperamide (IMODIUM) 2 MG capsule Take 1 capsule (2 mg total) by mouth every 8 (eight) hours as needed for diarrhea or loose stools.  . metFORMIN (GLUCOPHAGE) 500 MG tablet Take 1 tablet (500 mg total) by mouth 2 (two) times daily with a meal.  . Multiple Vitamins-Minerals (WOMENS MULTI VITAMIN & MINERAL PO) Take 1 tablet by mouth every other day.   . Omega-3 Fatty Acids (FISH OIL) 1000 MG CPDR Take 1 capsule by mouth every evening.   . ondansetron (ZOFRAN) 4 MG tablet Take 4 mg by mouth every 8 (eight) hours as needed for nausea or vomiting.  . ondansetron (ZOFRAN) 8 MG tablet TAKE 1/2 TABLET EVERY 4 HOURS AS NEEDED FOR NAUSEA  . predniSONE (DELTASONE) 20 MG tablet Take 1 tablet (20 mg total) by mouth 2 (two) times daily with a meal.  . Probiotic Product (PROBIOTIC FORMULA PO) Take 1-2 capsules by mouth every morning.   . traMADol (ULTRAM) 50 MG tablet Take 1 tablet (50 mg total) by mouth daily.  . vitamin A 10000 UNIT capsule Take 10,000 Units by mouth once a week.   . vitamin C (ASCORBIC  ACID) 500 MG tablet Take 500 mg by mouth once a week.   . vitamin E 400 UNIT capsule Take 400 Units by mouth every morning.   . zolpidem (AMBIEN) 10 MG tablet Take 1 tablet (10 mg total) by mouth at bedtime as needed. FOR SLEEP     Objective: Blood pressure 130/80, pulse 74, temperature 97.8 F (36.6 C), temperature source Oral, resp. rate 18, height 5' 8"  (1.727 m), weight 162 lb 14.4 oz (73.891 kg). Patient is alert and in no acute distress. Conjunctiva is pink. Sclera is nonicteric Oropharyngeal mucosa is normal. No neck masses or thyromegaly noted. Cardiac exam with regular rhythm normal S1 and S2.  No murmur or gallop noted. Lungs are clear to auscultation. Abdomen is full. Bowel sounds are normal. On palpation she has mild tenderness in the epigastrium. No organomegaly or masses. No LE edema or clubbing noted.   Assessment:  #1. Distal ulcerative colitis which has been unresponsive to oral or topical mesalamine and she's been intolerant of 6-MP. We have been trying to get Humira approved since January this year and finally disappears she will get the medication next week. She is back on prednisone for symptom flare but hopefully she can come off prednisone for good when Humira begun.   Plan:  Patient will continue prednisone at 20 mg by mouth she will begin Humira next week(hopefully) and if she is able to tolerated Will begin prednisone taper week after next. She'll drop those off 5 mg every week. Office visit in 3 months.

## 2013-07-28 ENCOUNTER — Other Ambulatory Visit: Payer: Self-pay | Admitting: Nurse Practitioner

## 2013-08-01 ENCOUNTER — Other Ambulatory Visit: Payer: Self-pay

## 2013-08-01 MED ORDER — ESCITALOPRAM OXALATE 20 MG PO TABS: 20.0000 mg | ORAL_TABLET | Freq: Every day | ORAL | Status: DC

## 2013-08-01 NOTE — Telephone Encounter (Signed)
Last seen 04/19/13  MMM

## 2013-08-02 ENCOUNTER — Telehealth (INDEPENDENT_AMBULATORY_CARE_PROVIDER_SITE_OTHER): Payer: Self-pay | Admitting: *Deleted

## 2013-08-02 NOTE — Telephone Encounter (Signed)
We rec'd word on yesterday that we would need to call this prescription into this pharmacy. The number provided was [phone]. I called the prescription in as follows: Humira Day 1 80 mg SQ, Humira Day 2 80 mg , Humira Day 14 40 mg SQ, Humira Day 15 40 mg SQ. The patient then will adminster Humira 40 mg SQ every 2 weeks. 3 months with 3 refills.  This was verbally given to DIMA,Pharmacist. Patient was called and made aware.

## 2013-08-03 ENCOUNTER — Ambulatory Visit (INDEPENDENT_AMBULATORY_CARE_PROVIDER_SITE_OTHER): Payer: Medicare Other

## 2013-08-04 ENCOUNTER — Other Ambulatory Visit: Payer: Self-pay | Admitting: *Deleted

## 2013-08-04 MED ORDER — CLORAZEPATE DIPOTASSIUM 3.75 MG PO TABS: 3.7500 mg | ORAL_TABLET | Freq: Two times a day (BID) | ORAL | Status: DC | PRN

## 2013-08-04 NOTE — Telephone Encounter (Signed)
Please call in clorazepate with 1 refills

## 2013-08-04 NOTE — Telephone Encounter (Signed)
Last filled 07/07/13, last seen 04/19/13. Call into [facility]

## 2013-08-07 NOTE — Telephone Encounter (Signed)
Called in.

## 2013-08-10 ENCOUNTER — Telehealth (INDEPENDENT_AMBULATORY_CARE_PROVIDER_SITE_OTHER): Payer: Self-pay | Admitting: *Deleted

## 2013-08-10 ENCOUNTER — Ambulatory Visit (INDEPENDENT_AMBULATORY_CARE_PROVIDER_SITE_OTHER): Payer: Medicare Other

## 2013-08-10 NOTE — Telephone Encounter (Signed)
Patient presented to the office for Humira Teaching and injections Day 1 80 mg. Blood Pressure 128/74 , Pulse 76, Respirations 18 ., Temp 97.1 Weight 165.1 lbs Patient tolerated well. Calendars were given to her to go by. She demonstrated understanding. She is also currently taking Prednisone 10 mg (started 08-10-13).  Terri Setzer,NP presented to room and talked with the patient Dr.Rehman made aware.

## 2013-08-10 NOTE — Telephone Encounter (Signed)
Humira Pen - Exp: [id number] - Lot [id number]

## 2013-08-10 NOTE — Telephone Encounter (Signed)
Patient received first dose of Humira today

## 2013-08-21 ENCOUNTER — Encounter: Payer: Self-pay | Admitting: Nurse Practitioner

## 2013-08-21 ENCOUNTER — Ambulatory Visit (INDEPENDENT_AMBULATORY_CARE_PROVIDER_SITE_OTHER): Payer: Medicare Other | Admitting: Nurse Practitioner

## 2013-08-21 VITALS — BP 130/81 | HR 80 | Temp 97.5°F | Ht 68.0 in | Wt 166.0 lb

## 2013-08-21 DIAGNOSIS — G4700 Insomnia, unspecified: Secondary | ICD-10-CM

## 2013-08-21 DIAGNOSIS — K5190 Ulcerative colitis, unspecified, without complications: Secondary | ICD-10-CM

## 2013-08-21 DIAGNOSIS — I10 Essential (primary) hypertension: Secondary | ICD-10-CM

## 2013-08-21 DIAGNOSIS — F32A Depression, unspecified: Secondary | ICD-10-CM

## 2013-08-21 DIAGNOSIS — F411 Generalized anxiety disorder: Secondary | ICD-10-CM

## 2013-08-21 DIAGNOSIS — F3289 Other specified depressive episodes: Secondary | ICD-10-CM

## 2013-08-21 DIAGNOSIS — E1165 Type 2 diabetes mellitus with hyperglycemia: Secondary | ICD-10-CM

## 2013-08-21 DIAGNOSIS — F329 Major depressive disorder, single episode, unspecified: Secondary | ICD-10-CM

## 2013-08-21 DIAGNOSIS — E876 Hypokalemia: Secondary | ICD-10-CM

## 2013-08-21 DIAGNOSIS — M797 Fibromyalgia: Secondary | ICD-10-CM

## 2013-08-21 DIAGNOSIS — Z13820 Encounter for screening for osteoporosis: Secondary | ICD-10-CM

## 2013-08-21 DIAGNOSIS — IMO0001 Reserved for inherently not codable concepts without codable children: Secondary | ICD-10-CM

## 2013-08-21 LAB — POCT GLYCOSYLATED HEMOGLOBIN (HGB A1C): HEMOGLOBIN A1C: 6.8

## 2013-08-21 MED ORDER — HYDROCODONE-ACETAMINOPHEN 5-500 MG PO CAPS: 1.0000 | ORAL_CAPSULE | Freq: Four times a day (QID) | ORAL | Status: DC | PRN

## 2013-08-21 MED ORDER — TRAMADOL HCL 50 MG PO TABS: 50.0000 mg | ORAL_TABLET | Freq: Every day | ORAL | Status: DC

## 2013-08-21 MED ORDER — ZOLPIDEM TARTRATE 10 MG PO TABS: 10.0000 mg | ORAL_TABLET | Freq: Every evening | ORAL | Status: DC | PRN

## 2013-08-21 MED ORDER — CLORAZEPATE DIPOTASSIUM 3.75 MG PO TABS: 3.7500 mg | ORAL_TABLET | Freq: Three times a day (TID) | ORAL | Status: DC

## 2013-08-21 NOTE — Progress Notes (Signed)
Subjective:    Patient ID: [name], female    DOB: 06/10/1940, 73 y.o.   MRN: [id number]  Patient here today for follow up of chronic medical problems.  Hypertension This is a chronic problem. The current episode started more than 1 year ago. The problem is unchanged. The problem is controlled. Pertinent negatives include no blurred vision, chest pain, headaches, neck pain, orthopnea, palpitations, peripheral edema or shortness of breath. There are no associated agents to hypertension. Past treatments include ACE inhibitors and diuretics. The current treatment provides moderate improvement. Chronic renal disease: rung induced.  Hyperlipidemia This is a chronic problem. The current episode started more than 1 year ago. The problem is uncontrolled. Recent lipid tests were reviewed and are high. Exacerbating diseases include diabetes. She has no history of hypothyroidism or obesity. Chronic renal disease: rung induced. Pertinent negatives include no chest pain or shortness of breath. Compliance problems include adherence to diet, adherence to exercise and medication side effects (was gien lipitor but saidmade egshurt so she stopped taking.).   ulcerative colitis Dr. [name]- curremtly coming off of  prednisone- they have  started her on humiria- should be starting n the next couple of weeks. Drepression/GAD lexapor- working well to keep her from worrying so much. Takes xanax as needed at least BID- insurance no longer wants to pay for xanax so need to change. insomnia ambien but only takes occasionally- uses benadryl most of the time.   Review of Systems  Eyes: Negative for blurred vision.  Respiratory: Negative for shortness of breath.   Cardiovascular: Negative for chest pain, palpitations and orthopnea.  Gastrointestinal: Positive for nausea, diarrhea and constipation.  Musculoskeletal: Negative for neck pain.  Neurological: Negative for headaches.  All other systems reviewed and are  negative.      Objective:   Physical Exam  Constitutional: She is oriented to person, place, and time. She appears well-developed and well-nourished.  HENT:  Nose: Nose normal.  Mouth/Throat: Oropharynx is clear and moist.  Eyes: EOM are normal.  Neck: Trachea normal, normal range of motion and full passive range of motion without pain. Neck supple. No JVD present. Carotid bruit is not present. No thyromegaly present.  Cardiovascular: Normal rate, regular rhythm, normal heart sounds and intact distal pulses.  Exam reveals no gallop and no friction rub.   No murmur heard. Pulmonary/Chest: Effort normal and breath sounds normal.  Abdominal: Soft. Bowel sounds are normal. She exhibits no distension and no mass. There is tenderness (diffuse).  Musculoskeletal: Normal range of motion.  Lymphadenopathy:    She has no cervical adenopathy.  Neurological: She is alert and oriented to person, place, and time. She has normal reflexes.  Skin: Skin is warm and dry.  Psychiatric: She has a normal mood and affect. Her behavior is normal. Judgment and thought content normal.   BP 130/81  Pulse 80  Temp(Src) 97.5 F (36.4 C) (Oral)  Ht _0  (1.727 m)  Wt 166 lb (75.297 kg)  BMI 25.25 kg/m2  Results for orders placed in visit on 08/21/13  POCT GLYCOSYLATED HEMOGLOBIN (HGB A1C)      Result Value Ref Range   Hemoglobin A1C 6.8           Assessment & Plan:    1. HYPERTENSION   2. Type II or unspecified type diabetes mellitus without mention of complication, uncontrolled   3. Hypokalemia   4. Fibromyalgia   5. Depression   6. Screening for osteoporosis   7. GAD (generalized  anxiety disorder)   8. Insomnia   9. Ulcerative colitis    Orders Placed This Encounter  Procedures  . DG Bone Density    Standing Status: Future     Number of Occurrences:      Standing Expiration Date: 10/21/2014    Order Specific Question:  Reason for Exam (SYMPTOM  OR DIAGNOSIS REQUIRED)    Answer:   screening    Order Specific Question:  Preferred imaging location?    Answer:  Internal  . CMP14+EGFR  . NMR, lipoprofile  . POCT glycosylated hemoglobin (Hb A1C)   Meds ordered this encounter  Medications  . Adalimumab (HUMIRA PEN [place])    Sig: Inject into the skin.  . clorazepate (TRANXENE) 3.75 MG tablet    Sig: Take 1 tablet (3.75 mg total) by mouth 3 (three) times daily.    Dispense:  30 tablet    Refill:  1    Order Specific Question:  Supervising Provider    Answer:  Chipper Herb [[id number]]  . zolpidem (AMBIEN) 10 MG tablet    Sig: Take 1 tablet (10 mg total) by mouth at bedtime as needed. FOR SLEEP    Dispense:  30 tablet    Refill:  1    Order Specific Question:  Supervising Provider    Answer:  Chipper Herb [[id number]]  . traMADol (ULTRAM) 50 MG tablet    Sig: Take 1 tablet (50 mg total) by mouth daily.    Dispense:  30 tablet    Refill:  1    Order Specific Question:  Supervising Provider    Answer:  Chipper Herb [[id number]]  . hydrocodone-acetaminophen (LORCET-HD) 5-500 MG per capsule    Sig: Take 1 capsule by mouth every 6 (six) hours as needed for pain (Patient states that she only takes if absolutely needed).    Dispense:  30 capsule    Refill:  0    Order Specific Question:  Supervising Provider    Answer:  Joycelyn Man   Very concerned about the fact that she is on ultram and hydrocodone- realize that she is in a lot of pain from ulcerative coltis Labs pending Health maintenance reviewed Diet and exercise encouraged Continue all meds Follow up  In 3 months   [place], FNP

## 2013-08-21 NOTE — Patient Instructions (Signed)

## 2013-08-23 LAB — CMP14+EGFR
A/G RATIO: 1.7 (ref 1.1–2.5)
ALT: 15 IU/L (ref 0–32)
AST: 21 IU/L (ref 0–40)
Albumin: 3.8 g/dL (ref 3.5–4.8)
Alkaline Phosphatase: 56 IU/L (ref 39–117)
BUN / CREAT RATIO: 15 (ref 11–26)
BUN: 21 mg/dL (ref 8–27)
CALCIUM: 9.1 mg/dL (ref 8.7–10.3)
CO2: 24 mmol/L (ref 18–29)
CREATININE: 1.38 mg/dL — AB (ref 0.57–1.00)
Chloride: 102 mmol/L (ref 97–108)
GFR calc Af Amer: 44 mL/min/{1.73_m2} — ABNORMAL LOW (ref >59)
GFR, EST NON AFRICAN AMERICAN: 38 mL/min/{1.73_m2} — AB (ref >59)
Globulin, Total: 2.2 g/dL (ref 1.5–4.5)
Glucose: 149 mg/dL — ABNORMAL HIGH (ref 65–99)
Potassium: 4.9 mmol/L (ref 3.5–5.2)
SODIUM: 141 mmol/L (ref 134–144)
Total Bilirubin: 0.5 mg/dL (ref 0.0–1.2)
Total Protein: 6 g/dL (ref 6.0–8.5)

## 2013-08-23 LAB — NMR, LIPOPROFILE
Cholesterol: 203 mg/dL — ABNORMAL HIGH (ref 100–199)
HDL Cholesterol by NMR: 61 mg/dL (ref >39)
HDL PARTICLE NUMBER: 37.8 umol/L (ref >=30.5)
LDL Particle Number: 1116 nmol/L — ABNORMAL HIGH (ref <1000)
LDL Size: 21.4 nm (ref >20.5)
LDLC SERPL CALC-MCNC: 110 mg/dL — ABNORMAL HIGH (ref 0–99)
LP-IR Score: 30 (ref <=45)
SMALL LDL PARTICLE NUMBER: 371 nmol/L (ref <=527)
Triglycerides by NMR: 159 mg/dL — ABNORMAL HIGH (ref 0–149)

## 2013-09-20 ENCOUNTER — Telehealth (INDEPENDENT_AMBULATORY_CARE_PROVIDER_SITE_OTHER): Payer: Self-pay | Admitting: *Deleted

## 2013-09-20 NOTE — Telephone Encounter (Signed)
Would like to speak with [name]. [name] has some questions about her Humira. The return phone number is [phone].

## 2013-09-20 NOTE — Telephone Encounter (Signed)
Patient started Humira 08-10-13. She is experiencing these symptoms now. She has been having black stools for 4 days. Skin burning , red places, scaly , itching all over. Head aches , fever,sore throat, weakness , nausea. Patien's neck and Arm pits feel swollen,  Next injection is due 09/21/13. Patient was told not to inject anymore until she heard from [place].  Forwarded to [place] for review.

## 2013-09-20 NOTE — Telephone Encounter (Signed)
Per Dr.Rehman - Bring the patient in tomorrow. Patient will need to made aware.

## 2013-09-20 NOTE — Telephone Encounter (Signed)
She needs to be seen in the office

## 2013-09-20 NOTE — Telephone Encounter (Signed)
Patient has appointment 09-21-13 @ 2:30 pm.

## 2013-09-21 ENCOUNTER — Telehealth (INDEPENDENT_AMBULATORY_CARE_PROVIDER_SITE_OTHER): Payer: Self-pay | Admitting: *Deleted

## 2013-09-21 ENCOUNTER — Ambulatory Visit (INDEPENDENT_AMBULATORY_CARE_PROVIDER_SITE_OTHER): Payer: Medicare Other | Admitting: Internal Medicine

## 2013-09-21 NOTE — Telephone Encounter (Addendum)
[name] stating she will not be able to make her apt today, 09/21/13. She is feeling better but dizzy. Lana had two bowel movements that were not dark, no chills but doesn't feel like she should drive. Would like to know if she should still give herself the Humira injections. The return phone number is [phone].  Per Dr. [name] continue the Humira injections, take Tylenol for the next 3 days and if no better will work in next week. If her symptoms start again to go to the ED.

## 2013-09-21 NOTE — Telephone Encounter (Signed)
Patient was called and given Dr.Rehman's recommendations. She verbalized understanding and will administer Humira today. This will be on schedule.

## 2013-09-26 ENCOUNTER — Telehealth: Payer: Self-pay | Admitting: Nurse Practitioner

## 2013-09-26 DIAGNOSIS — K51911 Ulcerative colitis, unspecified with rectal bleeding: Secondary | ICD-10-CM

## 2013-09-26 MED ORDER — HYDROCODONE-ACETAMINOPHEN 5-500 MG PO CAPS: 1.0000 | ORAL_CAPSULE | Freq: Four times a day (QID) | ORAL | Status: DC | PRN

## 2013-09-26 NOTE — Telephone Encounter (Signed)
rx ready for pick up

## 2013-09-26 NOTE — Telephone Encounter (Signed)
Can pull up on lab tab and read her results- must have been sent to my chart

## 2013-09-26 NOTE — Telephone Encounter (Signed)
Patient aware of labs wants her hydrocodone refill

## 2013-09-27 NOTE — Telephone Encounter (Signed)
Up front patient aware

## 2013-10-02 ENCOUNTER — Telehealth: Payer: Self-pay | Admitting: Nurse Practitioner

## 2013-10-02 ENCOUNTER — Other Ambulatory Visit: Payer: Self-pay | Admitting: Nurse Practitioner

## 2013-10-03 ENCOUNTER — Other Ambulatory Visit: Payer: Self-pay | Admitting: *Deleted

## 2013-10-03 DIAGNOSIS — F411 Generalized anxiety disorder: Secondary | ICD-10-CM

## 2013-10-03 MED ORDER — CLORAZEPATE DIPOTASSIUM 3.75 MG PO TABS: 3.7500 mg | ORAL_TABLET | Freq: Three times a day (TID) | ORAL | Status: DC

## 2013-10-03 NOTE — Telephone Encounter (Signed)
ntbs to discuss

## 2013-10-03 NOTE — Telephone Encounter (Signed)
Appt scheduled. Patient aware.

## 2013-10-03 NOTE — Telephone Encounter (Signed)
Last refill 09/14/13. Last ov 08/21/13.  Call to [place] if approved. [phone].

## 2013-10-03 NOTE — Telephone Encounter (Signed)
Please call in clorazepate with 1 refills

## 2013-10-04 ENCOUNTER — Ambulatory Visit (INDEPENDENT_AMBULATORY_CARE_PROVIDER_SITE_OTHER): Payer: Medicare Other | Admitting: Nurse Practitioner

## 2013-10-04 ENCOUNTER — Encounter: Payer: Self-pay | Admitting: Nurse Practitioner

## 2013-10-04 VITALS — BP 150/84 | HR 105 | Temp 99.3°F | Ht 68.0 in | Wt 165.0 lb

## 2013-10-04 DIAGNOSIS — T50905A Adverse effect of unspecified drugs, medicaments and biological substances, initial encounter: Principal | ICD-10-CM

## 2013-10-04 DIAGNOSIS — L299 Pruritus, unspecified: Secondary | ICD-10-CM

## 2013-10-04 DIAGNOSIS — L298 Other pruritus: Secondary | ICD-10-CM

## 2013-10-04 MED ORDER — CLONAZEPAM 0.5 MG PO TABS: 0.5000 mg | ORAL_TABLET | Freq: Two times a day (BID) | ORAL | Status: DC

## 2013-10-04 NOTE — Progress Notes (Signed)
   Subjective:    Patient ID: [name], female    DOB: 06/23/1940, 73 y.o.   MRN: [id number]  HPI [place] in c/o itching and burning of bil lower ext. Started about the time that she started taking clorazepate. Seems to worsen after taking. She wants to change to something else- Was on xanax but insurance will not pay for it. Says that lower ext buring like they are on fire.    Review of Systems  Constitutional: Negative.   HENT: Negative.   Respiratory: Negative.   Cardiovascular: Negative.   Genitourinary: Negative.   Skin: Negative.   Neurological: Negative.   Psychiatric/Behavioral: Negative.   All other systems reviewed and are negative.      Objective:   Physical Exam  Constitutional: She is oriented to person, place, and time. She appears well-developed and well-nourished.  Cardiovascular: Normal rate, regular rhythm and normal heart sounds.   Pulmonary/Chest: Effort normal and breath sounds normal.  Neurological: She is alert and oriented to person, place, and time.  Skin: Skin is warm and dry. No rash noted. No erythema.  Psychiatric: She has a normal mood and affect. Her behavior is normal. Judgment and thought content normal.   BP 150/84  Pulse 105  Temp(Src) 99.3 F (37.4 C) (Oral)  Ht 5' 8"  (1.727 m)  Wt 165 lb (74.844 kg)  BMI 25.09 kg/m2        Assessment & Plan:   1. Itching due to drug    Meds ordered this encounter  Medications  . clonazePAM (KLONOPIN) 0.5 MG tablet    Sig: Take 1 tablet (0.5 mg total) by mouth 2 (two) times daily.    Dispense:  60 tablet    Refill:  1    Order Specific Question:  Supervising Provider    Answer:  Chipper Herb [[id number]]   Stop clorazepate RTO in 2 months for recheck  Mary-Margaret Hassell Done, FNP

## 2013-10-05 ENCOUNTER — Ambulatory Visit (INDEPENDENT_AMBULATORY_CARE_PROVIDER_SITE_OTHER): Payer: Medicare Other | Admitting: Internal Medicine

## 2013-10-05 ENCOUNTER — Ambulatory Visit (INDEPENDENT_AMBULATORY_CARE_PROVIDER_SITE_OTHER): Payer: Medicare Other | Admitting: Nurse Practitioner

## 2013-10-05 ENCOUNTER — Encounter (INDEPENDENT_AMBULATORY_CARE_PROVIDER_SITE_OTHER): Payer: Self-pay | Admitting: Internal Medicine

## 2013-10-05 ENCOUNTER — Telehealth: Payer: Self-pay | Admitting: *Deleted

## 2013-10-05 ENCOUNTER — Encounter: Payer: Self-pay | Admitting: Nurse Practitioner

## 2013-10-05 VITALS — BP 138/80 | HR 84 | Temp 97.7°F | Ht 68.0 in | Wt 163.4 lb

## 2013-10-05 VITALS — BP 140/90 | HR 91 | Temp 98.0°F | Ht 68.0 in | Wt 158.4 lb

## 2013-10-05 DIAGNOSIS — Z888 Allergy status to other drugs, medicaments and biological substances status: Secondary | ICD-10-CM

## 2013-10-05 DIAGNOSIS — K5190 Ulcerative colitis, unspecified, without complications: Secondary | ICD-10-CM

## 2013-10-05 DIAGNOSIS — Z789 Other specified health status: Secondary | ICD-10-CM

## 2013-10-05 MED ORDER — ESCITALOPRAM OXALATE 20 MG PO TABS: 20.0000 mg | ORAL_TABLET | Freq: Every day | ORAL | Status: DC

## 2013-10-05 NOTE — Progress Notes (Signed)
   Subjective:    Patient ID: [name], female    DOB: 01/12/1941, 73 y.o.   MRN: [id number]  HPI Patient was switched from clorazepate to klonopin yesterday because she sad that the clorazepate made her legs feel like they were burning- She took one klonopin yesterday and started feeling like she was burning all over- face felt swollen- no sob   * patient started on humira at end of may and symptoms started about that time.  Review of Systems  Constitutional: Negative.   HENT: Negative.   Respiratory: Negative.   Cardiovascular: Negative.   Genitourinary: Negative.   Neurological: Negative.   Psychiatric/Behavioral: Negative.   All other systems reviewed and are negative.      Objective:   Physical Exam  Constitutional: She is oriented to person, place, and time. She appears well-developed and well-nourished.  Cardiovascular: Normal rate, regular rhythm and normal heart sounds.   Pulmonary/Chest: Effort normal and breath sounds normal.  Neurological: She is alert and oriented to person, place, and time.  Skin: Skin is warm and dry.  Psychiatric: She has a normal mood and affect. Her behavior is normal. Judgment and thought content normal.   BP 140/90  Pulse 91  Temp(Src) 98 F (36.7 C) (Oral)  Ht 5' 8"  (1.727 m)  Wt 158 lb 6.4 oz (71.85 kg)  BMI 24.09 kg/m2  SpO2 96%        Assessment & Plan:   1. Medication intolerance    Hold clorazepate and klonopin for now Think that reaction is coming from humira and I have instructed patient to talk with Dr. [name] before taking next injection Meds ordered this encounter  Medications  . clorazepate (TRANXENE) 3.75 MG tablet    Sig: Take 3.75 mg by mouth 3 (three) times daily.  Marland Kitchen escitalopram (LEXAPRO) 20 MG tablet    Sig: Take 1 tablet (20 mg total) by mouth daily.    Dispense:  30 tablet    Refill:  5    Order Specific Question:  Supervising Provider    Answer:  Chipper Herb [[id number]]   Patient put back on  lexapro Follow up prn  Mary-Margaret Hassell Done, FNP

## 2013-10-05 NOTE — Telephone Encounter (Signed)
Patient called in stating that her body is burning and right hand is numb

## 2013-10-05 NOTE — Progress Notes (Signed)
Subjective:     Patient ID: [name], female   DOB: 01/29/1941, 73 y.o.   MRN: [id number]  HPI  Started the Humira 08/14/2013. Now Humira 40m once every 14 days. She is due today for her Humira injection. In the past she has been intolerant of all UC medications.  Today she presents with c/o that her legs feel like they are on fire. Her skin is dry.  Her legs have been burning for 2 months. Her Xanax was discontinued and she was started on Clorazepate 2 months ago.  Yesterday she was at PCP and this medication was changed to Clonazepam. She tells me the Clonazepam made her legs burn also.   She also says her face and tongue felt like they were on fire. She tells me she had fever and chills last month.  She tells me her diarrhea is better. Having about 3 stools a day. Her abdominal pain is better. Appetite is good. No weight loss.  She states sje actually  feels better since starting the Humira.   Last colonoscopy in 2012:  FINAL DIAGNOSES:  1. Examination performed to cecum.  2. Active disease with transition at 28 cm from anal margin. Multiple  ulcers involving the rectal mucosa and distal sigmoid colon.  3. Scattered diverticula throughout the colon.  4. Small cecal polyp cold biopsied and residual treated with APC.  5. A 10-mm polyp snared from the rectum.  6. Biopsies taken from mucosa of sigmoid colorectum.   Review of Systems Past Medical History  Diagnosis Date  . Hypertension   . Ulcerative colitis   . Fibromyalgia   . Ulcerative colitis   . Rectal bleeding   . Diarrhea   . Hyperglycemia, drug-induced 05/02/2011  . Anemia due to blood loss 05/02/2011  . Hypokalemia     Past Surgical History  Procedure Laterality Date  . Cholecystectomy      s/p  . Colonoscopy  06/16/06. 04/27/2007    proctitis seen, biopsies showed chronic active colitis, no dysplasia   . Abdominal hysterectomy      Allergies  Allergen Reactions  . Azathioprine Nausea And Vomiting and Other (See  Comments)    SEVERE NAUSEA AND VOMITING WITH CHEST PAIN-  PATIENT INSISTS NEVER TO BE GIVEN ANYTHING SIMILAR TO THIS  . Tape Other (See Comments)    BLISTERS AND SKIN TEARING  . Codeine Nausea And Vomiting  . Penicillins Hives  . Povidone-Iodine Hives    BETADINE  . Procaine Hcl Other (See Comments)    SEVERE GI UPSET (NAUSEA,VOMITING AND DIARRHEA)  . Sulfonamide Derivatives Hives  . Azithromycin     Mycins   . Clonazepam Other (See Comments)    Skin burning, insomnia, diarrhea.  . Dairy Aid [Lactase]   . Latex Rash  . Niacin And Related Rash    Current Outpatient Prescriptions on File Prior to Visit  Medication Sig Dispense Refill  . acetaminophen (TYLENOL) 500 MG tablet Take 500-1,000 mg by mouth every 6 (six) hours as needed. FOR PAIN      . Adalimumab (HUMIRA PEN [place]) Inject into the skin.      .Marland Kitchenantiseptic oral rinse (BIOTENE) LIQD 1 application by Mouth Rinse route as needed. FOR DRY MOUTH       . B Complex CAPS Take 1 capsule by mouth every morning.       . benazepril-hydrochlorthiazide (LOTENSIN HCT) 20-12.5 MG per tablet TAKE (1) TABLET BY MOUTH ONCE DAILY.  30 tablet  5  . Cholecalciferol (  VITAMIN D3) 1000 UNITS CAPS Take 1 capsule by mouth every morning.       . clorazepate (TRANXENE) 3.75 MG tablet Take 3.75 mg by mouth 3 (three) times daily.      Marland Kitchen co-enzyme Q-10 50 MG capsule Take 50 mg by mouth once a week.       . diphenhydrAMINE (BENADRYL) 25 MG tablet Take 25 mg by mouth at bedtime as needed for sleep.      Marland Kitchen escitalopram (LEXAPRO) 20 MG tablet Take 1 tablet (20 mg total) by mouth daily.  30 tablet  5  . glucose blood (ONETOUCH VERIO) test strip Test 1 x per day and prn  Dx 250.02  100 each  12  . hydrocodone-acetaminophen (LORCET-HD) 5-500 MG per capsule Take 1 capsule by mouth every 6 (six) hours as needed for pain (Patient states that she only takes if absolutely needed).  30 capsule  0  . loperamide (IMODIUM) 2 MG capsule Take 1 capsule (2 mg total) by mouth  every 8 (eight) hours as needed for diarrhea or loose stools.  30 capsule  0  . Multiple Vitamins-Minerals (WOMENS MULTI VITAMIN & MINERAL PO) Take 1 tablet by mouth every other day.       . Omega-3 Fatty Acids (FISH OIL) 1000 MG CPDR Take 1 capsule by mouth every evening.       . ondansetron (ZOFRAN) 4 MG tablet Take 4 mg by mouth every 8 (eight) hours as needed for nausea or vomiting.      . Probiotic Product (PROBIOTIC FORMULA PO) Take 1-2 capsules by mouth every morning.       . traMADol (ULTRAM) 50 MG tablet Take 1 tablet (50 mg total) by mouth daily.  30 tablet  1  . vitamin A 10000 UNIT capsule Take 10,000 Units by mouth once a week.       . vitamin C (ASCORBIC ACID) 500 MG tablet Take 500 mg by mouth once a week.       . vitamin E 400 UNIT capsule Take 400 Units by mouth every morning.       . zolpidem (AMBIEN) 10 MG tablet Take 1 tablet (10 mg total) by mouth at bedtime as needed. FOR SLEEP  30 tablet  1  . [DISCONTINUED] sodium chloride (OCEAN) 0.65 % SOLN nasal spray Place 2 sprays into the nose as needed for congestion.       No current facility-administered medications on file prior to visit.        Objective:   Physical Exam  Filed Vitals:   10/05/13 1625  BP: 138/80  Pulse: 84  Temp: 97.7 F (36.5 C)  Height: 5' 8"  (1.727 m)  Weight: 163 lb 6.4 oz (74.118 kg)   Alert and oriented. Skin warm and dry. Oral mucosa is moist.   . Sclera anicteric, conjunctivae is pink. Slight redness to her neck and left side of face. Thyroid not enlarged. No cervical lymphadenopathy. Lungs clear. Heart regular rate and rhythm.  Abdomen is soft. Bowel sounds are positive. No hepatomegaly. No abdominal masses felt. Slight mid abdominal (umblical) tenderness.  No edema to lower extremities.       Assessment:       Crohn's disease. Possible drug reaction. I did not see a rash. She did possible have some redness to her neck and face.  Doubt this is a reaction from the Humira.    I discussed  this case with Dr. [name].  Plan:     CBC.  OV in 8 weeks. If any problems call our office.

## 2013-10-05 NOTE — Patient Instructions (Signed)
CBC with diff. OV in 8 weeks.

## 2013-10-05 NOTE — Telephone Encounter (Signed)
Patient advised not to take any more and to take some benadryl. appt given with mmm to be seen for allergic reaction

## 2013-10-05 NOTE — Patient Instructions (Signed)
Adalimumab Injection What is this medicine? ADALIMUMAB (a dal AYE mu mab) is used to treat several types of arthritis. It is also used to treat Crohn's disease, ulcerative colitis, and plaque psoriasis. This medicine may be used for other purposes; ask your health care provider or pharmacist if you have questions. COMMON BRAND NAME(S): Humira What should I tell my health care provider before I take this medicine? They need to know if you have any of these conditions: -diabetes -heart disease -hepatitis B or history of hepatitis B infection -immune system problems -infection or history of infections -multiple sclerosis -recently received or scheduled to receive a vaccine -scheduled to have surgery -tuberculosis, a positive skin test for tuberculosis or have recently been in close contact with someone who has tuberculosis -an unusual reaction to adalimumab, other medicines, mannitol, latex, rubber, foods, dyes, or preservatives -pregnant or trying to get pregnant -breast-feeding How should I use this medicine? This medicine is for injection under the skin. You will be taught how to prepare and give this medicine. Use exactly as directed. Take your medicine at regular intervals. Do not take your medicine more often than directed. A special MedGuide will be given to you by the pharmacist with each prescription and refill. Be sure to read this information carefully each time. It is important that you put your used needles and syringes in a special sharps container. Do not put them in a trash can. If you do not have a sharps container, call your pharmacist or healthcare provider to get one. Talk to your pediatrician regarding the use of this medicine in children. While this drug may be prescribed for children as young as 2 years for selected conditions, precautions do apply. The manufacturer of the medicine offers free information to patients and their health care partners. Call [phone] for  more information. Overdosage: If you think you have taken too much of this medicine contact a poison control center or emergency room at once. NOTE: This medicine is only for you. Do not share this medicine with others. What if I miss a dose? If you miss a dose, take it as soon as you can. If it is almost time for your next dose, take only that dose. Do not take double or extra doses. Give the next dose when your next scheduled dose is due. Call your doctor or health care professional if you are not sure how to handle a missed dose. What may interact with this medicine? Do not take this medicine with any of the following medications: -abatacept -anakinra -etanercept -infliximab -live virus vaccines -rilonacept This medicine may also interact with the following medications: -vaccines This list may not describe all possible interactions. Give your health care provider a list of all the medicines, herbs, non-prescription drugs, or dietary supplements you use. Also tell them if you smoke, drink alcohol, or use illegal drugs. Some items may interact with your medicine. What should I watch for while using this medicine? Visit your doctor or health care professional for regular checks on your progress. Tell your doctor or healthcare professional if your symptoms do not start to get better or if they get worse. You will be tested for tuberculosis (TB) before you start this medicine. If your doctor prescribes any medicine for TB, you should start taking the TB medicine before starting this medicine. Make sure to finish the full course of TB medicine. Call your doctor or health care professional if you get a cold or other infection while receiving this  medicine. Do not treat yourself. This medicine may decrease your body's ability to fight infection. Talk to your doctor about your risk of cancer. You may be more at risk for certain types of cancers if you take this medicine. What side effects may I notice  from receiving this medicine? Side effects that you should report to your doctor or health care professional as soon as possible: -allergic reactions like skin rash, itching or hives, swelling of the face, lips, or tongue -breathing problems -changes in vision -chest pain -fever, chills, or any other sign of infection -numbness or tingling -red, scaly patches or raised bumps on the skin -swelling of the ankles -swollen lymph nodes in the neck, underarm, or groin areas -unexplained weight loss -unusual bleeding or bruising -unusually weak or tired Side effects that usually do not require medical attention (report to your doctor or health care professional if they continue or are bothersome): -headache -nausea -redness, itching, swelling, or bruising at site where injected This list may not describe all possible side effects. Call your doctor for medical advice about side effects. You may report side effects to FDA at 1-800-FDA-1088. Where should I keep my medicine? Keep out of the reach of children. Store in the original container and in the refrigerator between 2 and 8 degrees C (36 and 46 degrees F). Do not freeze. The product may be stored in a cool carrier with an ice pack, if needed. Protect from light. Throw away any unused medicine after the expiration date. NOTE: This sheet is a summary. It may not cover all possible information. If you have questions about this medicine, talk to your doctor, pharmacist, or health care provider.  2015, [employer]vier/Gold Standard. (2012-12-26 16:19:31)

## 2013-10-05 NOTE — Telephone Encounter (Signed)
Called in.

## 2013-10-06 LAB — CBC WITH DIFFERENTIAL/PLATELET
BASOS PCT: 1 % (ref 0–1)
Basophils Absolute: 0.1 10*3/uL (ref 0.0–0.1)
Eosinophils Absolute: 0.2 10*3/uL (ref 0.0–0.7)
Eosinophils Relative: 3 % (ref 0–5)
HEMATOCRIT: 37.3 % (ref 36.0–46.0)
HEMOGLOBIN: 12.3 g/dL (ref 12.0–15.0)
LYMPHS ABS: 1.6 10*3/uL (ref 0.7–4.0)
Lymphocytes Relative: 20 % (ref 12–46)
MCH: 27.6 pg (ref 26.0–34.0)
MCHC: 33 g/dL (ref 30.0–36.0)
MCV: 83.8 fL (ref 78.0–100.0)
MONO ABS: 0.6 10*3/uL (ref 0.1–1.0)
Monocytes Relative: 7 % (ref 3–12)
NEUTROS ABS: 5.6 10*3/uL (ref 1.7–7.7)
Neutrophils Relative %: 69 % (ref 43–77)
Platelets: 455 10*3/uL — ABNORMAL HIGH (ref 150–400)
RBC: 4.45 MIL/uL (ref 3.87–5.11)
RDW: 15.6 % — ABNORMAL HIGH (ref 11.5–15.5)
WBC: 8.1 10*3/uL (ref 4.0–10.5)

## 2013-10-09 ENCOUNTER — Telehealth: Payer: Self-pay | Admitting: Nurse Practitioner

## 2013-10-09 NOTE — Telephone Encounter (Signed)
Have you tried Klonopin?

## 2013-10-10 ENCOUNTER — Telehealth: Payer: Self-pay | Admitting: Nurse Practitioner

## 2013-10-10 MED ORDER — CLONAZEPAM 0.5 MG PO TABS: 0.5000 mg | ORAL_TABLET | Freq: Two times a day (BID) | ORAL | Status: DC | PRN

## 2013-10-10 NOTE — Telephone Encounter (Signed)
Please call in klonopin 0.51m 1 po bid #60  with 0 refills

## 2013-10-10 NOTE — Telephone Encounter (Signed)
Then she needs to come in to discuss

## 2013-10-10 NOTE — Telephone Encounter (Signed)
Ok

## 2013-10-10 NOTE — Telephone Encounter (Signed)
Patient is just going to wait

## 2013-10-10 NOTE — Telephone Encounter (Signed)
Patient aware and is willing to try

## 2013-10-10 NOTE — Telephone Encounter (Signed)
PAtient changed mind does not want this

## 2013-10-12 ENCOUNTER — Other Ambulatory Visit: Payer: Self-pay | Admitting: Nurse Practitioner

## 2013-10-12 MED ORDER — ALPRAZOLAM 0.5 MG PO TABS: 0.5000 mg | ORAL_TABLET | Freq: Two times a day (BID) | ORAL | Status: DC | PRN

## 2013-10-30 ENCOUNTER — Other Ambulatory Visit: Payer: Self-pay | Admitting: *Deleted

## 2013-10-30 ENCOUNTER — Other Ambulatory Visit (INDEPENDENT_AMBULATORY_CARE_PROVIDER_SITE_OTHER): Payer: Self-pay | Admitting: *Deleted

## 2013-10-30 ENCOUNTER — Telehealth (INDEPENDENT_AMBULATORY_CARE_PROVIDER_SITE_OTHER): Payer: Self-pay | Admitting: *Deleted

## 2013-10-30 DIAGNOSIS — R197 Diarrhea, unspecified: Secondary | ICD-10-CM

## 2013-10-30 MED ORDER — DIPHENOXYLATE-ATROPINE 2.5-0.025 MG PO TABS: 1.0000 | ORAL_TABLET | Freq: Three times a day (TID) | ORAL | Status: DC | PRN

## 2013-10-30 MED ORDER — TRAMADOL HCL 50 MG PO TABS: 50.0000 mg | ORAL_TABLET | Freq: Every day | ORAL | Status: DC

## 2013-10-30 NOTE — Telephone Encounter (Signed)
rx ready for pick up

## 2013-10-30 NOTE — Telephone Encounter (Signed)
per orders Dr.Rehman the patient needs to have a prescription called in for diarrhea.

## 2013-10-30 NOTE — Telephone Encounter (Signed)
Patient  Called in here Sunday leaving a detail message as if we are here on Sunday's   Called at 9:03 am  She is seeing a lot of blood again and lots of BM.  Wants to know what to do.  Please call

## 2013-10-30 NOTE — Telephone Encounter (Signed)
Patient has called again this morning and states she is having bad diarrhea for over a week,  Would like something called in to check it up --feels she can't come today without messing her pants if he wanted to see her.

## 2013-10-30 NOTE — Telephone Encounter (Signed)
Patient aware.

## 2013-10-30 NOTE — Telephone Encounter (Signed)
Per Dr.Rehman the patient states that patient should take Lomotil 2.5- 0.025 mg per tablet. Take 1 tablet my mouth 3 times daily as needed for diarrhea or loose stools. #90 no refills. This was called to [facility]. Patient made aware.

## 2013-10-30 NOTE — Telephone Encounter (Signed)
Last ov 10/05/13. Last refill 09/20/13. See allergies. Please print for pt to pickup if approved.

## 2013-10-31 ENCOUNTER — Other Ambulatory Visit: Payer: Self-pay | Admitting: Nurse Practitioner

## 2013-11-01 NOTE — Telephone Encounter (Signed)
rx ready for pick up no more refills without being seen

## 2013-11-01 NOTE — Telephone Encounter (Signed)
Pt just got RX for Tramadol 10/30/13. Last refill 09/26/13 on Norco and last ov 09/25/13. Pt had a different dose of Norco last month.

## 2013-11-02 NOTE — Telephone Encounter (Signed)
Patient aware to pick up 

## 2013-11-08 ENCOUNTER — Ambulatory Visit: Payer: Medicare Other

## 2013-11-08 ENCOUNTER — Telehealth: Payer: Self-pay | Admitting: Nurse Practitioner

## 2013-11-08 ENCOUNTER — Other Ambulatory Visit: Payer: Medicare Other

## 2013-11-21 ENCOUNTER — Ambulatory Visit (INDEPENDENT_AMBULATORY_CARE_PROVIDER_SITE_OTHER): Payer: Medicare Other | Admitting: Internal Medicine

## 2013-11-29 ENCOUNTER — Other Ambulatory Visit: Payer: Self-pay | Admitting: *Deleted

## 2013-11-29 MED ORDER — ALPRAZOLAM 0.5 MG PO TABS: 0.5000 mg | ORAL_TABLET | Freq: Two times a day (BID) | ORAL | Status: DC | PRN

## 2013-11-29 NOTE — Telephone Encounter (Signed)
Last ov 7/16/. Last refill 10/18/13. If approved call to [facility].

## 2013-11-29 NOTE — Telephone Encounter (Signed)
Please call in xanax with 1 refills 

## 2013-11-30 ENCOUNTER — Other Ambulatory Visit: Payer: Self-pay | Admitting: *Deleted

## 2013-11-30 ENCOUNTER — Other Ambulatory Visit: Payer: Self-pay | Admitting: Nurse Practitioner

## 2013-11-30 ENCOUNTER — Telehealth: Payer: Self-pay | Admitting: Nurse Practitioner

## 2013-11-30 DIAGNOSIS — K51911 Ulcerative colitis, unspecified with rectal bleeding: Secondary | ICD-10-CM

## 2013-11-30 DIAGNOSIS — G4700 Insomnia, unspecified: Secondary | ICD-10-CM

## 2013-11-30 MED ORDER — ZOLPIDEM TARTRATE 10 MG PO TABS: 10.0000 mg | ORAL_TABLET | Freq: Every evening | ORAL | Status: DC | PRN

## 2013-11-30 MED ORDER — HYDROCODONE-ACETAMINOPHEN 5-500 MG PO CAPS: 1.0000 | ORAL_CAPSULE | Freq: Four times a day (QID) | ORAL | Status: DC | PRN

## 2013-11-30 NOTE — Telephone Encounter (Signed)
Xanax, called to [facility].

## 2013-11-30 NOTE — Telephone Encounter (Signed)
Please call in ambien with 1 refills 

## 2013-11-30 NOTE — Telephone Encounter (Signed)
Last filled 10/26/13, last seen 10/05/13. Call into [facility]

## 2013-12-01 NOTE — Progress Notes (Signed)
Patient aware.

## 2013-12-01 NOTE — Telephone Encounter (Signed)
[place] requested refill. Called into [facility] and left on their voicemail.

## 2013-12-05 ENCOUNTER — Other Ambulatory Visit: Payer: Self-pay | Admitting: *Deleted

## 2013-12-05 NOTE — Telephone Encounter (Signed)
Just received RX 11/30/13 for Hydrocodone. Please review. If approved print rx. Last refill 10/31/13.

## 2013-12-06 ENCOUNTER — Ambulatory Visit: Payer: Medicare Other | Admitting: Nurse Practitioner

## 2013-12-06 MED ORDER — TRAMADOL HCL 50 MG PO TABS: 50.0000 mg | ORAL_TABLET | Freq: Every day | ORAL | Status: DC

## 2013-12-06 NOTE — Telephone Encounter (Signed)
rx ready for pick up

## 2013-12-07 NOTE — Telephone Encounter (Signed)
Patient aware.

## 2013-12-14 ENCOUNTER — Telehealth: Payer: Self-pay | Admitting: Nurse Practitioner

## 2013-12-14 ENCOUNTER — Telehealth: Payer: Self-pay | Admitting: Family Medicine

## 2013-12-14 NOTE — Telephone Encounter (Signed)
whne runs out of ultram will do

## 2013-12-14 NOTE — Telephone Encounter (Signed)
Patient wants to know if she stops ultram will you just up the hydrocodone to #60

## 2013-12-14 NOTE — Telephone Encounter (Signed)
Patient wants you to go set up to referral

## 2013-12-14 NOTE — Telephone Encounter (Signed)
Have only been getting #30 for several months- cannot.p get more because she is on ultram as well- wll be glad to refer to pain clinic

## 2013-12-14 NOTE — Telephone Encounter (Signed)
She needs her hydrocodone at least for number 60 she is getting really upset that it keeps getting messed up?

## 2013-12-15 NOTE — Telephone Encounter (Signed)
Aware, must finish ultram before she can get hydrocodone refill,  (dose to be increased ).

## 2013-12-19 ENCOUNTER — Telehealth: Payer: Self-pay | Admitting: Family Medicine

## 2013-12-19 NOTE — Telephone Encounter (Signed)
Patient doesn't have any hydrocodone left from most recent prescription. Advised that although it is written for every 6 hrs PRN she doesn't have to take it every 6 hours. She said that she needs to take it every 6 hours. She is not out of tramadol yet and is aware that we won't change her hydrocodone directions until she is out of Tramadol.  She states that she needs a new prescription for hydrocodone to take along with the tramadol because she won't be out of tramadol for 2 weeks.  Aware that [place] Lewandowsky is on vacation and she may not get a response today.

## 2013-12-20 NOTE — Telephone Encounter (Signed)
No finish ultram and then we will refill hydrocodone- would really like for patient to see pain management

## 2013-12-20 NOTE — Telephone Encounter (Signed)
Patient aware.

## 2013-12-21 ENCOUNTER — Telehealth (INDEPENDENT_AMBULATORY_CARE_PROVIDER_SITE_OTHER): Payer: Self-pay | Admitting: *Deleted

## 2013-12-21 NOTE — Telephone Encounter (Signed)
[name]- per Dr.Rehman the patient needs to have a office visit to determine if pain is related to the Humira.

## 2013-12-21 NOTE — Telephone Encounter (Signed)
[name] is having abd pain and thinks it is a side effect to the Humira. Would like to know if there is something that could help with this. The return phone number is [phone].

## 2013-12-21 NOTE — Telephone Encounter (Signed)
She needs office visit to decide if her abdominal pain is due to medication or secondary to UC Please call patient

## 2013-12-21 NOTE — Telephone Encounter (Signed)
Forwarded to Dr.Rehman to advise.

## 2013-12-22 NOTE — Telephone Encounter (Signed)
[name] was offered an apt with [facility] on 12/25/13. She wishes to keep the apt on 01/01/14 with Dr. [name].

## 2013-12-28 ENCOUNTER — Encounter: Payer: Self-pay | Admitting: Nurse Practitioner

## 2013-12-28 ENCOUNTER — Ambulatory Visit (INDEPENDENT_AMBULATORY_CARE_PROVIDER_SITE_OTHER): Payer: Medicare Other | Admitting: Nurse Practitioner

## 2013-12-28 VITALS — BP 129/78 | HR 88 | Temp 97.1°F | Ht 68.0 in | Wt 159.2 lb

## 2013-12-28 DIAGNOSIS — F329 Major depressive disorder, single episode, unspecified: Secondary | ICD-10-CM

## 2013-12-28 DIAGNOSIS — E1310 Other specified diabetes mellitus with ketoacidosis without coma: Secondary | ICD-10-CM

## 2013-12-28 DIAGNOSIS — F32A Depression, unspecified: Secondary | ICD-10-CM

## 2013-12-28 DIAGNOSIS — M797 Fibromyalgia: Secondary | ICD-10-CM

## 2013-12-28 DIAGNOSIS — I10 Essential (primary) hypertension: Secondary | ICD-10-CM

## 2013-12-28 DIAGNOSIS — E876 Hypokalemia: Secondary | ICD-10-CM

## 2013-12-28 DIAGNOSIS — K5150 Left sided colitis without complications: Secondary | ICD-10-CM

## 2013-12-28 DIAGNOSIS — G4700 Insomnia, unspecified: Secondary | ICD-10-CM

## 2013-12-28 DIAGNOSIS — E1110 Type 2 diabetes mellitus with ketoacidosis without coma: Secondary | ICD-10-CM

## 2013-12-28 DIAGNOSIS — Z13820 Encounter for screening for osteoporosis: Secondary | ICD-10-CM

## 2013-12-28 LAB — POCT GLYCOSYLATED HEMOGLOBIN (HGB A1C): Hemoglobin A1C: 6.3

## 2013-12-28 MED ORDER — ALPRAZOLAM 0.5 MG PO TABS: 0.5000 mg | ORAL_TABLET | Freq: Three times a day (TID) | ORAL | Status: DC | PRN

## 2013-12-28 MED ORDER — ZOLPIDEM TARTRATE 10 MG PO TABS: 10.0000 mg | ORAL_TABLET | Freq: Every evening | ORAL | Status: DC | PRN

## 2013-12-28 MED ORDER — HYDROCODONE-ACETAMINOPHEN 5-325 MG PO TABS: ORAL_TABLET | ORAL | Status: DC

## 2013-12-28 NOTE — Addendum Note (Signed)
Addended by: [name] on: 12/28/2013 11:55 AM   Modules accepted: Orders

## 2013-12-28 NOTE — Patient Instructions (Signed)

## 2013-12-28 NOTE — Progress Notes (Signed)
Subjective:    Patient ID: [name], female    DOB: 08/14/1940, 73 y.o.   MRN: [id number]  Patient here today for follow up of chronic medical problems. No complaints today  Hypertension This is a chronic problem. The current episode started more than 1 year ago. The problem is unchanged. The problem is controlled. Pertinent negatives include no blurred vision, chest pain, headaches, neck pain, orthopnea, palpitations, peripheral edema or shortness of breath. There are no associated agents to hypertension. Past treatments include ACE inhibitors and diuretics. The current treatment provides moderate improvement. Chronic renal disease: rung induced.  Hyperlipidemia This is a chronic problem. The current episode started more than 1 year ago. The problem is uncontrolled. Recent lipid tests were reviewed and are high. Exacerbating diseases include diabetes. She has no history of hypothyroidism or obesity. Chronic renal disease: rung induced. Pertinent negatives include no chest pain or shortness of breath. Compliance problems include adherence to diet, adherence to exercise and medication side effects (was gien lipitor but saidmade egshurt so she stopped taking.).   Diabetes She presents for her follow-up diabetic visit. She has type 2 diabetes mellitus. No MedicAlert identification noted. Her disease course has been stable. Pertinent negatives for hypoglycemia include no headaches. Pertinent negatives for diabetes include no blurred vision and no chest pain. There are no hypoglycemic complications. There are no diabetic complications. Risk factors for coronary artery disease include dyslipidemia, family history, hypertension, obesity and stress. Current diabetic treatment includes diet. She is compliant with treatment most of the time. She has not had a previous visit with a dietician. She rarely participates in exercise. (Does not check blood sugars at home.) An ACE inhibitor/angiotensin II receptor  blocker is being taken. She does not see a podiatrist.Eye exam is not current.  ulcerative colitis Dr. [name]- curremtly coming off of  prednisone- they have  started her on humiria- should be starting n the next couple of weeks. Drepression/GAD lexapor- working well to keep her from worrying so much. Takes xanax as needed at least BID- insurance no longer wants to pay for xanax so need to change. insomnia ambien but only takes occasionally- uses benadryl most of the time.   Review of Systems  Eyes: Negative for blurred vision.  Respiratory: Negative for shortness of breath.   Cardiovascular: Negative for chest pain, palpitations and orthopnea.  Gastrointestinal: Positive for nausea, diarrhea and constipation.  Musculoskeletal: Negative for neck pain.  Neurological: Negative for headaches.  All other systems reviewed and are negative.      Objective:   Physical Exam  Constitutional: She is oriented to person, place, and time. She appears well-developed and well-nourished.  HENT:  Nose: Nose normal.  Mouth/Throat: Oropharynx is clear and moist.  Eyes: EOM are normal.  Neck: Trachea normal, normal range of motion and full passive range of motion without pain. Neck supple. No JVD present. Carotid bruit is not present. No thyromegaly present.  Cardiovascular: Normal rate, regular rhythm, normal heart sounds and intact distal pulses.  Exam reveals no gallop and no friction rub.   No murmur heard. Pulmonary/Chest: Effort normal and breath sounds normal.  Abdominal: Soft. Bowel sounds are normal. She exhibits no distension and no mass. There is tenderness (diffuse).  Musculoskeletal: Normal range of motion.  Lymphadenopathy:    She has no cervical adenopathy.  Neurological: She is alert and oriented to person, place, and time. She has normal reflexes.  Skin: Skin is warm and dry.  Psychiatric: She has a normal mood  and affect. Her behavior is normal. Judgment and thought content normal.    BP 129/78  Pulse 88  Temp(Src) 97.1 F (36.2 C) (Oral)  Ht 5' 8"  (1.727 m)  Wt 159 lb 3.2 oz (72.213 kg)  BMI 24.21 kg/m2  Results for orders placed in visit on 12/28/13  POCT GLYCOSYLATED HEMOGLOBIN (HGB A1C)      Result Value Ref Range   Hemoglobin A1C 6.3%           Assessment & Plan:   1. Essential hypertension Low NA+ diet - MKT37+BCAF  2.Metabolic syndrome Watch carbs in diet - POCT glycosylated hemoglobin (Hb A1C) - NMR, lipoprofile  3. ULCERATIVE COLITIS, LEFT SIDED - HYDROcodone-acetaminophen (NORCO/VICODIN) 5-325 MG per tablet; TAKE ONE TABLET BY MOUTH EVERY SIX HOURS AS NEEDED FOR PAIN  Dispense: 60 tablet; Refill: 0  4. Hypokalemia  5. Fibromyalgia exercse  6. Depression Stress management - ALPRAZolam (XANAX) 0.5 MG tablet; Take 1 tablet (0.5 mg total) by mouth 3 (three) times daily as needed for anxiety.  Dispense: 90 tablet; Refill: 1  7. Screening for osteoporosis - DG Bone Density; Future  8. Insomnia Bedtime ritual - zolpidem (AMBIEN) 10 MG tablet; Take 1 tablet (10 mg total) by mouth at bedtime as needed. FOR SLEEP  Dispense: 30 tablet; Refill: 1   NO more tramadol if going to take lortabs Labs pending Health maintenance reviewed Diet and exercise encouraged Continue all meds Follow up  In 3 months   [place], FNP

## 2013-12-29 LAB — CMP14+EGFR
ALT: 5 IU/L (ref 0–32)
AST: 16 IU/L (ref 0–40)
Albumin/Globulin Ratio: 1.3 (ref 1.1–2.5)
Albumin: 3.3 g/dL — ABNORMAL LOW (ref 3.5–4.8)
Alkaline Phosphatase: 67 IU/L (ref 39–117)
BUN/Creatinine Ratio: 13 (ref 11–26)
BUN: 18 mg/dL (ref 8–27)
CO2: 26 mmol/L (ref 18–29)
CREATININE: 1.35 mg/dL — AB (ref 0.57–1.00)
Calcium: 9.1 mg/dL (ref 8.7–10.3)
Chloride: 102 mmol/L (ref 97–108)
GFR calc Af Amer: 45 mL/min/{1.73_m2} — ABNORMAL LOW (ref >59)
GFR calc non Af Amer: 39 mL/min/{1.73_m2} — ABNORMAL LOW (ref >59)
GLOBULIN, TOTAL: 2.5 g/dL (ref 1.5–4.5)
Glucose: 105 mg/dL — ABNORMAL HIGH (ref 65–99)
Potassium: 4.5 mmol/L (ref 3.5–5.2)
Sodium: 140 mmol/L (ref 134–144)
Total Bilirubin: 0.2 mg/dL (ref 0.0–1.2)
Total Protein: 5.8 g/dL — ABNORMAL LOW (ref 6.0–8.5)

## 2013-12-29 LAB — NMR, LIPOPROFILE
CHOLESTEROL: 169 mg/dL (ref 100–199)
HDL CHOLESTEROL BY NMR: 36 mg/dL — AB (ref >39)
HDL PARTICLE NUMBER: 31.3 umol/L (ref >=30.5)
LDL Particle Number: 986 nmol/L (ref <1000)
LDL Size: 20.5 nm (ref >20.5)
LDLC SERPL CALC-MCNC: 81 mg/dL (ref 0–99)
LP-IR Score: 69 — ABNORMAL HIGH (ref <=45)
Small LDL Particle Number: 555 nmol/L — ABNORMAL HIGH (ref <=527)
Triglycerides by NMR: 260 mg/dL — ABNORMAL HIGH (ref 0–149)

## 2014-01-01 ENCOUNTER — Ambulatory Visit (INDEPENDENT_AMBULATORY_CARE_PROVIDER_SITE_OTHER): Payer: Medicare Other | Admitting: Internal Medicine

## 2014-01-02 ENCOUNTER — Encounter (INDEPENDENT_AMBULATORY_CARE_PROVIDER_SITE_OTHER): Payer: Self-pay | Admitting: Internal Medicine

## 2014-01-02 ENCOUNTER — Ambulatory Visit (INDEPENDENT_AMBULATORY_CARE_PROVIDER_SITE_OTHER): Payer: Medicare Other | Admitting: Internal Medicine

## 2014-01-02 VITALS — BP 122/80 | HR 76 | Temp 97.9°F | Ht 68.5 in | Wt 157.3 lb

## 2014-01-02 DIAGNOSIS — K51219 Ulcerative (chronic) proctitis with unspecified complications: Secondary | ICD-10-CM

## 2014-01-02 MED ORDER — LOPERAMIDE HCL 2 MG PO CAPS: 2.0000 mg | ORAL_CAPSULE | Freq: Three times a day (TID) | ORAL | Status: DC | PRN

## 2014-01-02 NOTE — Patient Instructions (Signed)
OV in 4 months. Imodium BID

## 2014-01-02 NOTE — Progress Notes (Signed)
Subjective:    Patient ID: [name], female    DOB: 10/28/1940, 73 y.o.   MRN: [id number]  HPI Here today for f/u of her UC.  Presently taking Humira once every 14 days. Humira 67m .  Today she states her BMs are better. She tells me she had loose stools yesterday. She has had 4 stool today. She says she cannot go anywhere in mornings because of the loose stools.  Yesterday she had 12 loose stools yesterday. Stools are formed and then progressively loosen.  Last week she had diarrhea. She says her BMs were in small amts. On average she is having 12 stools a day (small amt) for years. She on a rare occasion she has seen blood. Sometimes she has abdominal pain which is not often.  ? Pale rash to lower leg.  Appetite is not good. She has lost 7 pounds since her last visit in July. No melena.  She has joint pain. She says she feels 25/% better since starting the Humira.      CBC    Component Value Date/Time   WBC 8.1 10/05/2013 1652   RBC 4.45 10/05/2013 1652   HGB 12.3 10/05/2013 1652   HCT 37.3 10/05/2013 1652   PLT 455* 10/05/2013 1652   MCV 83.8 10/05/2013 1652   MCH 27.6 10/05/2013 1652   MCHC 33.0 10/05/2013 1652   RDW 15.6* 10/05/2013 1652   LYMPHSABS 1.6 10/05/2013 1652   MONOABS 0.6 10/05/2013 1652   EOSABS 0.2 10/05/2013 1652   BASOSABS 0.1 10/05/2013 1652     CMP     Component Value Date/Time   NA 140 12/28/2013 1106   NA 137 03/15/2013 0523   K 4.5 12/28/2013 1106   CL 102 12/28/2013 1106   CO2 26 12/28/2013 1106   GLUCOSE 105* 12/28/2013 1106   GLUCOSE 153* 03/15/2013 0523   BUN 18 12/28/2013 1106   BUN 15 03/15/2013 0523   CREATININE 1.35* 12/28/2013 1106   CREATININE 1.22* 09/20/2012 1417   CALCIUM 9.1 12/28/2013 1106   PROT 5.8* 12/28/2013 1106   PROT 5.5* 03/11/2013 0459   ALBUMIN 2.8* 03/11/2013 0459   AST 16 12/28/2013 1106   ALT 5 12/28/2013 1106   ALKPHOS 67 12/28/2013 1106   BILITOT 0.2 12/28/2013 1106   GFRNONAA 39* 12/28/2013 1106   GFRNONAA 53* 08/01/2012 1100    GFRAA 45* 12/28/2013 1106   GFRAA 61 08/01/2012 1100     Last colonoscopy in 2012:  FINAL DIAGNOSES:  1. Examination performed to cecum.  2. Active disease with transition at 28 cm from anal margin. Multiple  ulcers involving the rectal mucosa and distal sigmoid colon.  3. Scattered diverticula throughout the colon.  4. Small cecal polyp cold biopsied and residual treated with APC.  5. A 10-mm polyp snared from the rectum.  6. Biopsies taken from mucosa of sigmoid colorectum.  Review of Systems   Review of Systems Past Medical History  Diagnosis Date  . Hypertension   . Ulcerative colitis   . Fibromyalgia   . Ulcerative colitis   . Rectal bleeding   . Diarrhea   . Hyperglycemia, drug-induced 05/02/2011  . Anemia due to blood loss 05/02/2011  . Hypokalemia     Past Surgical History  Procedure Laterality Date  . Cholecystectomy      s/p  . Colonoscopy  06/16/06. 04/27/2007    proctitis seen, biopsies showed chronic active colitis, no dysplasia   . Abdominal hysterectomy  Allergies  Allergen Reactions  . Azathioprine Nausea And Vomiting and Other (See Comments)    SEVERE NAUSEA AND VOMITING WITH CHEST PAIN-  PATIENT INSISTS NEVER TO BE GIVEN ANYTHING SIMILAR TO THIS  . Tape Other (See Comments)    BLISTERS AND SKIN TEARING  . Codeine Nausea And Vomiting  . Penicillins Hives  . Povidone-Iodine Hives    BETADINE  . Procaine Hcl Other (See Comments)    SEVERE GI UPSET (NAUSEA,VOMITING AND DIARRHEA)  . Sulfonamide Derivatives Hives  . Azithromycin     Mycins   . Clonazepam Other (See Comments)    Skin burning, insomnia, diarrhea.  . Dairy Aid [Lactase]   . Latex Rash  . Niacin And Related Rash    Current Outpatient Prescriptions on File Prior to Visit  Medication Sig Dispense Refill  . acetaminophen (TYLENOL) 500 MG tablet Take 500-1,000 mg by mouth every 6 (six) hours as needed. FOR PAIN      . Adalimumab (HUMIRA PEN [place]) Inject into the skin.      Marland Kitchen  ALPRAZolam (XANAX) 0.5 MG tablet Take 1 tablet (0.5 mg total) by mouth 3 (three) times daily as needed for anxiety.  90 tablet  1  . antiseptic oral rinse (BIOTENE) LIQD 1 application by Mouth Rinse route as needed. FOR DRY MOUTH       . B Complex CAPS Take 1 capsule by mouth every morning.       . benazepril-hydrochlorthiazide (LOTENSIN HCT) 20-12.5 MG per tablet TAKE (1) TABLET BY MOUTH ONCE DAILY.  30 tablet  5  . Cholecalciferol (VITAMIN D3) 1000 UNITS CAPS Take 1 capsule by mouth every morning.       Marland Kitchen co-enzyme Q-10 50 MG capsule Take 50 mg by mouth once a week.       . diphenhydrAMINE (BENADRYL) 25 MG tablet Take 25 mg by mouth at bedtime as needed for sleep.      . diphenoxylate-atropine (LOMOTIL) 2.5-0.025 MG per tablet Take 1 tablet by mouth 3 (three) times daily as needed for diarrhea or loose stools.  90 tablet  0  . escitalopram (LEXAPRO) 20 MG tablet Take 1 tablet (20 mg total) by mouth daily.  30 tablet  5  . glucose blood (ONETOUCH VERIO) test strip Test 1 x per day and prn  Dx 250.02  100 each  12  . HYDROcodone-acetaminophen (NORCO/VICODIN) 5-325 MG per tablet TAKE ONE TABLET BY MOUTH EVERY SIX HOURS AS NEEDED FOR PAIN  60 tablet  0  . loperamide (IMODIUM) 2 MG capsule Take 1 capsule (2 mg total) by mouth every 8 (eight) hours as needed for diarrhea or loose stools.  30 capsule  0  . Multiple Vitamins-Minerals (WOMENS MULTI VITAMIN & MINERAL PO) Take 1 tablet by mouth every other day.       . Omega-3 Fatty Acids (FISH OIL) 1000 MG CPDR Take 1 capsule by mouth every evening.       . ondansetron (ZOFRAN) 4 MG tablet Take 4 mg by mouth every 8 (eight) hours as needed for nausea or vomiting.      . Probiotic Product (PROBIOTIC FORMULA PO) Take 1-2 capsules by mouth every morning.       . vitamin A 10000 UNIT capsule Take 10,000 Units by mouth once a week.       . vitamin C (ASCORBIC ACID) 500 MG tablet Take 500 mg by mouth once a week.       . vitamin E 400 UNIT capsule Take  400 Units  by mouth every morning.       . zolpidem (AMBIEN) 10 MG tablet Take 1 tablet (10 mg total) by mouth at bedtime as needed. FOR SLEEP  30 tablet  1  . [DISCONTINUED] sodium chloride (OCEAN) 0.65 % SOLN nasal spray Place 2 sprays into the nose as needed for congestion.       No current facility-administered medications on file prior to visit.        Objective:   Physical Exam There were no vitals filed for this visit.  Filed Vitals:   01/02/14 1555  BP: 122/80  Pulse: 76  Temp: 97.9 F (36.6 C)  Height: 5' 8.5" (1.74 m)  Weight: 157 lb 4.8 oz (71.351 kg)  Alert and oriented. Skin warm and dry. Oral mucosa is moist.   . Sclera anicteric, conjunctivae is pink. Thyroid not enlarged. No cervical lymphadenopathy. Lungs clear. Heart regular rate and rhythm.  Abdomen is soft. Bowel sounds are positive. No hepatomegaly. No abdominal masses felt. No tenderness.  No edema to lower extremities. No rash to lower extremities          Assessment & Plan:  UC. Presently taking Humira every 14 days. She says she feels 25% better. CBC, CRP today. OV 4 months with DR. Rehman.

## 2014-01-03 LAB — CBC WITH DIFFERENTIAL/PLATELET
Basophils Absolute: 0.1 10*3/uL (ref 0.0–0.1)
Basophils Relative: 1 % (ref 0–1)
Eosinophils Absolute: 0.1 10*3/uL (ref 0.0–0.7)
Eosinophils Relative: 1 % (ref 0–5)
HEMATOCRIT: 35.8 % — AB (ref 36.0–46.0)
Hemoglobin: 11.6 g/dL — ABNORMAL LOW (ref 12.0–15.0)
LYMPHS PCT: 18 % (ref 12–46)
Lymphs Abs: 1.9 10*3/uL (ref 0.7–4.0)
MCH: 27.2 pg (ref 26.0–34.0)
MCHC: 32.4 g/dL (ref 30.0–36.0)
MCV: 83.8 fL (ref 78.0–100.0)
MONO ABS: 0.5 10*3/uL (ref 0.1–1.0)
MONOS PCT: 5 % (ref 3–12)
NEUTROS ABS: 7.7 10*3/uL (ref 1.7–7.7)
Neutrophils Relative %: 75 % (ref 43–77)
Platelets: 550 10*3/uL — ABNORMAL HIGH (ref 150–400)
RBC: 4.27 MIL/uL (ref 3.87–5.11)
RDW: 16.1 % — AB (ref 11.5–15.5)
WBC: 10.3 10*3/uL (ref 4.0–10.5)

## 2014-01-03 LAB — C-REACTIVE PROTEIN: CRP: 0.9 mg/dL — ABNORMAL HIGH (ref <0.60)

## 2014-01-16 ENCOUNTER — Telehealth: Payer: Self-pay | Admitting: Nurse Practitioner

## 2014-01-16 DIAGNOSIS — K5150 Left sided colitis without complications: Secondary | ICD-10-CM

## 2014-01-16 DIAGNOSIS — Z13820 Encounter for screening for osteoporosis: Secondary | ICD-10-CM

## 2014-01-16 MED ORDER — HYDROCODONE-ACETAMINOPHEN 5-325 MG PO TABS: ORAL_TABLET | ORAL | Status: DC

## 2014-01-16 NOTE — Telephone Encounter (Signed)
rx ready for pick up

## 2014-01-16 NOTE — Telephone Encounter (Signed)
Needs some more hydrocodone she takes 2 and 1/2 a day will be down her tomorrow at 3 for flu shot

## 2014-01-17 ENCOUNTER — Ambulatory Visit (INDEPENDENT_AMBULATORY_CARE_PROVIDER_SITE_OTHER): Payer: Medicare Other

## 2014-01-17 DIAGNOSIS — Z23 Encounter for immunization: Secondary | ICD-10-CM

## 2014-02-12 ENCOUNTER — Other Ambulatory Visit (INDEPENDENT_AMBULATORY_CARE_PROVIDER_SITE_OTHER): Payer: Self-pay | Admitting: Internal Medicine

## 2014-02-19 ENCOUNTER — Other Ambulatory Visit: Payer: Self-pay | Admitting: Nurse Practitioner

## 2014-02-19 DIAGNOSIS — K5150 Left sided colitis without complications: Secondary | ICD-10-CM

## 2014-02-19 MED ORDER — HYDROCODONE-ACETAMINOPHEN 5-325 MG PO TABS: ORAL_TABLET | ORAL | Status: DC

## 2014-02-19 NOTE — Telephone Encounter (Signed)
Hydrocodone rx ready for pick up

## 2014-02-19 NOTE — Telephone Encounter (Signed)
LM, script ready of hydrocodone.

## 2014-02-19 NOTE — Telephone Encounter (Signed)
Last seen on 12-28-13 with last refill done on 01-16-14 qty 90 ,take every 8 hours.

## 2014-03-01 ENCOUNTER — Other Ambulatory Visit: Payer: Self-pay | Admitting: Nurse Practitioner

## 2014-03-01 DIAGNOSIS — F32A Depression, unspecified: Secondary | ICD-10-CM

## 2014-03-01 DIAGNOSIS — F329 Major depressive disorder, single episode, unspecified: Secondary | ICD-10-CM

## 2014-03-02 MED ORDER — ALPRAZOLAM 0.5 MG PO TABS: 0.5000 mg | ORAL_TABLET | Freq: Three times a day (TID) | ORAL | Status: DC | PRN

## 2014-03-02 NOTE — Telephone Encounter (Signed)
rx called in, pt aware.

## 2014-03-02 NOTE — Telephone Encounter (Signed)
Please call in xanax 0.5 1 po TID prn #90 with 1 refills

## 2014-03-02 NOTE — Telephone Encounter (Signed)
Stp advised rx called into [facility].Will close call.

## 2014-03-19 ENCOUNTER — Other Ambulatory Visit: Payer: Self-pay | Admitting: Nurse Practitioner

## 2014-03-20 ENCOUNTER — Telehealth: Payer: Self-pay | Admitting: Nurse Practitioner

## 2014-03-20 DIAGNOSIS — G4700 Insomnia, unspecified: Secondary | ICD-10-CM

## 2014-03-20 DIAGNOSIS — K5150 Left sided colitis without complications: Secondary | ICD-10-CM

## 2014-03-20 MED ORDER — HYDROCODONE-ACETAMINOPHEN 5-325 MG PO TABS: ORAL_TABLET | ORAL | Status: DC

## 2014-03-20 MED ORDER — ZOLPIDEM TARTRATE 10 MG PO TABS: 10.0000 mg | ORAL_TABLET | Freq: Every evening | ORAL | Status: DC | PRN

## 2014-03-20 NOTE — Telephone Encounter (Signed)
Pt aware.

## 2014-03-20 NOTE — Telephone Encounter (Signed)
rx ready for pick up

## 2014-04-02 ENCOUNTER — Other Ambulatory Visit: Payer: Self-pay | Admitting: Nurse Practitioner

## 2014-04-02 ENCOUNTER — Ambulatory Visit: Payer: Medicare Other | Admitting: Nurse Practitioner

## 2014-04-03 NOTE — Telephone Encounter (Signed)
Last seen 12/28/13, last filled 03/02/14. Call into [facility] [phone]

## 2014-04-03 NOTE — Telephone Encounter (Signed)
Please call in xanax with 0 refills 

## 2014-04-04 ENCOUNTER — Other Ambulatory Visit: Payer: Medicare Other

## 2014-04-04 ENCOUNTER — Ambulatory Visit: Payer: Medicare Other

## 2014-04-04 NOTE — Telephone Encounter (Signed)
rx called into pharmacy

## 2014-04-17 ENCOUNTER — Other Ambulatory Visit: Payer: Self-pay | Admitting: Nurse Practitioner

## 2014-04-17 DIAGNOSIS — K5150 Left sided colitis without complications: Secondary | ICD-10-CM

## 2014-04-19 MED ORDER — HYDROCODONE-ACETAMINOPHEN 5-325 MG PO TABS: ORAL_TABLET | ORAL | Status: DC

## 2014-04-19 NOTE — Telephone Encounter (Signed)
Pt notified RX is ready for pick up

## 2014-04-19 NOTE — Telephone Encounter (Signed)
Pain rx ready for pick up

## 2014-05-02 ENCOUNTER — Other Ambulatory Visit: Payer: Self-pay | Admitting: Nurse Practitioner

## 2014-05-02 NOTE — Telephone Encounter (Signed)
Last seen 12/28/13 MMM  If approved route to nurse to call into [facility]  [phone]

## 2014-05-03 ENCOUNTER — Other Ambulatory Visit: Payer: Self-pay | Admitting: *Deleted

## 2014-05-03 NOTE — Telephone Encounter (Signed)
Xanax refills called to pharmacy.

## 2014-05-03 NOTE — Telephone Encounter (Signed)
Please call in xanax with 1 refills 

## 2014-05-07 ENCOUNTER — Ambulatory Visit (INDEPENDENT_AMBULATORY_CARE_PROVIDER_SITE_OTHER): Payer: Medicare Other | Admitting: Internal Medicine

## 2014-05-22 ENCOUNTER — Telehealth: Payer: Self-pay | Admitting: Nurse Practitioner

## 2014-05-22 DIAGNOSIS — K5150 Left sided colitis without complications: Secondary | ICD-10-CM

## 2014-05-22 MED ORDER — HYDROCODONE-ACETAMINOPHEN 5-325 MG PO TABS: ORAL_TABLET | ORAL | Status: DC

## 2014-05-22 NOTE — Telephone Encounter (Signed)
Patient notified that rx up front and ready for pick up

## 2014-05-22 NOTE — Telephone Encounter (Signed)
Hydrocodone rx ready for pick up

## 2014-05-28 ENCOUNTER — Ambulatory Visit (INDEPENDENT_AMBULATORY_CARE_PROVIDER_SITE_OTHER): Payer: Self-pay | Admitting: Internal Medicine

## 2014-06-05 ENCOUNTER — Encounter (INDEPENDENT_AMBULATORY_CARE_PROVIDER_SITE_OTHER): Payer: Self-pay | Admitting: Internal Medicine

## 2014-06-05 ENCOUNTER — Ambulatory Visit (INDEPENDENT_AMBULATORY_CARE_PROVIDER_SITE_OTHER): Payer: Medicare Other | Admitting: Internal Medicine

## 2014-06-05 VITALS — BP 118/84 | HR 74 | Temp 98.0°F | Resp 18 | Ht 68.5 in | Wt 149.4 lb

## 2014-06-05 DIAGNOSIS — K51918 Ulcerative colitis, unspecified with other complication: Secondary | ICD-10-CM

## 2014-06-05 DIAGNOSIS — R1030 Lower abdominal pain, unspecified: Secondary | ICD-10-CM

## 2014-06-05 MED ORDER — OXYCODONE HCL 5 MG PO TABS: 5.0000 mg | ORAL_TABLET | Freq: Three times a day (TID) | ORAL | Status: DC | PRN

## 2014-06-05 MED ORDER — MESALAMINE 1000 MG RE SUPP: 1000.0000 mg | Freq: Every day | RECTAL | Status: DC

## 2014-06-05 MED ORDER — HYDROCORTISONE 2.5 % RE CREA: 1.0000 "application " | TOPICAL_CREAM | Freq: Two times a day (BID) | RECTAL | Status: DC

## 2014-06-05 NOTE — Patient Instructions (Signed)
Physician will call with results of blood work when completed. Take Imodium 2 mg by mouth daily before breakfast and subsequent doses on as-needed basis.

## 2014-06-05 NOTE — Progress Notes (Signed)
Presenting complaint;  Follow-up ulcerative colitis and abdominal pain.  Subjective:  Patient is 74 year old Caucasian female who presents for scheduled visit. She was last seen on 01/02/2014. She has lost 8 pounds since her last visit. She states she has good appetite. She remains with diarrhea. She has anywhere from 3-8 stools per day. She also has nocturnal bowel movement. Most of her stools are watery to some off on. She has noted blood every now and then with her bowel movements. He continues to complain of pain in her lower abdomen primarily on the left side and is getting relief with hydrocodone but she believes she is getting a rash from this medication. Overall she feels 50% better. She is taking Lomotil and on Imodium on days when she has to leave house. She also complains of flatulence. She is still having 4-5 accidents per month.    Current Medications: Outpatient Encounter Prescriptions as of 06/05/2014  Medication Sig  . acetaminophen (TYLENOL) 500 MG tablet Take 500-1,000 mg by mouth every 6 (six) hours as needed. FOR PAIN  . Adalimumab (HUMIRA PEN [place]) Inject 40 mg into the skin. Every 2 weeks  . ALPRAZolam (XANAX) 0.25 MG tablet TAKE (1) TABLET BY MOUTH THREE TIMES DAILY AS NEEDED.  Marland Kitchen antiseptic oral rinse (BIOTENE) LIQD 1 application by Mouth Rinse route as needed. FOR DRY MOUTH   . B Complex CAPS Take 1 capsule by mouth every morning.   . benazepril-hydrochlorthiazide (LOTENSIN HCT) 20-12.5 MG per tablet TAKE 1 TABLET ONCE DAILY.  Marland Kitchen Cholecalciferol (VITAMIN D3) 1000 UNITS CAPS Take 1 capsule by mouth. Patient states that she alternates with Hair, Skin, and Nails.  Marland Kitchen co-enzyme Q-10 50 MG capsule Take 50 mg by mouth once a week.   . diphenhydrAMINE (BENADRYL) 25 MG tablet Take 25 mg by mouth at bedtime as needed for sleep.  . diphenoxylate-atropine (LOMOTIL) 2.5-0.025 MG per tablet Take 1 tablet by mouth 3 (three) times daily as needed for diarrhea or loose stools.  Marland Kitchen glucose  blood (ONETOUCH VERIO) test strip Test 1 x per day and prn  Dx 250.02  . HYDROcodone-acetaminophen (NORCO/VICODIN) 5-325 MG per tablet TAKE ONE TABLET BY MOUTH EVERY eight HOURS AS NEEDED FOR PAIN  . loperamide (IMODIUM) 2 MG capsule Take 1 capsule (2 mg total) by mouth every 8 (eight) hours as needed for diarrhea or loose stools.  . Multiple Vitamins-Minerals (WOMENS MULTI VITAMIN & MINERAL PO) Take 1 tablet by mouth every other day.   . Omega-3 Fatty Acids (FISH OIL) 1000 MG CPDR Take 1 capsule by mouth every evening.   . ondansetron (ZOFRAN) 4 MG tablet Take 4 mg by mouth every 8 (eight) hours as needed for nausea or vomiting.  . Probiotic Product (PROBIOTIC FORMULA PO) Take 1-2 capsules by mouth every morning.   . vitamin A 10000 UNIT capsule Take 10,000 Units by mouth once a week.   . vitamin C (ASCORBIC ACID) 500 MG tablet Take 500 mg by mouth once a week.   . vitamin E 400 UNIT capsule Take 400 Units by mouth every morning.   . zolpidem (AMBIEN) 10 MG tablet Take 1 tablet (10 mg total) by mouth at bedtime as needed. FOR SLEEP  . [DISCONTINUED] ALPRAZolam (XANAX) 0.5 MG tablet Take 1 tablet (0.5 mg total) by mouth 3 (three) times daily as needed for anxiety. (Patient not taking: Reported on 06/05/2014)  . [DISCONTINUED] escitalopram (LEXAPRO) 20 MG tablet TAKE 1 TABLET ONCE DAILY. (Patient not taking: Reported on 06/05/2014)  . [  DISCONTINUED] ondansetron (ZOFRAN) 8 MG tablet Take 1 tablet (8 mg total) by mouth 3 (three) times daily as needed for nausea or vomiting. (Patient not taking: Reported on 06/05/2014)     Objective: Blood pressure 118/84, pulse 74, temperature 98 F (36.7 C), temperature source Oral, resp. rate 18, height 5' 8.5" (1.74 m), weight 149 lb 6.4 oz (67.767 kg). Patient is alert and in no acute distress. Conjunctiva is pink. Sclera is nonicteric Oropharyngeal mucosa is normal. No neck masses or thyromegaly noted. Cardiac exam with regular rhythm normal S1 and S2. No  murmur or gallop noted. Lungs are clear to auscultation. Abdomen is symmetrical. Bowel sounds are normal. On palpation abdomen is softened mild tenderness across lower abdomen without guarding or rebound. No organomegaly or masses. No LE edema or clubbing noted.  Labs/studies Results: Lab data from 01/02/2014  H&H 11.6 and 35.8 and platelet count 550K   CRP below 0.9(normal less than 0.5).  Assessment:  #1. Chronic ulcerative colitis which has been difficult to treat because of intolerance of side effects to medications. She has been on Humira since May 2015 and appears to be tolerating this medication. She is better but still not in remission. #2. Chronic lower abdominal pain for which she is using hydrocodone on when necessary basis but she may be having side effects in the form of rash. #3. IBS. I believe she also has IBS in addition to UC.   Plan:  DC hydrocodone/acetaminophen. Oxycodone 5 mg by mouth twice a day when necessary. [place] 1 g PR daily at bedtime. She has prescription at home. Take Imodium 2 mg by mouth every morning and subsequent doses on as-needed basis. Hydrocortisone 2.5% rectal cream to be used on when necessary basis or twice daily. CBC with differential and CRP. Office visit in 3 months.

## 2014-06-08 ENCOUNTER — Encounter: Payer: Self-pay | Admitting: Nurse Practitioner

## 2014-06-08 ENCOUNTER — Ambulatory Visit (INDEPENDENT_AMBULATORY_CARE_PROVIDER_SITE_OTHER): Payer: Medicare Other | Admitting: Nurse Practitioner

## 2014-06-08 VITALS — BP 116/70 | HR 90 | Temp 97.0°F | Ht 68.0 in | Wt 150.0 lb

## 2014-06-08 DIAGNOSIS — K51919 Ulcerative colitis, unspecified with unspecified complications: Secondary | ICD-10-CM | POA: Diagnosis not present

## 2014-06-08 DIAGNOSIS — E1110 Type 2 diabetes mellitus with ketoacidosis without coma: Secondary | ICD-10-CM

## 2014-06-08 DIAGNOSIS — E1310 Other specified diabetes mellitus with ketoacidosis without coma: Secondary | ICD-10-CM

## 2014-06-08 DIAGNOSIS — Z13820 Encounter for screening for osteoporosis: Secondary | ICD-10-CM

## 2014-06-08 DIAGNOSIS — G4700 Insomnia, unspecified: Secondary | ICD-10-CM | POA: Diagnosis not present

## 2014-06-08 DIAGNOSIS — I10 Essential (primary) hypertension: Secondary | ICD-10-CM | POA: Diagnosis not present

## 2014-06-08 LAB — POCT GLYCOSYLATED HEMOGLOBIN (HGB A1C): Hemoglobin A1C: 6.4

## 2014-06-08 LAB — POCT UA - MICROALBUMIN: Microalbumin Ur, POC: 20 mg/L

## 2014-06-08 MED ORDER — ALPRAZOLAM 0.5 MG PO TABS: 0.5000 mg | ORAL_TABLET | Freq: Two times a day (BID) | ORAL | Status: DC | PRN

## 2014-06-08 MED ORDER — BENAZEPRIL-HYDROCHLOROTHIAZIDE 20-12.5 MG PO TABS: 1.0000 | ORAL_TABLET | Freq: Every day | ORAL | Status: DC

## 2014-06-08 NOTE — Progress Notes (Signed)
Subjective:    Patient ID: [name], female    DOB: Feb 25, 1941, 74 y.o.   MRN: [id number]  Patient here today for follow up of chronic medical problems. No complaints today  Hypertension This is a chronic problem. The current episode started more than 1 year ago. The problem is unchanged. The problem is controlled. Pertinent negatives include no chest pain, headaches, neck pain, palpitations or shortness of breath. Risk factors for coronary artery disease include dyslipidemia, post-menopausal state and sedentary lifestyle. Past treatments include ACE inhibitors and diuretics. The current treatment provides moderate improvement. Compliance problems include diet and exercise.  There is no history of CAD/MI, CVA, heart failure or PVD.  Hyperlipidemia This is a chronic problem. The current episode started more than 1 year ago. The problem is controlled. She has no history of diabetes, hypothyroidism or obesity. Pertinent negatives include no chest pain or shortness of breath. She is currently on no antihyperlipidemic treatment. The current treatment provides moderate improvement of lipids. Compliance problems include adherence to diet and adherence to exercise.  Risk factors for coronary artery disease include dyslipidemia, hypertension and post-menopausal.  Diabetes She presents for her follow-up diabetic visit. She has type 2 diabetes mellitus. Pertinent negatives for hypoglycemia include no headaches. Pertinent negatives for diabetes include no chest pain. Symptoms are stable. Pertinent negatives for diabetic complications include no CVA or PVD. Her weight is stable. When asked about meal planning, she reported none. She has not had a previous visit with a dietitian. She rarely participates in exercise. Home blood sugar record trend: only checks occassionally. Her breakfast blood glucose range is generally 130-140 mg/dl. Her overall blood glucose range is 130-140 mg/dl. An ACE inhibitor/angiotensin  II receptor blocker is being taken. She does not see a podiatrist.Eye exam is not current.  ulcerative colitis Dr. [name]- curremtly coming off of  prednisone- they have  started her on humiria- should be starting n the next couple of weeks. Drepression/GAD lexapor- working well to keep her from worrying so much. Takes xanax as needed at least BID- insurance no longer wants to pay for xanax so need to change. insomnia ambien but only takes occasionally- uses benadryl most of the time.   Review of Systems  HENT: Negative.   Respiratory: Negative for shortness of breath.   Cardiovascular: Negative for chest pain and palpitations.  Gastrointestinal: Positive for nausea, diarrhea and constipation.  Musculoskeletal: Negative for neck pain.  Neurological: Negative for headaches.  Psychiatric/Behavioral: Negative.   All other systems reviewed and are negative.      Objective:   Physical Exam  Constitutional: She is oriented to person, place, and time. She appears well-developed and well-nourished.  HENT:  Nose: Nose normal.  Mouth/Throat: Oropharynx is clear and moist.  Eyes: EOM are normal.  Neck: Trachea normal, normal range of motion and full passive range of motion without pain. Neck supple. No JVD present. Carotid bruit is not present. No thyromegaly present.  Cardiovascular: Normal rate, regular rhythm, normal heart sounds and intact distal pulses.  Exam reveals no gallop and no friction rub.   No murmur heard. Pulmonary/Chest: Effort normal and breath sounds normal.  Abdominal: Soft. Bowel sounds are normal. She exhibits no distension and no mass. There is tenderness (diffuse).  Musculoskeletal: Normal range of motion.  Lymphadenopathy:    She has no cervical adenopathy.  Neurological: She is alert and oriented to person, place, and time. She has normal reflexes.  Skin: Skin is warm and dry.  Psychiatric: She has  a normal mood and affect. Her behavior is normal. Judgment and  thought content normal.   BP 116/70 mmHg  Pulse 90  Temp(Src) 97 F (36.1 C) (Oral)  Ht 5' 8"  (1.727 m)  Wt 150 lb (68.04 kg)  BMI 22.81 kg/m2  Results for orders placed or performed in visit on 06/08/14  POCT glycosylated hemoglobin (Hb A1C)  Result Value Ref Range   Hemoglobin A1C 6.4   POCT UA - Microalbumin  Result Value Ref Range   Microalbumin Ur, POC 20 mg/L         Assessment & Plan:   1. DM (diabetes mellitus) type 2, uncontrolled, with ketoacidosis Continue t0 watch carbs in diet - POCT glycosylated hemoglobin (Hb A1C) - POCT UA - Microalbumin  2. Essential hypertension Do not add salt to diet - CMP14+EGFR - NMR, lipoprofile - benazepril-hydrochlorthiazide (LOTENSIN HCT) 20-12.5 MG per tablet; Take 1 tablet by mouth daily.  Dispense: 30 tablet; Refill: 5  3. UC (ulcerative colitis), unspecified complication Keep follow up with Dr. [name] - C-reactive protein - CBC with Differential/Platelet  4. Insomnia Bedtime ritual - ALPRAZolam (XANAX) 0.5 MG tablet; Take 1 tablet (0.5 mg total) by mouth 2 (two) times daily as needed for anxiety.  Dispense: 60 tablet; Refill: 1  5. Screening for osteoporosis dexa scan - DG Bone Density; Future    Labs pending Health maintenance reviewed Diet and exercise encouraged Continue all meds Follow up  In 3 month   [place], FNP

## 2014-06-08 NOTE — Patient Instructions (Signed)
Bone Health Our bones do many things. They provide structure, protect organs, anchor muscles, and store calcium. Adequate calcium in your diet and weight-bearing physical activity help build strong bones, improve bone amounts, and may reduce the risk of weakening of bones (osteoporosis) later in life. PEAK BONE MASS By age 73, the average woman has acquired most of her skeletal bone mass. A large decline occurs in older adults which increases the risk of osteoporosis. In women this occurs around the time of menopause. It is important for young girls to reach their peak bone mass in order to maintain bone health throughout life. A person with high bone mass as a young adult will be more likely to have a higher bone mass later in life. Not enough calcium consumption and physical activity early on could result in a failure to achieve optimum bone mass in adulthood. OSTEOPOROSIS Osteoporosis is a disease of the bones. It is defined as low bone mass with deterioration of bone structure. Osteoporosis leads to an increase risk of fractures with falls. These fractures commonly happen in the wrist, hip, and spine. While men and women of all ages and background can develop osteoporosis, some of the risk factors for osteoporosis are:  Female.  White.  Postmenopausal.  Older adults.  Small in body size.  Eating a diet low in calcium.  Physically inactive.  Smoking.  Use of some medications.  Family history. CALCIUM Calcium is a mineral needed by the body for healthy bones, teeth, and proper function of the heart, muscles, and nerves. The body cannot produce calcium so it must be absorbed through food. Good sources of calcium include:  Dairy products (low fat or nonfat milk, cheese, and yogurt).  Dark green leafy vegetables (bok choy and broccoli).  Calcium fortified foods (orange juice, cereal, bread, soy beverages, and tofu products).  Nuts (almonds). Recommended amounts of calcium vary  for individuals. RECOMMENDED CALCIUM INTAKES Age and Amount in mg per day  Children 1 to 3 years / 700 mg  Children 4 to 8 years / 1,000 mg  Children 9 to 13 years / 1,300 mg  Teens 14 to 18 years / 1,300 mg  Adults 19 to 50 years / 1,000 mg  Adult women 51 to 70 years / 1,200 mg  Adults 71 years and older / 1,200 mg  Pregnant and breastfeeding teens / 1,300 mg  Pregnant and breastfeeding adults / 1,000 mg Vitamin D also plays an important role in healthy bone development. Vitamin D helps in the absorption of calcium. WEIGHT-BEARING PHYSICAL ACTIVITY Regular physical activity has many positive health benefits. Benefits include strong bones. Weight-bearing physical activity early in life is important in reaching peak bone mass. Weight-bearing physical activities cause muscles and bones to work against gravity. Some examples of weight bearing physical activities include:  Walking, jogging, or running.  Field Hockey.  Jumping rope.  Dancing.  Soccer.  Tennis or Racquetball.  Stair climbing.  Basketball.  Hiking.  Weight lifting.  Aerobic fitness classes. Including weight-bearing physical activity into an exercise plan is a great way to keep bones healthy. Adults: Engage in at least 30 minutes of moderate physical activity on most, preferably all, days of the week. Children: Engage in at least 60 minutes of moderate physical activity on most, preferably all, days of the week. FOR MORE INFORMATION [place] Department of Agriculture, Center for Nutrition Policy and Promotion: www.cnpp.usda.gov National Osteoporosis Foundation: www.nof.org Document Released: 05/30/2003 Document Revised: 07/04/2012 Document Reviewed: 08/29/2008 ExitCare Patient Information   2015 [employer]. This information is not intended to replace advice given to you by your health care provider. Make sure you discuss any questions you have with your health care provider.  

## 2014-06-09 LAB — CBC WITH DIFFERENTIAL/PLATELET
BASOS: 1 %
Basophils Absolute: 0.1 10*3/uL (ref 0.0–0.2)
EOS ABS: 0.1 10*3/uL (ref 0.0–0.4)
Eos: 2 %
HCT: 37.5 % (ref 34.0–46.6)
Hemoglobin: 12.1 g/dL (ref 11.1–15.9)
Immature Grans (Abs): 0 10*3/uL (ref 0.0–0.1)
Immature Granulocytes: 0 %
Lymphocytes Absolute: 2.4 10*3/uL (ref 0.7–3.1)
Lymphs: 35 %
MCH: 27.3 pg (ref 26.6–33.0)
MCHC: 32.3 g/dL (ref 31.5–35.7)
MCV: 85 fL (ref 79–97)
MONOS ABS: 0.4 10*3/uL (ref 0.1–0.9)
Monocytes: 6 %
NEUTROS PCT: 56 %
Neutrophils Absolute: 3.9 10*3/uL (ref 1.4–7.0)
PLATELETS: 445 10*3/uL — AB (ref 150–379)
RBC: 4.44 x10E6/uL (ref 3.77–5.28)
RDW: 15.6 % — AB (ref 12.3–15.4)
WBC: 7 10*3/uL (ref 3.4–10.8)

## 2014-06-09 LAB — NMR, LIPOPROFILE
Cholesterol: 199 mg/dL (ref 100–199)
HDL Cholesterol by NMR: 44 mg/dL (ref >39)
HDL Particle Number: 34.5 umol/L (ref >=30.5)
LDL PARTICLE NUMBER: 1263 nmol/L — AB (ref <1000)
LDL Size: 20.4 nm (ref >20.5)
LDL-C: 96 mg/dL (ref 0–99)
LP-IR SCORE: 74 — AB (ref <=45)
Small LDL Particle Number: 628 nmol/L — ABNORMAL HIGH (ref <=527)
Triglycerides by NMR: 294 mg/dL — ABNORMAL HIGH (ref 0–149)

## 2014-06-09 LAB — CMP14+EGFR
ALT: 11 IU/L (ref 0–32)
AST: 24 IU/L (ref 0–40)
Albumin/Globulin Ratio: 1.4 (ref 1.1–2.5)
Albumin: 4.3 g/dL (ref 3.5–4.8)
Alkaline Phosphatase: 95 IU/L (ref 39–117)
BILIRUBIN TOTAL: 0.5 mg/dL (ref 0.0–1.2)
BUN/Creatinine Ratio: 18 (ref 11–26)
BUN: 22 mg/dL (ref 8–27)
CHLORIDE: 99 mmol/L (ref 97–108)
CO2: 22 mmol/L (ref 18–29)
CREATININE: 1.24 mg/dL — AB (ref 0.57–1.00)
Calcium: 9.7 mg/dL (ref 8.7–10.3)
GFR calc Af Amer: 50 mL/min/{1.73_m2} — ABNORMAL LOW (ref >59)
GFR, EST NON AFRICAN AMERICAN: 43 mL/min/{1.73_m2} — AB (ref >59)
GLOBULIN, TOTAL: 3 g/dL (ref 1.5–4.5)
Glucose: 118 mg/dL — ABNORMAL HIGH (ref 65–99)
POTASSIUM: 4.3 mmol/L (ref 3.5–5.2)
Sodium: 137 mmol/L (ref 134–144)
Total Protein: 7.3 g/dL (ref 6.0–8.5)

## 2014-06-09 LAB — C-REACTIVE PROTEIN: CRP: 4.9 mg/L (ref 0.0–4.9)

## 2014-06-26 ENCOUNTER — Telehealth: Payer: Self-pay | Admitting: Nurse Practitioner

## 2014-06-26 MED ORDER — ZOLPIDEM TARTRATE 5 MG PO TABS: 5.0000 mg | ORAL_TABLET | Freq: Every evening | ORAL | Status: DC | PRN

## 2014-06-26 NOTE — Telephone Encounter (Signed)
i do  Not see Lorrin Mais on her list of meds- when was last time she had it.

## 2014-06-26 NOTE — Telephone Encounter (Signed)
Last refill on Ambien was in Feb. She had mentioned at her March visit that it made her feel funny in the mornings but she is able to get up in the mornings and only takes it when she needs to. Should she stay on the Ambien or is there something else?

## 2014-06-26 NOTE — Telephone Encounter (Signed)
Please call in ambien 5 mg 1 po qhs prn #[phone]  with 1 refills Decreased to 47m to see if has less grogginess the next day.

## 2014-06-26 NOTE — Telephone Encounter (Signed)
lmovm that refill called to [facility]

## 2014-07-04 ENCOUNTER — Telehealth: Payer: Self-pay

## 2014-07-04 NOTE — Telephone Encounter (Signed)
[facility] to x-ray

## 2014-07-06 NOTE — Telephone Encounter (Signed)
Pt aware  of appointment date/time

## 2014-07-09 ENCOUNTER — Ambulatory Visit (INDEPENDENT_AMBULATORY_CARE_PROVIDER_SITE_OTHER): Payer: Medicare Other

## 2014-07-09 ENCOUNTER — Other Ambulatory Visit: Payer: Self-pay | Admitting: Nurse Practitioner

## 2014-07-09 DIAGNOSIS — Z780 Asymptomatic menopausal state: Secondary | ICD-10-CM

## 2014-07-09 DIAGNOSIS — Z13820 Encounter for screening for osteoporosis: Secondary | ICD-10-CM

## 2014-07-23 ENCOUNTER — Telehealth (INDEPENDENT_AMBULATORY_CARE_PROVIDER_SITE_OTHER): Payer: Self-pay | Admitting: *Deleted

## 2014-07-23 NOTE — Telephone Encounter (Signed)
Vaughan Basta called [name] for a Rx refill and was told they didn't have Dr. Olevia Perches ID information that this could not be refilled. Kaylon would like for [name] to return her call at [phone]. She only has one left. Patient didn't leave name of medicine.

## 2014-07-25 ENCOUNTER — Other Ambulatory Visit: Payer: Self-pay | Admitting: Nurse Practitioner

## 2014-07-25 NOTE — Telephone Encounter (Signed)
Patient call made. Message that left as follows - I have called [facility] - they just needed some information about Dr.Rehman. They will process your prescription tomorrow , and should call you. If you have not heard from them by tomorrow afternoon, they ask that you call them.

## 2014-07-25 NOTE — Telephone Encounter (Signed)
[name] called back and LM asking for [name] to please return her call. [name] was needing additional information before they could refill her Rx.

## 2014-07-26 ENCOUNTER — Other Ambulatory Visit: Payer: Self-pay

## 2014-07-26 DIAGNOSIS — G4700 Insomnia, unspecified: Secondary | ICD-10-CM

## 2014-07-26 MED ORDER — ALPRAZOLAM 0.5 MG PO TABS: 0.5000 mg | ORAL_TABLET | Freq: Two times a day (BID) | ORAL | Status: DC | PRN

## 2014-07-26 NOTE — Telephone Encounter (Signed)
Refill called to [facility]

## 2014-07-26 NOTE — Telephone Encounter (Signed)
Please call in xanax with 1 refills 

## 2014-07-26 NOTE — Telephone Encounter (Signed)
Last seen 06/08/14 MMM if approved route to nurse to call into [place]  [phone]

## 2014-08-24 ENCOUNTER — Other Ambulatory Visit (INDEPENDENT_AMBULATORY_CARE_PROVIDER_SITE_OTHER): Payer: Self-pay | Admitting: Internal Medicine

## 2014-08-24 ENCOUNTER — Other Ambulatory Visit: Payer: Self-pay | Admitting: Nurse Practitioner

## 2014-08-24 NOTE — Telephone Encounter (Signed)
Please call in xanax with 1 refills 

## 2014-08-24 NOTE — Telephone Encounter (Signed)
Last filled 07/26/14, last seen 06/08/14. Call into [facility]

## 2014-08-27 ENCOUNTER — Other Ambulatory Visit: Payer: Self-pay | Admitting: *Deleted

## 2014-08-27 MED ORDER — ZOLPIDEM TARTRATE 5 MG PO TABS: 5.0000 mg | ORAL_TABLET | Freq: Every evening | ORAL | Status: DC | PRN

## 2014-08-27 NOTE — Telephone Encounter (Signed)
MMM pt, last seen 06/08/14, last filled 07/25/14. Route to pool, nurse call into [facility] [facility]

## 2014-08-27 NOTE — Telephone Encounter (Signed)
Called to [facility].

## 2014-09-14 ENCOUNTER — Other Ambulatory Visit: Payer: Self-pay

## 2014-09-14 ENCOUNTER — Telehealth: Payer: Self-pay | Admitting: Nurse Practitioner

## 2014-09-14 DIAGNOSIS — R1030 Lower abdominal pain, unspecified: Secondary | ICD-10-CM

## 2014-09-14 MED ORDER — OXYCODONE HCL 5 MG PO TABS: 5.0000 mg | ORAL_TABLET | Freq: Three times a day (TID) | ORAL | Status: DC | PRN

## 2014-09-14 NOTE — Telephone Encounter (Signed)
Last seen 06/08/14 MMM If approved print

## 2014-09-15 ENCOUNTER — Ambulatory Visit (INDEPENDENT_AMBULATORY_CARE_PROVIDER_SITE_OTHER): Payer: Medicare Other | Admitting: Family Medicine

## 2014-09-15 ENCOUNTER — Telehealth: Payer: Self-pay | Admitting: Internal Medicine

## 2014-09-15 VITALS — BP 130/77 | HR 75 | Temp 96.9°F | Ht 68.0 in | Wt 151.0 lb

## 2014-09-15 DIAGNOSIS — M79642 Pain in left hand: Secondary | ICD-10-CM

## 2014-09-15 DIAGNOSIS — M542 Cervicalgia: Secondary | ICD-10-CM

## 2014-09-15 DIAGNOSIS — M79602 Pain in left arm: Secondary | ICD-10-CM | POA: Diagnosis not present

## 2014-09-15 LAB — POCT CBC
Granulocyte percent: 68 %G (ref 37–80)
HEMATOCRIT: 38.8 % (ref 37.7–47.9)
Hemoglobin: 11.8 g/dL — AB (ref 12.2–16.2)
LYMPH, POC: 1.6 (ref 0.6–3.4)
MCH: 26.4 pg — AB (ref 27–31.2)
MCHC: 30.3 g/dL — AB (ref 31.8–35.4)
MCV: 87 fL (ref 80–97)
MPV: 7.9 fL (ref 0–99.8)
PLATELET COUNT, POC: 333 10*3/uL (ref 142–424)
POC GRANULOCYTE: 4.5 (ref 2–6.9)
POC LYMPH %: 24.7 % (ref 10–50)
RBC: 4.46 M/uL (ref 4.04–5.48)
RDW, POC: 14.7 %
WBC: 6.6 10*3/uL (ref 4.6–10.2)

## 2014-09-15 MED ORDER — TRAMADOL HCL 50 MG PO TABS: 50.0000 mg | ORAL_TABLET | Freq: Four times a day (QID) | ORAL | Status: DC | PRN

## 2014-09-15 MED ORDER — DICLOFENAC SODIUM 1 % TD GEL: 2.0000 g | Freq: Four times a day (QID) | TRANSDERMAL | Status: DC

## 2014-09-15 NOTE — Patient Instructions (Addendum)
We will arrange a appt with Rheumotology and Cardiology. Use the Voltaren Gel for your hand. Use the tramadol for pain only if needed - hold the other pain meds. Elevate hand as much as possible

## 2014-09-15 NOTE — Progress Notes (Signed)
Subjective:    Patient ID: [name], female    DOB: 08/23/1940, 74 y.o.   MRN: [id number]  HPI Patient here today for left hand pain. She states that this hand pain started about 2 weeks ago and last night it the pain traveled up her left arm. She called EMS early this morning and EKG and breathing was normal per patient. The patient has a history of ulcerative colitis for which she is taking Humira.       Patient Active Problem List   Diagnosis Date Noted  . Metabolic syndrome 56/21/3086  . Hypokalemia 03/11/2013  . Bradycardia 05/03/2011  . Fibromyalgia 11/04/2010  . Depression 11/04/2010  . Essential hypertension 03/08/2007  . ULCERATIVE COLITIS, LEFT SIDED 03/08/2007   Outpatient Encounter Prescriptions as of 09/15/2014  Medication Sig  . acetaminophen (TYLENOL) 500 MG tablet Take 500-1,000 mg by mouth every 6 (six) hours as needed. FOR PAIN  . Adalimumab (HUMIRA PEN [place]) Inject 40 mg into the skin. Every 2 weeks  . ALPRAZolam (XANAX) 0.5 MG tablet TAKE 1 TABLET TWICE DAILY AS NEEDED FOR ANXIETY.  Marland Kitchen antiseptic oral rinse (BIOTENE) LIQD 1 application by Mouth Rinse route as needed. FOR DRY MOUTH   . B Complex CAPS Take 1 capsule by mouth every morning.   . benazepril-hydrochlorthiazide (LOTENSIN HCT) 20-12.5 MG per tablet Take 1 tablet by mouth daily.  . Cholecalciferol (VITAMIN D3) 1000 UNITS CAPS Take 1 capsule by mouth. Patient states that she alternates with Hair, Skin, and Nails.  Marland Kitchen co-enzyme Q-10 50 MG capsule Take 50 mg by mouth once a week.   . diphenhydrAMINE (BENADRYL) 25 MG tablet Take 25 mg by mouth at bedtime as needed for sleep.  . diphenoxylate-atropine (LOMOTIL) 2.5-0.025 MG per tablet Take 1 tablet by mouth 3 (three) times daily as needed for diarrhea or loose stools.  Marland Kitchen glucose blood (ONETOUCH VERIO) test strip Test 1 x per day and prn  Dx 250.02  . hydrocortisone (PROCTOSOL HC) 2.5 % rectal cream Place 1 application rectally 2 (two) times daily.  Marland Kitchen  loperamide (IMODIUM) 2 MG capsule Take 1 capsule (2 mg total) by mouth every 8 (eight) hours as needed for diarrhea or loose stools.  . mesalamine (CANASA) 1000 MG suppository Place 1 suppository (1,000 mg total) rectally at bedtime.  . Multiple Vitamins-Minerals (WOMENS MULTI VITAMIN & MINERAL PO) Take 1 tablet by mouth every other day.   . Omega-3 Fatty Acids (FISH OIL) 1000 MG CPDR Take 1 capsule by mouth every evening.   . ondansetron (ZOFRAN) 4 MG tablet Take 4 mg by mouth every 8 (eight) hours as needed for nausea or vomiting.  . ondansetron (ZOFRAN) 8 MG tablet TAKE (1) TABLET THREE TIMES DAILY AS NEEDED FOR NAUSEA & VOMITING.  Marland Kitchen oxyCODONE (OXY IR/ROXICODONE) 5 MG immediate release tablet Take 1 tablet (5 mg total) by mouth 3 (three) times daily as needed for severe pain.  . Probiotic Product (PROBIOTIC FORMULA PO) Take 1-2 capsules by mouth every morning.   . vitamin A 10000 UNIT capsule Take 10,000 Units by mouth once a week.   . vitamin C (ASCORBIC ACID) 500 MG tablet Take 500 mg by mouth once a week.   . vitamin E 400 UNIT capsule Take 400 Units by mouth every morning.   . zolpidem (AMBIEN) 5 MG tablet Take 1 tablet (5 mg total) by mouth at bedtime as needed for sleep.   No facility-administered encounter medications on file as of 09/15/2014.  Review of Systems  Constitutional: Negative.   HENT: Negative.   Eyes: Negative.   Respiratory: Negative.   Cardiovascular: Negative.   Gastrointestinal: Negative.   Endocrine: Negative.   Genitourinary: Negative.   Musculoskeletal: Positive for myalgias (left arm pain) and arthralgias (left hand pain - knots in palm of hand.).  Skin: Negative.   Allergic/Immunologic: Negative.   Neurological: Negative.   Hematological: Negative.   Psychiatric/Behavioral: Negative.        Objective:   Physical Exam  Constitutional: She is oriented to person, place, and time. She appears well-developed and well-nourished. No distress.  HENT:    Head: Normocephalic.  Eyes: Conjunctivae and EOM are normal. Pupils are equal, round, and reactive to light. Right eye exhibits no discharge. Left eye exhibits no discharge. No scleral icterus.  Neck: Normal range of motion. Neck supple. No thyromegaly present.  No carotid bruits  Cardiovascular: Normal rate, regular rhythm and normal heart sounds.   No murmur heard. Pulmonary/Chest: Effort normal and breath sounds normal. She has no wheezes. She has no rales.  Musculoskeletal: Normal range of motion. She exhibits tenderness.  There were tender nodules without rubor or erythema in the palmar aspect of the left hand.  Lymphadenopathy:    She has no cervical adenopathy.  Neurological: She is alert and oriented to person, place, and time.  Skin: Skin is warm and dry. No rash noted.  Psychiatric: She has a normal mood and affect. Her behavior is normal. Judgment and thought content normal.  Nursing note and vitals reviewed.  BP 130/77 mmHg  Pulse 75  Temp(Src) 96.9 F (36.1 C) (Oral)  Ht _0  (1.727 m)  Wt 151 lb (68.493 kg)  BMI 22.96 kg/m2  EKG: Q waves in V1 through 3 and these were not present 2 years ago. There was no ST segment elevation.  After examining the patient I spoke with the pharmacy and with the on-call gastroenterologist regarding the best way to manage her hand pain and any association of the nodules with humira. He was not aware of any association. He will relay the information to her primary gastroenterologist. For the hand pain we decided to use Voltaren gel short-term. Because of the family history of heart disease even though there were no acute changes on the EKG we will do a referral to the cardiologist to further evaluate the patient's heart because of the left arm pain.. We will also do a referral to the rheumatologist to evaluate the nodules in the hand.     Assessment & Plan:  1. Left arm pain -This appears to be more like a neuropathy coming from her left  hand nodules. - EKG 12-Lead - POCT CBC - BMP8+EGFR  2. Left hand pain -There were 3 or 4 nodules in the left hand that were tender to palpation.  3. Neck pain on left side -Good mobility.  Meds ordered this encounter  Medications  . traMADol (ULTRAM) 50 MG tablet    Sig: Take 1 tablet (50 mg total) by mouth every 6 (six) hours as needed.    Dispense:  30 tablet    Refill:  0  . diclofenac sodium (VOLTAREN) 1 % GEL    Sig: Apply 2 g topically 4 (four) times daily.    Dispense:  100 g    Refill:  0    The patient will discontinue the hydrocodone because she feels like she's having some reactions to this.  Patient Instructions  We will arrange a appt with  Rheumotology and Cardiology. Use the Voltaren Gel for your hand. Use the tramadol for pain only if needed - hold the other pain meds. Elevate hand as much as possible   Arrie Senate MD

## 2014-09-15 NOTE — Telephone Encounter (Signed)
Dr. Morrie Sheldon saw patient this morning. He called me. Patient developed painful nodules in her hand which radiated up into her arm. She actually called EMS last night. Seen in the office today - painful nodules as noted.  EKG with some changes but nothing acute.  He asked me about systemic NSAID therapy for her hand. I did not recommend this approach. He is going to try course of Voltaren gel and is getting her to see the rheumatologist. By Dr. Tawanna Sat report, patient's ulcerative colitis is doing very well on Humira. He asked me to pass his update along to Dr. [name].

## 2014-09-16 LAB — BMP8+EGFR
BUN / CREAT RATIO: 17 (ref 11–26)
BUN: 25 mg/dL (ref 8–27)
CALCIUM: 9.5 mg/dL (ref 8.7–10.3)
CO2: 25 mmol/L (ref 18–29)
Chloride: 100 mmol/L (ref 97–108)
Creatinine, Ser: 1.49 mg/dL — ABNORMAL HIGH (ref 0.57–1.00)
GFR calc non Af Amer: 35 mL/min/{1.73_m2} — ABNORMAL LOW (ref >59)
GFR, EST AFRICAN AMERICAN: 40 mL/min/{1.73_m2} — AB (ref >59)
GLUCOSE: 117 mg/dL — AB (ref 65–99)
Potassium: 4.5 mmol/L (ref 3.5–5.2)
SODIUM: 139 mmol/L (ref 134–144)

## 2014-09-16 NOTE — Telephone Encounter (Signed)
Agree with Dr. Faith Rogue recommendations. Patient's disease has been very difficult to control and NSAID. He should be avoided at all cost.

## 2014-09-17 NOTE — Addendum Note (Signed)
Addended by: Zannie Cove on: 09/17/2014 04:22 PM   Modules accepted: Orders

## 2014-09-18 ENCOUNTER — Telehealth: Payer: Self-pay | Admitting: Family Medicine

## 2014-09-18 NOTE — Telephone Encounter (Signed)
Prior authorization for voltaren gel must be approved by her insurance and it can take up to a week for them to respond.  Please let us know if you would like to try something else.

## 2014-09-25 ENCOUNTER — Encounter (INDEPENDENT_AMBULATORY_CARE_PROVIDER_SITE_OTHER): Payer: Self-pay | Admitting: Internal Medicine

## 2014-09-25 ENCOUNTER — Ambulatory Visit (INDEPENDENT_AMBULATORY_CARE_PROVIDER_SITE_OTHER): Payer: Medicare Other | Admitting: Internal Medicine

## 2014-09-25 VITALS — BP 130/80 | HR 78 | Temp 98.9°F | Resp 18 | Ht 68.5 in | Wt 150.7 lb

## 2014-09-25 DIAGNOSIS — K51918 Ulcerative colitis, unspecified with other complication: Secondary | ICD-10-CM | POA: Diagnosis not present

## 2014-09-25 DIAGNOSIS — M720 Palmar fascial fibromatosis [Dupuytren]: Secondary | ICD-10-CM

## 2014-09-25 MED ORDER — TRAMADOL HCL 50 MG PO TABS: 50.0000 mg | ORAL_TABLET | Freq: Four times a day (QID) | ORAL | Status: DC | PRN

## 2014-09-25 MED ORDER — PREDNISONE 10 MG PO TABS: 30.0000 mg | ORAL_TABLET | Freq: Every day | ORAL | Status: DC

## 2014-09-25 NOTE — Patient Instructions (Signed)
Prednisone schedule as follows. 30 mg daily for 7 days 25 mg daily for 7 days. 20 daily for 7 days. 15 mg daily for 7 days. 10 mg daily for 7 days. 5 mg  for 7 days and stop

## 2014-09-25 NOTE — Progress Notes (Signed)
Presenting complaint;  Follow-up for ulcerative colitis.  Subjective:  Patient is 74 year old Caucasian female who has chronic ulcerative colitis limited sigmoid colon and rectum who presents for scheduled visit. She was last seen on 06/05/2014 and reported feeling 75% better. Now she is having problems. On 09/14/2014 while she was in bed she noted left arm pain radiating jaw. She 2 baby aspirins and called EMS. They advised her to take 4 more doses of baby aspirin. She had an EKG at home which was unremarkable. Patient refused to go to emergency room. She was seen by Dr. Redge Gainer the next day and repeating EKG was unremarkable. She is scheduled to be seen by Dr. Silverio Decamp current on 11/14/2014. She hasn't had any more spells of this pain. However she has noted lower abdominal pain in the last 10-12 days. She is having multiple bowel movements per day even though her stools are formed. Defecation is painful. By months ago she had diarrhea. She says appetite is not good since she has been taken tramadol for pain in her left hand. She developed nodules in her left palm about a month ago. She has an appointment to be seen by rheumatologist. She has not taken any antibiotics recently. She is not having any side effects with Humira other than feeling tired for a day or 2 after each dose. He has not lost any weight since her last visit.   Current Medications: Outpatient Encounter Prescriptions as of 09/25/2014  Medication Sig  . Adalimumab (HUMIRA PEN [place]) Inject 40 mg into the skin. Every 2 weeks  . ALPRAZolam (XANAX) 0.5 MG tablet TAKE 1 TABLET TWICE DAILY AS NEEDED FOR ANXIETY.  Marland Kitchen antiseptic oral rinse (BIOTENE) LIQD 1 application by Mouth Rinse route as needed. FOR DRY MOUTH   . B Complex CAPS Take 1 capsule by mouth every morning.   . benazepril-hydrochlorthiazide (LOTENSIN HCT) 20-12.5 MG per tablet Take 1 tablet by mouth daily.  . Cholecalciferol (VITAMIN D3) 1000 UNITS CAPS Take 1 capsule by mouth.  Patient states that she alternates with Hair, Skin, and Nails.  Marland Kitchen co-enzyme Q-10 50 MG capsule Take 50 mg by mouth once a week.   . diphenhydrAMINE (BENADRYL) 25 MG tablet Take 25 mg by mouth at bedtime as needed for sleep.  . hydrocortisone (PROCTOSOL HC) 2.5 % rectal cream Place 1 application rectally 2 (two) times daily.  Marland Kitchen loperamide (IMODIUM) 2 MG capsule Take 1 capsule (2 mg total) by mouth every 8 (eight) hours as needed for diarrhea or loose stools.  . Multiple Vitamins-Minerals (WOMENS MULTI VITAMIN & MINERAL PO) Take 1 tablet by mouth every other day.   . Omega-3 Fatty Acids (FISH OIL) 1000 MG CPDR Take 1 capsule by mouth every evening.   . Probiotic Product (PROBIOTIC FORMULA PO) Take 1-2 capsules by mouth every morning.   . traMADol (ULTRAM) 50 MG tablet Take 1 tablet (50 mg total) by mouth every 6 (six) hours as needed.  . vitamin A 10000 UNIT capsule Take 10,000 Units by mouth once a week.   . vitamin C (ASCORBIC ACID) 500 MG tablet Take 500 mg by mouth once a week.   . vitamin E 400 UNIT capsule Take 400 Units by mouth every morning.   . zolpidem (AMBIEN) 5 MG tablet Take 1 tablet (5 mg total) by mouth at bedtime as needed for sleep.  Marland Kitchen diclofenac sodium (VOLTAREN) 1 % GEL Apply 2 g topically 4 (four) times daily. (Patient not taking: Reported on 09/25/2014)  .  ondansetron (ZOFRAN) 4 MG tablet Take 4 mg by mouth every 8 (eight) hours as needed for nausea or vomiting.  Marland Kitchen oxyCODONE (OXY IR/ROXICODONE) 5 MG immediate release tablet Take 1 tablet (5 mg total) by mouth 3 (three) times daily as needed for severe pain. (Patient not taking: Reported on 09/25/2014)  . [DISCONTINUED] acetaminophen (TYLENOL) 500 MG tablet Take 500-1,000 mg by mouth every 6 (six) hours as needed. FOR PAIN  . [DISCONTINUED] diphenoxylate-atropine (LOMOTIL) 2.5-0.025 MG per tablet Take 1 tablet by mouth 3 (three) times daily as needed for diarrhea or loose stools. (Patient not taking: Reported on 09/25/2014)  .  [DISCONTINUED] glucose blood (ONETOUCH VERIO) test strip Test 1 x per day and prn  Dx 250.02 (Patient not taking: Reported on 09/25/2014)  . [DISCONTINUED] mesalamine (CANASA) 1000 MG suppository Place 1 suppository (1,000 mg total) rectally at bedtime. (Patient not taking: Reported on 09/25/2014)  . [DISCONTINUED] ondansetron (ZOFRAN) 8 MG tablet TAKE (1) TABLET THREE TIMES DAILY AS NEEDED FOR NAUSEA & VOMITING. (Patient not taking: Reported on 09/25/2014)   No facility-administered encounter medications on file as of 09/25/2014.    Objective: Blood pressure 130/80, pulse 78, temperature 98.9 F (37.2 C), temperature source Oral, resp. rate 18, height 5' 8.5" (1.74 m), weight 150 lb 11.2 oz (68.357 kg). Patient is alert and in no acute distress. Conjunctiva is pink. Sclera is nonicteric Oropharyngeal mucosa is normal. No neck masses or thyromegaly noted. Cardiac exam with regular rhythm normal S1 and S2. No murmur or gallop noted. Lungs are clear to auscultation. Abdomen is symmetrical and soft. She has mild tenderness at LLQ. No organomegaly or masses. No LE edema or clubbing noted.  Labs/studies Results: CBC from 09/15/2014  WBC 6.6, H&H 11.8 and 38.8 and platelet count 333K   Assessment:  #1. Distal ulcerative colitis. Patient's symptoms suggested relapse. I doubt that it has anything to do with single dose of aspirin that she took about 11 days ago. She does not appear to be acutely ill. #2. Dupuytren's contracture left hand. Patient has an appointment to see rheumatologist. His condition has not been reported with Humira. #3. Left arm pain presumed to be neuropathic.   Plan:  Continue Humira at 40 mg subcutaneous every 14 days. Patient will go back on Canasa suppositories 1 per rectum daily at bedtime for one month. She already has a prescription. Prednisone 30 mg by mouth every morning for 1 week and thereafter she will reduce the dose by 5 mg every week. New prescription given for  tramadol 50 mg by mouth every 6 when necessary 60 doses without a refill. Office visit in 2 months.

## 2014-09-26 ENCOUNTER — Telehealth: Payer: Self-pay

## 2014-09-26 NOTE — Telephone Encounter (Signed)
The patient because of a gastrointestinal problem with Crohn's disease is unable to take any NSAIDs. She needs something for joint pain and this is the only thing that we can use on her to help control the joint pain

## 2014-09-26 NOTE — Telephone Encounter (Signed)
Still waiting to have Voltaren gel changed to something else because insurance won't pay

## 2014-09-27 NOTE — Telephone Encounter (Signed)
Pt aware to have pharm fax a prior approval

## 2014-09-28 ENCOUNTER — Telehealth: Payer: Self-pay

## 2014-09-28 NOTE — Telephone Encounter (Signed)
Insurance prior authorized Diclofenac for as long as the patient has this policy

## 2014-10-19 ENCOUNTER — Telehealth (INDEPENDENT_AMBULATORY_CARE_PROVIDER_SITE_OTHER): Payer: Self-pay | Admitting: *Deleted

## 2014-10-19 NOTE — Telephone Encounter (Signed)
Patient was called and made aware that we hasd rec'd a request from [name] . This is regarding the assistance for the [facility]. She states that she had not heard nor rec'd anything. I am mailing out to her a copy of her part to complete. We have completed ours. She will complete and mail back to [place], then we will fax this to [employer].

## 2014-11-07 ENCOUNTER — Telehealth: Payer: Self-pay | Admitting: Nurse Practitioner

## 2014-11-07 DIAGNOSIS — R1030 Lower abdominal pain, unspecified: Secondary | ICD-10-CM

## 2014-11-07 MED ORDER — OXYCODONE HCL 5 MG PO TABS: 5.0000 mg | ORAL_TABLET | Freq: Three times a day (TID) | ORAL | Status: DC | PRN

## 2014-11-07 NOTE — Telephone Encounter (Signed)
Oxycodone rx ready for pick up

## 2014-11-14 ENCOUNTER — Encounter: Payer: Self-pay | Admitting: Cardiology

## 2014-11-14 ENCOUNTER — Ambulatory Visit (INDEPENDENT_AMBULATORY_CARE_PROVIDER_SITE_OTHER): Payer: Medicare Other | Admitting: Cardiology

## 2014-11-14 VITALS — BP 100/60 | HR 76 | Ht 68.0 in | Wt 149.0 lb

## 2014-11-14 DIAGNOSIS — M79603 Pain in arm, unspecified: Secondary | ICD-10-CM | POA: Insufficient documentation

## 2014-11-14 DIAGNOSIS — M79602 Pain in left arm: Secondary | ICD-10-CM | POA: Diagnosis not present

## 2014-11-14 NOTE — Patient Instructions (Addendum)
Medication Instructions:  Your physician recommends that you continue on your current medications as directed. Please refer to the Current Medication list given to you today.  Testing/Procedures: Your physician has requested that you have an exercise tolerance test at [facility].  Please call their office to be scheduled.   For further information please visit [email or web address]. Please also follow instruction sheet, as given.  Follow-Up: As needed.  Thank you for choosing [facility]!!

## 2014-11-14 NOTE — Progress Notes (Signed)
Cardiology Office Note   Date:  11/14/2014   ID:  [name], DOB 26-Dec-1940, MRN [id number]  PCP:  [name], FNP  Cardiologist:   Minus Breeding, MD   Chief Complaint  Patient presents with  . Arm Pain      History of Present Illness: [name] is a 74 y.o. female who presents for evaluation of left arm pain. She has had no prior cardiac history though she's been seen a couple of times in the past. I found an old echocardiogram 2008 that was normal. She's had previous stress testing that was apparently normal. I see record of this from 2007. She's not had any prior cardiac diagnosis but she does have a strong family history of early obstructive coronary disease. She did have some left arm pain and she reports an episode in June. She describes some pain in her shoulder. She called EMS. She did not want to be transported at that time. She saw Dr. Laurance Flatten. She was no longer having this. She didn't have any other symptoms. There were no acute abnormalities on the EKG. She was referred here for further evaluation. Since that time she's not had any other symptoms. She had some discomfort in her hand if she does something like weeds the garden. However, she hasn't had any arm or chest discomfort. She's not had any new shortness of breath, PND or orthopnea. She's had no new palpitations, presyncope or syncope.    Past Medical History  Diagnosis Date  . Hypertension   . Ulcerative colitis   . Fibromyalgia   . Hyperglycemia, drug-induced 05/02/2011  . Anemia due to blood loss 05/02/2011    Past Surgical History  Procedure Laterality Date  . Cholecystectomy      s/p  . Colonoscopy  06/16/06. 04/27/2007    proctitis seen, biopsies showed chronic active colitis, no dysplasia   . Abdominal hysterectomy       Current Outpatient Prescriptions  Medication Sig Dispense Refill  . Adalimumab (HUMIRA PEN [place]) Inject 40 mg into the skin. Every 2 weeks    . ALPRAZolam (XANAX) 0.5 MG  tablet Take 0.5 mg by mouth 2 (two) times daily as needed for anxiety.    Marland Kitchen antiseptic oral rinse (BIOTENE) LIQD 1 application by Mouth Rinse route as needed. FOR DRY MOUTH     . B Complex CAPS Take 1 capsule by mouth every morning.     . benazepril-hydrochlorthiazide (LOTENSIN HCT) 20-12.5 MG per tablet Take 1 tablet by mouth daily. 30 tablet 5  . Cholecalciferol (VITAMIN D3) 1000 UNITS CAPS Take 1 capsule by mouth. Patient states that she alternates with Hair, Skin, and Nails.    Marland Kitchen co-enzyme Q-10 50 MG capsule Take 50 mg by mouth once a week.     . diclofenac sodium (VOLTAREN) 1 % GEL Apply 2 g topically 4 (four) times daily. 100 g 0  . diphenhydrAMINE (BENADRYL) 25 MG tablet Take 25 mg by mouth at bedtime as needed for sleep.    . hydrocortisone (PROCTOSOL HC) 2.5 % rectal cream Place 1 application rectally 2 (two) times daily. 30 g 0  . loperamide (IMODIUM) 2 MG capsule Take 1 capsule (2 mg total) by mouth every 8 (eight) hours as needed for diarrhea or loose stools. 30 capsule 0  . Multiple Vitamins-Minerals (WOMENS MULTI VITAMIN & MINERAL PO) Take 1 tablet by mouth every other day.     . Omega-3 Fatty Acids (FISH OIL) 1000 MG CPDR Take 1 capsule by  mouth every evening.     . ondansetron (ZOFRAN) 4 MG tablet Take 4 mg by mouth every 8 (eight) hours as needed for nausea or vomiting.    Marland Kitchen oxyCODONE (OXY IR/ROXICODONE) 5 MG immediate release tablet Take 1 tablet (5 mg total) by mouth 3 (three) times daily as needed for severe pain. 60 tablet 0  . predniSONE (DELTASONE) 5 MG tablet Take 5 mg by mouth daily with breakfast.    . Probiotic Product (PROBIOTIC FORMULA PO) Take 1-2 capsules by mouth every morning.     . vitamin A 10000 UNIT capsule Take 10,000 Units by mouth once a week.     . vitamin C (ASCORBIC ACID) 500 MG tablet Take 500 mg by mouth once a week.     . vitamin E 400 UNIT capsule Take 400 Units by mouth every morning.     . zolpidem (AMBIEN) 5 MG tablet Take 1 tablet (5 mg total) by  mouth at bedtime as needed for sleep. 30 tablet 3  . [DISCONTINUED] sodium chloride (OCEAN) 0.65 % SOLN nasal spray Place 2 sprays into the nose as needed for congestion.     No current facility-administered medications for this visit.    Allergies:   Azathioprine; Tape; Codeine; Penicillins; Povidone-iodine; Procaine hcl; Sulfonamide derivatives; Tramadol; Azithromycin; Clonazepam; Dairy aid; Latex; and Niacin and related    Social History:  The patient  reports that she has never smoked. She has never used smokeless tobacco. She reports that she drinks alcohol. She reports that she does not use illicit drugs.   Family History:  The patient's family history includes Cancer in her sister; Diabetes in her brother, father, and mother; Heart attack (age of onset: 51) in her brother; Heart attack (age of onset: 61) in her father and mother.    ROS:  Please see the history of present illness.   Otherwise, review of systems are positive for positive for dizziness, decreased hearing, diarrhea, cramping, rhinitis.   All other systems are reviewed and negative.    PHYSICAL EXAM: VS:  BP 100/60 mmHg  Pulse 76  Ht 5' 8"  (1.727 m)  Wt 149 lb (67.586 kg)  BMI 22.66 kg/m2 , BMI Body mass index is 22.66 kg/(m^2). GENERAL:  Well appearing HEENT:  Pupils equal round and reactive, fundi not visualized, oral mucosa unremarkable NECK:  No jugular venous distention, waveform within normal limits, carotid upstroke brisk and symmetric, no bruits, no thyromegaly LYMPHATICS:  No cervical, inguinal adenopathy LUNGS:  Clear to auscultation bilaterally BACK:  No CVA tenderness CHEST:  Unremarkable HEART:  PMI not displaced or sustained,S1 and S2 within normal limits, no S3, no S4, no clicks, no rubs, no murmurs ABD:  Flat, positive bowel sounds normal in frequency in pitch, no bruits, no rebound, no guarding, no midline pulsatile mass, no hepatomegaly, no splenomegaly EXT:  2 plus pulses throughout, no edema, no  cyanosis no clubbing SKIN:  No rashes no nodules NEURO:  Cranial nerves II through XII grossly intact, motor grossly intact throughout PSYCH:  Cognitively intact, oriented to person place and time    EKG:  EKG is not ordered today. The ekg ordered 6/25 demonstrates sinus rhythm, rate 81, poor anterior R wave progression and could not exclude old anteroseptal infarct area   Recent Labs: 06/08/2014: ALT 11; Platelets 445* 09/15/2014: BUN 25; Creatinine, Ser 1.49*; Hemoglobin 11.8*; Potassium 4.5; Sodium 139    Lipid Panel    Component Value Date/Time   CHOL 199 06/08/2014 1639  CHOL 200* 08/01/2012 1100   TRIG 294* 06/08/2014 1639   TRIG 171* 08/01/2012 1100   TRIG 129 03/04/2007 0420   HDL 44 06/08/2014 1639   HDL 55 08/01/2012 1100   HDL 27* 03/04/2007 0420   CHOLHDL 6.1 03/04/2007 0420   VLDL 26 03/04/2007 0420   LDLCALC 81 12/28/2013 1106   LDLCALC 111* 08/01/2012 1100   LDLCALC * 03/04/2007 0420    112        Total Cholesterol/HDL:CHD Risk Coronary Heart Disease Risk Table                     Men   Women  1/2 Average Risk   3.4   3.3      Wt Readings from Last 3 Encounters:  11/14/14 149 lb (67.586 kg)  09/25/14 150 lb 11.2 oz (68.357 kg)  09/15/14 151 lb (68.493 kg)      Other studies Reviewed: Additional studies/ records that were reviewed today include: Previous echo report, PCP records, EKG. Review of the above records demonstrates:  Please see elsewhere in the note.     ASSESSMENT AND PLAN:  ARM PAIN:  I think it is unlikely that this represents obstructive coronary disease. However, she has a significant family history. I will bring the patient back for a POET (Plain Old Exercise Test). This will allow me to screen for obstructive coronary disease, risk stratify and very importantly provide a prescription for exercise.  HTN:  The blood pressure is at target. No change in medications is indicated. We will continue with therapeutic lifestyle changes  (TLC).   Current medicines are reviewed at length with the patient today.  The patient does not have concerns regarding medicines.  The following changes have been made:  no change  Labs/ tests ordered today include:  POET (Plain Old Exercise Treadmill)    Disposition:   FU with me as needed.     Signed, Minus Breeding, MD  11/14/2014 5:45 PM    [place]

## 2014-11-28 ENCOUNTER — Other Ambulatory Visit: Payer: Self-pay | Admitting: *Deleted

## 2014-11-28 MED ORDER — ALPRAZOLAM 0.5 MG PO TABS: 0.5000 mg | ORAL_TABLET | Freq: Two times a day (BID) | ORAL | Status: DC | PRN

## 2014-11-28 NOTE — Telephone Encounter (Signed)
Please call in xanax with 0 refills no more refills without being seen

## 2014-11-28 NOTE — Telephone Encounter (Signed)
Last filled 10/26/14, last seen 05/29/14. Route to pool, call in at [facility]

## 2014-12-05 ENCOUNTER — Telehealth (INDEPENDENT_AMBULATORY_CARE_PROVIDER_SITE_OTHER): Payer: Self-pay | Admitting: *Deleted

## 2014-12-05 DIAGNOSIS — K5120 Ulcerative (chronic) proctitis without complications: Secondary | ICD-10-CM

## 2014-12-05 NOTE — Telephone Encounter (Signed)
Lyrica is having left side pain that has been going on for about a month. Would like to see if something could be called infor her until her apt on 12/18/14. The return phone number is [phone].        Nicoletta Dress

## 2014-12-06 NOTE — Telephone Encounter (Signed)
[name] called back asking for Terri or Dr. [name] to please call her something in. The return phone number is [phone].

## 2014-12-06 NOTE — Telephone Encounter (Signed)
C/o LLQ pain which she has apparently is chronic. Am going to get a CBC and CRP on her.

## 2014-12-06 NOTE — Telephone Encounter (Signed)
Labs ordered.

## 2014-12-07 LAB — CBC WITH DIFFERENTIAL/PLATELET
Basophils Absolute: 0.1 10*3/uL (ref 0.0–0.1)
Basophils Relative: 1 % (ref 0–1)
Eosinophils Absolute: 0.2 10*3/uL (ref 0.0–0.7)
Eosinophils Relative: 4 % (ref 0–5)
HEMATOCRIT: 34.8 % — AB (ref 36.0–46.0)
HEMOGLOBIN: 11.7 g/dL — AB (ref 12.0–15.0)
LYMPHS PCT: 35 % (ref 12–46)
Lymphs Abs: 2.1 10*3/uL (ref 0.7–4.0)
MCH: 28.7 pg (ref 26.0–34.0)
MCHC: 33.6 g/dL (ref 30.0–36.0)
MCV: 85.3 fL (ref 78.0–100.0)
MONO ABS: 0.7 10*3/uL (ref 0.1–1.0)
MONOS PCT: 11 % (ref 3–12)
MPV: 9.2 fL (ref 8.6–12.4)
Neutro Abs: 2.9 10*3/uL (ref 1.7–7.7)
Neutrophils Relative %: 49 % (ref 43–77)
Platelets: 369 10*3/uL (ref 150–400)
RBC: 4.08 MIL/uL (ref 3.87–5.11)
RDW: 15 % (ref 11.5–15.5)
WBC: 6 10*3/uL (ref 4.0–10.5)

## 2014-12-07 LAB — C-REACTIVE PROTEIN: CRP: 1.8 mg/dL — ABNORMAL HIGH (ref <0.60)

## 2014-12-17 ENCOUNTER — Telehealth: Payer: Self-pay | Admitting: Nurse Practitioner

## 2014-12-17 NOTE — Telephone Encounter (Signed)
Patient notified that we are not yet set up to do treadmills and we are not sure when we will be. Advised patient to contact Dr Percival Spanish for further instruction. Patient verbalized understanding

## 2014-12-18 ENCOUNTER — Ambulatory Visit (INDEPENDENT_AMBULATORY_CARE_PROVIDER_SITE_OTHER): Payer: Medicare Other | Admitting: Internal Medicine

## 2014-12-18 ENCOUNTER — Encounter (INDEPENDENT_AMBULATORY_CARE_PROVIDER_SITE_OTHER): Payer: Self-pay | Admitting: Internal Medicine

## 2014-12-18 ENCOUNTER — Encounter (INDEPENDENT_AMBULATORY_CARE_PROVIDER_SITE_OTHER): Payer: Self-pay | Admitting: *Deleted

## 2014-12-18 ENCOUNTER — Other Ambulatory Visit (INDEPENDENT_AMBULATORY_CARE_PROVIDER_SITE_OTHER): Payer: Self-pay | Admitting: *Deleted

## 2014-12-18 VITALS — BP 120/70 | HR 80 | Temp 97.6°F | Resp 18 | Ht 68.5 in | Wt 152.8 lb

## 2014-12-18 DIAGNOSIS — R110 Nausea: Secondary | ICD-10-CM | POA: Diagnosis not present

## 2014-12-18 DIAGNOSIS — K51919 Ulcerative colitis, unspecified with unspecified complications: Secondary | ICD-10-CM

## 2014-12-18 DIAGNOSIS — O26899 Other specified pregnancy related conditions, unspecified trimester: Secondary | ICD-10-CM

## 2014-12-18 DIAGNOSIS — R1030 Lower abdominal pain, unspecified: Secondary | ICD-10-CM | POA: Diagnosis not present

## 2014-12-18 DIAGNOSIS — R109 Unspecified abdominal pain: Secondary | ICD-10-CM

## 2014-12-18 DIAGNOSIS — K5190 Ulcerative colitis, unspecified, without complications: Secondary | ICD-10-CM

## 2014-12-18 MED ORDER — OXYCODONE HCL 5 MG PO TABS: 5.0000 mg | ORAL_TABLET | Freq: Three times a day (TID) | ORAL | Status: DC | PRN

## 2014-12-18 MED ORDER — ONDANSETRON HCL 4 MG PO TABS: 4.0000 mg | ORAL_TABLET | Freq: Two times a day (BID) | ORAL | Status: DC | PRN

## 2014-12-18 MED ORDER — DICYCLOMINE HCL 20 MG PO TABS: 10.0000 mg | ORAL_TABLET | Freq: Two times a day (BID) | ORAL | Status: DC

## 2014-12-18 NOTE — Progress Notes (Signed)
Presenting complaint;  Follow-up for ulcerative colitis.  Subjective:  Patient is 74 year old Caucasian female with history of distal ulcerative colitis who is here for scheduled visit. She was last seen on 09/25/2014 and felt to have relapsed and treated with prednisone and Canasa suppository. She's been off prednisone for about 6 weeks. She does not feel well. She is having anywhere from 6-15 stools per day. Most of his stools are small volume but loose. She denies frank rectal bleeding. She does see some blood on her tissue which she believes is secondary to hemorrhoids. She complains of pain in left lower quadrant. She is also complaining of back pain. She states she pulled a muscle while she was pulling the weeds. She seen her chiropractor which has helped but she needs to see him again. She denies fever or chills. She has nausea but no vomiting. She needs a refill for ondansetron and oxycodone. She takes pain medication both for abdominal and back pain. She has not lost any weight since her last visit. She states on days like this she refrains from eating her meals and also takes Imodium so so that she can control her bowel movements. He continues to have pain in her left hand. She saw Dr.Syed rheumatologist and she is trying to make a follow-up appointment with her.   Current Medications: Outpatient Encounter Prescriptions as of 12/18/2014  Medication Sig  . Adalimumab (HUMIRA PEN [place]) Inject 40 mg into the skin. Every 2 weeks  . ALPRAZolam (XANAX) 0.5 MG tablet Take 1 tablet (0.5 mg total) by mouth 2 (two) times daily as needed for anxiety.  Marland Kitchen antiseptic oral rinse (BIOTENE) LIQD 1 application by Mouth Rinse route as needed. FOR DRY MOUTH   . B Complex CAPS Take 1 capsule by mouth every morning.   . benazepril-hydrochlorthiazide (LOTENSIN HCT) 20-12.5 MG per tablet Take 1 tablet by mouth daily.  . Cholecalciferol (VITAMIN D3) 1000 UNITS CAPS Take 1 capsule by mouth. Patient states that she  alternates with Hair, Skin, and Nails.  Marland Kitchen co-enzyme Q-10 50 MG capsule Take 50 mg by mouth once a week.   . diphenhydrAMINE (BENADRYL) 25 MG tablet Take 25 mg by mouth at bedtime as needed for sleep.  . hydrocortisone (PROCTOSOL HC) 2.5 % rectal cream Place 1 application rectally 2 (two) times daily.  Marland Kitchen loperamide (IMODIUM) 2 MG capsule Take 1 capsule (2 mg total) by mouth every 8 (eight) hours as needed for diarrhea or loose stools.  . Multiple Vitamins-Minerals (WOMENS MULTI VITAMIN & MINERAL PO) Take 1 tablet by mouth every other day.   . Omega-3 Fatty Acids (FISH OIL) 1000 MG CPDR Take 1 capsule by mouth every evening.   . ondansetron (ZOFRAN) 4 MG tablet Take 4 mg by mouth every 8 (eight) hours as needed for nausea or vomiting.  Marland Kitchen oxyCODONE (OXY IR/ROXICODONE) 5 MG immediate release tablet Take 1 tablet (5 mg total) by mouth 3 (three) times daily as needed for severe pain.  . Probiotic Product (PROBIOTIC FORMULA PO) Take 1-2 capsules by mouth every morning.   . vitamin A 10000 UNIT capsule Take 10,000 Units by mouth once a week.   . vitamin C (ASCORBIC ACID) 500 MG tablet Take 500 mg by mouth once a week.   . vitamin E 400 UNIT capsule Take 400 Units by mouth every morning.   . zolpidem (AMBIEN) 5 MG tablet Take 1 tablet (5 mg total) by mouth at bedtime as needed for sleep.  . [DISCONTINUED] diclofenac sodium (  VOLTAREN) 1 % GEL Apply 2 g topically 4 (four) times daily. (Patient not taking: Reported on 12/18/2014)  . [DISCONTINUED] predniSONE (DELTASONE) 5 MG tablet Take 5 mg by mouth daily with breakfast.   No facility-administered encounter medications on file as of 12/18/2014.     Objective: Blood pressure 120/70, pulse 80, temperature 97.6 F (36.4 C), temperature source Oral, resp. rate 18, height 5' 8.5" (1.74 m), weight 152 lb 12.8 oz (69.31 kg). Patient is alert and in no acute distress. Conjunctiva is pink. Sclera is nonicteric Oropharyngeal mucosa is normal. No neck masses or  thyromegaly noted. Cardiac exam with regular rhythm normal S1 and S2. No murmur or gallop noted. Lungs are clear to auscultation. Abdomen is symmetrical. Bowel sounds are normal. On palpation abdomen is soft with mild tenderness at LLQ. No organomegaly or masses. No LE edema or clubbing noted.  Labs/studies Results: Lab data from 12/06/2014  WBC 6.0, H&H 11.7 and 34.8 and platelet count 360 9K.   CRP 1.8; it was 0.9 eleven months ago.  Assessment:  #1. Distal ulcerative colitis. Last colonoscopy was in June 2012. Her disease has never uncontrolled to the point of remission. Since she also has irritable bowel syndrome it is difficult to determine whether not she is having a relapse of her flareup of IBS. If disease is active 6-MP could be added which she took back in 2012 and was able to tolerate it. CRP has almost doubled in one year. #2. LLQ abdominal pain. Pain appears to be due to IBS, UC and also referred from her back. #3. Nausea without vomiting.   Plan:  Dicyclomine 10 mg by mouth before breakfast and lunch daily. Flexible sigmoidoscopy under propofol as she could not tolerate this procedure with conscious sedation. New prescription given for oxycodone 5 mg by mouth 3 times a day when necessary. New prescription also given for ondansetron 4 mg by mouth twice a day when necessary. Continue Humira at current dose. Office visit in 3 months.

## 2014-12-18 NOTE — Patient Instructions (Signed)
Flexible sigmoidoscopy to be scheduled.

## 2014-12-20 ENCOUNTER — Other Ambulatory Visit (INDEPENDENT_AMBULATORY_CARE_PROVIDER_SITE_OTHER): Payer: Self-pay | Admitting: *Deleted

## 2014-12-20 DIAGNOSIS — R1030 Lower abdominal pain, unspecified: Secondary | ICD-10-CM

## 2014-12-20 DIAGNOSIS — K5190 Ulcerative colitis, unspecified, without complications: Secondary | ICD-10-CM

## 2014-12-21 NOTE — Patient Instructions (Addendum)
64    Your procedure is scheduled on: 12/28/2014  Report to [facility] at  6:15 AM   AM.  Call this number if you have problems the morning of surgery: [phone]   Remember:   Do not drink or eat food:After Midnight.    Clear liquids include soda, tea, black coffee, apple or grape juice, broth.  Take these medicines the morning of surgery with A SIP OF WATER:  Xanax and Lotensin   Do not wear jewelry, make-up or nail polish.  Do not wear lotions, powders, or perfumes. You may wear deodorant.  Do not shave 48 hours prior to surgery. Men may shave face and neck.  Do not bring valuables to the hospital.  Contacts, dentures or bridgework may not be worn into surgery.  Leave suitcase in the car. After surgery it may be brought to your room.  For patients admitted to the hospital, checkout time is 11:00 AM the day of discharge.   Patients discharged the day of surgery will not be allowed to drive home.  Name and phone number of your driver:   Flexible Sigmoidoscopy Flexible sigmoidoscopy is a procedure to check your lower colon (sigmoid colon). The procedure is done using a short, flexible tube that has a tiny camera attached (sigmoidoscope). The sigmoidoscope is inserted into your anus and passed through your rectum into your sigmoid colon. The camera on the scope sends images to a television monitor.  You may need this exam if you have had changes in your bowel habits, bleeding from your rectum, or abdominal pain. You may also need this test if your health care provider wants to check for abnormal growths in your rectum or lower colon. LET [facility] CARE PROVIDER KNOW ABOUT:  Any allergies you have.  All medicines you are taking, including vitamins, herbs, eye drops, creams, and over-the-counter medicines.  Previous problems you or members of your family have had with the use of anesthetics.  Any blood disorders you have.  Previous surgeries you have had.  Medical conditions you  have. RISKS AND COMPLICATIONS Generally, this is a safe procedure. However, as with any procedure, problems can occur. Possible problems include:  Abdominal pain.  Bleeding.  Infection or inflammation in your colon.  A tear through your rectum or colon (perforation). BEFORE THE PROCEDURE  You will need to follow a special diet and use a laxative or an enema before the procedure (bowel prep). This is to clean out your colon. You may need to start your bowel prep 1-3 days before the procedure. Follow your health care provider's instructions exactly.  Do not eat or drink anything after midnight the night before the procedure or as directed by your health care provider.  You may need to have a rectal suppository or enema in the morning before your procedure.  Arrange for someone to take you home after the procedure. PROCEDURE  Sigmoidoscopy takes about 20 minutes and may include the following steps:  You may get a medicine through an IV tube to make you relaxed and drowsy (sedation).  You will lie on your side on the examination table.  The sigmoidoscope will be lubricated and gently inserted into your anus.  Air will be injected into your colon and the sigmoidoscope will be moved into your sigmoid colon. You may feel some pressure or cramping.  You may be asked to change positions at times during the procedure.  Your health care provider will check the monitor for any abnormal findings.  Your health care provider may also want to take a small piece of tissue to check under a microscope (biopsy).  Your health care provider takes a final look at your sigmoid colon and rectum as the scope is slowly removed. AFTER THE PROCEDURE  You will stay in a recovery area for a short while until your health care provider says you can go home. Document Released: 03/06/2000 Document Revised: 03/14/2013 Document Reviewed: 02/09/2013 [facility] Patient Information 2015 [place], [place]. This  information is not intended to replace advice given to you by your health care provider. Make sure you discuss any questions you have with your health care provider.   Anesthesia, Care After Refer to this sheet in the next few weeks. These instructions provide you with information on caring for yourself after your procedure. Your health care provider may also give you more specific instructions. Your treatment has been planned according to current medical practices, but problems sometimes occur. Call your health care provider if you have any problems or questions after your procedure. WHAT TO EXPECT AFTER THE PROCEDURE After the procedure, it is typical to experience:  Sleepiness.  Nausea and vomiting. HOME CARE INSTRUCTIONS  For the first 24 hours after general anesthesia:  Have a responsible person with you.  Do not drive a car. If you are alone, do not take public transportation.  Do not drink alcohol.  Do not take medicine that has not been prescribed by your health care provider.  Do not sign important papers or make important decisions.  You may resume a normal diet and activities as directed by your health care provider.  Change bandages (dressings) as directed.  If you have questions or problems that seem related to general anesthesia, call the hospital and ask for the anesthetist or anesthesiologist on call. SEEK MEDICAL CARE IF:  You have nausea and vomiting that continue the day after anesthesia.  You develop a rash. SEEK IMMEDIATE MEDICAL CARE IF:   You have difficulty breathing.  You have chest pain.  You have any allergic problems. Document Released: 06/15/2000 Document Revised: 03/14/2013 Document Reviewed: 09/22/2012 [facility] Patient Information 2015 [place], [place]. This information is not intended to replace advice given to you by your health care provider. Make sure you discuss any questions you have with your health care provider.

## 2014-12-24 ENCOUNTER — Other Ambulatory Visit: Payer: Self-pay

## 2014-12-24 ENCOUNTER — Other Ambulatory Visit (HOSPITAL_COMMUNITY): Payer: Medicare Other

## 2014-12-24 ENCOUNTER — Encounter (HOSPITAL_COMMUNITY)
Admission: RE | Admit: 2014-12-24 | Discharge: 2014-12-24 | Disposition: A | Discharge status: Home / Self Care | Payer: Medicare Other | Source: Ambulatory Visit | Attending: Internal Medicine | Admitting: Internal Medicine

## 2014-12-24 ENCOUNTER — Encounter (HOSPITAL_COMMUNITY): Payer: Self-pay

## 2014-12-24 DIAGNOSIS — K5190 Ulcerative colitis, unspecified, without complications: Secondary | ICD-10-CM | POA: Diagnosis not present

## 2014-12-24 DIAGNOSIS — R1030 Lower abdominal pain, unspecified: Secondary | ICD-10-CM | POA: Diagnosis not present

## 2014-12-24 DIAGNOSIS — Z01818 Encounter for other preprocedural examination: Secondary | ICD-10-CM | POA: Insufficient documentation

## 2014-12-24 HISTORY — DX: Anxiety disorder, unspecified: F41.9

## 2014-12-24 HISTORY — DX: Bipolar disorder, unspecified: F31.9

## 2014-12-24 LAB — BASIC METABOLIC PANEL
Anion gap: 5 (ref 5–15)
BUN: 15 mg/dL (ref 6–20)
CALCIUM: 8.6 mg/dL — AB (ref 8.9–10.3)
CHLORIDE: 105 mmol/L (ref 101–111)
CO2: 28 mmol/L (ref 22–32)
CREATININE: 1.26 mg/dL — AB (ref 0.44–1.00)
GFR calc non Af Amer: 41 mL/min — ABNORMAL LOW (ref >60)
GFR, EST AFRICAN AMERICAN: 48 mL/min — AB (ref >60)
GLUCOSE: 103 mg/dL — AB (ref 65–99)
Potassium: 4.1 mmol/L (ref 3.5–5.1)
Sodium: 138 mmol/L (ref 135–145)

## 2014-12-24 LAB — HEMOGLOBIN AND HEMATOCRIT, BLOOD
HCT: 36.2 % (ref 36.0–46.0)
HEMOGLOBIN: 11.7 g/dL — AB (ref 12.0–15.0)

## 2014-12-28 ENCOUNTER — Ambulatory Visit (HOSPITAL_COMMUNITY): Payer: Medicare Other | Admitting: Anesthesiology

## 2014-12-28 ENCOUNTER — Ambulatory Visit (HOSPITAL_COMMUNITY): Admission: RE | Admit: 2014-12-28 | Payer: Medicare Other | Source: Ambulatory Visit | Admitting: Internal Medicine

## 2014-12-28 ENCOUNTER — Ambulatory Visit (HOSPITAL_COMMUNITY)
Admission: RE | Admit: 2014-12-28 | Discharge: 2014-12-28 | Disposition: A | Discharge status: Home / Self Care | Payer: Medicare Other | Source: Ambulatory Visit | Attending: Internal Medicine | Admitting: Internal Medicine

## 2014-12-28 ENCOUNTER — Encounter (HOSPITAL_COMMUNITY): Admission: RE | Payer: Self-pay | Source: Ambulatory Visit

## 2014-12-28 ENCOUNTER — Encounter (HOSPITAL_COMMUNITY): Payer: Self-pay | Admitting: Anesthesiology

## 2014-12-28 ENCOUNTER — Encounter (HOSPITAL_COMMUNITY)
Admission: RE | Disposition: A | Discharge status: Home / Self Care | Payer: Self-pay | Source: Ambulatory Visit | Attending: Internal Medicine

## 2014-12-28 DIAGNOSIS — I10 Essential (primary) hypertension: Secondary | ICD-10-CM | POA: Diagnosis not present

## 2014-12-28 DIAGNOSIS — K633 Ulcer of intestine: Secondary | ICD-10-CM | POA: Diagnosis not present

## 2014-12-28 DIAGNOSIS — F319 Bipolar disorder, unspecified: Secondary | ICD-10-CM | POA: Diagnosis not present

## 2014-12-28 DIAGNOSIS — R1030 Lower abdominal pain, unspecified: Secondary | ICD-10-CM

## 2014-12-28 DIAGNOSIS — Z79899 Other long term (current) drug therapy: Secondary | ICD-10-CM | POA: Insufficient documentation

## 2014-12-28 DIAGNOSIS — D125 Benign neoplasm of sigmoid colon: Secondary | ICD-10-CM | POA: Diagnosis not present

## 2014-12-28 DIAGNOSIS — K5190 Ulcerative colitis, unspecified, without complications: Secondary | ICD-10-CM

## 2014-12-28 DIAGNOSIS — R197 Diarrhea, unspecified: Secondary | ICD-10-CM | POA: Diagnosis present

## 2014-12-28 DIAGNOSIS — O26899 Other specified pregnancy related conditions, unspecified trimester: Secondary | ICD-10-CM

## 2014-12-28 DIAGNOSIS — K529 Noninfective gastroenteritis and colitis, unspecified: Secondary | ICD-10-CM | POA: Insufficient documentation

## 2014-12-28 DIAGNOSIS — K625 Hemorrhage of anus and rectum: Secondary | ICD-10-CM | POA: Diagnosis not present

## 2014-12-28 DIAGNOSIS — R109 Unspecified abdominal pain: Secondary | ICD-10-CM

## 2014-12-28 DIAGNOSIS — F419 Anxiety disorder, unspecified: Secondary | ICD-10-CM | POA: Insufficient documentation

## 2014-12-28 HISTORY — PX: FLEXIBLE SIGMOIDOSCOPY: SHX5431

## 2014-12-28 HISTORY — PX: BIOPSY: SHX5522

## 2014-12-28 LAB — CLOSTRIDIUM DIFFICILE BY PCR: Toxigenic C. Difficile by PCR: NEGATIVE

## 2014-12-28 SURGERY — SIGMOIDOSCOPY, FLEXIBLE
Anesthesia: Monitor Anesthesia Care

## 2014-12-28 MED ORDER — ONDANSETRON HCL 4 MG/2ML IJ SOLN
INTRAMUSCULAR | Status: AC
  Filled 2014-12-28: qty 2

## 2014-12-28 MED ORDER — PROPOFOL 10 MG/ML IV BOLUS
INTRAVENOUS | Status: AC
  Filled 2014-12-28: qty 20

## 2014-12-28 MED ORDER — FENTANYL CITRATE (PF) 250 MCG/5ML IJ SOLN
INTRAMUSCULAR | Status: AC
  Filled 2014-12-28: qty 25

## 2014-12-28 MED ORDER — STERILE WATER FOR IRRIGATION IR SOLN
Status: DC | PRN
  Administered 2014-12-28: 1000 mL

## 2014-12-28 MED ORDER — ONDANSETRON HCL 4 MG/2ML IJ SOLN: 4.0000 mg | Freq: Once | INTRAMUSCULAR | Status: DC | PRN

## 2014-12-28 MED ORDER — ONDANSETRON HCL 4 MG/2ML IJ SOLN
4.0000 mg | Freq: Once | INTRAMUSCULAR | Status: AC
  Administered 2014-12-28: 4 mg via INTRAVENOUS

## 2014-12-28 MED ORDER — MIDAZOLAM HCL 2 MG/2ML IJ SOLN
INTRAMUSCULAR | Status: AC
  Filled 2014-12-28: qty 2

## 2014-12-28 MED ORDER — FENTANYL CITRATE (PF) 100 MCG/2ML IJ SOLN
25.0000 ug | INTRAMUSCULAR | Status: AC
  Administered 2014-12-28 (×2): 25 ug via INTRAVENOUS

## 2014-12-28 MED ORDER — FENTANYL CITRATE (PF) 100 MCG/2ML IJ SOLN
INTRAMUSCULAR | Status: AC
Start: 2014-12-28 — End: 2014-12-28
  Filled 2014-12-28: qty 2

## 2014-12-28 MED ORDER — MIDAZOLAM HCL 2 MG/2ML IJ SOLN
INTRAMUSCULAR | Status: AC
  Filled 2014-12-28: qty 4

## 2014-12-28 MED ORDER — FENTANYL CITRATE (PF) 100 MCG/2ML IJ SOLN
INTRAMUSCULAR | Status: AC
  Filled 2014-12-28: qty 4

## 2014-12-28 MED ORDER — MIDAZOLAM HCL 5 MG/5ML IJ SOLN
INTRAMUSCULAR | Status: DC | PRN
  Administered 2014-12-28: 2 mg via INTRAVENOUS

## 2014-12-28 MED ORDER — PROPOFOL 500 MG/50ML IV EMUL
INTRAVENOUS | Status: DC | PRN
  Administered 2014-12-28: 08:00:00 via INTRAVENOUS
  Administered 2014-12-28: 150 ug/kg/min via INTRAVENOUS

## 2014-12-28 MED ORDER — PHENYLEPHRINE HCL 10 MG/ML IJ SOLN
INTRAMUSCULAR | Status: DC | PRN
  Administered 2014-12-28: 40 ug via INTRAVENOUS
  Administered 2014-12-28: 80 ug via INTRAVENOUS

## 2014-12-28 MED ORDER — LACTATED RINGERS IV SOLN
INTRAVENOUS | Status: DC
  Administered 2014-12-28: 07:00:00 via INTRAVENOUS

## 2014-12-28 MED ORDER — LIDOCAINE HCL (PF) 1 % IJ SOLN
INTRAMUSCULAR | Status: AC
  Filled 2014-12-28: qty 5

## 2014-12-28 MED ORDER — LIDOCAINE HCL (CARDIAC) 10 MG/ML IV SOLN
INTRAVENOUS | Status: DC | PRN
  Administered 2014-12-28: 50 mg via INTRAVENOUS

## 2014-12-28 MED ORDER — FENTANYL CITRATE (PF) 100 MCG/2ML IJ SOLN: 25.0000 ug | INTRAMUSCULAR | Status: DC | PRN

## 2014-12-28 MED ORDER — MIDAZOLAM HCL 2 MG/2ML IJ SOLN
1.0000 mg | INTRAMUSCULAR | Status: DC | PRN
  Administered 2014-12-28: 2 mg via INTRAVENOUS

## 2014-12-28 SURGICAL SUPPLY — 6 items
FORMALIN 10 PREFIL 20ML (MISCELLANEOUS) ×4 IMPLANT
LUBRICANT JELLY 4.5OZ STERILE (MISCELLANEOUS) ×2 IMPLANT
SYR 50ML LL SCALE MARK (SYRINGE) ×2 IMPLANT
TRAP SPECIMEN MUCOUS 40CC (MISCELLANEOUS) ×2 IMPLANT
TUBING IRRIGATION ENDOGATOR (MISCELLANEOUS) ×2 IMPLANT
WATER STERILE IRR 1000ML POUR (IV SOLUTION) ×2 IMPLANT

## 2014-12-28 NOTE — H&P (Signed)
[name] is an 74 y.o. female.   Chief Complaint: Patient is here for flexible sigmoidoscopy. HPI: Patient is 74 year old Caucasian female who has distal ulcerative colitis was seen in the office last week with rectal bleeding multiple loose stools per day as well as lower abdominal pain. She has been on Humira for several months. She had a relapse back in July and she was treated with prednisone. She felt a lot better but not 100%. She came off prednisone about 6 weeks ago and her symptoms have relapsed. She is undergoing sigmoidoscopy with biopsy to make sure she does not have superadded CMV colitis. There is no history of recent antibody [place] travel out of the country. She complains of pain in her left hand numb and is seeing the rheumatologist. Patient underwent full colonoscopy in June 2012 under propofol. Prior attempts at colonoscopy was incomplete because of patient's inability to tolerate conscious sedation.  Past Medical History  Diagnosis Date  . Hypertension   . Ulcerative colitis   . Hyperglycemia, drug-induced 05/02/2011  . Anemia due to blood loss 05/02/2011  . Fibromyalgia   . Anxiety   . Bipolar disorder [facility])     Past Surgical History  Procedure Laterality Date  . Cholecystectomy      s/p  . Colonoscopy  06/16/06. 04/27/2007    proctitis seen, biopsies showed chronic active colitis, no dysplasia   . Abdominal hysterectomy      Family History  Problem Relation Age of Onset  . Heart attack Mother 8    Died in her sleep  . Diabetes Mother   . Heart attack Father 104    Died in her sleep  . Diabetes Father   . Heart attack Brother 56    Defib, CABG  . Diabetes Brother   . Cancer Sister    Social History:  reports that she has never smoked. She has never used smokeless tobacco. She reports that she drinks alcohol. She reports that she does not use illicit drugs.  Allergies:  Allergies  Allergen Reactions  . Azathioprine Nausea And Vomiting and Other (See  Comments)    SEVERE NAUSEA AND VOMITING WITH CHEST PAIN-  PATIENT INSISTS NEVER TO BE GIVEN ANYTHING SIMILAR TO THIS  . Tape Other (See Comments)    BLISTERS AND SKIN TEARING  . Codeine Nausea And Vomiting  . Penicillins Hives  . Povidone-Iodine Hives    BETADINE  . Procaine Hcl Other (See Comments)    SEVERE GI UPSET (NAUSEA,VOMITING AND DIARRHEA)  . Sulfonamide Derivatives Hives  . Tramadol Itching  . Azithromycin     Mycins   . Clonazepam Other (See Comments)    Skin burning, insomnia, diarrhea.  . Dairy Aid [Lactase]   . Latex Rash  . Niacin And Related Rash    Medications Prior to Admission  Medication Sig Dispense Refill  . Adalimumab (HUMIRA PEN Kismet) Inject 40 mg into the skin. Every 2 weeks    . ALPRAZolam (XANAX) 0.5 MG tablet Take 1 tablet (0.5 mg total) by mouth 2 (two) times daily as needed for anxiety. 60 tablet 0  . antiseptic oral rinse (BIOTENE) LIQD 1 application by Mouth Rinse route as needed for dry mouth. FOR DRY MOUTH    . B Complex CAPS Take 1 capsule by mouth every morning.     . benazepril-hydrochlorthiazide (LOTENSIN HCT) 20-12.5 MG per tablet Take 1 tablet by mouth daily. 30 tablet 5  . Cholecalciferol (VITAMIN D3) 1000 UNITS CAPS Take 1 capsule by  mouth. Patient states that she alternates with Hair, Skin, and Nails.    . diphenhydrAMINE (BENADRYL) 25 MG tablet Take 25 mg by mouth at bedtime as needed for sleep.    . hydrocortisone (PROCTOSOL HC) 2.5 % rectal cream Place 1 application rectally 2 (two) times daily. 30 g 0  . loperamide (IMODIUM) 2 MG capsule Take 1 capsule (2 mg total) by mouth every 8 (eight) hours as needed for diarrhea or loose stools. 30 capsule 0  . Multiple Vitamins-Minerals (WOMENS MULTI VITAMIN & MINERAL PO) Take 1 tablet by mouth every other day.     . Omega-3 Fatty Acids (FISH OIL) 1000 MG CPDR Take 1 capsule by mouth every evening.     . ondansetron (ZOFRAN) 4 MG tablet Take 1 tablet (4 mg total) by mouth 2 (two) times daily as  needed for nausea or vomiting. 20 tablet 1  . oxyCODONE (OXY IR/ROXICODONE) 5 MG immediate release tablet Take 1 tablet (5 mg total) by mouth 3 (three) times daily as needed for severe pain. 60 tablet 0  . Probiotic Product (PROBIOTIC FORMULA PO) Take 1-2 capsules by mouth every morning.     . vitamin A 10000 UNIT capsule Take 10,000 Units by mouth once a week.     . vitamin C (ASCORBIC ACID) 500 MG tablet Take 500 mg by mouth once a week.     . vitamin E 400 UNIT capsule Take 400 Units by mouth every morning.     . zolpidem (AMBIEN) 5 MG tablet Take 1 tablet (5 mg total) by mouth at bedtime as needed for sleep. 30 tablet 3  . co-enzyme Q-10 50 MG capsule Take 50 mg by mouth once a week.     . dicyclomine (BENTYL) 20 MG tablet Take 0.5 tablets (10 mg total) by mouth 2 (two) times daily before a meal. 60 tablet 5    No results found for this or any previous visit (from the past 48 hour(s)). No results found.  ROS  Blood pressure 111/64, temperature 97.8 F (36.6 C), temperature source Oral, resp. rate 28, SpO2 98 %. Physical Exam  Constitutional: She appears well-developed and well-nourished.  HENT:  Mouth/Throat: Oropharynx is clear and moist.  Eyes: Conjunctivae are normal. No scleral icterus.  Neck: No thyromegaly present.  Cardiovascular: Normal rate, regular rhythm and normal heart sounds.   No murmur heard. Respiratory: Effort normal and breath sounds normal.  GI:  Abdomen is symmetrical and soft with mild tenderness at LLQ. No organomegaly or masses.  Musculoskeletal: She exhibits no edema.  Lymphadenopathy:    She has no cervical adenopathy.  Neurological: She is alert.  Skin: Skin is warm and dry.     Assessment/Plan Distal ulcerative colitis with recurrent symptoms of active disease. Patient is undergoing diagnostic flexible sigmoidoscopy to confirm that she is indeed relapsed and also to make sure she does not have CMV colitis before change in therapy.  REHMAN,NAJEEB  U 12/28/2014, 7:19 AM

## 2014-12-28 NOTE — Transfer of Care (Signed)
Immediate Anesthesia Transfer of Care Note  Patient: [name]  Procedure(s) Performed: Procedure(s): FLEXIBLE SIGMOIDOSCOPY WITH PROPOFOL (N/A)  Patient Location: PACU  Anesthesia Type:MAC  Level of Consciousness: awake, oriented and patient cooperative  Airway & Oxygen Therapy: Patient Spontanous Breathing and Patient connected to face mask oxygen  Post-op Assessment: Report given to RN and Post -op Vital signs reviewed and stable  Post vital signs: Reviewed and stable  Last Vitals:  Filed Vitals:   12/28/14 0725  BP: 106/63  Temp:   Resp: 16    Complications: No apparent anesthesia complications

## 2014-12-28 NOTE — Anesthesia Postprocedure Evaluation (Signed)
  Anesthesia Post-op Note  Patient: [name]  Procedure(s) Performed: Procedure(s): FLEXIBLE SIGMOIDOSCOPY WITH PROPOFOL (N/A)  Patient Location: PACU  Anesthesia Type:MAC  Level of Consciousness: awake, oriented and patient cooperative  Airway and Oxygen Therapy: Patient Spontanous Breathing and Patient connected to face mask oxygen  Post-op Pain: none  Post-op Assessment: Post-op Vital signs reviewed, Patient's Cardiovascular Status Stable, Respiratory Function Stable, Patent Airway, No signs of Nausea or vomiting and Pain level controlled              Post-op Vital Signs: Reviewed and stable  Last Vitals:  Filed Vitals:   12/28/14 0725  BP: 106/63  Temp:   Resp: 16    Complications: No apparent anesthesia complications

## 2014-12-28 NOTE — Anesthesia Preprocedure Evaluation (Signed)
Anesthesia Evaluation  Patient identified by MRN, date of birth, ID band Patient awake    Reviewed: Allergy & Precautions, NPO status , Patient's Chart, lab work & pertinent test results  Airway Mallampati: II  TM Distance: >3 FB     Dental  (+) Teeth Intact, Implants   Pulmonary neg pulmonary ROS,           Cardiovascular hypertension, Pt. on medications  Rhythm:Regular Rate:Normal     Neuro/Psych PSYCHIATRIC DISORDERS Anxiety Depression Bipolar Disorder    GI/Hepatic PUD,   Endo/Other    Renal/GU      Musculoskeletal  (+) Fibromyalgia -  Abdominal   Peds  Hematology  (+) anemia ,   Anesthesia Other Findings   Reproductive/Obstetrics                             Anesthesia Physical Anesthesia Plan  ASA: III  Anesthesia Plan: MAC   Post-op Pain Management:    Induction: Intravenous  Airway Management Planned: Simple Face Mask  Additional Equipment:   Intra-op Plan:   Post-operative Plan:   Informed Consent: I have reviewed the patients History and Physical, chart, labs and discussed the procedure including the risks, benefits and alternatives for the proposed anesthesia with the patient or authorized representative who has indicated his/her understanding and acceptance.     Plan Discussed with:   Anesthesia Plan Comments:         Anesthesia Quick Evaluation

## 2014-12-28 NOTE — Anesthesia Procedure Notes (Signed)
Procedure Name: MAC Date/Time: 12/28/2014 7:29 AM Performed by: Andree Elk, AMY A Pre-anesthesia Checklist: Patient identified, Timeout performed, Emergency Drugs available, Suction available and Patient being monitored Oxygen Delivery Method: Simple face mask

## 2014-12-28 NOTE — Discharge Instructions (Signed)
Resume usual medications and diet. No driving for 24 hours. Physician will call with results of stool studies and biopsy and further recommendations.   PATIENT INSTRUCTIONS POST-ANESTHESIA  IMMEDIATELY FOLLOWING SURGERY:  Do not drive or operate machinery for the first twenty four hours after surgery.  Do not make any important decisions for twenty four hours after surgery or while taking narcotic pain medications or sedatives.  If you develop intractable nausea and vomiting or a severe headache please notify your doctor immediately.  FOLLOW-UP:  Please make an appointment with your surgeon as instructed. You do not need to follow up with anesthesia unless specifically instructed to do so.  WOUND CARE INSTRUCTIONS (if applicable):  Keep a dry clean dressing on the anesthesia/puncture wound site if there is drainage.  Once the wound has quit draining you may leave it open to air.  Generally you should leave the bandage intact for twenty four hours unless there is drainage.  If the epidural site drains for more than 36-48 hours please call the anesthesia department.  QUESTIONS?:  Please feel free to call your physician or the hospital operator if you have any questions, and they will be happy to assist you.       Flexible Sigmoidoscopy, Care After Refer to this sheet in the next few weeks. These instructions provide you with information on caring for yourself after your procedure. Your health care provider may also give you more specific instructions. Your treatment has been planned according to current medical practices, but problems sometimes occur. Call your health care provider if you have any problems or questions after your procedure. WHAT TO EXPECT AFTER THE PROCEDURE After your procedure, it is typical to have the following:   Abdominal cramps.  Bloating.  A small amount of rectal bleeding if you had a biopsy. HOME CARE INSTRUCTIONS  Only take over-the-counter or prescription  medicines for pain, fever, or discomfort as directed by your health care provider.  Resume your normal diet and activities as directed by your health care provider. SEEK MEDICAL CARE IF:  You have abdominal pain or cramping that lasts longer than 1 hour after the procedure.  You continue to have small amounts of rectal bleeding after 24 hours.  You have nausea or vomiting.  You feel weak or dizzy. SEEK IMMEDIATE MEDICAL CARE IF:   You have a fever.  You pass large blood clots or see a large amount of blood in the toilet after having a bowel movement. This may also occur 10-14 days after the procedure. It is more likely if you had a biopsy.  You develop abdominal pain that is not relieved with medicine or your abdominal pain gets worse.  You have nausea or vomiting for more than 24 hours after the procedure.   This information is not intended to replace advice given to you by your health care provider. Make sure you discuss any questions you have with your health care provider.   Document Released: 03/14/2013 Document Reviewed: 03/14/2013 Elsevier Interactive Patient Education [employer].

## 2014-12-28 NOTE — Op Note (Signed)
FLEXIBLE SIGMOIDOSCOPY  PROCEDURE REPORT  PATIENT:  [name]  MR#:  [id number] Birthdate:  03/19/1941, 74 y.o., female Endoscopist:  Dr. Rogene Houston, MD Referred By:  Dr. Rayne Du ref. provider found Procedure Date: 12/28/2014  Procedure:   Flexible sigmoidoscopy  Indications:  Patient is 74 year old Caucasian female who has ulcerative colitis and has been on Humira for maintenance. Her symptoms relapsed in July 2016 and she was treated with prednisone but on tapering prednisone her symptoms have relapsed. She has diarrhea with 6-15 small-volume loose stools daily lower abdominal pain and rectal bleeding. She has had multiple flexible sigmoidoscopies in the past revealing distal disease. She did have colonoscopy in June 2012 revealing distal disease with transition to around 30 cm. Her H&H 4 days ago was 11.7 and 36.2. She is undergoing flexible sigmoidoscopy under monitored anesthesia care because she is intolerant of conscious sedation.  Informed Consent:  The procedure and risks were reviewed with the patient and informed consent was obtained.  Medications:  Monitored anesthesia care. Please see anesthesia records for details.  Description of procedure:  After a digital rectal exam was performed, that colonoscope was advanced from the anus through the rectum and an region of splenic flexure. Scope was then gradually withdrawn and mucosal surfaces were carefully surveyed utilizing scope tip to flexion to facilitate fold flattening as needed.  Rectum was narrowed and retroflexion was not possible.  Findings:   Prep satisfactory. Diffuse involvement of rectal mucosa from rectum all the way to the splenic flexure. Mucosa was very friable with multiple ulcers. Stool sample was obtained for routine studies. About 10 mm pedunculated polyp noted at distal sigmoid colon. Multiple biopsies taken from mucosa of sigmoid colon and rectum and submitted separately.    Therapeutic/Diagnostic  Maneuvers Performed:  See above  Complications:  None  EBL: Minimal  Impression:  Diffuse colitis extending from rectum all the way to splenic flexure. 10 mm pedunculated polyp at distal sigmoid colon. Stool sample obtained for cultures O&P and C. Difficile. Biopsies taken from sigmoid colon and rectum primarily to rule out superadded CMV colitis. Rectal luminal diameter has increased and scope could not be retroflexed.  Comment: Patient's disease is not limited to rectum and distal sigmoid colon anymore. Endoscopic appearance consistent with active UC and biopsies should rule out CMV colitis.  Recommendations:  Standard instructions given. I will contact patient with biopsy results and further recommendations.  [name]  12/28/2014 8:08 AM  CC: Dr. [name], FNP & Dr. Rayne Du ref. provider found

## 2014-12-31 ENCOUNTER — Other Ambulatory Visit: Payer: Self-pay | Admitting: Nurse Practitioner

## 2014-12-31 ENCOUNTER — Encounter (HOSPITAL_COMMUNITY): Payer: Self-pay | Admitting: Internal Medicine

## 2015-01-01 ENCOUNTER — Other Ambulatory Visit: Payer: Self-pay | Admitting: *Deleted

## 2015-01-01 LAB — STOOL CULTURE

## 2015-01-01 LAB — OVA AND PARASITE EXAMINATION

## 2015-01-01 MED ORDER — ZOLPIDEM TARTRATE 5 MG PO TABS: 5.0000 mg | ORAL_TABLET | Freq: Every evening | ORAL | Status: DC | PRN

## 2015-01-01 NOTE — Telephone Encounter (Signed)
Please call in xanax with 1 refills 

## 2015-01-01 NOTE — Telephone Encounter (Signed)
Last seen 05/2014, last filled 11/26/14

## 2015-01-01 NOTE — Telephone Encounter (Signed)
Last filled 11/28/14, last seen 06/08/14. Call in at [facility]

## 2015-01-01 NOTE — Telephone Encounter (Signed)
Please call in [place] with 0 refills no more refills without being seen

## 2015-01-02 ENCOUNTER — Other Ambulatory Visit: Payer: Self-pay | Admitting: Nurse Practitioner

## 2015-01-02 ENCOUNTER — Telehealth (INDEPENDENT_AMBULATORY_CARE_PROVIDER_SITE_OTHER): Payer: Self-pay | Admitting: *Deleted

## 2015-01-02 ENCOUNTER — Telehealth: Payer: Self-pay | Admitting: Cardiology

## 2015-01-02 DIAGNOSIS — M79602 Pain in left arm: Secondary | ICD-10-CM

## 2015-01-02 DIAGNOSIS — Z8249 Family history of ischemic heart disease and other diseases of the circulatory system: Secondary | ICD-10-CM

## 2015-01-02 NOTE — Telephone Encounter (Signed)
New Message       Pt calling stating that Dr. Percival Spanish wanted her to have a stress test and there is no order in Epic and pt wants to have it done at [facility]. Please call back and advise.

## 2015-01-02 NOTE — Telephone Encounter (Signed)
Last seen 09/15/14  DWM If approved route to nurse to call into [facility]  [phone]

## 2015-01-02 NOTE — Telephone Encounter (Signed)
lmtcb needs to be seen w/n the month, refill was called to [facility].

## 2015-01-02 NOTE — Telephone Encounter (Signed)
Please review

## 2015-01-02 NOTE — Telephone Encounter (Signed)
Appt made for 3wks

## 2015-01-02 NOTE — Telephone Encounter (Signed)
Exercise tolerance test ordered per MD note to be done at [facility]

## 2015-01-02 NOTE — Telephone Encounter (Signed)
This lab is a special lab and will need to be ordered for [facility]. Paper work has been completed and awaiting Dr.Rehman' signature.

## 2015-01-03 ENCOUNTER — Telehealth (INDEPENDENT_AMBULATORY_CARE_PROVIDER_SITE_OTHER): Payer: Self-pay | Admitting: *Deleted

## 2015-01-03 NOTE — Telephone Encounter (Signed)
Please call in ambien with 1 refills 

## 2015-01-03 NOTE — Telephone Encounter (Signed)
Refill called to [facility]

## 2015-01-03 NOTE — Telephone Encounter (Signed)
Prednisone 10 mg tablets - Take 1 by mouth daily. #60 Called to [facility]. Patient was called and made aware.

## 2015-01-04 ENCOUNTER — Other Ambulatory Visit (INDEPENDENT_AMBULATORY_CARE_PROVIDER_SITE_OTHER): Payer: Medicare Other

## 2015-01-04 DIAGNOSIS — Z23 Encounter for immunization: Secondary | ICD-10-CM

## 2015-01-08 ENCOUNTER — Telehealth: Payer: Self-pay | Admitting: Nurse Practitioner

## 2015-01-10 ENCOUNTER — Telehealth: Payer: Self-pay | Admitting: Nurse Practitioner

## 2015-01-10 NOTE — Telephone Encounter (Signed)
I don't know who pt needs to get in contact with at [facility] to schedule stress test. Please call back and advise.

## 2015-01-10 NOTE — Telephone Encounter (Signed)
I sent a staff message to [name] at [facility] to see if she schedules the GXT at [facility].  Waiting for an answer.

## 2015-01-10 NOTE — Telephone Encounter (Signed)
Patient is calling requesting results from 10/14 for Dr. Dereck Leep can you please review.

## 2015-01-11 NOTE — Telephone Encounter (Signed)
GXT scheduled 01/16/15

## 2015-01-16 ENCOUNTER — Encounter (HOSPITAL_COMMUNITY): Payer: Medicare Other

## 2015-01-16 ENCOUNTER — Telehealth: Payer: Self-pay | Admitting: Nurse Practitioner

## 2015-01-16 DIAGNOSIS — R1030 Lower abdominal pain, unspecified: Secondary | ICD-10-CM

## 2015-01-16 MED ORDER — OXYCODONE HCL 5 MG PO TABS: 5.0000 mg | ORAL_TABLET | Freq: Three times a day (TID) | ORAL | Status: DC | PRN

## 2015-01-16 NOTE — Telephone Encounter (Signed)
Last filled 12/18/14, last seen 09/15/14. Rx will print

## 2015-01-16 NOTE — Telephone Encounter (Signed)
This is okay to refill 1 

## 2015-01-16 NOTE — Telephone Encounter (Signed)
Patient aware.

## 2015-01-16 NOTE — Telephone Encounter (Signed)
Pt states she had labs drawn here on 10/14, don't see it. Please advise.

## 2015-01-21 ENCOUNTER — Ambulatory Visit: Payer: Medicare Other | Admitting: Nurse Practitioner

## 2015-01-21 ENCOUNTER — Ambulatory Visit (INDEPENDENT_AMBULATORY_CARE_PROVIDER_SITE_OTHER): Payer: Medicare Other | Admitting: Internal Medicine

## 2015-01-21 ENCOUNTER — Encounter (INDEPENDENT_AMBULATORY_CARE_PROVIDER_SITE_OTHER): Payer: Self-pay | Admitting: Internal Medicine

## 2015-01-21 ENCOUNTER — Telehealth (INDEPENDENT_AMBULATORY_CARE_PROVIDER_SITE_OTHER): Payer: Self-pay | Admitting: *Deleted

## 2015-01-21 VITALS — BP 124/72 | HR 78 | Temp 98.1°F | Resp 18 | Ht 68.5 in | Wt 150.3 lb

## 2015-01-21 DIAGNOSIS — K51919 Ulcerative colitis, unspecified with unspecified complications: Secondary | ICD-10-CM | POA: Diagnosis not present

## 2015-01-21 MED ORDER — METHOTREXATE (PF) 15 MG/0.3ML ~~LOC~~ SOAJ: 15.0000 mg | SUBCUTANEOUS | Status: DC

## 2015-01-21 MED ORDER — MESALAMINE 1.2 G PO TBEC: 2.4000 g | DELAYED_RELEASE_TABLET | Freq: Every day | ORAL | Status: DC

## 2015-01-21 NOTE — Telephone Encounter (Signed)
Pharmacy called the injections are not covered. Per NUR change to methatrexate 15 mg 1 by mouth every seven days. 3 months supply. This was called to [facility] at [facility].

## 2015-01-21 NOTE — Patient Instructions (Signed)
Blood work to be done in 4 weeks. If you experience no side effects with methotrexate start tapering prednisone for 2 weeks.

## 2015-01-21 NOTE — Progress Notes (Signed)
Presenting complaint;  Follow-up for ulcerative colitis. Patient is here to review treatment options.  Database:  Patient is 74 year old Caucasian female with known chronic ulcerative colitis who underwent flexible sigmoidoscopy on 12/28/2014 with persistent diarrhea abdominal pain and rectal bleeding. She was noted to have very active disease to distal transverse colon. On prior exam she was well documented to have distal disease only. Biopsies revealed active ulcerative colitis without CMV. She was asked to go back on prednisone.  Subjective:  Patient is here for scheduled visit accompanied by 70 for nieces [place] and [place]. She feels 75% better. Stool frequency has decreased from over 12-15 stools to 4-6 stools per day. Consistency is formed to semi-formed. She noted blood with her bowel movements only on one occasion last week. Abdominal pain has decreased. She has lost another 2 pounds. She says her appetite is improved. She is not interested in surgery which would be curative. She stated she would only consider surgery if it's a matter of life and death.   Current Medications: Outpatient Encounter Prescriptions as of 01/21/2015  Medication Sig  . Adalimumab (HUMIRA PEN [place]) Inject 40 mg into the skin. Every 2 weeks  . ALPRAZolam (XANAX) 0.5 MG tablet TAKE 1 TABLET TWICE DAILY AS NEEDED FOR ANXIETY.  Marland Kitchen antiseptic oral rinse (BIOTENE) LIQD 1 application by Mouth Rinse route as needed for dry mouth. FOR DRY MOUTH  . B Complex CAPS Take 1 capsule by mouth every morning.   . benazepril-hydrochlorthiazide (LOTENSIN HCT) 20-12.5 MG per tablet Take 1 tablet by mouth daily.  . Cholecalciferol (VITAMIN D3) 1000 UNITS CAPS Take 1 capsule by mouth. Patient states that she alternates with Hair, Skin, and Nails.  Marland Kitchen co-enzyme Q-10 50 MG capsule Take 50 mg by mouth once a week.   . diphenhydrAMINE (BENADRYL) 25 MG tablet Take 25 mg by mouth at bedtime as needed for sleep.  . hydrocortisone (PROCTOSOL  HC) 2.5 % rectal cream Place 1 application rectally 2 (two) times daily.  Marland Kitchen loperamide (IMODIUM) 2 MG capsule Take 1 capsule (2 mg total) by mouth every 8 (eight) hours as needed for diarrhea or loose stools.  . Multiple Vitamins-Minerals (WOMENS MULTI VITAMIN & MINERAL PO) Take 1 tablet by mouth every other day.   . Omega-3 Fatty Acids (FISH OIL) 1000 MG CPDR Take 1 capsule by mouth every evening.   . ondansetron (ZOFRAN) 4 MG tablet Take 1 tablet (4 mg total) by mouth 2 (two) times daily as needed for nausea or vomiting.  Marland Kitchen oxyCODONE (OXY IR/ROXICODONE) 5 MG immediate release tablet Take 1 tablet (5 mg total) by mouth 3 (three) times daily as needed for severe pain.  . predniSONE (DELTASONE) 10 MG tablet Take 10 mg by mouth daily with breakfast.   . Probiotic Product (PROBIOTIC FORMULA PO) Take 1-2 capsules by mouth every morning.   . vitamin A 10000 UNIT capsule Take 10,000 Units by mouth once a week.   . vitamin C (ASCORBIC ACID) 500 MG tablet Take 500 mg by mouth once a week.   . vitamin E 400 UNIT capsule Take 400 Units by mouth every morning.   . zolpidem (AMBIEN) 5 MG tablet TAKE 1 TABLET AT BEDTIME AS NEEDED.  Marland Kitchen dicyclomine (BENTYL) 20 MG tablet Take 0.5 tablets (10 mg total) by mouth 2 (two) times daily before a meal. (Patient not taking: Reported on 01/21/2015)   No facility-administered encounter medications on file as of 01/21/2015.     Objective: Blood pressure 124/72, pulse 78, temperature  98.1 F (36.7 C), temperature source Oral, resp. rate 18, height 5' 8.5" (1.74 m), weight 150 lb 4.8 oz (68.176 kg). Patient is alert and in no acute distress. Conjunctiva is pink. Sclera is nonicteric Oropharyngeal mucosa is normal. No neck masses or thyromegaly noted. Cardiac exam with regular rhythm normal S1 and S2. No murmur or gallop noted. Lungs are clear to auscultation. Abdomen is symmetrical and soft with mild tenderness at LLQ. No organomegaly or masses.  No LE edema or  clubbing noted.  Labs/studies Results:  Serum adalimumab concentration 5.4 g/mL. Antibodies to adalimumab concentration 2.7 units per mL  Assessment:  #1. Ulcerative colitis. She had flexible sigmoidoscopy week before loss and noted to have active disease to distal transverse colon. Full colonoscopy was not performed but based on last CT she appears to have pancolonic disease. She used to have distal disease until now. No evidence of CMV colitis. She is intolerant of 6-MP and did not have satisfactory results with Remicade and now not responded to Humira and she has developed antibodies. Given patient's course over the last 7 years surgery would be very reasonable option and would be curative but she is not interested. Options reviewed with patient's which include methotrexate and other Biologics.    Plan:  Resume Lialda 2.4 g by mouth twice a day. Discontinue Humira. Begin methotrexate 15 mg IM every week. If she is able to tolerate methotrexate she will begin prednisone taper after 2 weeks. She will drop the dose by 5 mg every week. CBC metabolic 7 and LFTs in 4 weeks. Office visit in 8 weeks   Addendum: She will take methotrexate by mouth since parenteral preparation is not covered by her plan.

## 2015-01-22 ENCOUNTER — Encounter (INDEPENDENT_AMBULATORY_CARE_PROVIDER_SITE_OTHER): Payer: Self-pay

## 2015-01-22 ENCOUNTER — Ambulatory Visit (HOSPITAL_COMMUNITY): Admission: RE | Admit: 2015-01-22 | Payer: Medicare Other | Source: Ambulatory Visit

## 2015-01-24 ENCOUNTER — Encounter: Payer: Self-pay | Admitting: Nurse Practitioner

## 2015-01-24 ENCOUNTER — Ambulatory Visit (INDEPENDENT_AMBULATORY_CARE_PROVIDER_SITE_OTHER): Payer: Medicare Other | Admitting: Nurse Practitioner

## 2015-01-24 VITALS — BP 133/77 | HR 82 | Temp 97.2°F | Ht 68.0 in | Wt 151.0 lb

## 2015-01-24 DIAGNOSIS — E8881 Metabolic syndrome: Secondary | ICD-10-CM

## 2015-01-24 DIAGNOSIS — R110 Nausea: Secondary | ICD-10-CM | POA: Diagnosis not present

## 2015-01-24 DIAGNOSIS — M797 Fibromyalgia: Secondary | ICD-10-CM

## 2015-01-24 DIAGNOSIS — R739 Hyperglycemia, unspecified: Secondary | ICD-10-CM

## 2015-01-24 DIAGNOSIS — E1169 Type 2 diabetes mellitus with other specified complication: Secondary | ICD-10-CM | POA: Insufficient documentation

## 2015-01-24 DIAGNOSIS — F32A Depression, unspecified: Secondary | ICD-10-CM

## 2015-01-24 DIAGNOSIS — I10 Essential (primary) hypertension: Secondary | ICD-10-CM | POA: Diagnosis not present

## 2015-01-24 DIAGNOSIS — E785 Hyperlipidemia, unspecified: Secondary | ICD-10-CM | POA: Diagnosis not present

## 2015-01-24 DIAGNOSIS — R7309 Other abnormal glucose: Secondary | ICD-10-CM

## 2015-01-24 DIAGNOSIS — E876 Hypokalemia: Secondary | ICD-10-CM | POA: Diagnosis not present

## 2015-01-24 DIAGNOSIS — F329 Major depressive disorder, single episode, unspecified: Secondary | ICD-10-CM

## 2015-01-24 DIAGNOSIS — G4700 Insomnia, unspecified: Secondary | ICD-10-CM

## 2015-01-24 DIAGNOSIS — K5150 Left sided colitis without complications: Secondary | ICD-10-CM | POA: Diagnosis not present

## 2015-01-24 LAB — POCT GLYCOSYLATED HEMOGLOBIN (HGB A1C): HEMOGLOBIN A1C: 6.6

## 2015-01-24 MED ORDER — ONDANSETRON HCL 4 MG PO TABS: 4.0000 mg | ORAL_TABLET | Freq: Two times a day (BID) | ORAL | Status: DC | PRN

## 2015-01-24 MED ORDER — ALPRAZOLAM 0.5 MG PO TABS: ORAL_TABLET | ORAL | Status: DC

## 2015-01-24 MED ORDER — BENAZEPRIL-HYDROCHLOROTHIAZIDE 20-12.5 MG PO TABS: 1.0000 | ORAL_TABLET | Freq: Every day | ORAL | Status: DC

## 2015-01-24 MED ORDER — ZOLPIDEM TARTRATE 5 MG PO TABS: ORAL_TABLET | ORAL | Status: DC

## 2015-01-24 NOTE — Patient Instructions (Signed)
Bone Health Bones protect organs, store calcium, and anchor muscles. Good health habits, such as eating nutritious foods and exercising regularly, are important for maintaining healthy bones. They can also help to prevent a condition that causes bones to lose density and become weak and brittle (osteoporosis). WHY IS BONE MASS IMPORTANT? Bone mass refers to the amount of bone tissue that you have. The higher your bone mass, the stronger your bones. An important step toward having healthy bones throughout life is to have strong and dense bones during childhood. A young adult who has a high bone mass is more likely to have a high bone mass later in life. Bone mass at its greatest it is called peak bone mass. A large decline in bone mass occurs in older adults. In women, it occurs about the time of menopause. During this time, it is important to practice good health habits, because if more bone is lost than what is replaced, the bones will become less healthy and more likely to break (fracture). If you find that you have a low bone mass, you may be able to prevent osteoporosis or further bone loss by changing your diet and lifestyle. HOW CAN I FIND OUT IF MY BONE MASS IS LOW? Bone mass can be measured with an X-ray test that is called a bone mineral density (BMD) test. This test is recommended for all women who are age 23 or older. It may also be recommended for men who are age 602 or older, or for people who are more likely to develop osteoporosis due to:  Having bones that break easily.  Having a long-term disease that weakens bones, such as kidney disease or rheumatoid arthritis.  Having menopause earlier than normal.  Taking medicine that weakens bones, such as steroids, thyroid hormones, or hormone treatment for breast cancer or prostate cancer.  Smoking.  Drinking three or more alcoholic drinks each day. WHAT ARE THE NUTRITIONAL RECOMMENDATIONS FOR HEALTHY BONES? To have healthy bones, you need  to get enough of the right minerals and vitamins. Most nutrition experts recommend getting these nutrients from the foods that you eat. Nutritional recommendations vary from person to person. Ask your health care provider what is healthy for you. Here are some general guidelines. Calcium Recommendations Calcium is the most important (essential) mineral for bone health. Most people can get enough calcium from their diet, but supplements may be recommended for people who are at risk for osteoporosis. Good sources of calcium include:  Dairy products, such as low-fat or nonfat milk, cheese, and yogurt.  Dark green leafy vegetables, such as bok choy and broccoli.  Calcium-fortified foods, such as orange juice, cereal, bread, soy beverages, and tofu products.  Nuts, such as almonds. Follow these recommended amounts for daily calcium intake:  Children, age 60-3: 700 mg.  Children, age 43-8: 1,000 mg.  Children, age 435-13: 1,300 mg.  Teens, age 65-18: 1,300 mg.  Adults, age 54-50: 1,000 mg.  Adults, age 70-70:  Men: 1,000 mg.  Women: 1,200 mg.  Adults, age 606 or older: 1,200 mg.  Pregnant and breastfeeding females:  Teens: 1,300 mg.  Adults: 1,000 mg. Vitamin D Recommendations Vitamin D is the most essential vitamin for bone health. It helps the body to absorb calcium. Sunlight stimulates the skin to make vitamin D, so be sure to get enough sunlight. If you live in a cold climate or you do not get outside often, your health care provider may recommend that you take vitamin D supplements. Good  sources of vitamin D in your diet include:  Egg yolks.  Saltwater fish.  Milk and cereal fortified with vitamin D. Follow these recommended amounts for daily vitamin D intake:  Children and teens, age 10-18: 600 international units.  Adults, age 43 or younger: 400-800 international units.  Adults, age 100 or older: 800-1,000 international units. Other Nutrients Other nutrients for bone  health include:  Phosphorus. This mineral is found in meat, poultry, dairy foods, nuts, and legumes. The recommended daily intake for adult men and adult women is 700 mg.  Magnesium. This mineral is found in seeds, nuts, dark green vegetables, and legumes. The recommended daily intake for adult men is 400-420 mg. For adult women, it is 310-320 mg.  Vitamin K. This vitamin is found in green leafy vegetables. The recommended daily intake is 120 mg for adult men and 90 mg for adult women. WHAT TYPE OF PHYSICAL ACTIVITY IS BEST FOR BUILDING AND MAINTAINING HEALTHY BONES? Weight-bearing and strength-building activities are important for building and maintaining peak bone mass. Weight-bearing activities cause muscles and bones to work against gravity. Strength-building activities increases muscle strength that supports bones. Weight-bearing and muscle-building activities include:  Walking and hiking.  Jogging and running.  Dancing.  Gym exercises.  Lifting weights.  Tennis and racquetball.  Climbing stairs.  Aerobics. Adults should get at least 30 minutes of moderate physical activity on most days. Children should get at least 60 minutes of moderate physical activity on most days. Ask your health care provide what type of exercise is best for you. WHERE CAN I FIND MORE INFORMATION? For more information, check out the following websites:  [place]: YardH[employer].se  [employer] of Health: http://www.niams.AnonymousEar.fr.asp   This information is not intended to replace advice given to you by your health care provider. Make sure you discuss any questions you have with your health care provider.   Document Released: 05/30/2003 Document Revised: 07/24/2014 Document Reviewed: 03/14/2014 Elsevier Interactive Patient Education [employer].

## 2015-01-24 NOTE — Progress Notes (Signed)
Subjective:    Patient ID: [name], female    DOB: [birth year], 74 y.o.   MRN: [id number]  Patient here today for follow up of chronic medical problems. No complaints today  Hypertension This is a chronic problem. The current episode started more than 1 year ago. The problem is unchanged. The problem is controlled. Pertinent negatives include no chest pain, headaches, neck pain, palpitations or shortness of breath. Risk factors for coronary artery disease include dyslipidemia, post-menopausal state and sedentary lifestyle. Past treatments include ACE inhibitors and diuretics. The current treatment provides moderate improvement. Compliance problems include diet and exercise.  There is no history of CAD/MI, CVA, heart failure or PVD.  Hyperlipidemia This is a chronic problem. The current episode started more than 1 year ago. The problem is controlled. She has no history of diabetes, hypothyroidism or obesity. Pertinent negatives include no chest pain or shortness of breath. She is currently on no antihyperlipidemic treatment. The current treatment provides moderate improvement of lipids. Compliance problems include adherence to diet and adherence to exercise.  Risk factors for coronary artery disease include dyslipidemia, hypertension and post-menopausal.  Diabetes She presents for her follow-up diabetic visit. She has type 2 diabetes mellitus. Pertinent negatives for hypoglycemia include no headaches. Pertinent negatives for diabetes include no chest pain. Symptoms are stable. Pertinent negatives for diabetic complications include no CVA or PVD. Her weight is stable. When asked about meal planning, she reported none. She has not had a previous visit with a dietitian. She rarely participates in exercise. Home blood sugar record trend: only checks occassionally. Her breakfast blood glucose range is generally 130-140 mg/dl. Her overall blood glucose range is 130-140 mg/dl. An ACE inhibitor/angiotensin  II receptor blocker is being taken. She does not see a podiatrist.Eye exam is not current.  ulcerative colitis Dr. [name]- curremtly coming off of  prednisone- Was on Humira which was not working so they have put her on methotreazate yesterday. Drepression/GAD lexapor- working well to keep her from worrying so much. Takes xanax as needed at least BID- insurance no longer wants to pay for xanax so need to change. insomnia ambien but only takes occasionally- uses benadryl most of the time.   Review of Systems  HENT: Negative.   Respiratory: Negative for shortness of breath.   Cardiovascular: Negative for chest pain and palpitations.  Gastrointestinal: Positive for nausea, diarrhea and constipation.  Musculoskeletal: Negative for neck pain.  Neurological: Negative for headaches.  Psychiatric/Behavioral: Negative.   All other systems reviewed and are negative.      Objective:   Physical Exam  Constitutional: She is oriented to person, place, and time. She appears well-developed and well-nourished.  HENT:  Nose: Nose normal.  Mouth/Throat: Oropharynx is clear and moist.  Eyes: EOM are normal.  Neck: Trachea normal, normal range of motion and full passive range of motion without pain. Neck supple. No JVD present. Carotid bruit is not present. No thyromegaly present.  Cardiovascular: Normal rate, regular rhythm, normal heart sounds and intact distal pulses.  Exam reveals no gallop and no friction rub.   No murmur heard. Pulmonary/Chest: Effort normal and breath sounds normal.  Abdominal: Soft. Bowel sounds are normal. She exhibits no distension and no mass. There is tenderness (diffuse).  Musculoskeletal: Normal range of motion.  Lymphadenopathy:    She has no cervical adenopathy.  Neurological: She is alert and oriented to person, place, and time. She has normal reflexes.  Skin: Skin is warm and dry.  Psychiatric: She has a  normal mood and affect. Her behavior is normal. Judgment and  thought content normal.   BP 133/77 mmHg  Pulse 82  Temp(Src) 97.2 F (36.2 C) (Oral)  Ht _0  (1.727 m)  Wt 151 lb (68.493 kg)  BMI 22.96 kg/m2  Results for orders placed or performed in visit on 01/24/15  POCT glycosylated hemoglobin (Hb A1C)  Result Value Ref Range   Hemoglobin A1C 6.6        Assessment & Plan:  1. High blood sugar Watch carbs in diet  2. Essential hypertension Do  Not add slat o diet - CMP14+EGFR - Lipid panel - benazepril-hydrochlorthiazide (LOTENSIN HCT) 20-12.5 MG tablet; Take 1 tablet by mouth daily.  Dispense: 30 tablet; Refill: 5  3. Hyperlipidemia with target LDL less than 100 Low fat diet  4. Metabolic syndrome Again watch carbs in diet - POCT glycosylated hemoglobin (Hb A1C)  5. Hypokalemia  6. Fibromyalgia Exercise will help with pain  7. Depression Stress management - ALPRAZolam (XANAX) 0.5 MG tablet; TAKE 1 TABLET TWICE DAILY AS NEEDED FOR ANXIETY.  Dispense: 60 tablet; Refill: 2  8. Insomnia Bedtime ritual - zolpidem (AMBIEN) 5 MG tablet; TAKE 1 TABLET AT BEDTIME AS NEEDED.  Dispense: 30 tablet; Refill: 2  9. ULCERATIVE COLITIS, LEFT SIDED Keep follow up appointments with dr. [name]  10. Nausea without vomiting - ondansetron (ZOFRAN) 4 MG tablet; Take 1 tablet (4 mg total) by mouth 2 (two) times daily as needed for nausea or vomiting.  Dispense: 20 tablet; Refill: 1   Pt to make mammogram appointment Labs pending Health maintenance reviewed Diet and exercise encouraged Continue all meds Follow up  In 3 month   [place], FNP

## 2015-01-25 ENCOUNTER — Encounter (INDEPENDENT_AMBULATORY_CARE_PROVIDER_SITE_OTHER): Payer: Self-pay | Admitting: *Deleted

## 2015-01-25 ENCOUNTER — Telehealth (INDEPENDENT_AMBULATORY_CARE_PROVIDER_SITE_OTHER): Payer: Self-pay | Admitting: *Deleted

## 2015-01-25 DIAGNOSIS — K51218 Ulcerative (chronic) proctitis with other complication: Secondary | ICD-10-CM

## 2015-01-25 LAB — LIPID PANEL
CHOLESTEROL TOTAL: 197 mg/dL (ref 100–199)
Chol/HDL Ratio: 3.3 ratio units (ref 0.0–4.4)
HDL: 60 mg/dL (ref >39)
LDL Calculated: 95 mg/dL (ref 0–99)
Triglycerides: 208 mg/dL — ABNORMAL HIGH (ref 0–149)
VLDL CHOLESTEROL CAL: 42 mg/dL — AB (ref 5–40)

## 2015-01-25 LAB — CMP14+EGFR
A/G RATIO: 1.5 (ref 1.1–2.5)
ALK PHOS: 64 IU/L (ref 39–117)
ALT: 10 IU/L (ref 0–32)
AST: 16 IU/L (ref 0–40)
Albumin: 3.8 g/dL (ref 3.5–4.8)
BILIRUBIN TOTAL: 0.7 mg/dL (ref 0.0–1.2)
BUN / CREAT RATIO: 17 (ref 11–26)
BUN: 22 mg/dL (ref 8–27)
CHLORIDE: 100 mmol/L (ref 97–106)
CO2: 25 mmol/L (ref 18–29)
Calcium: 9.1 mg/dL (ref 8.7–10.3)
Creatinine, Ser: 1.29 mg/dL — ABNORMAL HIGH (ref 0.57–1.00)
GFR calc Af Amer: 47 mL/min/{1.73_m2} — ABNORMAL LOW (ref >59)
GFR calc non Af Amer: 41 mL/min/{1.73_m2} — ABNORMAL LOW (ref >59)
GLUCOSE: 120 mg/dL — AB (ref 65–99)
Globulin, Total: 2.5 g/dL (ref 1.5–4.5)
POTASSIUM: 5.1 mmol/L (ref 3.5–5.2)
Sodium: 138 mmol/L (ref 136–144)
Total Protein: 6.3 g/dL (ref 6.0–8.5)

## 2015-01-25 NOTE — Telephone Encounter (Signed)
Patient started medication on 01/23/15. Labs are due 4 weeks after per NUR. Patient will have them drawn at [place] office.

## 2015-01-29 DIAGNOSIS — Z23 Encounter for immunization: Secondary | ICD-10-CM | POA: Diagnosis not present

## 2015-01-29 NOTE — Addendum Note (Signed)
Addended by: Rolena Infante on: 01/29/2015 08:19 AM   Modules accepted: Orders

## 2015-02-15 ENCOUNTER — Telehealth: Payer: Self-pay | Admitting: Nurse Practitioner

## 2015-02-15 DIAGNOSIS — R1030 Lower abdominal pain, unspecified: Secondary | ICD-10-CM

## 2015-02-15 MED ORDER — OXYCODONE HCL 5 MG PO TABS: 5.0000 mg | ORAL_TABLET | Freq: Three times a day (TID) | ORAL | Status: DC | PRN

## 2015-02-15 NOTE — Telephone Encounter (Signed)
New office policy that cannot be on pain meds and xanax at same time- will need to come off one of those so patient needs to decide which one she woud like to stop taking. Oxycodone rx ready for pick up

## 2015-02-18 NOTE — Telephone Encounter (Signed)
Patient notified that rx up front and ready for pick up

## 2015-02-20 ENCOUNTER — Other Ambulatory Visit: Payer: Medicare Other

## 2015-02-20 DIAGNOSIS — R739 Hyperglycemia, unspecified: Secondary | ICD-10-CM

## 2015-02-20 DIAGNOSIS — R5382 Chronic fatigue, unspecified: Secondary | ICD-10-CM

## 2015-02-20 DIAGNOSIS — E8881 Metabolic syndrome: Secondary | ICD-10-CM

## 2015-02-20 DIAGNOSIS — I10 Essential (primary) hypertension: Secondary | ICD-10-CM

## 2015-02-21 LAB — CBC WITH DIFFERENTIAL/PLATELET
BASOS: 0 %
Basophils Absolute: 0 10*3/uL (ref 0.0–0.2)
EOS (ABSOLUTE): 0 10*3/uL (ref 0.0–0.4)
EOS: 0 %
HEMATOCRIT: 37.2 % (ref 34.0–46.6)
Hemoglobin: 12.6 g/dL (ref 11.1–15.9)
IMMATURE GRANS (ABS): 0 10*3/uL (ref 0.0–0.1)
IMMATURE GRANULOCYTES: 0 %
Lymphocytes Absolute: 1.1 10*3/uL (ref 0.7–3.1)
Lymphs: 10 %
MCH: 29 pg (ref 26.6–33.0)
MCHC: 33.9 g/dL (ref 31.5–35.7)
MCV: 86 fL (ref 79–97)
MONOS ABS: 0.2 10*3/uL (ref 0.1–0.9)
Monocytes: 2 %
Neutrophils Absolute: 9.7 10*3/uL — ABNORMAL HIGH (ref 1.4–7.0)
Neutrophils: 88 %
Platelets: 397 10*3/uL — ABNORMAL HIGH (ref 150–379)
RBC: 4.35 x10E6/uL (ref 3.77–5.28)
RDW: 15.1 % (ref 12.3–15.4)
WBC: 11.1 10*3/uL — ABNORMAL HIGH (ref 3.4–10.8)

## 2015-02-21 LAB — BMP8+EGFR
BUN/Creatinine Ratio: 19 (ref 11–26)
BUN: 28 mg/dL — AB (ref 8–27)
CALCIUM: 9.7 mg/dL (ref 8.7–10.3)
CHLORIDE: 99 mmol/L (ref 97–106)
CO2: 23 mmol/L (ref 18–29)
Creatinine, Ser: 1.44 mg/dL — ABNORMAL HIGH (ref 0.57–1.00)
GFR, EST AFRICAN AMERICAN: 42 mL/min/{1.73_m2} — AB (ref >59)
GFR, EST NON AFRICAN AMERICAN: 36 mL/min/{1.73_m2} — AB (ref >59)
Glucose: 163 mg/dL — ABNORMAL HIGH (ref 65–99)
POTASSIUM: 5 mmol/L (ref 3.5–5.2)
Sodium: 137 mmol/L (ref 136–144)

## 2015-02-21 LAB — HEPATIC FUNCTION PANEL
ALBUMIN: 3.9 g/dL (ref 3.5–4.8)
ALT: 12 IU/L (ref 0–32)
AST: 15 IU/L (ref 0–40)
Alkaline Phosphatase: 64 IU/L (ref 39–117)
BILIRUBIN TOTAL: 0.6 mg/dL (ref 0.0–1.2)
BILIRUBIN, DIRECT: 0.14 mg/dL (ref 0.00–0.40)
Total Protein: 6 g/dL (ref 6.0–8.5)

## 2015-02-25 ENCOUNTER — Telehealth (INDEPENDENT_AMBULATORY_CARE_PROVIDER_SITE_OTHER): Payer: Self-pay | Admitting: *Deleted

## 2015-02-25 DIAGNOSIS — K51918 Ulcerative colitis, unspecified with other complication: Secondary | ICD-10-CM

## 2015-02-25 NOTE — Addendum Note (Signed)
Addended by: Grayland Ormond on: 02/25/2015 11:03 AM   Modules accepted: Orders

## 2015-02-25 NOTE — Telephone Encounter (Signed)
Per Dr.Rehman the patient will need to have labs drawn prior to her OV 01/ 05/2015.

## 2015-02-28 ENCOUNTER — Other Ambulatory Visit (INDEPENDENT_AMBULATORY_CARE_PROVIDER_SITE_OTHER): Payer: Self-pay | Admitting: Internal Medicine

## 2015-03-05 ENCOUNTER — Other Ambulatory Visit (INDEPENDENT_AMBULATORY_CARE_PROVIDER_SITE_OTHER): Payer: Self-pay | Admitting: Internal Medicine

## 2015-03-05 ENCOUNTER — Telehealth (INDEPENDENT_AMBULATORY_CARE_PROVIDER_SITE_OTHER): Payer: Self-pay | Admitting: *Deleted

## 2015-03-05 NOTE — Telephone Encounter (Signed)
Forwarded to [place].

## 2015-03-05 NOTE — Telephone Encounter (Addendum)
She feels her medication is causing her stomach to hurt, Amitza, she is having  pain in her stomach and some nausea but no vomiting .  She is to start Methitraxate tomorrow and been taking Prednisone and eating and drinking plenty of water as told.  Also Dr. [name] had done labs on her 2 weeks ago or so and was told she needed more.  Has an appt. 03/26/2015 here and wanted to get before   Please call her back [phone]

## 2015-03-05 NOTE — Telephone Encounter (Signed)
Patient patient has multiple complaints including headache dizziness abdominal pain but she states she is having less diarrhea. She also has noted facial rash. She denies fever. She does not believe she has viral illness. Patient believes she may be having side effects with Lialda. Patient advised to discontinue Lialda. She will call with progress report in 48 hours. If she is not feeling better will plan to see her in the office.

## 2015-03-06 NOTE — Telephone Encounter (Signed)
Addressed yesterday. Patient advised to stop Lialda

## 2015-03-11 ENCOUNTER — Other Ambulatory Visit (INDEPENDENT_AMBULATORY_CARE_PROVIDER_SITE_OTHER): Payer: Self-pay | Admitting: *Deleted

## 2015-03-11 ENCOUNTER — Encounter (INDEPENDENT_AMBULATORY_CARE_PROVIDER_SITE_OTHER): Payer: Self-pay | Admitting: *Deleted

## 2015-03-11 ENCOUNTER — Telehealth (INDEPENDENT_AMBULATORY_CARE_PROVIDER_SITE_OTHER): Payer: Self-pay | Admitting: *Deleted

## 2015-03-11 DIAGNOSIS — K51918 Ulcerative colitis, unspecified with other complication: Secondary | ICD-10-CM

## 2015-03-11 NOTE — Telephone Encounter (Signed)
Will fax the order to [place].

## 2015-03-11 NOTE — Telephone Encounter (Signed)
Seeing Dr Dereck Leep 03/26/15, he wants her to have blood work before then, will you send order to [facility] so she can get it there

## 2015-03-20 ENCOUNTER — Telehealth (INDEPENDENT_AMBULATORY_CARE_PROVIDER_SITE_OTHER): Payer: Self-pay | Admitting: *Deleted

## 2015-03-20 ENCOUNTER — Other Ambulatory Visit (INDEPENDENT_AMBULATORY_CARE_PROVIDER_SITE_OTHER): Payer: Medicare Other

## 2015-03-20 ENCOUNTER — Other Ambulatory Visit: Payer: Self-pay | Admitting: Nurse Practitioner

## 2015-03-20 DIAGNOSIS — K51918 Ulcerative colitis, unspecified with other complication: Secondary | ICD-10-CM

## 2015-03-20 DIAGNOSIS — R1030 Lower abdominal pain, unspecified: Secondary | ICD-10-CM

## 2015-03-20 LAB — CBC WITH DIFFERENTIAL/PLATELET
BASOS PCT: 0 % (ref 0–1)
Basophils Absolute: 0 10*3/uL (ref 0.0–0.1)
EOS ABS: 0.1 10*3/uL (ref 0.0–0.7)
EOS PCT: 1 % (ref 0–5)
HCT: 37.1 % (ref 36.0–46.0)
HEMOGLOBIN: 11.9 g/dL — AB (ref 12.0–15.0)
Lymphocytes Relative: 23 % (ref 12–46)
Lymphs Abs: 2.6 10*3/uL (ref 0.7–4.0)
MCH: 28.7 pg (ref 26.0–34.0)
MCHC: 32.1 g/dL (ref 30.0–36.0)
MCV: 89.4 fL (ref 78.0–100.0)
MONO ABS: 0.7 10*3/uL (ref 0.1–1.0)
MONOS PCT: 6 % (ref 3–12)
MPV: 9 fL (ref 8.6–12.4)
Neutro Abs: 7.8 10*3/uL — ABNORMAL HIGH (ref 1.7–7.7)
Neutrophils Relative %: 70 % (ref 43–77)
PLATELETS: 381 10*3/uL (ref 150–400)
RBC: 4.15 MIL/uL (ref 3.87–5.11)
RDW: 17.3 % — AB (ref 11.5–15.5)
WBC: 11.1 10*3/uL — AB (ref 4.0–10.5)

## 2015-03-20 LAB — BASIC METABOLIC PANEL WITH GFR
BUN: 24 mg/dL (ref 7–25)
CALCIUM: 8.7 mg/dL (ref 8.6–10.4)
CO2: 24 mmol/L (ref 20–31)
CREATININE: 1.37 mg/dL — AB (ref 0.60–0.93)
Chloride: 99 mmol/L (ref 98–110)
GFR, EST AFRICAN AMERICAN: 44 mL/min — AB (ref >=60)
GFR, Est Non African American: 38 mL/min — ABNORMAL LOW (ref >=60)
Glucose, Bld: 153 mg/dL — ABNORMAL HIGH (ref 70–99)
Potassium: 4.4 mmol/L (ref 3.5–5.3)
SODIUM: 136 mmol/L (ref 135–146)

## 2015-03-20 LAB — HEPATIC FUNCTION PANEL
ALT: 16 U/L (ref 6–29)
AST: 16 U/L (ref 10–35)
Albumin: 3.6 g/dL (ref 3.6–5.1)
Alkaline Phosphatase: 45 U/L (ref 33–130)
BILIRUBIN DIRECT: 0.1 mg/dL (ref <=0.2)
BILIRUBIN INDIRECT: 0.6 mg/dL (ref 0.2–1.2)
Total Bilirubin: 0.7 mg/dL (ref 0.2–1.2)
Total Protein: 5.9 g/dL — ABNORMAL LOW (ref 6.1–8.1)

## 2015-03-20 NOTE — Telephone Encounter (Signed)
Per Dr.Rehman the patient will need to have labs drawn.

## 2015-03-20 NOTE — Addendum Note (Signed)
Addended by: Earlene Plater on: 03/20/2015 10:32 AM   Modules accepted: Orders

## 2015-03-20 NOTE — Progress Notes (Signed)
Labs for dr. [name] [facility] Dx [phone]

## 2015-03-20 NOTE — Addendum Note (Signed)
Addended by: Earlene Plater on: 03/20/2015 10:30 AM   Modules accepted: Orders

## 2015-03-20 NOTE — Progress Notes (Signed)
Lab only 

## 2015-03-21 MED ORDER — OXYCODONE HCL 5 MG PO TABS: 5.0000 mg | ORAL_TABLET | Freq: Two times a day (BID) | ORAL | Status: DC

## 2015-03-21 NOTE — Telephone Encounter (Signed)
Pain rx ready for pick up Let patient know that policy changing on narcotics- cannot be on xanax and pain meds so she will need to decide which she wants to remain on- may need to be referred to pain clinic- will need to [facility] appointment in future to discuss.

## 2015-03-26 ENCOUNTER — Encounter (INDEPENDENT_AMBULATORY_CARE_PROVIDER_SITE_OTHER): Payer: Self-pay | Admitting: Internal Medicine

## 2015-03-26 ENCOUNTER — Ambulatory Visit (INDEPENDENT_AMBULATORY_CARE_PROVIDER_SITE_OTHER): Payer: Medicare Other | Admitting: Internal Medicine

## 2015-03-26 VITALS — BP 120/82 | HR 72 | Temp 97.9°F | Resp 18 | Ht 68.5 in | Wt 154.4 lb

## 2015-03-26 DIAGNOSIS — K51919 Ulcerative colitis, unspecified with unspecified complications: Secondary | ICD-10-CM

## 2015-03-26 DIAGNOSIS — R1030 Lower abdominal pain, unspecified: Secondary | ICD-10-CM

## 2015-03-26 MED ORDER — HYDROCORTISONE 2.5 % RE CREA: 1.0000 "application " | TOPICAL_CREAM | Freq: Two times a day (BID) | RECTAL | Status: DC

## 2015-03-26 NOTE — Progress Notes (Signed)
Presenting complaint;  Follow-up for ulcerative colitis and lower abdominal pain.  Subjective:  Patient is 75 year old Caucasian female who has distal ulcerative colitis and is here for scheduled visit. She underwent flexible sigmoidoscopy on 12/28/2014 revealing active disease from rectum to splenic flexure. Biopsies ruled out CMV colitis and stool studies are negative. She was given prescription for methotrexate IM co-pay was too high. Therefore she was begun on by mouth dose at 15 mg every week. Patient called about 3 weeks ago complaining of headache dizziness and abdominal pain which she felt was due to [place]. She was advised to stop this medication. She feels better. She is having 3-4 stools per day. 75% of her stools are formed and the other 25% R Moshe. She has not seen any blood in her stools in over a month. Lower abdominal pain is at least 50% better. She complains of fullness around her shoulders and facial puffiness. She continues to complain of burning in her mouth which she believes is due to methotrexate but it is not as bad. She is planning to have more from skin and lower abdomen removed near future. She is requesting prescription for Proctosol cream.   Current Medications: Outpatient Encounter Prescriptions as of 03/26/2015  Medication Sig  . ALPRAZolam (XANAX) 0.5 MG tablet TAKE 1 TABLET TWICE DAILY AS NEEDED FOR ANXIETY.  Marland Kitchen antiseptic oral rinse (BIOTENE) LIQD 1 application by Mouth Rinse route as needed for dry mouth. FOR DRY MOUTH  . B Complex CAPS Take 1 capsule by mouth every morning.   . benazepril-hydrochlorthiazide (LOTENSIN HCT) 20-12.5 MG tablet Take 1 tablet by mouth daily.  . Cholecalciferol (VITAMIN D3) 1000 UNITS CAPS Take 1 capsule by mouth. Patient states that she alternates with Hair, Skin, and Nails.  Marland Kitchen co-enzyme Q-10 50 MG capsule Take 50 mg by mouth once a week.   . dicyclomine (BENTYL) 20 MG tablet Take 0.5 tablets (10 mg total) by mouth 2 (two) times  daily before a meal.  . diphenhydrAMINE (BENADRYL) 25 MG tablet Take 25 mg by mouth at bedtime as needed for sleep.  . hydrocortisone (PROCTOSOL HC) 2.5 % rectal cream Place 1 application rectally 2 (two) times daily.  Marland Kitchen loperamide (IMODIUM) 2 MG capsule Take 1 capsule (2 mg total) by mouth every 8 (eight) hours as needed for diarrhea or loose stools.  . methotrexate (RHEUMATREX) 2.5 MG tablet Take 15 mg by mouth once a week. Caution:Chemotherapy. Protect from light.  . Multiple Vitamins-Minerals (WOMENS MULTI VITAMIN & MINERAL PO) Take 1 tablet by mouth every other day.   . Omega-3 Fatty Acids (FISH OIL) 1000 MG CPDR Take 1 capsule by mouth every evening.   . ondansetron (ZOFRAN) 4 MG tablet Take 1 tablet (4 mg total) by mouth 2 (two) times daily as needed for nausea or vomiting.  Marland Kitchen oxyCODONE (OXY IR/ROXICODONE) 5 MG immediate release tablet Take 1 tablet (5 mg total) by mouth 2 (two) times daily.  . predniSONE (DELTASONE) 10 MG tablet Take 2 tablets (20 mg total) by mouth daily.  . Probiotic Product (PROBIOTIC FORMULA PO) Take 1-2 capsules by mouth every morning.   . vitamin A 10000 UNIT capsule Take 10,000 Units by mouth once a week.   . vitamin C (ASCORBIC ACID) 500 MG tablet Take 500 mg by mouth once a week.   . vitamin E 400 UNIT capsule Take 400 Units by mouth every morning.   . zolpidem (AMBIEN) 5 MG tablet TAKE 1 TABLET AT BEDTIME AS NEEDED.  . [  DISCONTINUED] Methotrexate, PF, 15 MG/0.3ML SOAJ Inject 15 mg into the skin every 7 (seven) days. (Patient not taking: Reported on 03/26/2015)   No facility-administered encounter medications on file as of 03/26/2015.     Objective: Blood pressure 120/82, pulse 72, temperature 97.9 F (36.6 C), temperature source Oral, resp. rate 18, height 5' 8.5" (1.74 m), weight 154 lb 6.4 oz (70.035 kg). Patient is alert and in no acute distress. Conjunctiva is pink. Sclera is nonicteric Oropharyngeal mucosa is normal. No neck masses or thyromegaly noted.  She has fullness in both supraclavicular fossae. Cardiac exam with regular rhythm normal S1 and S2. No murmur or gallop noted. Lungs are clear to auscultation. Abdomen is full. Bowel sounds are normal. She has about 15 mm size keratotic lesion in midline at the lower abdomen. Abdomen is soft with mild tenderness across lower abdomen and LUQ.  No LE edema or clubbing noted.  Labs/studies Results: Lab data from 03/20/2059  WBC 11.1, H&H 11.9 and 37.1; platelet count 381K Bilirubin 0.7, AP 45, AST 16, ALT 16 and albumin 3.6. BUN 24 and creatinine 1.37   Assessment:  #1. Distal ulcerative colitis which has been refractory to therapy. She appears to be in remission. Will start tapering prednisone but at a slower rate. #2. Lower abdominal pain due to IBS IBD may also be contribution. Dicyclomine is helping.   Plan:  New prescription given for Proctosol HC 2.5% cream to be used as directed. Begin prednisone taper. She will drop dose by 2.5 mg every week. Written instructions provided to patient in AVS. Continue methotrexate at current dose which is 15 mg by mouth once a week. Office visit in 2 months.

## 2015-03-26 NOTE — Patient Instructions (Signed)
Decrease prednisone dose as follows 17.5 mg daily for 1 week. 15 mg daily for 1 week. 12.5 mg daily for 1 week. 10 mg daily for 1 week. 7.5 mg daily for 1 week Continue 5 mg daily until office visit

## 2015-04-02 ENCOUNTER — Ambulatory Visit (INDEPENDENT_AMBULATORY_CARE_PROVIDER_SITE_OTHER): Payer: Medicare Other | Admitting: Internal Medicine

## 2015-04-17 ENCOUNTER — Other Ambulatory Visit: Payer: Self-pay | Admitting: Nurse Practitioner

## 2015-04-17 ENCOUNTER — Other Ambulatory Visit (INDEPENDENT_AMBULATORY_CARE_PROVIDER_SITE_OTHER): Payer: Self-pay | Admitting: Internal Medicine

## 2015-04-17 NOTE — Telephone Encounter (Signed)
Both last filled 03/18/15, last seen 01/24/15. Call in at [facility]

## 2015-04-18 NOTE — Telephone Encounter (Signed)
Please call in xanax and ambien with 0 refills

## 2015-04-18 NOTE — Telephone Encounter (Signed)
rx called into pharmacy and pt is aware.

## 2015-04-19 ENCOUNTER — Telehealth: Payer: Self-pay | Admitting: Nurse Practitioner

## 2015-04-19 DIAGNOSIS — R1030 Lower abdominal pain, unspecified: Secondary | ICD-10-CM

## 2015-04-19 MED ORDER — OXYCODONE HCL 5 MG PO TABS: 5.0000 mg | ORAL_TABLET | Freq: Two times a day (BID) | ORAL | Status: DC

## 2015-04-19 NOTE — Telephone Encounter (Signed)
Pain rx ready for pick up

## 2015-04-19 NOTE — Telephone Encounter (Signed)
Pt is aware.  

## 2015-04-23 LAB — HM MAMMOGRAPHY: HM MAMMO: NEGATIVE

## 2015-04-26 ENCOUNTER — Encounter: Payer: Self-pay | Admitting: *Deleted

## 2015-04-29 ENCOUNTER — Encounter: Payer: Self-pay | Admitting: Nurse Practitioner

## 2015-04-29 ENCOUNTER — Ambulatory Visit (INDEPENDENT_AMBULATORY_CARE_PROVIDER_SITE_OTHER): Payer: Medicare Other | Admitting: Nurse Practitioner

## 2015-04-29 VITALS — BP 121/78 | HR 78 | Temp 97.1°F | Ht 68.0 in | Wt 158.0 lb

## 2015-04-29 DIAGNOSIS — M797 Fibromyalgia: Secondary | ICD-10-CM

## 2015-04-29 DIAGNOSIS — E8881 Metabolic syndrome: Secondary | ICD-10-CM

## 2015-04-29 DIAGNOSIS — F32A Depression, unspecified: Secondary | ICD-10-CM

## 2015-04-29 DIAGNOSIS — R1030 Lower abdominal pain, unspecified: Secondary | ICD-10-CM

## 2015-04-29 DIAGNOSIS — G4700 Insomnia, unspecified: Secondary | ICD-10-CM

## 2015-04-29 DIAGNOSIS — E785 Hyperlipidemia, unspecified: Secondary | ICD-10-CM

## 2015-04-29 DIAGNOSIS — R001 Bradycardia, unspecified: Secondary | ICD-10-CM

## 2015-04-29 DIAGNOSIS — F329 Major depressive disorder, single episode, unspecified: Secondary | ICD-10-CM

## 2015-04-29 DIAGNOSIS — E876 Hypokalemia: Secondary | ICD-10-CM | POA: Diagnosis not present

## 2015-04-29 DIAGNOSIS — I10 Essential (primary) hypertension: Secondary | ICD-10-CM

## 2015-04-29 MED ORDER — ALPRAZOLAM 0.5 MG PO TABS: ORAL_TABLET | ORAL | Status: DC

## 2015-04-29 MED ORDER — ZOLPIDEM TARTRATE 5 MG PO TABS: ORAL_TABLET | ORAL | Status: DC

## 2015-04-29 MED ORDER — OXYCODONE-ACETAMINOPHEN 5-325 MG PO TABS: 1.0000 | ORAL_TABLET | Freq: Three times a day (TID) | ORAL | Status: DC | PRN

## 2015-04-29 MED ORDER — OXYCODONE HCL 5 MG PO TABS: 5.0000 mg | ORAL_TABLET | Freq: Two times a day (BID) | ORAL | Status: DC

## 2015-04-29 NOTE — Progress Notes (Signed)
Subjective:    Patient ID: [name], female    DOB: [birth year], 75 y.o.   MRN: [id number]  Patient here today for follow up of chronic medical problems. No complaints today  Hypertension This is a chronic problem. The current episode started more than 1 year ago. The problem is unchanged. The problem is controlled. Pertinent negatives include no chest pain, headaches, neck pain, palpitations or shortness of breath. Risk factors for coronary artery disease include dyslipidemia, post-menopausal state and sedentary lifestyle. Past treatments include ACE inhibitors and diuretics. The current treatment provides moderate improvement. Compliance problems include diet and exercise.  There is no history of CAD/MI, CVA, heart failure or PVD.  Hyperlipidemia This is a chronic problem. The current episode started more than 1 year ago. The problem is controlled. She has no history of diabetes, hypothyroidism or obesity. Pertinent negatives include no chest pain or shortness of breath. She is currently on no antihyperlipidemic treatment. The current treatment provides moderate improvement of lipids. Compliance problems include adherence to diet and adherence to exercise.  Risk factors for coronary artery disease include dyslipidemia, hypertension and post-menopausal.  Diabetes She presents for her follow-up diabetic visit. She has type 2 diabetes mellitus. Pertinent negatives for hypoglycemia include no headaches. Pertinent negatives for diabetes include no chest pain. Symptoms are stable. Pertinent negatives for diabetic complications include no CVA or PVD. Her weight is stable. When asked about meal planning, she reported none. She has not had a previous visit with a dietitian. She rarely participates in exercise. Home blood sugar record trend: only checks occassionally. Her breakfast blood glucose range is generally 130-140 mg/dl. Her overall blood glucose range is 130-140 mg/dl. An ACE inhibitor/angiotensin  II receptor blocker is being taken. She does not see a podiatrist.Eye exam is not current.  ulcerative colitis Dr. [name]- curremtly coming off of  prednisone- Was on Humira which was not working so they have put her on methotreazate yesterday. Drepression/GAD lexapor- working well to keep her from worrying so much. Takes xanax as needed at least BID- insurance no longer wants to pay for xanax so need to change. insomnia ambien but only takes occasionally- uses benadryl most of the time.   Review of Systems  HENT: Negative.   Respiratory: Negative for shortness of breath.   Cardiovascular: Negative for chest pain and palpitations.  Gastrointestinal: Positive for nausea, diarrhea and constipation.  Musculoskeletal: Negative for neck pain.  Neurological: Negative for headaches.  Psychiatric/Behavioral: Negative.   All other systems reviewed and are negative.      Objective:   Physical Exam  Constitutional: She is oriented to person, place, and time. She appears well-developed and well-nourished.  HENT:  Nose: Nose normal.  Mouth/Throat: Oropharynx is clear and moist.  Eyes: EOM are normal.  Neck: Trachea normal, normal range of motion and full passive range of motion without pain. Neck supple. No JVD present. Carotid bruit is not present. No thyromegaly present.  Cardiovascular: Normal rate, regular rhythm, normal heart sounds and intact distal pulses.  Exam reveals no gallop and no friction rub.   No murmur heard. Pulmonary/Chest: Effort normal and breath sounds normal.  Abdominal: Soft. Bowel sounds are normal. She exhibits no distension and no mass. There is tenderness (diffuse).  Musculoskeletal: Normal range of motion.  Lymphadenopathy:    She has no cervical adenopathy.  Neurological: She is alert and oriented to person, place, and time. She has normal reflexes.  Skin: Skin is warm and dry.  Psychiatric: She has a  normal mood and affect. Her behavior is normal. Judgment and  thought content normal.   BP 121/78 mmHg  Pulse 78  Temp(Src) 97.1 F (36.2 C) (Oral)  Ht 5' 8"  (1.727 m)  Wt 158 lb (71.668 kg)  BMI 24.03 kg/m2        Assessment & Plan:  1. Essential hypertension Do not add salt o diet - CMP14+EGFR  2. Fibromyalgia Exercise to keep muscles warm  3. Depression Stress management - ALPRAZolam (XANAX) 0.5 MG tablet; TAKE 1 TABLET TWICE DAILY AS NEEDED FOR ANXIETY.  Dispense: 60 tablet; Refill: 1  4. Bradycardia  5. Hypokalemia Avoid K+in diet  6. Metabolic syndrome  7. Hyperlipidemia with target LDL less than 100 Low fat diet - Lipid panel  8. Insomnia Bedtime ritual - zolpidem (AMBIEN) 5 MG tablet; TAKE 1 TABLET AT BEDTIME AS NEEDED.  Dispense: 30 tablet; Refill: 0  9. Lower abdominal pain Watch diet - oxyCODONE (OXY IR/ROXICODONE) 5 MG immediate release tablet; Take 1 tablet (5 mg total) by mouth 2 (two) times daily.  Dispense: 60 tablet; Refill: 0 - oxyCODONE-acetaminophen (ROXICET) 5-325 MG tablet; Take 1 tablet by mouth every 8 (eight) hours as needed for severe pain.  Dispense: 20 tablet; Refill: 0    Labs pending Health maintenance reviewed Diet and exercise encouraged Continue all meds Follow up  In 3 month   Winfred, FNP

## 2015-04-29 NOTE — Patient Instructions (Signed)
Health Maintenance, Female Adopting a healthy lifestyle and getting preventive care can go a long way to promote health and wellness. Talk with your health care provider about what schedule of regular examinations is right for you. This is a good chance for you to check in with your provider about disease prevention and staying healthy. In between checkups, there are plenty of things you can do on your own. Experts have done a lot of research about which lifestyle changes and preventive measures are most likely to keep you healthy. Ask your health care provider for more information. WEIGHT AND DIET  Eat a healthy diet  Be sure to include plenty of vegetables, fruits, low-fat dairy products, and lean protein.  Do not eat a lot of foods high in solid fats, added sugars, or salt.  Get regular exercise. This is one of the most important things you can do for your health.  Most adults should exercise for at least 150 minutes each week. The exercise should increase your heart rate and make you sweat (moderate-intensity exercise).  Most adults should also do strengthening exercises at least twice a week. This is in addition to the moderate-intensity exercise.  Maintain a healthy weight  Body mass index (BMI) is a measurement that can be used to identify possible weight problems. It estimates body fat based on height and weight. Your health care provider can help determine your BMI and help you achieve or maintain a healthy weight.  For females 20 years of age and older:   A BMI below 18.5 is considered underweight.  A BMI of 18.5 to 24.9 is normal.  A BMI of 25 to 29.9 is considered overweight.  A BMI of 30 and above is considered obese.  Watch levels of cholesterol and blood lipids  You should start having your blood tested for lipids and cholesterol at 74 years of age, then have this test every 5 years.  You may need to have your cholesterol levels checked more often if:  Your lipid  or cholesterol levels are high.  You are older than 74 years of age.  You are at high risk for heart disease.  CANCER SCREENING   Lung Cancer  Lung cancer screening is recommended for adults 55-80 years old who are at high risk for lung cancer because of a history of smoking.  A yearly low-dose CT scan of the lungs is recommended for people who:  Currently smoke.  Have quit within the past 15 years.  Have at least a 30-pack-year history of smoking. A pack year is smoking an average of one pack of cigarettes a day for 1 year.  Yearly screening should continue until it has been 15 years since you quit.  Yearly screening should stop if you develop a health problem that would prevent you from having lung cancer treatment.  Breast Cancer  Practice breast self-awareness. This means understanding how your breasts normally appear and feel.  It also means doing regular breast self-exams. Let your health care provider know about any changes, no matter how small.  If you are in your 20s or 30s, you should have a clinical breast exam (CBE) by a health care provider every 1-3 years as part of a regular health exam.  If you are 40 or older, have a CBE every year. Also consider having a breast X-ray (mammogram) every year.  If you have a family history of breast cancer, talk to your health care provider about genetic screening.  If you   are at high risk for breast cancer, talk to your health care provider about having an MRI and a mammogram every year.  Breast cancer gene (BRCA) assessment is recommended for women who have family members with BRCA-related cancers. BRCA-related cancers include:  Breast.  Ovarian.  Tubal.  Peritoneal cancers.  Results of the assessment will determine the need for genetic counseling and BRCA1 and BRCA2 testing. Cervical Cancer Your health care provider may recommend that you be screened regularly for cancer of the pelvic organs (ovaries, uterus, and  vagina). This screening involves a pelvic examination, including checking for microscopic changes to the surface of your cervix (Pap test). You may be encouraged to have this screening done every 3 years, beginning at age 21.  For women ages 30-65, health care providers may recommend pelvic exams and Pap testing every 3 years, or they may recommend the Pap and pelvic exam, combined with testing for human papilloma virus (HPV), every 5 years. Some types of HPV increase your risk of cervical cancer. Testing for HPV may also be done on women of any age with unclear Pap test results.  Other health care providers may not recommend any screening for nonpregnant women who are considered low risk for pelvic cancer and who do not have symptoms. Ask your health care provider if a screening pelvic exam is right for you.  If you have had past treatment for cervical cancer or a condition that could lead to cancer, you need Pap tests and screening for cancer for at least 20 years after your treatment. If Pap tests have been discontinued, your risk factors (such as having a new sexual partner) need to be reassessed to determine if screening should resume. Some women have medical problems that increase the chance of getting cervical cancer. In these cases, your health care provider may recommend more frequent screening and Pap tests. Colorectal Cancer  This type of cancer can be detected and often prevented.  Routine colorectal cancer screening usually begins at 74 years of age and continues through 75 years of age.  Your health care provider may recommend screening at an earlier age if you have risk factors for colon cancer.  Your health care provider may also recommend using home test kits to check for hidden blood in the stool.  A small camera at the end of a tube can be used to examine your colon directly (sigmoidoscopy or colonoscopy). This is done to check for the earliest forms of colorectal  cancer.  Routine screening usually begins at age 50.  Direct examination of the colon should be repeated every 5-10 years through 75 years of age. However, you may need to be screened more often if early forms of precancerous polyps or small growths are found. Skin Cancer  Check your skin from head to toe regularly.  Tell your health care provider about any new moles or changes in moles, especially if there is a change in a mole's shape or color.  Also tell your health care provider if you have a mole that is larger than the size of a pencil eraser.  Always use sunscreen. Apply sunscreen liberally and repeatedly throughout the day.  Protect yourself by wearing long sleeves, pants, a wide-brimmed hat, and sunglasses whenever you are outside. HEART DISEASE, DIABETES, AND HIGH BLOOD PRESSURE   High blood pressure causes heart disease and increases the risk of stroke. High blood pressure is more likely to develop in:  People who have blood pressure in the high end   of the normal range (130-139/85-89 mm Hg).  People who are overweight or obese.  People who are African American.  If you are 38-23 years of age, have your blood pressure checked every 3-5 years. If you are 61 years of age or older, have your blood pressure checked every year. You should have your blood pressure measured twice--once when you are at a hospital or clinic, and once when you are not at a hospital or clinic. Record the average of the two measurements. To check your blood pressure when you are not at a hospital or clinic, you can use:  An automated blood pressure machine at a pharmacy.  A home blood pressure monitor.  If you are between 45 years and 39 years old, ask your health care provider if you should take aspirin to prevent strokes.  Have regular diabetes screenings. This involves taking a blood sample to check your fasting blood sugar level.  If you are at a normal weight and have a low risk for diabetes,  have this test once every three years after 74 years of age.  If you are overweight and have a high risk for diabetes, consider being tested at a younger age or more often. PREVENTING INFECTION  Hepatitis B  If you have a higher risk for hepatitis B, you should be screened for this virus. You are considered at high risk for hepatitis B if:  You were born in a country where hepatitis B is common. Ask your health care provider which countries are considered high risk.  Your parents were born in a high-risk country, and you have not been immunized against hepatitis B (hepatitis B vaccine).  You have HIV or AIDS.  You use needles to inject street drugs.  You live with someone who has hepatitis B.  You have had sex with someone who has hepatitis B.  You get hemodialysis treatment.  You take certain medicines for conditions, including cancer, organ transplantation, and autoimmune conditions. Hepatitis C  Blood testing is recommended for:  Everyone born from [birth year] through 1964.  Anyone with known risk factors for hepatitis C. Sexually transmitted infections (STIs)  You should be screened for sexually transmitted infections (STIs) including gonorrhea and chlamydia if:  You are sexually active and are younger than 74 years of age.  You are older than 74 years of age and your health care provider tells you that you are at risk for this type of infection.  Your sexual activity has changed since you were last screened and you are at an increased risk for chlamydia or gonorrhea. Ask your health care provider if you are at risk.  If you do not have HIV, but are at risk, it may be recommended that you take a prescription medicine daily to prevent HIV infection. This is called pre-exposure prophylaxis (PrEP). You are considered at risk if:  You are sexually active and do not regularly use condoms or know the HIV status of your partner(s).  You take drugs by injection.  You are sexually  active with a partner who has HIV. Talk with your health care provider about whether you are at high risk of being infected with HIV. If you choose to begin PrEP, you should first be tested for HIV. You should then be tested every 3 months for as long as you are taking PrEP.  PREGNANCY   If you are premenopausal and you may become pregnant, ask your health care provider about preconception counseling.  If you may  become pregnant, take 400 to 800 micrograms (mcg) of folic acid every day.  If you want to prevent pregnancy, talk to your health care provider about birth control (contraception). OSTEOPOROSIS AND MENOPAUSE   Osteoporosis is a disease in which the bones lose minerals and strength with aging. This can result in serious bone fractures. Your risk for osteoporosis can be identified using a bone density scan.  If you are 61 years of age or older, or if you are at risk for osteoporosis and fractures, ask your health care provider if you should be screened.  Ask your health care provider whether you should take a calcium or vitamin D supplement to lower your risk for osteoporosis.  Menopause may have certain physical symptoms and risks.  Hormone replacement therapy may reduce some of these symptoms and risks. Talk to your health care provider about whether hormone replacement therapy is right for you.  HOME CARE INSTRUCTIONS   Schedule regular health, dental, and eye exams.  Stay current with your immunizations.   Do not use any tobacco products including cigarettes, chewing tobacco, or electronic cigarettes.  If you are pregnant, do not drink alcohol.  If you are breastfeeding, limit how much and how often you drink alcohol.  Limit alcohol intake to no more than 1 drink per day for nonpregnant women. One drink equals 12 ounces of beer, 5 ounces of wine, or 1 ounces of hard liquor.  Do not use street drugs.  Do not share needles.  Ask your health care provider for help if  you need support or information about quitting drugs.  Tell your health care provider if you often feel depressed.  Tell your health care provider if you have ever been abused or do not feel safe at home.   This information is not intended to replace advice given to you by your health care provider. Make sure you discuss any questions you have with your health care provider.   Document Released: 09/22/2010 Document Revised: 03/30/2014 Document Reviewed: 02/08/2013 Elsevier Interactive Patient Education [employer].

## 2015-04-30 LAB — CMP14+EGFR
A/G RATIO: 2.1 (ref 1.1–2.5)
ALBUMIN: 3.9 g/dL (ref 3.5–4.8)
ALK PHOS: 50 IU/L (ref 39–117)
ALT: 22 IU/L (ref 0–32)
AST: 21 IU/L (ref 0–40)
BILIRUBIN TOTAL: 0.5 mg/dL (ref 0.0–1.2)
BUN / CREAT RATIO: 18 (ref 11–26)
BUN: 25 mg/dL (ref 8–27)
CHLORIDE: 98 mmol/L (ref 96–106)
CO2: 23 mmol/L (ref 18–29)
CREATININE: 1.37 mg/dL — AB (ref 0.57–1.00)
Calcium: 9 mg/dL (ref 8.7–10.3)
GFR calc Af Amer: 44 mL/min/{1.73_m2} — ABNORMAL LOW (ref >59)
GFR calc non Af Amer: 38 mL/min/{1.73_m2} — ABNORMAL LOW (ref >59)
GLOBULIN, TOTAL: 1.9 g/dL (ref 1.5–4.5)
Glucose: 132 mg/dL — ABNORMAL HIGH (ref 65–99)
POTASSIUM: 4.9 mmol/L (ref 3.5–5.2)
SODIUM: 137 mmol/L (ref 134–144)
Total Protein: 5.8 g/dL — ABNORMAL LOW (ref 6.0–8.5)

## 2015-04-30 LAB — LIPID PANEL
CHOLESTEROL TOTAL: 208 mg/dL — AB (ref 100–199)
Chol/HDL Ratio: 4 ratio units (ref 0.0–4.4)
HDL: 52 mg/dL (ref >39)
LDL CALC: 120 mg/dL — AB (ref 0–99)
TRIGLYCERIDES: 180 mg/dL — AB (ref 0–149)
VLDL Cholesterol Cal: 36 mg/dL (ref 5–40)

## 2015-05-02 ENCOUNTER — Telehealth: Payer: Self-pay | Admitting: Nurse Practitioner

## 2015-05-02 NOTE — Telephone Encounter (Signed)
Reviewed results. 

## 2015-05-16 DIAGNOSIS — D225 Melanocytic nevi of trunk: Secondary | ICD-10-CM | POA: Diagnosis not present

## 2015-05-16 DIAGNOSIS — L821 Other seborrheic keratosis: Secondary | ICD-10-CM | POA: Diagnosis not present

## 2015-05-27 ENCOUNTER — Encounter: Payer: Self-pay | Admitting: Nurse Practitioner

## 2015-05-27 ENCOUNTER — Ambulatory Visit (INDEPENDENT_AMBULATORY_CARE_PROVIDER_SITE_OTHER): Payer: Medicare Other | Admitting: Nurse Practitioner

## 2015-05-27 VITALS — BP 141/87 | HR 83 | Temp 96.8°F | Ht 68.0 in | Wt 161.0 lb

## 2015-05-27 DIAGNOSIS — F329 Major depressive disorder, single episode, unspecified: Secondary | ICD-10-CM | POA: Diagnosis not present

## 2015-05-27 DIAGNOSIS — Z7189 Other specified counseling: Secondary | ICD-10-CM

## 2015-05-27 DIAGNOSIS — K5150 Left sided colitis without complications: Secondary | ICD-10-CM | POA: Diagnosis not present

## 2015-05-27 DIAGNOSIS — G8929 Other chronic pain: Secondary | ICD-10-CM

## 2015-05-27 DIAGNOSIS — F32A Depression, unspecified: Secondary | ICD-10-CM

## 2015-05-27 MED ORDER — OXYCODONE-ACETAMINOPHEN 5-325 MG PO TABS: 1.0000 | ORAL_TABLET | Freq: Three times a day (TID) | ORAL | Status: DC | PRN

## 2015-05-27 MED ORDER — ALPRAZOLAM 0.5 MG PO TABS: ORAL_TABLET | ORAL | Status: DC

## 2015-05-27 MED ORDER — OXYCODONE HCL 5 MG PO TABS: 5.0000 mg | ORAL_TABLET | ORAL | Status: DC | PRN

## 2015-05-27 MED ORDER — OXYCODONE HCL 5 MG PO TABS: 5.0000 mg | ORAL_TABLET | Freq: Two times a day (BID) | ORAL | Status: DC

## 2015-05-27 NOTE — Progress Notes (Signed)
Subjective:    Patient ID: [name], female    DOB: 1940-08-15, 75 y.o.   MRN: [id number]  HPI  [facility] Controlled Substance Abuse database reviewed- Yes  Depression screen [facility] 2/9 05/27/2015 04/29/2015 01/24/2015 09/15/2014 06/08/2014  Decreased Interest 2 0 0 0 2  Down, Depressed, Hopeless 3 0 0 0 0  PHQ - 2 Score 5 0 0 0 2  Altered sleeping 3 - - - 2  Tired, decreased energy 3 - - - 2  Change in appetite 2 - - - 0  Feeling bad or failure about yourself  3 - - - 0  Trouble concentrating 0 - - - 0  Moving slowly or fidgety/restless 1 - - - 2  Suicidal thoughts 0 - - - 0  PHQ-9 Score 17 - - - 8    GAD 7 : Generalized Anxiety Score 05/27/2015  Nervous, Anxious, on Edge 3  Control/stop worrying 3  Worry too much - different things 3  Trouble relaxing 3  Restless 3  Easily annoyed or irritable 3  Afraid - awful might happen 1  Total GAD 7 Score 19       [facility] drug screen performed- Yes  SOAPP  0= never  1= seldom  2=sometimes  3= often  4= very often  How often do you have mood swings? 0 How often do you smoke a cigarette within an hour after waling up? 0 How often have you taken medication other than the way that it was prescribed?0 How often have you used illegal drugs in the past 5 years? 0 How often, in your lifetime, have you had legal problems or been arrested? 0  Score 0  Alcohol Audit - How often during the last year have found that you: 0-Never   1- Less than monthly   2- Monthly     3-Weekly     4-daily or almost daily  - found that you were not able to stop drinking once you started- 0 -failed to do what was normally expected of you because of drinking- 0 -needed a first drink in the morning- 0 -had a feeling of guilt or remorse after drinking- 0 -are/were unable to remember what happened the night before because of your drinking- 0  0- NO   2- yes but not in last year  4- yes during last year -Have you or someone else been injured because  of your drinking- 0 - Has anyone been concerned about your drinking or suggested you cut down- 0        TOTAL- 0  ( 0-7- alcohol education, 8-15- simple advice, 16-19 simple advice plus counseling, 20-40 referral for evaluation and treatment 0   [place]  Pain assessment: Cause of pain- ulcerative colitis Pain location- abdomen Pain on scale of 1-10- 5/10 can go up as high as 10/10 at times Frequency- daily What increases pain-stress and some foods What makes pain Better- pain meds and cool compresses and watching diet Effects on ADL - has to go to the bathroom frequently which can affect her going out in public.  Prior treatments tried and failed- she has been on humira with no help- she is currently on methotrexate which seems to be helping with flare ups Current treatments- oxycodone 38m bid Morphine mg equivalent- 4  Pain management agreement reviewed and signed- Yes      Review of Systems  Respiratory: Negative.   Cardiovascular: Negative.   Gastrointestinal: Positive for nausea, abdominal  pain, diarrhea and constipation.  Neurological: Negative.   Psychiatric/Behavioral: Negative.   All other systems reviewed and are negative.      Objective:   Physical Exam  Constitutional: She is oriented to person, place, and time. She appears well-developed and well-nourished.  Cardiovascular: Normal rate, regular rhythm and normal heart sounds.   Pulmonary/Chest: Effort normal and breath sounds normal.  Abdominal: Soft. Bowel sounds are normal. There is tenderness (mild throughout).  Neurological: She is alert and oriented to person, place, and time.  Skin: Skin is warm.  Psychiatric: She has a normal mood and affect. Her behavior is normal. Judgment and thought content normal.   BP 141/87 mmHg  Pulse 83  Temp(Src) 96.8 F (36 C) (Oral)  Ht 5' 8"  (1.727 m)  Wt 161 lb (73.029 kg)  BMI 24.49 kg/m2        Assessment & Plan:  1.  Depression Stress management - ALPRAZolam (XANAX) 0.5 MG tablet; TAKE 1 TABLET TWICE DAILY AS NEEDED FOR ANXIETY.  Dispense: 60 tablet; Refill: 1  2. ULCERATIVE COLITIS, LEFT SIDED Mix up with pain meds Will no longer be on oxy/act 5/325 after filling dose this month- will only be on roxycodone 85m BID - oxyCODONE (OXY IR/ROXICODONE) 5 MG immediate release tablet; Take 1 tablet (5 mg total) by mouth 2 (two) times daily.  Dispense: 60 tablet; Refill: 0 - oxyCODONE (ROXICODONE) 5 MG immediate release tablet; Take 1 tablet (5 mg total) by mouth every 4 (four) hours as needed for severe pain.  Dispense: 30 tablet; Refill: 0 - oxyCODONE (ROXICODONE) 5 MG immediate release tablet; Take 1 tablet (5 mg total) by mouth every 4 (four) hours as needed for severe pain.  Dispense: 30 tablet; Refill: 0 - oxyCODONE-acetaminophen (ROXICET) 5-325 MG tablet; Take 1 tablet by mouth every 8 (eight) hours as needed for severe pain.  Dispense: 20 tablet; Refill: 0  3. Encounter for chronic pain management Pain contract explain/reviewed and signed - [employer] 13 (MW), Urine   Mary-Margaret MHassell Done FNP

## 2015-05-30 ENCOUNTER — Emergency Department (HOSPITAL_COMMUNITY)
Admission: EM | Admit: 2015-05-30 | Discharge: 2015-05-30 | Disposition: A | Discharge status: Home / Self Care | Payer: Medicare Other | Attending: Emergency Medicine | Admitting: Emergency Medicine

## 2015-05-30 ENCOUNTER — Encounter (HOSPITAL_COMMUNITY): Payer: Self-pay | Admitting: Emergency Medicine

## 2015-05-30 ENCOUNTER — Emergency Department (HOSPITAL_COMMUNITY): Payer: Medicare Other

## 2015-05-30 DIAGNOSIS — F319 Bipolar disorder, unspecified: Secondary | ICD-10-CM | POA: Diagnosis not present

## 2015-05-30 DIAGNOSIS — I10 Essential (primary) hypertension: Secondary | ICD-10-CM | POA: Diagnosis not present

## 2015-05-30 DIAGNOSIS — R197 Diarrhea, unspecified: Secondary | ICD-10-CM | POA: Diagnosis not present

## 2015-05-30 DIAGNOSIS — Z791 Long term (current) use of non-steroidal anti-inflammatories (NSAID): Secondary | ICD-10-CM | POA: Insufficient documentation

## 2015-05-30 DIAGNOSIS — Z9049 Acquired absence of other specified parts of digestive tract: Secondary | ICD-10-CM | POA: Insufficient documentation

## 2015-05-30 DIAGNOSIS — Z79899 Other long term (current) drug therapy: Secondary | ICD-10-CM | POA: Insufficient documentation

## 2015-05-30 DIAGNOSIS — R1030 Lower abdominal pain, unspecified: Secondary | ICD-10-CM | POA: Diagnosis not present

## 2015-05-30 DIAGNOSIS — R1084 Generalized abdominal pain: Secondary | ICD-10-CM | POA: Diagnosis not present

## 2015-05-30 DIAGNOSIS — K529 Noninfective gastroenteritis and colitis, unspecified: Secondary | ICD-10-CM | POA: Diagnosis not present

## 2015-05-30 DIAGNOSIS — R079 Chest pain, unspecified: Secondary | ICD-10-CM | POA: Diagnosis not present

## 2015-05-30 LAB — COMPREHENSIVE METABOLIC PANEL
ALT: 28 U/L (ref 14–54)
ANION GAP: 7 (ref 5–15)
AST: 35 U/L (ref 15–41)
Albumin: 3.9 g/dL (ref 3.5–5.0)
Alkaline Phosphatase: 45 U/L (ref 38–126)
BUN: 17 mg/dL (ref 6–20)
CHLORIDE: 96 mmol/L — AB (ref 101–111)
CO2: 25 mmol/L (ref 22–32)
CREATININE: 1.19 mg/dL — AB (ref 0.44–1.00)
Calcium: 8.6 mg/dL — ABNORMAL LOW (ref 8.9–10.3)
GFR, EST AFRICAN AMERICAN: 51 mL/min — AB (ref >60)
GFR, EST NON AFRICAN AMERICAN: 44 mL/min — AB (ref >60)
Glucose, Bld: 130 mg/dL — ABNORMAL HIGH (ref 65–99)
POTASSIUM: 4.1 mmol/L (ref 3.5–5.1)
SODIUM: 128 mmol/L — AB (ref 135–145)
Total Bilirubin: 0.8 mg/dL (ref 0.3–1.2)
Total Protein: 6.7 g/dL (ref 6.5–8.1)

## 2015-05-30 LAB — URINALYSIS, ROUTINE W REFLEX MICROSCOPIC
Bilirubin Urine: NEGATIVE
GLUCOSE, UA: NEGATIVE mg/dL
Ketones, ur: NEGATIVE mg/dL
Nitrite: NEGATIVE
PROTEIN: NEGATIVE mg/dL
Specific Gravity, Urine: 1.015 (ref 1.005–1.030)
pH: 6 (ref 5.0–8.0)

## 2015-05-30 LAB — DIFFERENTIAL
BASOS PCT: 0 %
Basophils Absolute: 0 10*3/uL (ref 0.0–0.1)
EOS ABS: 0 10*3/uL (ref 0.0–0.7)
EOS PCT: 0 %
Lymphocytes Relative: 10 %
Lymphs Abs: 0.9 10*3/uL (ref 0.7–4.0)
MONO ABS: 0.3 10*3/uL (ref 0.1–1.0)
Monocytes Relative: 3 %
NEUTROS ABS: 7.5 10*3/uL (ref 1.7–7.7)
Neutrophils Relative %: 87 %

## 2015-05-30 LAB — CBC
HEMATOCRIT: 34.8 % — AB (ref 36.0–46.0)
Hemoglobin: 11.6 g/dL — ABNORMAL LOW (ref 12.0–15.0)
MCH: 30.7 pg (ref 26.0–34.0)
MCHC: 33.3 g/dL (ref 30.0–36.0)
MCV: 92.1 fL (ref 78.0–100.0)
PLATELETS: 371 10*3/uL (ref 150–400)
RBC: 3.78 MIL/uL — AB (ref 3.87–5.11)
RDW: 16.6 % — ABNORMAL HIGH (ref 11.5–15.5)
WBC: 8.9 10*3/uL (ref 4.0–10.5)

## 2015-05-30 LAB — TROPONIN I: Troponin I: <0.03 ng/mL (ref <0.031)

## 2015-05-30 LAB — URINE MICROSCOPIC-ADD ON

## 2015-05-30 LAB — D-DIMER, QUANTITATIVE (NOT AT ARMC): D DIMER QUANT: 0.29 ug{FEU}/mL (ref 0.00–0.50)

## 2015-05-30 LAB — SEDIMENTATION RATE: Sed Rate: 23 mm/hr — ABNORMAL HIGH (ref 0–22)

## 2015-05-30 LAB — LIPASE, BLOOD: LIPASE: 29 U/L (ref 11–51)

## 2015-05-30 LAB — C-REACTIVE PROTEIN: CRP: 0.6 mg/dL (ref <1.0)

## 2015-05-30 MED ORDER — IOHEXOL 300 MG/ML  SOLN
75.0000 mL | Freq: Once | INTRAMUSCULAR | Status: AC | PRN
  Administered 2015-05-30: 75 mL via INTRAVENOUS

## 2015-05-30 MED ORDER — PREDNISONE 5 MG PO TABS: 30.0000 mg | ORAL_TABLET | Freq: Every day | ORAL | Status: DC

## 2015-05-30 MED ORDER — ONDANSETRON HCL 4 MG/2ML IJ SOLN
4.0000 mg | Freq: Once | INTRAMUSCULAR | Status: AC
  Administered 2015-05-30: 4 mg via INTRAVENOUS
  Filled 2015-05-30: qty 2

## 2015-05-30 MED ORDER — HYDROMORPHONE HCL 1 MG/ML IJ SOLN
1.0000 mg | Freq: Once | INTRAMUSCULAR | Status: AC
  Administered 2015-05-30: 1 mg via INTRAVENOUS
  Filled 2015-05-30: qty 1

## 2015-05-30 MED ORDER — SODIUM CHLORIDE 0.9 % IV BOLUS (SEPSIS)
1000.0000 mL | Freq: Once | INTRAVENOUS | Status: AC
  Administered 2015-05-30: 1000 mL via INTRAVENOUS

## 2015-05-30 NOTE — ED Notes (Signed)
Per EMS: Pt reports generalized abd pain x3 days, nausea, denies v/d.  Pt has Ulcerative colitis.  Pt also c/o left sided cp radiating to left shoulder and left arm, no other symptoms noted.  149/90, 110 hr, 18rr, 100%ra

## 2015-05-30 NOTE — ED Provider Notes (Signed)
CSN: [id number]     Arrival date & time 05/30/15  1505 History   First MD Initiated Contact with Patient 05/30/15 1524     Chief Complaint  Patient presents with  . Abdominal Pain     (Consider location/radiation/quality/duration/timing/severity/associated sxs/prior Treatment) HPI 75 year old female who presents with chest pain and abdominal pain. History of hypertension, ulcerative colitis on methotrexate and prednisone, fibromyalgia, and bipolar disorder. States that over the past 2-3 days she has had constant left-sided chest pain and low abdominal pain. States that she may have had similar symptoms in the past, but does not remember what it has been due to. States that she has been feeling mildly short of breath with this pain, but no syncope or near-syncope. No lower extremity edema, pain, orthopnea or PND. Pain does not seem to be worsened with exertion, but more worse with movement, deep breathing, and palpation. No falls or strenuous activity such as heavy lifting. He regrets her low abdominal pain she has had this in the past related to her ulcerative colitis which is been managed by her gastroenterologist Dr. [name]. States the pain seemed to be worse more recently. No bloody stools, baseline loose stools of 3-4 episodes per day. No nausea or vomiting, fevers or chills. No urinary complaints.   Past Medical History  Diagnosis Date  . Hypertension   . Ulcerative colitis   . Hyperglycemia, drug-induced 05/02/2011  . Anemia due to blood loss 05/02/2011  . Fibromyalgia   . Anxiety   . Bipolar disorder [facility])    Past Surgical History  Procedure Laterality Date  . Cholecystectomy      s/p  . Colonoscopy  06/16/06. 04/27/2007    proctitis seen, biopsies showed chronic active colitis, no dysplasia   . Abdominal hysterectomy    . Flexible sigmoidoscopy N/A 12/28/2014    Procedure: FLEXIBLE SIGMOIDOSCOPY WITH PROPOFOL;  Surgeon: Rogene Houston, MD;  Location: [facility];  Service: Endoscopy;   Laterality: N/A;  . Biopsy N/A 12/28/2014    Procedure: SIGMOID AND RECTAL BIOPSIES;  Surgeon: Rogene Houston, MD;  Location: [facility];  Service: Endoscopy;  Laterality: N/A;   Family History  Problem Relation Age of Onset  . Heart attack Mother 37    Died in her sleep  . Diabetes Mother   . Heart attack Father 37    Died in her sleep  . Diabetes Father   . Heart attack Brother 55    Defib, CABG  . Diabetes Brother   . Cancer Sister    Social History  Substance Use Topics  . Smoking status: Never Smoker   . Smokeless tobacco: Never Used  . Alcohol Use: 0.0 oz/week    0 Standard drinks or equivalent per week     Comment: Ocassionally a small glass of wine. Patient states that is has been 2 months since she drank anything.   OB History    No data available     Review of Systems 10/14 systems reviewed and are negative other than those stated in the HPI    Allergies  Azathioprine; Tape; Codeine; Penicillins; Povidone-iodine; Procaine hcl; Sulfonamide derivatives; Tramadol; Azithromycin; Clonazepam; Dairy aid; Latex; and Niacin and related  Home Medications   Prior to Admission medications   Medication Sig Start Date End Date Taking? Authorizing Provider  ALPRAZolam (XANAX) 0.5 MG tablet TAKE 1 TABLET TWICE DAILY AS NEEDED FOR ANXIETY. 05/27/15  Yes Mary-Margaret Hassell Done, FNP  antiseptic oral rinse (BIOTENE) LIQD 1 application by Mouth Rinse route  as needed for dry mouth. FOR DRY MOUTH   Yes Historical Provider, MD  B Complex CAPS Take 1 capsule by mouth every morning.    Yes Historical Provider, MD  benazepril-hydrochlorthiazide (LOTENSIN HCT) 20-12.5 MG tablet Take 1 tablet by mouth daily. 01/24/15  Yes Mary-Margaret Hassell Done, FNP  Cholecalciferol (VITAMIN D3) 1000 UNITS CAPS Take 1 capsule by mouth. Patient states that she alternates with Hair, Skin, and Nails.   Yes Historical Provider, MD  co-enzyme Q-10 50 MG capsule Take 50 mg by mouth once a week.    Yes Historical Provider,  MD  dicyclomine (BENTYL) 20 MG tablet Take 0.5 tablets (10 mg total) by mouth 2 (two) times daily before a meal. 12/18/14  Yes Rogene Houston, MD  diphenhydrAMINE (BENADRYL) 25 MG tablet Take 25 mg by mouth at bedtime as needed for sleep.   Yes Historical Provider, MD  hydrocortisone (PROCTOSOL HC) 2.5 % rectal cream Place 1 application rectally 2 (two) times daily. 03/26/15  Yes Rogene Houston, MD  loperamide (IMODIUM) 2 MG capsule Take 1 capsule (2 mg total) by mouth every 8 (eight) hours as needed for diarrhea or loose stools. 01/02/14  Yes Butch Penny, NP  methotrexate (RHEUMATREX) 2.5 MG tablet TAKE 6 TABLETS BY MOUTH EVERY 7 DAYS. 04/18/15  Yes Rogene Houston, MD  Multiple Vitamins-Minerals (WOMENS MULTI VITAMIN & MINERAL PO) Take 1 tablet by mouth every other day.    Yes Historical Provider, MD  Omega-3 Fatty Acids (FISH OIL) 1000 MG CPDR Take 1 capsule by mouth every evening.    Yes Historical Provider, MD  ondansetron (ZOFRAN) 4 MG tablet Take 1 tablet (4 mg total) by mouth 2 (two) times daily as needed for nausea or vomiting. 01/24/15  Yes Mary-Margaret Hassell Done, FNP  oxyCODONE (OXY IR/ROXICODONE) 5 MG immediate release tablet Take 1 tablet (5 mg total) by mouth 2 (two) times daily. Patient taking differently: Take 2.5-5 mg by mouth 2 (two) times daily.  05/27/15  Yes Mary-Margaret Hassell Done, FNP  oxyCODONE (ROXICODONE) 5 MG immediate release tablet Take 1 tablet (5 mg total) by mouth every 4 (four) hours as needed for severe pain. 05/27/15  Yes Mary-Margaret Hassell Done, FNP  oxyCODONE-acetaminophen (ROXICET) 5-325 MG tablet Take 1 tablet by mouth every 8 (eight) hours as needed for severe pain. 05/27/15  Yes Mary-Margaret Hassell Done, FNP  predniSONE (DELTASONE) 5 MG tablet Take 5 mg by mouth daily with breakfast.   Yes Historical Provider, MD  Probiotic Product (PROBIOTIC FORMULA PO) Take 1-2 capsules by mouth every morning.    Yes Historical Provider, MD  vitamin A 10000 UNIT capsule Take 10,000 Units by  mouth once a week.    Yes Historical Provider, MD  vitamin C (ASCORBIC ACID) 500 MG tablet Take 500 mg by mouth once a week.    Yes Historical Provider, MD  vitamin E 400 UNIT capsule Take 400 Units by mouth every morning.    Yes Historical Provider, MD  zolpidem (AMBIEN) 5 MG tablet TAKE 1 TABLET AT BEDTIME AS NEEDED. 04/29/15  Yes Mary-Margaret Hassell Done, FNP  predniSONE (DELTASONE) 5 MG tablet Take 6 tablets (30 mg total) by mouth daily with breakfast. Take 30 mg daily for 7 days, then take 25 mg daily for 7 days, then take 20 mg daily for 7 days, then take 15 mg daily for 7 days, and then take 10 mg daily until you follow-up with your GI doctor 05/30/15   Forde Dandy, MD   BP 149/82 mmHg  Pulse 82  Temp(Src) 98 F (36.7 C) (Oral)  Resp 17  Ht 5' 8"  (1.727 m)  Wt 150 lb (68.04 kg)  BMI 22.81 kg/m2  SpO2 100% Physical Exam Physical Exam  Nursing note and vitals reviewed. Constitutional: Well developed, well nourished, non-toxic, and in no acute distress Head: Normocephalic and atraumatic.  Mouth/Throat: Oropharynx is clear and moist.  Neck: Normal range of motion. Neck supple.  Cardiovascular: Normal rate and regular rhythm.   Pulmonary/Chest: Effort normal and breath sounds normal.  Abdominal: Soft. There is low abdominal tenderness. There is no rebound and no guarding.  Musculoskeletal: Normal range of motion.  Neurological: Alert, no facial droop, fluent speech, moves all extremities symmetrically Skin: Skin is warm and dry.  Psychiatric: Cooperative  ED Course  Procedures (including critical care time) Labs Review Labs Reviewed  COMPREHENSIVE METABOLIC PANEL - Abnormal; Notable for the following:    Sodium 128 (*)    Chloride 96 (*)    Glucose, Bld 130 (*)    Creatinine, Ser 1.19 (*)    Calcium 8.6 (*)    GFR calc non Af Amer 44 (*)    GFR calc Af Amer 51 (*)    All other components within normal limits  CBC - Abnormal; Notable for the following:    RBC 3.78 (*)     Hemoglobin 11.6 (*)    HCT 34.8 (*)    RDW 16.6 (*)    All other components within normal limits  URINALYSIS, ROUTINE W REFLEX MICROSCOPIC (NOT AT [facility]) - Abnormal; Notable for the following:    Hgb urine dipstick TRACE (*)    Leukocytes, UA SMALL (*)    All other components within normal limits  SEDIMENTATION RATE - Abnormal; Notable for the following:    Sed Rate 23 (*)    All other components within normal limits  URINE MICROSCOPIC-ADD ON - Abnormal; Notable for the following:    Squamous Epithelial / LPF 0-5 (*)    Bacteria, UA FEW (*)    All other components within normal limits  LIPASE, BLOOD  TROPONIN I  C-REACTIVE PROTEIN  D-DIMER, QUANTITATIVE (NOT AT [facility])  DIFFERENTIAL    Imaging Review Dg Chest 2 View  05/30/2015  CLINICAL DATA:  Pt c/o left sided chest pain that goes down left arm and SOB x3days. HTN, non smoker. EXAM: CHEST  2 VIEW COMPARISON:  05/03/2011 FINDINGS: Mildly prominent epicardial adipose tissue and borderline enlargement of the cardiopericardial silhouette. No edema. The lungs appear clear. No significant bony abnormalities. IMPRESSION: 1. Borderline enlargement of the cardiopericardial silhouette. Otherwise, no significant abnormalities are observed. Electronically Signed   By: Van Clines M.D.   On: 05/30/2015 16:25   Ct Abdomen Pelvis W Contrast  05/30/2015  CLINICAL DATA:  Generalized and low abdominal pain for 3 days. Nausea. History of ulcerative colitis. EXAM: CT ABDOMEN AND PELVIS WITH CONTRAST TECHNIQUE: Multidetector CT imaging of the abdomen and pelvis was performed using the standard protocol following bolus administration of intravenous contrast. CONTRAST:  31m OMNIPAQUE IOHEXOL 300 MG/ML  SOLN COMPARISON:  CT 03/11/2013 FINDINGS: Lower chest: Scattered linear atelectasis in both lower lobes. No pleural effusion. Borderline cardiomegaly. Coronary artery calcifications are seen. Liver: Tiny hypodensity in the right lobe, unchanged from prior  exam. No new or suspicious hepatic lead Hepatobiliary: Postcholecystectomy with unchanged biliary prominence. Pancreas: No ductal dilatation or inflammation. Small 7 mm hypodense lesion in the head of the pancreas is unchanged from prior exam, may be a side branch IPMN.  Spleen: Normal. Adrenal glands: No nodule. Kidneys: Symmetric renal enhancement. No hydronephrosis. Small hypodense lesions in the left kidney, too small to characterize but unchanged. Stomach/Bowel: Stomach physiologically distended. Small hiatal hernia. There are no dilated or thickened small bowel loops. Scattered wall thickening involving the sigmoid colon with minimal adjacent inflammation. Remainder of the colon is contrast filled. Multifocal colonic diverticulosis without diverticulitis. Vascular/Lymphatic: No retroperitoneal adenopathy. Abdominal aorta is normal in caliber. Mild atherosclerosis without aneurysm. Reproductive: Post hysterectomy.  No adnexal mass. Bladder: Minimally distended, no wall thickening allowing for degree of distension. Other: No free air, free fluid, or intra-abdominal fluid collection. Musculoskeletal: There are no acute or suspicious osseous abnormalities. There is a prominent Schmorl's node in superior endplate of L3 with associated disc space narrowing that is new from prior CT of 2014. IMPRESSION: 1. Mild active colitis of the sigmoid colon, likely inflammatory given history of ulcerative colitis. 2. Small hypodense lesion in the head of the pancreas measures 7 mm. This is unchanged from exam within 2 years prior and likely benign/side branch IPMN. No ductal dilatation. Nonemergent pancreatic protocol MRI could be considered for further characterization. Electronically Signed   By: Jeb Levering M.D.   On: 05/30/2015 20:25   I have personally reviewed and evaluated these images and lab results as part of my medical decision-making.   EKG Interpretation   Date/Time:  Thursday May 30 2015 15:10:32  EST Ventricular Rate:  94 PR Interval:  164 QRS Duration: 88 QT Interval:  336 QTC Calculation: 420 R Axis:   84 Text Interpretation:  Sinus rhythm Consider left atrial enlargement  Borderline right axis deviation Low voltage, precordial leads Borderline T  abnormalities, anterior leads No significant change since last tracing  Confirmed by Bralynn Velador MD, Kevion Fatheree ([id number]) on 05/30/2015 4:32:46 PM      MDM   Final diagnoses:  Lower abdominal pain  Colitis   Chest pain seems MSK in nature, reproduced with movement and palpation. No PE/DVT risk factors and d-dimer is negative; felt ruled out for PE. History and exam not concern for dissection or other serious intra-thoracic etiologies. CXR without infiltrate, widened mediastinum, edema, or other acute processes. Troponin negative and EKG non-ischemic. History non-concerning for that of ACS and heart score of 3 for HTN and age.   In regards to abdominal pain, gradually worsening and consistent with prior UC flares. Abd soft and benign with suprapubic tenderness primarily. Unremarakble CBC, CmP, lipase and UA. CT performed of abd/pelvis with evidence of colitis in the sigmoid colon. Suggestive of mild ulcerative colitis flare. Minimally elevated ESR of 24 with normal CRP and no other systemic signs or symptoms of illness to require hospitalization for flare. I discussed this patient with her gastroenterologist, Dr. [name] who recommended steroid burst with taper and continued outpatient follow-up.   Strict return and follow-up instructions reviewed. She expressed understanding of all discharge instructions and felt comfortable with the plan of care.     Forde Dandy, MD 05/31/15 [phone]

## 2015-05-30 NOTE — Discharge Instructions (Signed)
Your CT scan does show evidence of inflammation of the colon that could be due to ulcerative colitis flare. I spoke to your gastroenterologist, who recommended that you can increase her steroid taper. Return without fail for worsening symptoms, including fever, worsening pain, vomiting and unable to keep down food or fluids, or any other symptoms concerning to you. Please call your GI physician for close follow-up.  Colitis Colitis is inflammation of the colon. Colitis may last a short time (acute) or it may last a long time (chronic). CAUSES This condition may be caused by:  Viruses.  Bacteria.  Reactions to medicine.  Certain autoimmune diseases, such as Crohn disease or ulcerative colitis. SYMPTOMS Symptoms of this condition include:  Diarrhea.  Passing bloody or tarry stool.  Pain.  Fever.  Vomiting.  Tiredness (fatigue).  Weight loss.  Bloating.  Sudden increase in abdominal pain.  Having fewer bowel movements than usual. DIAGNOSIS This condition is diagnosed with a stool test or a blood test. You may also have other tests, including X-rays, a CT scan, or a colonoscopy. TREATMENT Treatment may include:  Resting the bowel. This involves not eating or drinking for a period of time.  Fluids that are given through an IV tube.  Medicine for pain and diarrhea.  Antibiotic medicines.  Cortisone medicines.  Surgery. HOME CARE INSTRUCTIONS Eating and Drinking  Follow instructions from your health care provider about eating or drinking restrictions.  Drink enough fluid to keep your urine clear or pale yellow.  Work with a dietitian to determine which foods cause your condition to flare up.  Avoid foods that cause flare-ups.  Eat a well-balanced diet. Medicines  Take over-the-counter and prescription medicines only as told by your health care provider.  If you were prescribed an antibiotic medicine, take it as told by your health care provider. Do not  stop taking the antibiotic even if you start to feel better. General Instructions  Keep all follow-up visits as told by your health care provider. This is important. SEEK MEDICAL CARE IF:  Your symptoms do not go away.  You develop new symptoms. SEEK IMMEDIATE MEDICAL CARE IF:  You have a fever that does not go away with treatment.  You develop chills.  You have extreme weakness, fainting, or dehydration.  You have repeated vomiting.  You develop severe pain in your abdomen.  You pass bloody or tarry stool.   This information is not intended to replace advice given to you by your health care provider. Make sure you discuss any questions you have with your health care provider.   Document Released: 04/16/2004 Document Revised: 11/28/2014 Document Reviewed: 07/02/2014 Elsevier Interactive Patient Education [employer].

## 2015-05-30 NOTE — ED Notes (Signed)
MD at bedside. 

## 2015-05-31 ENCOUNTER — Telehealth (INDEPENDENT_AMBULATORY_CARE_PROVIDER_SITE_OTHER): Payer: Self-pay | Admitting: Internal Medicine

## 2015-05-31 NOTE — Telephone Encounter (Signed)
Dr.Rehman states he talked with Ed physician. These are his recommendations. Patient was made aware.

## 2015-05-31 NOTE — Telephone Encounter (Signed)
Ms. Ciani called to inform us that she was in the ER at [facility] yesterday. She wants Dr. [name] to review the notes. She did say she feels about the same even after having taken her medication. She was instructed to increase the steroid to 30 and she's done that but feels the same. Please call the pt if needed.  Pt's ph# [phone] Thank you.

## 2015-06-01 LAB — TOXASSURE SELECT 13 (MW), URINE: PDF: 0

## 2015-06-04 ENCOUNTER — Other Ambulatory Visit: Payer: Self-pay | Admitting: Nurse Practitioner

## 2015-06-05 NOTE — Telephone Encounter (Signed)
Last seen 05/27/15  MMM If approved route to nurse to call into [facility]  [phone]

## 2015-06-06 NOTE — Telephone Encounter (Signed)
Please call in xanax with 1 refills 

## 2015-06-06 NOTE — Telephone Encounter (Signed)
rx called into pharmacy

## 2015-06-17 ENCOUNTER — Telehealth: Payer: Self-pay | Admitting: Nurse Practitioner

## 2015-06-17 DIAGNOSIS — K5150 Left sided colitis without complications: Secondary | ICD-10-CM

## 2015-06-17 NOTE — Telephone Encounter (Signed)
The pharmacy was checking on the amount of oxycodone that is to be given patient states that she thought she was to get 60 a month but rx's were written for only 30. Pharmacy states this is for May and June rx's patient does not need now but wanted to go ahead and get it corrected if it needed to be.

## 2015-06-18 MED ORDER — OXYCODONE HCL 5 MG PO TABS: 5.0000 mg | ORAL_TABLET | ORAL | Status: DC | PRN

## 2015-06-18 NOTE — Telephone Encounter (Signed)
Pharmacy aware rx ready to be picked up. Pharmacy shredded the previous rx's

## 2015-06-18 NOTE — Telephone Encounter (Signed)
Tell [name] i am so sorry- i corrected rx- please bring incorrect one back if you can

## 2015-06-25 ENCOUNTER — Ambulatory Visit (INDEPENDENT_AMBULATORY_CARE_PROVIDER_SITE_OTHER): Payer: Medicare Other | Admitting: Internal Medicine

## 2015-06-25 ENCOUNTER — Encounter (INDEPENDENT_AMBULATORY_CARE_PROVIDER_SITE_OTHER): Payer: Self-pay | Admitting: Internal Medicine

## 2015-06-25 VITALS — BP 128/80 | HR 68 | Temp 97.8°F | Resp 18 | Ht 68.5 in | Wt 158.9 lb

## 2015-06-25 DIAGNOSIS — K51919 Ulcerative colitis, unspecified with unspecified complications: Secondary | ICD-10-CM | POA: Diagnosis not present

## 2015-06-25 MED ORDER — PREDNISONE 5 MG PO TABS: 5.0000 mg | ORAL_TABLET | Freq: Every day | ORAL | Status: DC

## 2015-06-25 MED ORDER — METHOTREXATE 2.5 MG PO TABS: 12.5000 mg | ORAL_TABLET | ORAL | Status: DC

## 2015-06-25 NOTE — Patient Instructions (Signed)
Continue prednisone taper until you get down to 5 mg. Stay on 5 mg prednisone daily until office visit in 3 months.

## 2015-06-25 NOTE — Progress Notes (Signed)
Presenting complaint;  Follow-up for ulcerative colitis.  Subjective:  Patient is 75 year old Caucasian female was chronic ulcerative colitis and is here for scheduled visit. She was last seen 3 months ago. She was seen in emergency room on 05/30/2015 for lower abdominal pain. CT revealed thickening to distal half of the colon. Her pain was felt to be due to flareup of for UC and she was discharged on prednisone. She did have CRP of .6 and sedimentation rate of 23. She has been tapering prednisone as recommended. She is feeling better. She is having 3-4 bowel movements per day. 75% of her stools are formed. She is seeing small amount of blood every now and then. She denies nocturnal bowel movements. She feels methotrexate is helping but she has noted back pain as well as burning in her mouth. She has very good appetite. She has scant 4 pounds since her last visit. She continues to have pain in her right hand and upper extremities. Her pain is not being managed Y pain clinic.   Current Medications: Outpatient Encounter Prescriptions as of 06/25/2015  Medication Sig  . ALPRAZolam (XANAX) 0.5 MG tablet TAKE 1 TABLET BY MOUTH TWICE DAILY AS NEEDED FOR ANXIETY.  Marland Kitchen antiseptic oral rinse (BIOTENE) LIQD 1 application by Mouth Rinse route as needed for dry mouth. FOR DRY MOUTH  . B Complex CAPS Take 1 capsule by mouth every morning.   . benazepril-hydrochlorthiazide (LOTENSIN HCT) 20-12.5 MG tablet Take 1 tablet by mouth daily.  . Cholecalciferol (VITAMIN D3) 1000 UNITS CAPS Take 1 capsule by mouth. Patient states that she alternates with Hair, Skin, and Nails.  Marland Kitchen co-enzyme Q-10 50 MG capsule Take 50 mg by mouth once a week.   . dicyclomine (BENTYL) 20 MG tablet Take 0.5 tablets (10 mg total) by mouth 2 (two) times daily before a meal.  . diphenhydrAMINE (BENADRYL) 25 MG tablet Take 25 mg by mouth at bedtime as needed for sleep.  . hydrocortisone (PROCTOSOL HC) 2.5 % rectal cream Place 1 application  rectally 2 (two) times daily.  Marland Kitchen loperamide (IMODIUM) 2 MG capsule Take 1 capsule (2 mg total) by mouth every 8 (eight) hours as needed for diarrhea or loose stools.  . methotrexate (RHEUMATREX) 2.5 MG tablet TAKE 6 TABLETS BY MOUTH EVERY 7 DAYS.  Marland Kitchen Multiple Vitamins-Minerals (WOMENS MULTI VITAMIN & MINERAL PO) Take 1 tablet by mouth every other day.   . Omega-3 Fatty Acids (FISH OIL) 1000 MG CPDR Take 1 capsule by mouth every evening.   . ondansetron (ZOFRAN) 4 MG tablet Take 1 tablet (4 mg total) by mouth 2 (two) times daily as needed for nausea or vomiting.  Marland Kitchen oxyCODONE (OXY IR/ROXICODONE) 5 MG immediate release tablet Take 1 tablet (5 mg total) by mouth 2 (two) times daily. (Patient taking differently: Take 2.5-5 mg by mouth 2 (two) times daily. )  . predniSONE (DELTASONE) 5 MG tablet Take 6 tablets (30 mg total) by mouth daily with breakfast. Take 30 mg daily for 7 days, then take 25 mg daily for 7 days, then take 20 mg daily for 7 days, then take 15 mg daily for 7 days, and then take 10 mg daily until you follow-up with your GI doctor  . Probiotic Product (PROBIOTIC FORMULA PO) Take 1-2 capsules by mouth every morning.   . vitamin A 10000 UNIT capsule Take 10,000 Units by mouth once a week.   . vitamin C (ASCORBIC ACID) 500 MG tablet Take 500 mg by mouth once a week.   Marland Kitchen  vitamin E 400 UNIT capsule Take 400 Units by mouth every morning.   . zolpidem (AMBIEN) 5 MG tablet TAKE 1 TABLET AT BEDTIME AS NEEDED.  . [DISCONTINUED] oxyCODONE (ROXICODONE) 5 MG immediate release tablet Take 1 tablet (5 mg total) by mouth every 4 (four) hours as needed for severe pain.  . [DISCONTINUED] oxyCODONE (ROXICODONE) 5 MG immediate release tablet Take 1 tablet (5 mg total) by mouth every 4 (four) hours as needed for severe pain. (Patient not taking: Reported on 06/25/2015)  . [DISCONTINUED] predniSONE (DELTASONE) 5 MG tablet Take 5 mg by mouth daily with breakfast. Reported on 06/25/2015   No facility-administered  encounter medications on file as of 06/25/2015.     Objective: Blood pressure 128/80, pulse 68, temperature 97.8 F (36.6 C), temperature source Oral, resp. rate 18, height 5' 8.5" (1.74 m), weight 158 lb 14.4 oz (72.077 kg). Patient is alert and in no acute distress. Conjunctiva is pink. Sclera is nonicteric Oropharyngeal mucosa is normal. No neck masses or thyromegaly noted. Cardiac exam with regular rhythm normal S1 and S2. No murmur or gallop noted. Lungs are clear to auscultation. Abdomen is symmetrical. On palpation is soft with mild tenderness in LLQ in hypogastric area. No organomegaly or masses. No LE edema or clubbing noted.  Labs/studies Results: Lab data from 05/30/2015  WBC 8.9, H&H 11.6 and 34.8 and platelet count 371K  Serum sodium 128, potassium 4.1, chloride 96, CO2 25, glucose 1:30, BUN 17, creatinine 1.19  Serum calcium 8.6  Bilirubin 0.8, AP 45, AST 35, ALT 28, total protein 6.7 and albumin 3.9.   CRP 0.6. Sedimentation rate 23(0-22).  Abdominopelvic CT films from 05/30/2015 reviewed with patient.  Assessment:  #1. Distal ulcerative colitis. She has improved symptomaticallybut does not appear to be ine.she is having side effects with methotrexate. Therefore will drop dose by 2.5 mg. patient's condition has been difficult to treat and she has not achieved sustained remission with multiple treatment modalities. She has declined surgery. #2. Hyponatremia possibly diuretic therapy.  Plan:  Decrease methotrexate to 12.5 mg by mouth every week. Continue prednisone taper by 5 mg every week. Once she is down to 5 mg per day she will continue that dose until office visit in 3 months.  Metabolic-7 next week.

## 2015-07-01 ENCOUNTER — Telehealth (INDEPENDENT_AMBULATORY_CARE_PROVIDER_SITE_OTHER): Payer: Self-pay | Admitting: *Deleted

## 2015-07-01 DIAGNOSIS — K51918 Ulcerative colitis, unspecified with other complication: Secondary | ICD-10-CM

## 2015-07-01 NOTE — Telephone Encounter (Signed)
Per Dr.Rehman the patient will need to have labs drawn the week of July 01 2015.

## 2015-07-02 ENCOUNTER — Other Ambulatory Visit: Payer: Medicare Other

## 2015-07-02 DIAGNOSIS — K51918 Ulcerative colitis, unspecified with other complication: Secondary | ICD-10-CM

## 2015-07-03 LAB — BMP8+EGFR
BUN / CREAT RATIO: 21 (ref 12–28)
BUN: 30 mg/dL — AB (ref 8–27)
CO2: 23 mmol/L (ref 18–29)
CREATININE: 1.45 mg/dL — AB (ref 0.57–1.00)
Calcium: 9.2 mg/dL (ref 8.7–10.3)
Chloride: 100 mmol/L (ref 96–106)
GFR calc non Af Amer: 36 mL/min/{1.73_m2} — ABNORMAL LOW (ref >59)
GFR, EST AFRICAN AMERICAN: 41 mL/min/{1.73_m2} — AB (ref >59)
Glucose: 119 mg/dL — ABNORMAL HIGH (ref 65–99)
Potassium: 4.8 mmol/L (ref 3.5–5.2)
Sodium: 142 mmol/L (ref 134–144)

## 2015-07-07 ENCOUNTER — Other Ambulatory Visit (INDEPENDENT_AMBULATORY_CARE_PROVIDER_SITE_OTHER): Payer: Self-pay | Admitting: Internal Medicine

## 2015-07-07 DIAGNOSIS — K51919 Ulcerative colitis, unspecified with unspecified complications: Secondary | ICD-10-CM

## 2015-07-07 MED ORDER — METHOTREXATE 2.5 MG PO TABS: 10.0000 mg | ORAL_TABLET | ORAL | Status: DC

## 2015-07-31 ENCOUNTER — Other Ambulatory Visit (INDEPENDENT_AMBULATORY_CARE_PROVIDER_SITE_OTHER): Payer: Self-pay | Admitting: Internal Medicine

## 2015-07-31 ENCOUNTER — Other Ambulatory Visit: Payer: Self-pay | Admitting: Nurse Practitioner

## 2015-08-01 NOTE — Telephone Encounter (Signed)
Patient of [facility]. Last office visit was 05-27-15. Rx last filled on 3-25 for #60. Please advise. If approved please route to Pool A so nurse can phone in to pharmacy

## 2015-08-06 ENCOUNTER — Encounter: Payer: Self-pay | Admitting: Nurse Practitioner

## 2015-08-06 ENCOUNTER — Ambulatory Visit (INDEPENDENT_AMBULATORY_CARE_PROVIDER_SITE_OTHER): Payer: Medicare Other | Admitting: Nurse Practitioner

## 2015-08-06 VITALS — BP 116/77 | HR 82 | Temp 97.0°F | Ht 68.5 in | Wt 162.0 lb

## 2015-08-06 DIAGNOSIS — E119 Type 2 diabetes mellitus without complications: Secondary | ICD-10-CM

## 2015-08-06 DIAGNOSIS — G4700 Insomnia, unspecified: Secondary | ICD-10-CM

## 2015-08-06 DIAGNOSIS — R110 Nausea: Secondary | ICD-10-CM

## 2015-08-06 DIAGNOSIS — I10 Essential (primary) hypertension: Secondary | ICD-10-CM | POA: Diagnosis not present

## 2015-08-06 DIAGNOSIS — K5150 Left sided colitis without complications: Secondary | ICD-10-CM | POA: Diagnosis not present

## 2015-08-06 DIAGNOSIS — E1310 Other specified diabetes mellitus with ketoacidosis without coma: Secondary | ICD-10-CM | POA: Diagnosis not present

## 2015-08-06 DIAGNOSIS — E1110 Type 2 diabetes mellitus with ketoacidosis without coma: Secondary | ICD-10-CM | POA: Insufficient documentation

## 2015-08-06 DIAGNOSIS — F329 Major depressive disorder, single episode, unspecified: Secondary | ICD-10-CM

## 2015-08-06 DIAGNOSIS — E785 Hyperlipidemia, unspecified: Secondary | ICD-10-CM | POA: Diagnosis not present

## 2015-08-06 DIAGNOSIS — F32A Depression, unspecified: Secondary | ICD-10-CM

## 2015-08-06 LAB — BAYER DCA HB A1C WAIVED: HB A1C: 6.3 % (ref <7.0)

## 2015-08-06 MED ORDER — ALPRAZOLAM 0.5 MG PO TABS: 0.5000 mg | ORAL_TABLET | Freq: Two times a day (BID) | ORAL | Status: DC | PRN

## 2015-08-06 MED ORDER — ZOLPIDEM TARTRATE 5 MG PO TABS: ORAL_TABLET | ORAL | Status: DC

## 2015-08-06 MED ORDER — OXYCODONE HCL 5 MG PO TABS: 5.0000 mg | ORAL_TABLET | Freq: Two times a day (BID) | ORAL | Status: DC

## 2015-08-06 MED ORDER — BENAZEPRIL-HYDROCHLOROTHIAZIDE 20-12.5 MG PO TABS: 1.0000 | ORAL_TABLET | Freq: Every day | ORAL | Status: DC

## 2015-08-06 MED ORDER — ONDANSETRON HCL 4 MG PO TABS: 4.0000 mg | ORAL_TABLET | Freq: Two times a day (BID) | ORAL | Status: DC | PRN

## 2015-08-06 NOTE — Patient Instructions (Signed)
Stress and Stress Management Stress is a normal reaction to life events. It is what you feel when life demands more than you are used to or more than you can handle. Some stress can be useful. For example, the stress reaction can help you catch the last bus of the day, study for a test, or meet a deadline at work. But stress that occurs too often or for too long can cause problems. It can affect your emotional health and interfere with relationships and normal daily activities. Too much stress can weaken your immune system and increase your risk for physical illness. If you already have a medical problem, stress can make it worse. CAUSES  All sorts of life events may cause stress. An event that causes stress for one person may not be stressful for another person. Major life events commonly cause stress. These may be positive or negative. Examples include losing your job, moving into a new home, getting married, having a baby, or losing a loved one. Less obvious life events may also cause stress, especially if they occur day after day or in combination. Examples include working long hours, driving in traffic, caring for children, being in debt, or being in a difficult relationship. SIGNS AND SYMPTOMS Stress may cause emotional symptoms including, the following:  Anxiety. This is feeling worried, afraid, on edge, overwhelmed, or out of control.  Anger. This is feeling irritated or impatient.  Depression. This is feeling sad, down, helpless, or guilty.  Difficulty focusing, remembering, or making decisions. Stress may cause physical symptoms, including the following:   Aches and pains. These may affect your head, neck, back, stomach, or other areas of your body.  Tight muscles or clenched jaw.  Low energy or trouble sleeping. Stress may cause unhealthy behaviors, including the following:   Eating to feel better (overeating) or skipping meals.  Sleeping too little, too much, or both.  Working  too much or putting off tasks (procrastination).  Smoking, drinking alcohol, or using drugs to feel better. DIAGNOSIS  Stress is diagnosed through an assessment by your health care provider. Your health care provider will ask questions about your symptoms and any stressful life events.Your health care provider will also ask about your medical history and may order blood tests or other tests. Certain medical conditions and medicine can cause physical symptoms similar to stress. Mental illness can cause emotional symptoms and unhealthy behaviors similar to stress. Your health care provider may refer you to a mental health professional for further evaluation.  TREATMENT  Stress management is the recommended treatment for stress.The goals of stress management are reducing stressful life events and coping with stress in healthy ways.  Techniques for reducing stressful life events include the following:  Stress identification. Self-monitor for stress and identify what causes stress for you. These skills may help you to avoid some stressful events.  Time management. Set your priorities, keep a calendar of events, and learn to say "no." These tools can help you avoid making too many commitments. Techniques for coping with stress include the following:  Rethinking the problem. Try to think realistically about stressful events rather than ignoring them or overreacting. Try to find the positives in a stressful situation rather than focusing on the negatives.  Exercise. Physical exercise can release both physical and emotional tension. The key is to find a form of exercise you enjoy and do it regularly.  Relaxation techniques. These relax the body and mind. Examples include yoga, meditation, tai chi, biofeedback, deep  breathing, progressive muscle relaxation, listening to music, being out in nature, journaling, and other hobbies. Again, the key is to find one or more that you enjoy and can do  regularly.  Healthy lifestyle. Eat a balanced diet, get plenty of sleep, and do not smoke. Avoid using alcohol or drugs to relax.  Strong support network. Spend time with family, friends, or other people you enjoy being around.Express your feelings and talk things over with someone you trust. Counseling or talktherapy with a mental health professional may be helpful if you are having difficulty managing stress on your own. Medicine is typically not recommended for the treatment of stress.Talk to your health care provider if you think you need medicine for symptoms of stress. HOME CARE INSTRUCTIONS  Keep all follow-up visits as directed by your health care provider.  Take all medicines as directed by your health care provider. SEEK MEDICAL CARE IF:  Your symptoms get worse or you start having new symptoms.  You feel overwhelmed by your problems and can no longer manage them on your own. SEEK IMMEDIATE MEDICAL CARE IF:  You feel like hurting yourself or someone else.   This information is not intended to replace advice given to you by your health care provider. Make sure you discuss any questions you have with your health care provider.   Document Released: 09/02/2000 Document Revised: 03/30/2014 Document Reviewed: 11/01/2012 Elsevier Interactive Patient Education 2016 [employer]  

## 2015-08-06 NOTE — Progress Notes (Signed)
Subjective:    Patient ID: [name], female    DOB: [birth year], 75 y.o.   MRN: [id number]  Patient here today for follow up of chronic medical problems. No complaints today  Hypertension This is a chronic problem. The current episode started more than 1 year ago. The problem is unchanged. The problem is controlled. Pertinent negatives include no chest pain, headaches, neck pain, palpitations or shortness of breath. Risk factors for coronary artery disease include dyslipidemia, post-menopausal state and sedentary lifestyle. Past treatments include ACE inhibitors and diuretics. The current treatment provides moderate improvement. Compliance problems include diet and exercise.  There is no history of CAD/MI, CVA, heart failure or PVD.  Hyperlipidemia This is a chronic problem. The current episode started more than 1 year ago. The problem is controlled. She has no history of diabetes, hypothyroidism or obesity. Pertinent negatives include no chest pain or shortness of breath. She is currently on no antihyperlipidemic treatment. The current treatment provides moderate improvement of lipids. Compliance problems include adherence to diet and adherence to exercise.  Risk factors for coronary artery disease include dyslipidemia, hypertension and post-menopausal.  Diabetes She presents for her follow-up diabetic visit. She has type 2 diabetes mellitus. Pertinent negatives for hypoglycemia include no headaches. Pertinent negatives for diabetes include no chest pain. Symptoms are stable. Pertinent negatives for diabetic complications include no CVA or PVD. Her weight is stable. When asked about meal planning, she reported none. She has not had a previous visit with a dietitian. She rarely participates in exercise. Home blood sugar record trend: only checks occassionally. Her breakfast blood glucose range is generally 130-140 mg/dl. Her overall blood glucose range is 130-140 mg/dl. An ACE inhibitor/angiotensin  II receptor blocker is being taken. She does not see a podiatrist.Eye exam is not current.  ulcerative colitis Dr. [name]- curremtly coming off of  prednisone- Was on Humira which was not working so they have put her on methotreazate yesterday. Takes pain medication for the constant pain. Was seen for pain management 05/27/15 and was given rx to get last refill on 09/12/15. Drepression/GAD lexapor- working well to keep her from worrying so much. Takes xanax as needed at least BID- insurance no longer wants to pay for xanax so need to change. insomnia ambien but only takes occasionally- uses benadryl most of the time.   Review of Systems  HENT: Negative.   Respiratory: Negative for shortness of breath.   Cardiovascular: Negative for chest pain and palpitations.  Gastrointestinal: Positive for nausea, diarrhea and constipation.  Musculoskeletal: Negative for neck pain.  Neurological: Negative for headaches.  Psychiatric/Behavioral: Negative.   All other systems reviewed and are negative.      Objective:   Physical Exam  Constitutional: She is oriented to person, place, and time. She appears well-developed and well-nourished.  HENT:  Nose: Nose normal.  Mouth/Throat: Oropharynx is clear and moist.  Eyes: EOM are normal.  Neck: Trachea normal, normal range of motion and full passive range of motion without pain. Neck supple. No JVD present. Carotid bruit is not present. No thyromegaly present.  Cardiovascular: Normal rate, regular rhythm, normal heart sounds and intact distal pulses.  Exam reveals no gallop and no friction rub.   No murmur heard. Pulmonary/Chest: Effort normal and breath sounds normal.  Abdominal: Soft. Bowel sounds are normal. She exhibits no distension and no mass. There is tenderness (diffuse).  Musculoskeletal: Normal range of motion.  Lymphadenopathy:    She has no cervical adenopathy.  Neurological: She is alert  and oriented to person, place, and time. She has  normal reflexes.  Skin: Skin is warm and dry.  Psychiatric: She has a normal mood and affect. Her behavior is normal. Judgment and thought content normal.   BP 116/77 mmHg  Pulse 82  Temp(Src) 97 F (36.1 C) (Oral)  Ht 5' 8.5" (1.74 m)  Wt 162 lb (73.483 kg)  BMI 24.27 kg/m2  HGBA1C 6.3% down from 6.3% last visit      Assessment & Plan:  1. Essential hypertension Do not add salt to diet - CMP14+EGFR - benazepril-hydrochlorthiazide (LOTENSIN HCT) 20-12.5 MG tablet; Take 1 tablet by mouth daily.  Dispense: 30 tablet; Refill: 5  2. Hyperlipidemia with target LDL less than 100 Low fat diet - Lipid panel  3. type 2 diabetes mellitus with ketoacidosis without coma, without long-term current use of insulin ([place]) Continue  To watch carbs in diet - Bayer DCA Hb A1c Waived - Microalbumin / creatinine urine ratio  4. ULCERATIVE COLITIS, LEFT SIDED Keep follow up appointment with Dr. [name] - oxyCODONE (OXY IR/ROXICODONE) 5 MG immediate release tablet; Take 1 tablet (5 mg total) by mouth 2 (two) times daily.  Dispense: 60 tablet; Refill: 0 - ALPRAZolam (XANAX) 0.5 MG tablet; Take 1 tablet (0.5 mg total) by mouth 2 (two) times daily as needed for anxiety.  Dispense: 60 tablet; Refill: 2  5. Insomnia Bedtime ritual - zolpidem (AMBIEN) 5 MG tablet; TAKE 1 TABLET AT BEDTIME AS NEEDED.  Dispense: 30 tablet; Refill: 2  6. Depression Stress management reviewed  7. Nausea without vomiting - ondansetron (ZOFRAN) 4 MG tablet; Take 1 tablet (4 mg total) by mouth 2 (two) times daily as needed for nausea or vomiting.  Dispense: 20 tablet; Refill: 1    Labs pending Health maintenance reviewed Diet and exercise encouraged Continue all meds Follow up  In 3 month   [place], FNP

## 2015-08-07 LAB — LIPID PANEL
CHOL/HDL RATIO: 4.2 ratio (ref 0.0–4.4)
CHOLESTEROL TOTAL: 232 mg/dL — AB (ref 100–199)
HDL: 55 mg/dL (ref >39)
LDL CALC: 144 mg/dL — AB (ref 0–99)
Triglycerides: 163 mg/dL — ABNORMAL HIGH (ref 0–149)
VLDL CHOLESTEROL CAL: 33 mg/dL (ref 5–40)

## 2015-08-07 LAB — CMP14+EGFR
ALBUMIN: 4.1 g/dL (ref 3.5–4.8)
ALT: 17 IU/L (ref 0–32)
AST: 22 IU/L (ref 0–40)
Albumin/Globulin Ratio: 1.9 (ref 1.2–2.2)
Alkaline Phosphatase: 61 IU/L (ref 39–117)
BUN / CREAT RATIO: 22 (ref 12–28)
BUN: 27 mg/dL (ref 8–27)
Bilirubin Total: 0.4 mg/dL (ref 0.0–1.2)
CALCIUM: 8.9 mg/dL (ref 8.7–10.3)
CO2: 24 mmol/L (ref 18–29)
CREATININE: 1.24 mg/dL — AB (ref 0.57–1.00)
Chloride: 99 mmol/L (ref 96–106)
GFR, EST AFRICAN AMERICAN: 50 mL/min/{1.73_m2} — AB (ref >59)
GFR, EST NON AFRICAN AMERICAN: 43 mL/min/{1.73_m2} — AB (ref >59)
GLOBULIN, TOTAL: 2.2 g/dL (ref 1.5–4.5)
GLUCOSE: 176 mg/dL — AB (ref 65–99)
Potassium: 4.6 mmol/L (ref 3.5–5.2)
SODIUM: 140 mmol/L (ref 134–144)
TOTAL PROTEIN: 6.3 g/dL (ref 6.0–8.5)

## 2015-08-07 LAB — MICROALBUMIN / CREATININE URINE RATIO
CREATININE, UR: 128.9 mg/dL
MICROALB/CREAT RATIO: 22.3 mg/g creat (ref 0.0–30.0)
MICROALBUM., U, RANDOM: 28.8 ug/mL

## 2015-10-08 ENCOUNTER — Encounter (INDEPENDENT_AMBULATORY_CARE_PROVIDER_SITE_OTHER): Payer: Self-pay | Admitting: Internal Medicine

## 2015-10-08 ENCOUNTER — Ambulatory Visit (INDEPENDENT_AMBULATORY_CARE_PROVIDER_SITE_OTHER): Payer: Medicare Other | Admitting: Internal Medicine

## 2015-10-08 VITALS — BP 120/80 | HR 68 | Temp 97.4°F | Resp 18 | Ht 68.5 in | Wt 163.0 lb

## 2015-10-08 DIAGNOSIS — R1030 Lower abdominal pain, unspecified: Secondary | ICD-10-CM

## 2015-10-08 DIAGNOSIS — K51918 Ulcerative colitis, unspecified with other complication: Secondary | ICD-10-CM | POA: Diagnosis not present

## 2015-10-08 NOTE — Patient Instructions (Signed)
Physician will call with results of blood tests further recommendations. Notify if abdominal pain gets worse or if you have temp greater than 100 F

## 2015-10-08 NOTE — Progress Notes (Signed)
Presenting complaint;  Follow-up for ulcerative colitis. Patient complains of LLQ abdominal pain.  Subjective:  Patient is 75 year old Caucasian female who is here for scheduled visit. She was last seen on 06/25/2015. She has history of distal ulcerative colitis which has been refractory to therapy. She is currently on methotrexate and oral mesalamine. She says this is the best she has felt in a long time. She is having 2-3 stools per day instead of 10 or 12. She is not having any nocturnal bowel movements. Her stools are either soft or formed. She has not passed any blood per rectum since her office visit over 3 months ago. She also has less urgency. She is able to go out and eat at a restaurant and not afraid that she would have an accident. She says methotrexate makes her slightly edition tired for about 2 days. She is not having any other side effects. She says she's been having LLQ abdominal pain for 1 week. She also feels she has more gas. She has had chills but no fever hematuria or dysuria. She also has back pain. She is not sure if he's having 2 different pains are back pain referred to her site. Her appetite is good. She has gained 5 ounces at last visit. She is presently on 5 mg of prednisone per day. She also has rheumatoid arthritis. She has pain in multiple joints including both hands. She goes to pain clinic. She is aware that she cannot take NSAIDs.     Current Medications: Outpatient Encounter Prescriptions as of 10/08/2015  Medication Sig  . ALPRAZolam (XANAX) 0.5 MG tablet Take 1 tablet (0.5 mg total) by mouth 2 (two) times daily as needed for anxiety.  Marland Kitchen antiseptic oral rinse (BIOTENE) LIQD 1 application by Mouth Rinse route daily. FOR DRY MOUTH  . B Complex CAPS Take 1 capsule by mouth every morning.   . benazepril-hydrochlorthiazide (LOTENSIN HCT) 20-12.5 MG tablet Take 1 tablet by mouth daily.  . Cholecalciferol (VITAMIN D3) 1000 UNITS CAPS Take 1 capsule by mouth. Patient  states that she alternates with Hair, Skin, and Nails.  Marland Kitchen co-enzyme Q-10 50 MG capsule Take 50 mg by mouth once a week.   . dicyclomine (BENTYL) 20 MG tablet Take 0.5 tablets (10 mg total) by mouth 2 (two) times daily before a meal.  . hydrocortisone (PROCTOSOL HC) 2.5 % rectal cream Place 1 application rectally 2 (two) times daily.  . methotrexate (RHEUMATREX) 2.5 MG tablet Take 4 tablets (10 mg total) by mouth once a week. Caution:Chemotherapy. Protect from light.  . Multiple Vitamins-Minerals (WOMENS MULTI VITAMIN & MINERAL PO) Take 1 tablet by mouth every other day.   . Omega-3 Fatty Acids (FISH OIL) 1000 MG CPDR Take 1 capsule by mouth every evening.   . ondansetron (ZOFRAN) 4 MG tablet Take 1 tablet (4 mg total) by mouth 2 (two) times daily as needed for nausea or vomiting.  Marland Kitchen oxyCODONE (OXY IR/ROXICODONE) 5 MG immediate release tablet Take 1 tablet (5 mg total) by mouth 2 (two) times daily.  . predniSONE (DELTASONE) 5 MG tablet Take 5 mg by mouth daily with breakfast.  . Probiotic Product (PROBIOTIC FORMULA PO) Take 1-2 capsules by mouth every morning.   . vitamin A 10000 UNIT capsule Take 10,000 Units by mouth once a week.   . vitamin C (ASCORBIC ACID) 500 MG tablet Take 500 mg by mouth once a week.   . vitamin E 400 UNIT capsule Take 400 Units by mouth every morning.   Marland Kitchen  zolpidem (AMBIEN) 5 MG tablet TAKE 1 TABLET AT BEDTIME AS NEEDED. (Patient taking differently: Take 5 mg by mouth at bedtime. TAKE 1 TABLET AT BEDTIME AS NEEDED.)  . [DISCONTINUED] methotrexate (RHEUMATREX) 2.5 MG tablet TAKE 5 TABLETS BY MOUTH ONCE A WEEK. (Patient not taking: Reported on 10/08/2015)   No facility-administered encounter medications on file as of 10/08/2015.     Objective: Blood pressure 120/80, pulse 68, temperature 97.4 F (36.3 C), temperature source Oral, resp. rate 18, height 5' 8.5" (1.74 m), weight 163 lb (73.936 kg). Patient is alert and in no acute distress. Conjunctiva is pink. Sclera is  nonicteric Oropharyngeal mucosa is normal. No neck masses or thyromegaly noted. Cardiac exam with regular rhythm normal S1 and S2. No murmur or gallop noted. Lungs are clear to auscultation. Abdomen is symmetrical. Bowel sounds are normal. On palpation abdomen is soft. She has mild tenderness at LLQ without guarding rebound. No organomegaly or masses. No LE edema or clubbing noted.  Labs/studies Results: She had abdominopelvic CT on 05/30/2015 when she was seen in emergency room revealing thickening to sigmoid colon and stable hypodense lesion in head of pancreas measuring 7 mm    Assessment:  #1. Distal ulcerative colitis. Clinically she appears to be in remission while on methotrexate oral mesalamine and low-dose prednisone. #2. LLQ abdominal pain. She has history of IBS. This pain could be multifactorial including referred pain from her back. It would be difficult to blame this pain on UC which appears to be in remission. Will monitor course of the next few days while waiting for blood work.   Plan:  Patient will call if she has worsening abdominal pain or fever greater than 100F. Patient will continue prednisone at 5 mg by mouth daily. Patient will go the lab for CBC C-reactive protein and sedimentation rate. Office visit in 4 months.

## 2015-10-09 LAB — CBC
HCT: 37.3 % (ref 35.0–45.0)
Hemoglobin: 12.2 g/dL (ref 11.7–15.5)
MCH: 29.3 pg (ref 27.0–33.0)
MCHC: 32.7 g/dL (ref 32.0–36.0)
MCV: 89.4 fL (ref 80.0–100.0)
MPV: 10.5 fL (ref 7.5–12.5)
Platelets: 341 10*3/uL (ref 140–400)
RBC: 4.17 MIL/uL (ref 3.80–5.10)
RDW: 17.3 % — ABNORMAL HIGH (ref 11.0–15.0)
WBC: 6.4 10*3/uL (ref 3.8–10.8)

## 2015-10-09 LAB — SEDIMENTATION RATE: Sed Rate: 24 mm/hr (ref 0–30)

## 2015-10-09 LAB — C-REACTIVE PROTEIN: CRP: <0.5 mg/dL (ref <0.60)

## 2015-10-12 ENCOUNTER — Other Ambulatory Visit: Payer: Self-pay | Admitting: Nurse Practitioner

## 2015-10-14 NOTE — Telephone Encounter (Signed)
Please call in xanax with 1 refills 

## 2015-11-06 ENCOUNTER — Ambulatory Visit (INDEPENDENT_AMBULATORY_CARE_PROVIDER_SITE_OTHER): Payer: Medicare Other | Admitting: Nurse Practitioner

## 2015-11-06 ENCOUNTER — Encounter: Payer: Self-pay | Admitting: Nurse Practitioner

## 2015-11-06 VITALS — BP 118/72 | HR 68 | Temp 96.9°F | Ht 68.0 in | Wt 161.0 lb

## 2015-11-06 DIAGNOSIS — M797 Fibromyalgia: Secondary | ICD-10-CM | POA: Diagnosis not present

## 2015-11-06 DIAGNOSIS — G4700 Insomnia, unspecified: Secondary | ICD-10-CM | POA: Diagnosis not present

## 2015-11-06 DIAGNOSIS — F329 Major depressive disorder, single episode, unspecified: Secondary | ICD-10-CM | POA: Diagnosis not present

## 2015-11-06 DIAGNOSIS — E8881 Metabolic syndrome: Secondary | ICD-10-CM | POA: Diagnosis not present

## 2015-11-06 DIAGNOSIS — K5150 Left sided colitis without complications: Secondary | ICD-10-CM | POA: Diagnosis not present

## 2015-11-06 DIAGNOSIS — I10 Essential (primary) hypertension: Secondary | ICD-10-CM | POA: Diagnosis not present

## 2015-11-06 DIAGNOSIS — E785 Hyperlipidemia, unspecified: Secondary | ICD-10-CM | POA: Diagnosis not present

## 2015-11-06 DIAGNOSIS — F32A Depression, unspecified: Secondary | ICD-10-CM

## 2015-11-06 LAB — BAYER DCA HB A1C WAIVED: HB A1C: 6.4 % (ref <7.0)

## 2015-11-06 MED ORDER — ALPRAZOLAM 0.5 MG PO TABS: 0.5000 mg | ORAL_TABLET | Freq: Two times a day (BID) | ORAL | 1 refills | Status: DC | PRN

## 2015-11-06 MED ORDER — OXYCODONE HCL 5 MG PO TABS: 5.0000 mg | ORAL_TABLET | Freq: Two times a day (BID) | ORAL | 0 refills | Status: DC

## 2015-11-06 MED ORDER — OXYCODONE HCL 5 MG PO TABS: 5.0000 mg | ORAL_TABLET | ORAL | 0 refills | Status: DC | PRN

## 2015-11-06 MED ORDER — ZOLPIDEM TARTRATE 5 MG PO TABS: ORAL_TABLET | ORAL | 2 refills | Status: DC

## 2015-11-06 MED ORDER — ESCITALOPRAM OXALATE 20 MG PO TABS: 20.0000 mg | ORAL_TABLET | Freq: Every day | ORAL | 1 refills | Status: DC

## 2015-11-06 NOTE — Progress Notes (Signed)
Subjective:    Patient ID: [name], female    DOB: 05/14/1940, 75 y.o.   MRN: [id number]  Patient here today for follow up of chronic medical problems. No complaints today  Hypertension  This is a chronic problem. The current episode started more than 1 year ago. The problem is unchanged. The problem is controlled. Pertinent negatives include no chest pain, headaches, neck pain, palpitations or shortness of breath. Risk factors for coronary artery disease include dyslipidemia, post-menopausal state and sedentary lifestyle. Past treatments include ACE inhibitors and diuretics. The current treatment provides moderate improvement. Compliance problems include diet and exercise.  There is no history of CAD/MI, CVA, heart failure or PVD.  Hyperlipidemia  This is a chronic problem. The current episode started more than 1 year ago. The problem is controlled. She has no history of diabetes, hypothyroidism or obesity. Pertinent negatives include no chest pain or shortness of breath. She is currently on no antihyperlipidemic treatment. The current treatment provides moderate improvement of lipids. Compliance problems include adherence to diet and adherence to exercise.  Risk factors for coronary artery disease include dyslipidemia, hypertension and post-menopausal.  Diabetes  She presents for her follow-up diabetic visit. She has type 2 diabetes mellitus. Pertinent negatives for hypoglycemia include no headaches. Pertinent negatives for diabetes include no chest pain. Symptoms are stable. Pertinent negatives for diabetic complications include no CVA or PVD. Her weight is stable. When asked about meal planning, she reported none. She has not had a previous visit with a dietitian. She rarely participates in exercise. Home blood sugar record trend: only checks occassionally. Her breakfast blood glucose range is generally 130-140 mg/dl. Her overall blood glucose range is 130-140 mg/dl. An ACE  inhibitor/angiotensin II receptor blocker is being taken. She does not see a podiatrist.Eye exam is not current.  ulcerative colitis Dr. [name]- curremtly coming off of  prednisone- Was on Humira which was not working so they have put her on methotreazate yesterday. Takes pain medication for the constant pain. Was seen for pain management 05/27/15 and was given rx to get last refill on 09/12/15. Pain assessment: Cause of pain- ulcerative colitis Pain location- abdomen Pain on scale of 1-10- 5/10 Frequency- daily What increases pain-certain fods can aggravate What makes pain Better- pain meds Effects on ADL - some days pain is so bad that she cannot function Any change in general medical condition- no chnages  Current medications- roxicodone 22m tid Effectiveness of current meds-helps make pain barable Adverse reactions form pain meds-none  Pill count performed-No Urine drug screen- No [place]  [place] were their any concerning findings? - no  Drepression/GAD lexapro- working well to keep her from worrying so much. Takes xanax as needed at least BID- insurance no longer wants to pay for xanax so need to change. insomnia ambien but only takes occasionally- uses benadryl most of the time.   Review of Systems  HENT: Negative.   Respiratory: Negative for shortness of breath.   Cardiovascular: Negative for chest pain and palpitations.  Gastrointestinal: Positive for constipation, diarrhea and nausea.  Musculoskeletal: Negative for neck pain.  Neurological: Negative for headaches.  Psychiatric/Behavioral: Negative.   All other systems reviewed and are negative.      Objective:   Physical Exam  Constitutional: She is oriented to person, place, and time. She appears well-developed and well-nourished.  HENT:  Nose: Nose normal.  Mouth/Throat: Oropharynx is clear and moist.  Eyes: EOM are normal.  Neck: Trachea normal, normal  range of motion and full passive range  of motion without pain. Neck supple. No JVD present. Carotid bruit is not present. No thyromegaly present.  Cardiovascular: Normal rate, regular rhythm, normal heart sounds and intact distal pulses.  Exam reveals no gallop and no friction rub.   No murmur heard. Pulmonary/Chest: Effort normal and breath sounds normal.  Abdominal: Soft. Bowel sounds are normal. She exhibits no distension and no mass. There is tenderness (diffuse).  Musculoskeletal: Normal range of motion.  Lymphadenopathy:    She has no cervical adenopathy.  Neurological: She is alert and oriented to person, place, and time. She has normal reflexes.  Skin: Skin is warm and dry.  Psychiatric: She has a normal mood and affect. Her behavior is normal. Judgment and thought content normal.   BP 118/72   Pulse 68   Temp (!) 96.9 F (36.1 C) (Oral)   Ht _0  (1.727 m)   Wt 161 lb (73 kg)   BMI 24.48 kg/m    HGBA1C 6.4% up from 6.3% at last visit      Assessment & Plan:  1. Metabolic syndrome Watch carbs in diet - Bayer DCA Hb A1c Waived  2. Insomnia Bedtime ritual - zolpidem (AMBIEN) 5 MG tablet; TAKE 1 TABLET AT BEDTIME AS NEEDED.  Dispense: 30 tablet; Refill: 2  3. Hyperlipidemia with target LDL less than 100 Low fat diet - Lipid panel  4. Depression Stress management - escitalopram (LEXAPRO) 20 MG tablet; Take 1 tablet (20 mg total) by mouth daily.  Dispense: 90 tablet; Refill: 1 - ALPRAZolam (XANAX) 0.5 MG tablet; Take 1 tablet (0.5 mg total) by mouth 2 (two) times daily as needed. for anxiety  Dispense: 60 tablet; Refill: 1  5. Fibromyalgia Exercise to keep muscles wrm  6. Essential hypertension Do not add salt to diet - CMP14+EGFR  7. ULCERATIVE COLITIS, LEFT SIDED Keep follow up with GI - oxyCODONE (OXY IR/ROXICODONE) 5 MG immediate release tablet; Take 1 tablet (5 mg total) by mouth 2 (two) times daily.  Dispense: 60 tablet; Refill: 0    Labs pending Health maintenance reviewed Diet and  exercise encouraged Continue all meds Follow up  In 3 month   Candelaria Arenas, FNP

## 2015-11-07 ENCOUNTER — Encounter: Payer: Self-pay | Admitting: Nurse Practitioner

## 2015-11-07 DIAGNOSIS — N183 Chronic kidney disease, stage 3 unspecified: Secondary | ICD-10-CM | POA: Insufficient documentation

## 2015-11-07 LAB — CMP14+EGFR
A/G RATIO: 1.6 (ref 1.2–2.2)
ALK PHOS: 62 IU/L (ref 39–117)
ALT: 12 IU/L (ref 0–32)
AST: 20 IU/L (ref 0–40)
Albumin: 4 g/dL (ref 3.5–4.8)
BILIRUBIN TOTAL: 0.6 mg/dL (ref 0.0–1.2)
BUN/Creatinine Ratio: 17 (ref 12–28)
BUN: 25 mg/dL (ref 8–27)
CALCIUM: 9.2 mg/dL (ref 8.7–10.3)
CHLORIDE: 100 mmol/L (ref 96–106)
CO2: 24 mmol/L (ref 18–29)
Creatinine, Ser: 1.47 mg/dL — ABNORMAL HIGH (ref 0.57–1.00)
GFR calc Af Amer: 40 mL/min/{1.73_m2} — ABNORMAL LOW (ref >59)
GFR, EST NON AFRICAN AMERICAN: 35 mL/min/{1.73_m2} — AB (ref >59)
GLOBULIN, TOTAL: 2.5 g/dL (ref 1.5–4.5)
Glucose: 121 mg/dL — ABNORMAL HIGH (ref 65–99)
POTASSIUM: 5 mmol/L (ref 3.5–5.2)
SODIUM: 142 mmol/L (ref 134–144)
Total Protein: 6.5 g/dL (ref 6.0–8.5)

## 2015-11-07 LAB — LIPID PANEL
Chol/HDL Ratio: 5.4 ratio — ABNORMAL HIGH (ref 0.0–4.4)
Cholesterol, Total: 210 mg/dL — ABNORMAL HIGH (ref 100–199)
HDL: 39 mg/dL — ABNORMAL LOW
LDL Calculated: 138 mg/dL — ABNORMAL HIGH (ref 0–99)
Triglycerides: 167 mg/dL — ABNORMAL HIGH (ref 0–149)
VLDL Cholesterol Cal: 33 mg/dL (ref 5–40)

## 2015-11-21 ENCOUNTER — Other Ambulatory Visit: Payer: Self-pay | Admitting: Nurse Practitioner

## 2015-11-21 DIAGNOSIS — R110 Nausea: Secondary | ICD-10-CM

## 2016-01-16 ENCOUNTER — Other Ambulatory Visit: Payer: Self-pay | Admitting: *Deleted

## 2016-01-16 MED ORDER — ALPRAZOLAM 0.5 MG PO TABS: 0.5000 mg | ORAL_TABLET | Freq: Two times a day (BID) | ORAL | 1 refills | Status: DC | PRN

## 2016-01-16 NOTE — Telephone Encounter (Signed)
Please call in xanax with 1 refills 

## 2016-01-18 NOTE — Telephone Encounter (Signed)
Rx called to [facility]

## 2016-02-03 ENCOUNTER — Other Ambulatory Visit (INDEPENDENT_AMBULATORY_CARE_PROVIDER_SITE_OTHER): Payer: Self-pay | Admitting: Internal Medicine

## 2016-02-06 ENCOUNTER — Ambulatory Visit (INDEPENDENT_AMBULATORY_CARE_PROVIDER_SITE_OTHER): Payer: Medicare Other | Admitting: Nurse Practitioner

## 2016-02-06 ENCOUNTER — Other Ambulatory Visit: Payer: Self-pay | Admitting: Nurse Practitioner

## 2016-02-06 ENCOUNTER — Encounter: Payer: Self-pay | Admitting: Nurse Practitioner

## 2016-02-06 VITALS — BP 136/90 | HR 86 | Temp 97.5°F | Ht 68.0 in | Wt 158.0 lb

## 2016-02-06 DIAGNOSIS — F3342 Major depressive disorder, recurrent, in full remission: Secondary | ICD-10-CM

## 2016-02-06 DIAGNOSIS — M797 Fibromyalgia: Secondary | ICD-10-CM

## 2016-02-06 DIAGNOSIS — E119 Type 2 diabetes mellitus without complications: Secondary | ICD-10-CM | POA: Diagnosis not present

## 2016-02-06 DIAGNOSIS — N183 Chronic kidney disease, stage 3 unspecified: Secondary | ICD-10-CM

## 2016-02-06 DIAGNOSIS — J0101 Acute recurrent maxillary sinusitis: Secondary | ICD-10-CM

## 2016-02-06 DIAGNOSIS — R110 Nausea: Secondary | ICD-10-CM

## 2016-02-06 DIAGNOSIS — F5101 Primary insomnia: Secondary | ICD-10-CM | POA: Diagnosis not present

## 2016-02-06 DIAGNOSIS — E785 Hyperlipidemia, unspecified: Secondary | ICD-10-CM | POA: Diagnosis not present

## 2016-02-06 DIAGNOSIS — Z23 Encounter for immunization: Secondary | ICD-10-CM | POA: Diagnosis not present

## 2016-02-06 DIAGNOSIS — I10 Essential (primary) hypertension: Secondary | ICD-10-CM

## 2016-02-06 DIAGNOSIS — E8881 Metabolic syndrome: Secondary | ICD-10-CM

## 2016-02-06 DIAGNOSIS — K5150 Left sided colitis without complications: Secondary | ICD-10-CM

## 2016-02-06 LAB — BAYER DCA HB A1C WAIVED: HB A1C (BAYER DCA - WAIVED): 6.1 % (ref <7.0)

## 2016-02-06 MED ORDER — ALPRAZOLAM 0.5 MG PO TABS: 0.5000 mg | ORAL_TABLET | Freq: Two times a day (BID) | ORAL | 2 refills | Status: DC | PRN

## 2016-02-06 MED ORDER — DOXYCYCLINE HYCLATE 100 MG PO TABS: 100.0000 mg | ORAL_TABLET | Freq: Two times a day (BID) | ORAL | 0 refills | Status: DC

## 2016-02-06 MED ORDER — OXYCODONE HCL 5 MG PO TABS: 5.0000 mg | ORAL_TABLET | Freq: Two times a day (BID) | ORAL | 0 refills | Status: DC

## 2016-02-06 MED ORDER — OXYCODONE HCL 5 MG PO TABS: 5.0000 mg | ORAL_TABLET | ORAL | 0 refills | Status: DC | PRN

## 2016-02-06 MED ORDER — BENAZEPRIL-HYDROCHLOROTHIAZIDE 20-12.5 MG PO TABS: 1.0000 | ORAL_TABLET | Freq: Every day | ORAL | 5 refills | Status: DC

## 2016-02-06 MED ORDER — ESCITALOPRAM OXALATE 20 MG PO TABS: 20.0000 mg | ORAL_TABLET | Freq: Every day | ORAL | 1 refills | Status: DC

## 2016-02-06 MED ORDER — ZOLPIDEM TARTRATE 5 MG PO TABS: ORAL_TABLET | ORAL | 2 refills | Status: DC

## 2016-02-06 MED ORDER — DICYCLOMINE HCL 20 MG PO TABS: ORAL_TABLET | ORAL | 5 refills | Status: DC

## 2016-02-06 MED ORDER — ONDANSETRON HCL 4 MG PO TABS: ORAL_TABLET | ORAL | 0 refills | Status: DC

## 2016-02-06 NOTE — Progress Notes (Signed)
Subjective:    Patient ID: [name], female    DOB: 04/07/1940, 75 y.o.   MRN: [id number]  Patient here today for follow up of chronic medical problems.  Outpatient Encounter Prescriptions as of 02/06/2016  Medication Sig  . ALPRAZolam (XANAX) 0.5 MG tablet Take 1 tablet (0.5 mg total) by mouth 2 (two) times daily as needed. for anxiety  . antiseptic oral rinse (BIOTENE) LIQD 1 application by Mouth Rinse route daily. FOR DRY MOUTH  . B Complex CAPS Take 1 capsule by mouth every morning.   . benazepril-hydrochlorthiazide (LOTENSIN HCT) 20-12.5 MG tablet Take 1 tablet by mouth daily.  . Cholecalciferol (VITAMIN D3) 1000 UNITS CAPS Take 1 capsule by mouth. Patient states that she alternates with Hair, Skin, and Nails.  Marland Kitchen co-enzyme Q-10 50 MG capsule Take 50 mg by mouth once a week.   . dicyclomine (BENTYL) 20 MG tablet TAKE 1/2 TABLET TWICE DAILY BEFORE A MEAL.  Marland Kitchen escitalopram (LEXAPRO) 20 MG tablet Take 1 tablet (20 mg total) by mouth daily.  . hydrocortisone (PROCTOSOL HC) 2.5 % rectal cream Place 1 application rectally 2 (two) times daily.  . methotrexate (RHEUMATREX) 2.5 MG tablet Take 4 tablets (10 mg total) by mouth once a week. Caution:Chemotherapy. Protect from light.  . Multiple Vitamins-Minerals (WOMENS MULTI VITAMIN & MINERAL PO) Take 1 tablet by mouth every other day.   . Omega-3 Fatty Acids (FISH OIL) 1000 MG CPDR Take 1 capsule by mouth every evening.   . ondansetron (ZOFRAN) 4 MG tablet TAKE 1 TABLET TWICE DAILY AS NEEDED FOR NAUSEA OR VOMITING.  Marland Kitchen oxyCODONE (OXY IR/ROXICODONE) 5 MG immediate release tablet Take 1 tablet (5 mg total) by mouth 2 (two) times daily.  Marland Kitchen oxyCODONE (OXY IR/ROXICODONE) 5 MG immediate release tablet Take 1 tablet (5 mg total) by mouth every 4 (four) hours as needed for severe pain.  Marland Kitchen oxyCODONE (ROXICODONE) 5 MG immediate release tablet Take 1 tablet (5 mg total) by mouth every 4 (four) hours as needed for severe pain.  . Probiotic Product  (PROBIOTIC FORMULA PO) Take 1-2 capsules by mouth every morning.   . vitamin A 10000 UNIT capsule Take 10,000 Units by mouth once a week.   . vitamin C (ASCORBIC ACID) 500 MG tablet Take 500 mg by mouth once a week.   . vitamin E 400 UNIT capsule Take 400 Units by mouth every morning.   . zolpidem (AMBIEN) 5 MG tablet TAKE 1 TABLET AT BEDTIME AS NEEDED.   C/O facial swelling and echymosis of left cheek- dentist says it is not coming from teeth- sinus infection  Hypertension  This is a chronic problem. The current episode started more than 1 year ago. The problem is unchanged. The problem is controlled. Pertinent negatives include no chest pain, headaches, neck pain, palpitations or shortness of breath. Risk factors for coronary artery disease include dyslipidemia, post-menopausal state and sedentary lifestyle. Past treatments include ACE inhibitors and diuretics. The current treatment provides moderate improvement. Compliance problems include diet and exercise.  There is no history of CAD/MI, CVA, heart failure or PVD.  Hyperlipidemia  This is a chronic problem. The current episode started more than 1 year ago. The problem is controlled. She has no history of diabetes, hypothyroidism or obesity. Pertinent negatives include no chest pain or shortness of breath. She is currently on no antihyperlipidemic treatment. The current treatment provides moderate improvement of lipids. Compliance problems include adherence to diet and adherence to exercise.  Risk factors for coronary  artery disease include dyslipidemia, hypertension and post-menopausal.  Diabetes  She presents for her follow-up diabetic visit. She has type 2 diabetes mellitus. Pertinent negatives for hypoglycemia include no headaches. Pertinent negatives for diabetes include no chest pain. Symptoms are stable. Pertinent negatives for diabetic complications include no CVA or PVD. Her weight is stable. When asked about meal planning, she reported  none. She has not had a previous visit with a dietitian. She rarely participates in exercise. Home blood sugar record trend: only checks occassionally. Her breakfast blood glucose range is generally 130-140 mg/dl. Her overall blood glucose range is 130-140 mg/dl. An ACE inhibitor/angiotensin II receptor blocker is being taken. She does not see a podiatrist.Eye exam is not current.  ulcerative colitis Dr. [name]-  off of  prednisone- methotreazate working ok for flare ups.. Takes pain medication for the constant pain. Was seen for pain management 05/27/15. Pain assessment: Cause of pain- ulcerative colitis Pain location- abdomen Pain on scale of 1-10- 5/10 Frequency- daily What increases pain- What makes pain Better- nothing in particular Effects on ADL - some days pain is so bad she just lays around all day long Any change in general medical condition- none  Current medications- roxicodone 60m Effectiveness of current meds-helps but does not completely relieve pain Adverse reactions form pain meds-- none Morphine equivalent- 20  Pill count performed-No Urine drug screen- No [place]  If [place] were their any concerning findings? - no suspicious activity  Drepression/GAD lexapor- working well to keep her from worrying so much. Takes xanax as needed at least BID- insurance no longer wants to pay for xanax so need to change. insomnia ambien but only takes occasionally- uses benadryl most of the time.   Review of Systems  HENT: Negative.   Respiratory: Negative for shortness of breath.   Cardiovascular: Negative for chest pain and palpitations.  Gastrointestinal: Positive for constipation, diarrhea and nausea.  Musculoskeletal: Negative for neck pain.  Neurological: Negative for headaches.  Psychiatric/Behavioral: Negative.   All other systems reviewed and are negative.      Objective:   Physical Exam  Constitutional: She is oriented to person, place, and time.  She appears well-developed and well-nourished.  HENT:  Right Ear: Hearing, tympanic membrane, external ear and ear canal normal.  Left Ear: Hearing, tympanic membrane, external ear and ear canal normal.  Nose: Mucosal edema and rhinorrhea present.  Mouth/Throat: Uvula is midline and oropharynx is clear and moist.  Mild maxillary sinus tenderness bil  Eyes: EOM are normal.  Neck: Trachea normal, normal range of motion and full passive range of motion without pain. Neck supple. No JVD present. Carotid bruit is not present. No thyromegaly present.  Cardiovascular: Normal rate, regular rhythm, normal heart sounds and intact distal pulses.  Exam reveals no gallop and no friction rub.   No murmur heard. Pulmonary/Chest: Effort normal and breath sounds normal.  Abdominal: Soft. Bowel sounds are normal. She exhibits no distension and no mass. There is tenderness (diffuse).  Musculoskeletal: Normal range of motion.  Lymphadenopathy:    She has no cervical adenopathy.  Neurological: She is alert and oriented to person, place, and time. She has normal reflexes.  Skin: Skin is warm and dry.  Fever blister on left upper and lowe lip  Psychiatric: She has a normal mood and affect. Her behavior is normal. Judgment and thought content normal.   BP 136/90 (BP Location: Left Arm, Cuff Size: Normal)   Pulse 86   Temp 97.5 F (  36.4 C) (Oral)   Ht _0  (1.727 m)   Wt 158 lb (71.7 kg)   BMI 24.02 kg/m     HGBA1C 6.1%    Assessment & Plan:  1. Essential hypertension Low sodium diet - CMP14+EGFR - benazepril-hydrochlorthiazide (LOTENSIN HCT) 20-12.5 MG tablet; Take 1 tablet by mouth daily.  Dispense: 30 tablet; Refill: 5  2. Hyperlipidemia with target LDL less than 100 Low fta diet - Lipid panel  3. ULCERATIVE COLITIS, LEFT SIDED Watch foods that irritate bowel - oxyCODONE (OXY IR/ROXICODONE) 5 MG immediate release tablet; Take 1 tablet (5 mg total) by mouth 2 (two) times daily.  Dispense: 60  tablet; Refill: 0 - oxyCODONE (ROXICODONE) 5 MG immediate release tablet; Take 1 tablet (5 mg total) by mouth every 4 (four) hours as needed for severe pain.  Dispense: 60 tablet; Refill: 0 - oxyCODONE (OXY IR/ROXICODONE) 5 MG immediate release tablet; Take 1 tablet (5 mg total) by mouth 2 (two) times daily.  Dispense: 60 tablet; Refill: 0 - dicyclomine (BENTYL) 20 MG tablet; TAKE 1/2 TABLET TWICE DAILY BEFORE A MEAL.  Dispense: 30 tablet; Refill: 5  4. CKD (chronic kidney disease) stage 3, GFR 30-59 ml/min Avoid NSAIDS  5. Recurrent major depressive disorder, in full remission ([place]) Stress management - escitalopram (LEXAPRO) 20 MG tablet; Take 1 tablet (20 mg total) by mouth daily.  Dispense: 90 tablet; Refill: 1 - ALPRAZolam (XANAX) 0.5 MG tablet; Take 1 tablet (0.5 mg total) by mouth 2 (two) times daily as needed. for anxiety  Dispense: 60 tablet; Refill: 2  6. Fibromyalgia exercise  7. Primary insomnia Bedtime routine - zolpidem (AMBIEN) 5 MG tablet; TAKE 1 TABLET AT BEDTIME AS NEEDED.  Dispense: 30 tablet; Refill: 2  8. Metabolic syndrome Watch carbs in diet - Bayer DCA Hb A1c Waived  9. Nausea without vomiting - ondansetron (ZOFRAN) 4 MG tablet; TAKE 1 TABLET TWICE DAILY AS NEEDED FOR NAUSEA OR VOMITING.  Dispense: 20 tablet; Refill: 0  10. Sinusitis -doxycycline 162m bid x10 days /1. Take meds as prescribed 2. Use a cool mist humidifier especially during the winter months and when heat has been humid. 3. Use saline nose sprays frequently 4. Saline irrigations of the nose can be very helpful if done frequently.  * 4X daily for 1 week*  * Use of a nettie pot can be helpful with this. Follow directions with this* 5. Drink plenty of fluids 6. Keep thermostat turn down low 7.For any cough or congestion  Use plain Mucinex- regular strength or max strength is fine   * Children- consult with Pharmacist for dosing 8. For fever or aces or pains- take tylenol or ibuprofen  appropriate for age and weight.  * for fevers greater than 101 orally you may alternate ibuprofen and tylenol every  3 hours.     Labs pending Health maintenance reviewed Diet and exercise encouraged Continue all meds Follow up  In 3 months   MStevenson FNP

## 2016-02-06 NOTE — Patient Instructions (Signed)
Fall Prevention in the Home Introduction Falls can cause injuries. They can happen to people of all ages. There are many things you can do to make your home safe and to help prevent falls. What can I do on the outside of my home?  Regularly fix the edges of walkways and driveways and fix any cracks.  Remove anything that might make you trip as you walk through a door, such as a raised step or threshold.  Trim any bushes or trees on the path to your home.  Use bright outdoor lighting.  Clear any walking paths of anything that might make someone trip, such as rocks or tools.  Regularly check to see if handrails are loose or broken. Make sure that both sides of any steps have handrails.  Any raised decks and porches should have guardrails on the edges.  Have any leaves, snow, or ice cleared regularly.  Use sand or salt on walking paths during winter.  Clean up any spills in your garage right away. This includes oil or grease spills. What can I do in the bathroom?  Use night lights.  Install grab bars by the toilet and in the tub and shower. Do not use towel bars as grab bars.  Use non-skid mats or decals in the tub or shower.  If you need to sit down in the shower, use a plastic, non-slip stool.  Keep the floor dry. Clean up any water that spills on the floor as soon as it happens.  Remove soap buildup in the tub or shower regularly.  Attach bath mats securely with double-sided non-slip rug tape.  Do not have throw rugs and other things on the floor that can make you trip. What can I do in the bedroom?  Use night lights.  Make sure that you have a light by your bed that is easy to reach.  Do not use any sheets or blankets that are too big for your bed. They should not hang down onto the floor.  Have a firm chair that has side arms. You can use this for support while you get dressed.  Do not have throw rugs and other things on the floor that can make you trip. What can  I do in the kitchen?  Clean up any spills right away.  Avoid walking on wet floors.  Keep items that you use a lot in easy-to-reach places.  If you need to reach something above you, use a strong step stool that has a grab bar.  Keep electrical cords out of the way.  Do not use floor polish or wax that makes floors slippery. If you must use wax, use non-skid floor wax.  Do not have throw rugs and other things on the floor that can make you trip. What can I do with my stairs?  Do not leave any items on the stairs.  Make sure that there are handrails on both sides of the stairs and use them. Fix handrails that are broken or loose. Make sure that handrails are as long as the stairways.  Check any carpeting to make sure that it is firmly attached to the stairs. Fix any carpet that is loose or worn.  Avoid having throw rugs at the top or bottom of the stairs. If you do have throw rugs, attach them to the floor with carpet tape.  Make sure that you have a light switch at the top of the stairs and the bottom of the stairs. If you  do not have them, ask someone to add them for you. What else can I do to help prevent falls?  Wear shoes that:  Do not have high heels.  Have rubber bottoms.  Are comfortable and fit you well.  Are closed at the toe. Do not wear sandals.  If you use a stepladder:  Make sure that it is fully opened. Do not climb a closed stepladder.  Make sure that both sides of the stepladder are locked into place.  Ask someone to hold it for you, if possible.  Clearly mark and make sure that you can see:  Any grab bars or handrails.  First and last steps.  Where the edge of each step is.  Use tools that help you move around (mobility aids) if they are needed. These include:  Canes.  Walkers.  Scooters.  Crutches.  Turn on the lights when you go into a dark area. Replace any light bulbs as soon as they burn out.  Set up your furniture so you have a  clear path. Avoid moving your furniture around.  If any of your floors are uneven, fix them.  If there are any pets around you, be aware of where they are.  Review your medicines with your doctor. Some medicines can make you feel dizzy. This can increase your chance of falling. Ask your doctor what other things that you can do to help prevent falls. This information is not intended to replace advice given to you by your health care provider. Make sure you discuss any questions you have with your health care provider. Document Released: 01/03/2009 Document Revised: 08/15/2015 Document Reviewed: 04/13/2014  2017 [employer]

## 2016-02-07 LAB — CMP14+EGFR
A/G RATIO: 1.8 (ref 1.2–2.2)
ALT: 14 IU/L (ref 0–32)
AST: 21 IU/L (ref 0–40)
Albumin: 4.2 g/dL (ref 3.5–4.8)
Alkaline Phosphatase: 69 IU/L (ref 39–117)
BUN/Creatinine Ratio: 15 (ref 12–28)
BUN: 19 mg/dL (ref 8–27)
Bilirubin Total: 0.6 mg/dL (ref 0.0–1.2)
CALCIUM: 9.2 mg/dL (ref 8.7–10.3)
CO2: 26 mmol/L (ref 18–29)
CREATININE: 1.31 mg/dL — AB (ref 0.57–1.00)
Chloride: 100 mmol/L (ref 96–106)
GFR, EST AFRICAN AMERICAN: 46 mL/min/{1.73_m2} — AB (ref >59)
GFR, EST NON AFRICAN AMERICAN: 40 mL/min/{1.73_m2} — AB (ref >59)
GLOBULIN, TOTAL: 2.4 g/dL (ref 1.5–4.5)
Glucose: 125 mg/dL — ABNORMAL HIGH (ref 65–99)
POTASSIUM: 4.7 mmol/L (ref 3.5–5.2)
SODIUM: 140 mmol/L (ref 134–144)
TOTAL PROTEIN: 6.6 g/dL (ref 6.0–8.5)

## 2016-02-07 LAB — LIPID PANEL
CHOL/HDL RATIO: 5.1 ratio — AB (ref 0.0–4.4)
Cholesterol, Total: 219 mg/dL — ABNORMAL HIGH (ref 100–199)
HDL: 43 mg/dL (ref >39)
LDL CALC: 129 mg/dL — AB (ref 0–99)
TRIGLYCERIDES: 236 mg/dL — AB (ref 0–149)
VLDL Cholesterol Cal: 47 mg/dL — ABNORMAL HIGH (ref 5–40)

## 2016-02-10 ENCOUNTER — Ambulatory Visit: Payer: Medicare Other | Admitting: *Deleted

## 2016-02-11 ENCOUNTER — Other Ambulatory Visit (INDEPENDENT_AMBULATORY_CARE_PROVIDER_SITE_OTHER): Payer: Self-pay | Admitting: *Deleted

## 2016-02-11 ENCOUNTER — Encounter (INDEPENDENT_AMBULATORY_CARE_PROVIDER_SITE_OTHER): Payer: Self-pay | Admitting: Internal Medicine

## 2016-02-11 ENCOUNTER — Ambulatory Visit (INDEPENDENT_AMBULATORY_CARE_PROVIDER_SITE_OTHER): Payer: Medicare Other | Admitting: Internal Medicine

## 2016-02-11 VITALS — BP 128/86 | HR 68 | Temp 97.9°F | Ht 68.0 in | Wt 159.0 lb

## 2016-02-11 DIAGNOSIS — R197 Diarrhea, unspecified: Secondary | ICD-10-CM

## 2016-02-11 DIAGNOSIS — K51918 Ulcerative colitis, unspecified with other complication: Secondary | ICD-10-CM

## 2016-02-11 NOTE — Progress Notes (Signed)
Presenting complaint;  Follow-up for ulcerative colitis.  Subjective:  Patient is 75 year old Caucasian female who is here for scheduled visit. She was last seen on 10/08/2015. She states she is been off prednisone for more than 3 months. She was doing well until she started an antibiotic last week for sinusitis. She states she has 5 more days worth of medication left. She states she was having 2 formed stools daily now she is having diarrhea again. She already has had for loose stools today. She has noted more pain in left low quadrant of her abdomen. She is not passing blood per rectum. She denies fever or chills. She remains with good appetite. She continues to complain of polyarthralgias particularly in her hands. She believes she has been diagnosed with rheumatoid arthritis. She is still having some burning involving oral mucosa but not as bad as before.    Current Medications: Outpatient Encounter Prescriptions as of 02/11/2016  Medication Sig  . Adalimumab 40 MG/0.8ML PSKT Inject into the skin.  Marland Kitchen ALPRAZolam (XANAX) 0.5 MG tablet Take 1 tablet (0.5 mg total) by mouth 2 (two) times daily as needed. for anxiety  . antiseptic oral rinse (BIOTENE) LIQD 1 application by Mouth Rinse route daily. FOR DRY MOUTH  . antiseptic oral rinse (CPC / CETYLPYRIDINIUM CHLORIDE 0.05%) 0.05 % LIQD solution Use as directed in the mouth or throat.  . B Complex CAPS Take 1 capsule by mouth every morning.   . benazepril-hydrochlorthiazide (LOTENSIN HCT) 20-12.5 MG tablet Take 1 tablet by mouth daily.  . Cholecalciferol (VITAMIN D3) 1000 UNITS CAPS Take 1 capsule by mouth. Patient states that she alternates with Hair, Skin, and Nails.  Marland Kitchen co-enzyme Q-10 50 MG capsule Take 50 mg by mouth once a week.   . dicyclomine (BENTYL) 20 MG tablet TAKE 1/2 TABLET TWICE DAILY BEFORE A MEAL.  Marland Kitchen doxycycline (VIBRA-TABS) 100 MG tablet Take 1 tablet (100 mg total) by mouth 2 (two) times daily. 1 po bid  . escitalopram  (LEXAPRO) 20 MG tablet Take 1 tablet (20 mg total) by mouth daily.  . hydrocortisone (PROCTOSOL HC) 2.5 % rectal cream Place 1 application rectally 2 (two) times daily.  . methotrexate (RHEUMATREX) 2.5 MG tablet Take 4 tablets (10 mg total) by mouth once a week. Caution:Chemotherapy. Protect from light.  . Multiple Vitamin (THERA) TABS Take by mouth.  . Multiple Vitamins-Minerals (WOMENS MULTI VITAMIN & MINERAL PO) Take 1 tablet by mouth every other day.   . Omega-3 Fatty Acids (FISH OIL) 1000 MG CPDR Take 1 capsule by mouth every evening.   . ondansetron (ZOFRAN) 4 MG tablet TAKE 1 TABLET TWICE DAILY AS NEEDED FOR NAUSEA OR VOMITING.  Marland Kitchen oxyCODONE (OXY IR/ROXICODONE) 5 MG immediate release tablet Take 1 tablet (5 mg total) by mouth 2 (two) times daily.  Marland Kitchen oxyCODONE (OXY IR/ROXICODONE) 5 MG immediate release tablet Take 1 tablet (5 mg total) by mouth 2 (two) times daily.  Marland Kitchen oxyCODONE (ROXICODONE) 5 MG immediate release tablet Take 1 tablet (5 mg total) by mouth every 4 (four) hours as needed for severe pain.  . Probiotic Product (PROBIOTIC FORMULA PO) Take 1-2 capsules by mouth every morning.   Marland Kitchen PROBIOTIC PRODUCT PO Take by mouth.  . vitamin A 10000 UNIT capsule Take 10,000 Units by mouth once a week.   Marland Kitchen VITAMIN A PO Take by mouth.  . vitamin C (ASCORBIC ACID) 500 MG tablet Take 500 mg by mouth once a week.   . vitamin E 400 UNIT capsule  Take 400 Units by mouth every morning.   Marland Kitchen VITAMIN E PO Take by mouth.  . zolpidem (AMBIEN) 5 MG tablet TAKE 1 TABLET AT BEDTIME AS NEEDED.   No facility-administered encounter medications on file as of 02/11/2016.      Objective: Blood pressure 128/86, pulse 68, temperature 97.9 F (36.6 C), height 5' 8"  (1.727 m), weight 159 lb (72.1 kg). Patient is alert and in no acute distress. Conjunctiva is pink. Sclera is nonicteric Oropharyngeal mucosa is normal. No neck masses or thyromegaly noted. Cardiac exam with regular rhythm normal S1 and S2. No murmur  or gallop noted. Lungs are clear to auscultation. Abdomen is symmetrical. Bowel sounds are hyperactive. On palpation abdomen is soft with mild tenderness at LLQ and RLQ. No guarding or rebound noted. No organomegaly or masses. No LE edema or clubbing noted. There is expansion to PIPs of third and fourth fingers in both hands.  Labs/studies Results: Lab data from 02/06/2016  Bilirubin 0.6, AP 69, AST 21, ALT 14, total protein 6.6 and albumin 4.2.  Serum creatinine 1.13   CRP was less than 0.5 on 10/08/2015  Assessment:  #1.Distal ulcerative colitis. It is hard to tell whether she is in remission are not given ongoing symptoms which may have been triggered with antibiotic that she is taking for sinusitis. She appears to have had best response with methotrexate. #2. Diarrhea possibly secondary to antibiotic use. She is not toxic. If she develops fever will screen for C. difficile. #3. Irritable bowel syndrome. She is on dicyclomine which she will continue.   Plan:  Patient will go to the lab for CBC with differential and CRP when she can. Imodium OTC 2 mg by mouth 3 times a day when necessary. Patient advised to call office if she has temperature greater than 100 or diarrhea continues after she finishes doxycycline. Office visit in 6 months.

## 2016-02-11 NOTE — Patient Instructions (Addendum)
Call if you have temp greater than 100 for diarrhea not resolved on stopping antibiotic. Imodium OTC 2 mg by mouth up to 3 times a day as needed

## 2016-02-14 ENCOUNTER — Telehealth: Payer: Self-pay | Admitting: Nurse Practitioner

## 2016-02-14 NOTE — Telephone Encounter (Signed)
Patient aware to stop taking medications

## 2016-02-14 NOTE — Telephone Encounter (Signed)
She is allergic to everything else- she can just stop taking.

## 2016-02-17 ENCOUNTER — Ambulatory Visit: Payer: Medicare Other | Admitting: Nurse Practitioner

## 2016-02-22 ENCOUNTER — Other Ambulatory Visit: Payer: Self-pay | Admitting: Nurse Practitioner

## 2016-02-22 DIAGNOSIS — R110 Nausea: Secondary | ICD-10-CM

## 2016-02-24 ENCOUNTER — Other Ambulatory Visit (INDEPENDENT_AMBULATORY_CARE_PROVIDER_SITE_OTHER): Payer: Self-pay | Admitting: *Deleted

## 2016-02-24 ENCOUNTER — Other Ambulatory Visit: Payer: Medicare Other

## 2016-02-24 DIAGNOSIS — K51918 Ulcerative colitis, unspecified with other complication: Secondary | ICD-10-CM

## 2016-02-25 LAB — CBC
HEMATOCRIT: 38.3 % (ref 34.0–46.6)
HEMOGLOBIN: 12.4 g/dL (ref 11.1–15.9)
MCH: 30.1 pg (ref 26.6–33.0)
MCHC: 32.4 g/dL (ref 31.5–35.7)
MCV: 93 fL (ref 79–97)
Platelets: 373 10*3/uL (ref 150–379)
RBC: 4.12 x10E6/uL (ref 3.77–5.28)
RDW: 16.3 % — ABNORMAL HIGH (ref 12.3–15.4)
WBC: 6.8 10*3/uL (ref 3.4–10.8)

## 2016-02-25 LAB — C-REACTIVE PROTEIN: CRP: 5.1 mg/L — ABNORMAL HIGH (ref 0.0–4.9)

## 2016-03-18 ENCOUNTER — Other Ambulatory Visit (INDEPENDENT_AMBULATORY_CARE_PROVIDER_SITE_OTHER): Payer: Self-pay | Admitting: *Deleted

## 2016-03-18 ENCOUNTER — Telehealth (INDEPENDENT_AMBULATORY_CARE_PROVIDER_SITE_OTHER): Payer: Self-pay | Admitting: Internal Medicine

## 2016-03-18 DIAGNOSIS — K5150 Left sided colitis without complications: Secondary | ICD-10-CM

## 2016-03-18 DIAGNOSIS — R1032 Left lower quadrant pain: Secondary | ICD-10-CM

## 2016-03-18 NOTE — Telephone Encounter (Signed)
Patient called and stated that she is having sharp pains in her left side.  She take Oxycodone, but after about 4 hours the pain is back and it just stays there.  [phone]

## 2016-03-18 NOTE — Telephone Encounter (Signed)
Talked with [name]. She states that the pain is her lower left abdomen. This has been going on ,but of late she will take her pain medication then 4 hours later the pain comes back and it will just stay. Her Bm's are normal and she is not having any rectal bleeding.  This was discussed with Dr.Rehman and he ask that a CT Abd /Pelvic be done with and w/o contrast be done. Patient will be made aware. Forwarded to [employer] ,Teaching laboratory technician.

## 2016-03-18 NOTE — Telephone Encounter (Signed)
CT sch'd 03/20/16 at 1 (1245), npo 4 hrs prior, pick up contrast, patient aware

## 2016-03-20 ENCOUNTER — Ambulatory Visit (HOSPITAL_COMMUNITY)
Admission: RE | Admit: 2016-03-20 | Discharge: 2016-03-20 | Disposition: A | Discharge status: Home / Self Care | Payer: Medicare Other | Source: Ambulatory Visit | Attending: Internal Medicine | Admitting: Internal Medicine

## 2016-03-20 ENCOUNTER — Encounter (HOSPITAL_COMMUNITY): Payer: Self-pay

## 2016-03-20 DIAGNOSIS — K5730 Diverticulosis of large intestine without perforation or abscess without bleeding: Secondary | ICD-10-CM | POA: Diagnosis not present

## 2016-03-20 DIAGNOSIS — K869 Disease of pancreas, unspecified: Secondary | ICD-10-CM | POA: Diagnosis not present

## 2016-03-20 DIAGNOSIS — K5150 Left sided colitis without complications: Secondary | ICD-10-CM | POA: Diagnosis not present

## 2016-03-20 DIAGNOSIS — Z9049 Acquired absence of other specified parts of digestive tract: Secondary | ICD-10-CM | POA: Insufficient documentation

## 2016-03-20 DIAGNOSIS — R1032 Left lower quadrant pain: Secondary | ICD-10-CM | POA: Insufficient documentation

## 2016-03-20 DIAGNOSIS — R109 Unspecified abdominal pain: Secondary | ICD-10-CM | POA: Diagnosis not present

## 2016-03-20 HISTORY — DX: Systemic involvement of connective tissue, unspecified: M35.9

## 2016-03-20 LAB — POCT I-STAT CREATININE: CREATININE: 1.3 mg/dL — AB (ref 0.44–1.00)

## 2016-03-20 MED ORDER — IOPAMIDOL (ISOVUE-300) INJECTION 61%
100.0000 mL | Freq: Once | INTRAVENOUS | Status: AC | PRN
  Administered 2016-03-20: 80 mL via INTRAVENOUS

## 2016-03-24 ENCOUNTER — Other Ambulatory Visit (INDEPENDENT_AMBULATORY_CARE_PROVIDER_SITE_OTHER): Payer: Self-pay | Admitting: *Deleted

## 2016-03-24 DIAGNOSIS — K5150 Left sided colitis without complications: Secondary | ICD-10-CM

## 2016-03-24 MED ORDER — OXYCODONE HCL 5 MG PO TABS
5.0000 mg | ORAL_TABLET | Freq: Three times a day (TID) | ORAL | 0 refills | Status: DC | PRN
Start: 2016-03-24 — End: 2016-08-10

## 2016-04-13 ENCOUNTER — Other Ambulatory Visit: Payer: Self-pay | Admitting: Nurse Practitioner

## 2016-04-13 ENCOUNTER — Other Ambulatory Visit (INDEPENDENT_AMBULATORY_CARE_PROVIDER_SITE_OTHER): Payer: Self-pay | Admitting: Internal Medicine

## 2016-04-13 DIAGNOSIS — R110 Nausea: Secondary | ICD-10-CM

## 2016-04-13 DIAGNOSIS — K51919 Ulcerative colitis, unspecified with unspecified complications: Secondary | ICD-10-CM

## 2016-05-14 ENCOUNTER — Encounter: Payer: Self-pay | Admitting: Nurse Practitioner

## 2016-05-14 ENCOUNTER — Ambulatory Visit (INDEPENDENT_AMBULATORY_CARE_PROVIDER_SITE_OTHER): Payer: Medicare Other | Admitting: Nurse Practitioner

## 2016-05-14 VITALS — BP 162/79 | HR 78 | Temp 97.8°F | Ht 68.0 in | Wt 165.0 lb

## 2016-05-14 DIAGNOSIS — F3342 Major depressive disorder, recurrent, in full remission: Secondary | ICD-10-CM | POA: Diagnosis not present

## 2016-05-14 DIAGNOSIS — E785 Hyperlipidemia, unspecified: Secondary | ICD-10-CM | POA: Diagnosis not present

## 2016-05-14 DIAGNOSIS — E1310 Other specified diabetes mellitus with ketoacidosis without coma: Secondary | ICD-10-CM

## 2016-05-14 DIAGNOSIS — I10 Essential (primary) hypertension: Secondary | ICD-10-CM | POA: Diagnosis not present

## 2016-05-14 DIAGNOSIS — K5150 Left sided colitis without complications: Secondary | ICD-10-CM

## 2016-05-14 LAB — BAYER DCA HB A1C WAIVED: HB A1C: 5.9 % (ref <7.0)

## 2016-05-14 MED ORDER — BENAZEPRIL-HYDROCHLOROTHIAZIDE 20-12.5 MG PO TABS: 1.0000 | ORAL_TABLET | Freq: Every day | ORAL | 5 refills | Status: DC

## 2016-05-14 MED ORDER — ALPRAZOLAM 0.5 MG PO TABS: 0.5000 mg | ORAL_TABLET | Freq: Two times a day (BID) | ORAL | 2 refills | Status: DC | PRN

## 2016-05-14 MED ORDER — OXYCODONE HCL 5 MG PO TABS: 5.0000 mg | ORAL_TABLET | Freq: Two times a day (BID) | ORAL | 0 refills | Status: DC

## 2016-05-14 MED ORDER — ESCITALOPRAM OXALATE 20 MG PO TABS: 20.0000 mg | ORAL_TABLET | Freq: Every day | ORAL | 1 refills | Status: DC

## 2016-05-14 NOTE — Addendum Note (Signed)
Addended by: Rolena Infante on: 05/14/2016 05:09 PM   Modules accepted: Orders

## 2016-05-14 NOTE — Patient Instructions (Signed)
Irritable Bowel Syndrome, Adult Irritable bowel syndrome (IBS) is not one specific disease. It is a group of symptoms that affects the organs responsible for digestion (gastrointestinal or GI tract). To regulate how your GI tract works, your body sends signals back and forth between your intestines and your brain. If you have IBS, there may be a problem with these signals. As a result, your GI tract does not function normally. Your intestines may become more sensitive and overreact to certain things. This is especially true when you eat certain foods or when you are under stress. There are four types of IBS. These may be determined based on the consistency of your stool:  IBS with diarrhea.  IBS with constipation.  Mixed IBS.  Unsubtyped IBS. It is important to know which type of IBS you have. Some treatments are more likely to be helpful for certain types of IBS. What are the causes? The exact cause of IBS is not known. What increases the risk? You may have a higher risk of IBS if:  You are a woman.  You are younger than 75 years old.  You have a family history of IBS.  You have mental health problems.  You have had bacterial infection of your GI tract. What are the signs or symptoms? Symptoms of IBS vary from person to person. The main symptom is abdominal pain or discomfort. Additional symptoms usually include one or more of the following:  Diarrhea, constipation, or both.  Abdominal swelling or bloating.  Feeling full or sick after eating a small or regular-size meal.  Frequent gas.  Mucus in the stool.  A feeling of having more stool left after a bowel movement. Symptoms tend to come and go. They may be associated with stress, psychiatric conditions, or nothing at all. How is this diagnosed? There is no specific test to diagnose IBS. Your health care provider will make a diagnosis based on a physical exam, medical history, and your symptoms. You may have other tests to  rule out other conditions that may be causing your symptoms. These may include:  Blood tests.  X-rays.  CT scan.  Endoscopy and colonoscopy. This is a test in which your GI tract is viewed with a long, thin, flexible tube. How is this treated? There is no cure for IBS, but treatment can help relieve symptoms. IBS treatment often includes:  Changes to your diet, such as:  Eating more fiber.  Avoiding foods that cause symptoms.  Drinking more water.  Eating regular, medium-sized portioned meals.  Medicines. These may include:  Fiber supplements if you have constipation.  Medicine to control diarrhea (antidiarrheal medicines).  Medicine to help control muscle spasms in your GI tract (antispasmodic medicines).  Medicines to help with any mental health issues, such as antidepressants or tranquilizers.  Therapy.  Talk therapy may help with anxiety, depression, or other mental health issues that can make IBS symptoms worse.  Stress reduction.  Managing your stress can help keep symptoms under control. Follow these instructions at home:  Take medicines only as directed by your health care provider.  Eat a healthy diet.  Avoid foods and drinks with added sugar.  Include more whole grains, fruits, and vegetables gradually into your diet. This may be especially helpful if you have IBS with constipation.  Avoid any foods and drinks that make your symptoms worse. These may include dairy products and caffeinated or carbonated drinks.  Do not eat large meals.  Drink enough fluid to keep your urine  clear or pale yellow.  Exercise regularly. Ask your health care provider for recommendations of good activities for you.  Keep all follow-up visits as directed by your health care provider. This is important. Contact a health care provider if:  You have constant pain.  You have trouble or pain with swallowing.  You have worsening diarrhea. Get help right away if:  You  have severe and worsening abdominal pain.  You have diarrhea and:  You have a rash, stiff neck, or severe headache.  You are irritable, sleepy, or difficult to awaken.  You are weak, dizzy, or extremely thirsty.  You have bright red blood in your stool or you have black tarry stools.  You have unusual abdominal swelling that is painful.  You vomit continuously.  You vomit blood (hematemesis).  You have both abdominal pain and a fever. This information is not intended to replace advice given to you by your health care provider. Make sure you discuss any questions you have with your health care provider. Document Released: 03/09/2005 Document Revised: 08/09/2015 Document Reviewed: 11/24/2013 Elsevier Interactive Patient Education  2017 [employer].

## 2016-05-14 NOTE — Progress Notes (Signed)
Subjective:    Patient ID: [name], female    DOB: 03/15/1941, 76 y.o.   MRN: [id number]  Patient here today for follow up of chronic medical problems.  Outpatient Encounter Prescriptions as of 02/06/2016  Medication Sig  . ALPRAZolam (XANAX) 0.5 MG tablet Take 1 tablet (0.5 mg total) by mouth 2 (two) times daily as needed. for anxiety  . antiseptic oral rinse (BIOTENE) LIQD 1 application by Mouth Rinse route daily. FOR DRY MOUTH  . B Complex CAPS Take 1 capsule by mouth every morning.   . benazepril-hydrochlorthiazide (LOTENSIN HCT) 20-12.5 MG tablet Take 1 tablet by mouth daily.  . Cholecalciferol (VITAMIN D3) 1000 UNITS CAPS Take 1 capsule by mouth. Patient states that she alternates with Hair, Skin, and Nails.  Marland Kitchen co-enzyme Q-10 50 MG capsule Take 50 mg by mouth once a week.   . dicyclomine (BENTYL) 20 MG tablet TAKE 1/2 TABLET TWICE DAILY BEFORE A MEAL.  Marland Kitchen escitalopram (LEXAPRO) 20 MG tablet Take 1 tablet (20 mg total) by mouth daily.  . hydrocortisone (PROCTOSOL HC) 2.5 % rectal cream Place 1 application rectally 2 (two) times daily.  . methotrexate (RHEUMATREX) 2.5 MG tablet Take 4 tablets (10 mg total) by mouth once a week. Caution:Chemotherapy. Protect from light.  . Multiple Vitamins-Minerals (WOMENS MULTI VITAMIN & MINERAL PO) Take 1 tablet by mouth every other day.   . Omega-3 Fatty Acids (FISH OIL) 1000 MG CPDR Take 1 capsule by mouth every evening.   . ondansetron (ZOFRAN) 4 MG tablet TAKE 1 TABLET TWICE DAILY AS NEEDED FOR NAUSEA OR VOMITING.  Marland Kitchen oxyCODONE (OXY IR/ROXICODONE) 5 MG immediate release tablet Take 1 tablet (5 mg total) by mouth 2 (two) times daily.  Marland Kitchen oxyCODONE (OXY IR/ROXICODONE) 5 MG immediate release tablet Take 1 tablet (5 mg total) by mouth every 4 (four) hours as needed for severe pain.  Marland Kitchen oxyCODONE (ROXICODONE) 5 MG immediate release tablet Take 1 tablet (5 mg total) by mouth every 4 (four) hours as needed for severe pain.  . Probiotic Product  (PROBIOTIC FORMULA PO) Take 1-2 capsules by mouth every morning.   . vitamin A 10000 UNIT capsule Take 10,000 Units by mouth once a week.   . vitamin C (ASCORBIC ACID) 500 MG tablet Take 500 mg by mouth once a week.   . vitamin E 400 UNIT capsule Take 400 Units by mouth every morning.   . zolpidem (AMBIEN) 5 MG tablet TAKE 1 TABLET AT BEDTIME AS NEEDED.   Hypertension  This is a chronic problem. The current episode started more than 1 year ago. The problem is unchanged. The problem is controlled. Pertinent negatives include no chest pain, headaches, neck pain, palpitations or shortness of breath. Risk factors for coronary artery disease include dyslipidemia, post-menopausal state and sedentary lifestyle. Past treatments include ACE inhibitors and diuretics. The current treatment provides moderate improvement. Compliance problems include diet and exercise.  There is no history of CAD/MI, CVA, heart failure or PVD.  Hyperlipidemia  This is a chronic problem. The current episode started more than 1 year ago. The problem is controlled. She has no history of diabetes, hypothyroidism or obesity. Pertinent negatives include no chest pain or shortness of breath. She is currently on no antihyperlipidemic treatment. The current treatment provides moderate improvement of lipids. Compliance problems include adherence to diet and adherence to exercise.  Risk factors for coronary artery disease include dyslipidemia, hypertension and post-menopausal.  Diabetes  She presents for her follow-up diabetic visit. She has  type 2 diabetes mellitus. Pertinent negatives for hypoglycemia include no headaches. Pertinent negatives for diabetes include no chest pain. Symptoms are stable. Pertinent negatives for diabetic complications include no CVA or PVD. Her weight is stable. When asked about meal planning, she reported none. She has not had a previous visit with a dietitian. She rarely participates in exercise. Home blood sugar  record trend: only checks occassionally. Her breakfast blood glucose range is generally 130-140 mg/dl. Her overall blood glucose range is 130-140 mg/dl. An ACE inhibitor/angiotensin II receptor blocker is being taken. She does not see a podiatrist.Eye exam is not current.  ulcerative colitis Dr. [name]-  off of  prednisone- methotreazate working ok for flare ups.. Takes pain medication for the constant pain. Was seen for pain management 05/27/15. Pain assessment: Cause of pain- ulcerative colitis Pain location- abdomen Pain on scale of 1-10- 8/10 Frequency- daily What increases pain- has had flare up and pain has worsened-Had been on antibiotics for sinus infection and that always flares up her IBS What makes pain Better- nothing in particular Effects on ADL - some days pain is so bad she just lays around all day long Any change in general medical condition- none  Current medications- roxicodone 30m Effectiveness of current meds-helps but does not completely relieve pain Adverse reactions form pain meds-- none Morphine equivalent- 20  Pill count performed-No Urine drug screen- No Was the NAspenreviewed- yes  If yes were their any concerning findings? - no suspicious activity  Drepression/GAD lexapor- working well to keep her from worrying so much. Takes xanax as needed at least BID- insurance no longer wants to pay for xanax so need to change. insomnia ambien but only takes occasionally- uses benadryl most of the time.   Review of Systems  HENT: Negative.   Respiratory: Negative for shortness of breath.   Cardiovascular: Negative for chest pain and palpitations.  Gastrointestinal: Positive for constipation, diarrhea and nausea.  Musculoskeletal: Negative for neck pain.  Neurological: Negative for headaches.  Psychiatric/Behavioral: Negative.   All other systems reviewed and are negative.      Objective:   Physical Exam  Constitutional: She is oriented to person, place, and  time. She appears well-developed and well-nourished.  HENT:  Right Ear: Hearing, tympanic membrane, external ear and ear canal normal.  Left Ear: Hearing, tympanic membrane, external ear and ear canal normal.  Nose: Mucosal edema and rhinorrhea present.  Mouth/Throat: Uvula is midline and oropharynx is clear and moist.  Eyes: EOM are normal.  Neck: Trachea normal, normal range of motion and full passive range of motion without pain. Neck supple. No JVD present. Carotid bruit is not present. No thyromegaly present.  Cardiovascular: Normal rate, regular rhythm, normal heart sounds and intact distal pulses.  Exam reveals no gallop and no friction rub.   No murmur heard. Pulmonary/Chest: Effort normal and breath sounds normal.  Abdominal: Soft. Bowel sounds are normal. She exhibits no distension and no mass. There is tenderness (diffuse).  Musculoskeletal: Normal range of motion.  Lymphadenopathy:    She has no cervical adenopathy.  Neurological: She is alert and oriented to person, place, and time. She has normal reflexes.  Skin: Skin is warm and dry.  Psychiatric: She has a normal mood and affect. Her behavior is normal. Judgment and thought content normal.   BP (!) 162/79   Pulse 78   Temp 97.8 F (36.6 C) (Oral)   Ht 5' 8"  (1.727 m)   Wt 165 lb (74.8 kg)  BMI 25.09 kg/m     HGBA1C 6.1%    Assessment & Plan:  1. Essential hypertension Low sodium diet - benazepril-hydrochlorthiazide (LOTENSIN HCT) 20-12.5 MG tablet; Take 1 tablet by mouth daily.  Dispense: 30 tablet; Refill: 5  2. ULCERATIVE COLITIS, LEFT SIDED Follow up with rehman Reviewed pain contract- patient was told cannot get pain meds from anyone else - oxyCODONE (OXY IR/ROXICODONE) 5 MG immediate release tablet; Take 1 tablet (5 mg total) by mouth 2 (two) times daily.  Dispense: 60 tablet; Refill: 0 - oxyCODONE (OXY IR/ROXICODONE) 5 MG immediate release tablet; Take 1 tablet (5 mg total) by mouth 2 (two) times daily.   Dispense: 60 tablet; Refill: 0  3. Recurrent major depressive disorder, in full remission ([place]) Stress management - escitalopram (LEXAPRO) 20 MG tablet; Take 1 tablet (20 mg total) by mouth daily.  Dispense: 90 tablet; Refill: 1 - ALPRAZolam (XANAX) 0.5 MG tablet; Take 1 tablet (0.5 mg total) by mouth 2 (two) times daily as needed. for anxiety  Dispense: 60 tablet; Refill: 2    Labs pending Health maintenance reviewed Diet and exercise encouraged Continue all meds Follow up  In 3 months   [place], FNP

## 2016-05-15 LAB — CMP14+EGFR
ALBUMIN: 3.9 g/dL (ref 3.5–4.8)
ALK PHOS: 62 IU/L (ref 39–117)
ALT: 15 IU/L (ref 0–32)
AST: 24 IU/L (ref 0–40)
Albumin/Globulin Ratio: 1.8 (ref 1.2–2.2)
BILIRUBIN TOTAL: 0.4 mg/dL (ref 0.0–1.2)
BUN / CREAT RATIO: 15 (ref 12–28)
BUN: 19 mg/dL (ref 8–27)
CHLORIDE: 102 mmol/L (ref 96–106)
CO2: 24 mmol/L (ref 18–29)
CREATININE: 1.28 mg/dL — AB (ref 0.57–1.00)
Calcium: 8.7 mg/dL (ref 8.7–10.3)
GFR calc Af Amer: 48 — ABNORMAL LOW (ref >59)
GFR calc non Af Amer: 41 — ABNORMAL LOW (ref >59)
GLUCOSE: 148 mg/dL — AB (ref 65–99)
Globulin, Total: 2.2 (ref 1.5–4.5)
Potassium: 4.5 mmol/L (ref 3.5–5.2)
Sodium: 142 mmol/L (ref 134–144)
Total Protein: 6.1 g/dL (ref 6.0–8.5)

## 2016-05-15 LAB — LIPID PANEL
CHOLESTEROL TOTAL: 196 mg/dL (ref 100–199)
Chol/HDL Ratio: 5.6 — ABNORMAL HIGH (ref 0.0–4.4)
HDL: 35 mg/dL — ABNORMAL LOW (ref >39)
LDL Calculated: 109 — ABNORMAL HIGH (ref 0–99)
TRIGLYCERIDES: 259 mg/dL — AB (ref 0–149)
VLDL CHOLESTEROL CAL: 52 — AB (ref 5–40)

## 2016-05-19 ENCOUNTER — Other Ambulatory Visit: Payer: Self-pay | Admitting: Nurse Practitioner

## 2016-05-19 MED ORDER — FENOFIBRATE 145 MG PO TABS: 145.0000 mg | ORAL_TABLET | Freq: Every day | ORAL | 5 refills | Status: DC

## 2016-06-04 ENCOUNTER — Encounter: Payer: Self-pay | Admitting: *Deleted

## 2016-06-11 ENCOUNTER — Other Ambulatory Visit: Payer: Self-pay | Admitting: Nurse Practitioner

## 2016-06-11 DIAGNOSIS — F5101 Primary insomnia: Secondary | ICD-10-CM

## 2016-06-12 NOTE — Telephone Encounter (Signed)
Last filled 04/13/16, last seen 05/14/16. Call in

## 2016-06-15 NOTE — Telephone Encounter (Signed)
Please call in ambien with 1 refills 

## 2016-06-15 NOTE — Telephone Encounter (Signed)
Refill called to [facility]

## 2016-07-23 DIAGNOSIS — H43813 Vitreous degeneration, bilateral: Secondary | ICD-10-CM | POA: Diagnosis not present

## 2016-07-23 DIAGNOSIS — Z961 Presence of intraocular lens: Secondary | ICD-10-CM | POA: Diagnosis not present

## 2016-08-10 ENCOUNTER — Encounter: Payer: Self-pay | Admitting: Nurse Practitioner

## 2016-08-10 ENCOUNTER — Ambulatory Visit (INDEPENDENT_AMBULATORY_CARE_PROVIDER_SITE_OTHER): Payer: Medicare Other | Admitting: Internal Medicine

## 2016-08-10 ENCOUNTER — Ambulatory Visit (INDEPENDENT_AMBULATORY_CARE_PROVIDER_SITE_OTHER): Payer: Medicare Other | Admitting: Nurse Practitioner

## 2016-08-10 VITALS — BP 135/83 | HR 80 | Temp 97.0°F | Ht 68.0 in | Wt 163.0 lb

## 2016-08-10 DIAGNOSIS — K5150 Left sided colitis without complications: Secondary | ICD-10-CM

## 2016-08-10 DIAGNOSIS — M797 Fibromyalgia: Secondary | ICD-10-CM

## 2016-08-10 DIAGNOSIS — F5101 Primary insomnia: Secondary | ICD-10-CM | POA: Diagnosis not present

## 2016-08-10 DIAGNOSIS — N183 Chronic kidney disease, stage 3 unspecified: Secondary | ICD-10-CM

## 2016-08-10 DIAGNOSIS — I10 Essential (primary) hypertension: Secondary | ICD-10-CM | POA: Diagnosis not present

## 2016-08-10 DIAGNOSIS — F3342 Major depressive disorder, recurrent, in full remission: Secondary | ICD-10-CM | POA: Diagnosis not present

## 2016-08-10 DIAGNOSIS — E785 Hyperlipidemia, unspecified: Secondary | ICD-10-CM

## 2016-08-10 DIAGNOSIS — E8881 Metabolic syndrome: Secondary | ICD-10-CM

## 2016-08-10 LAB — CMP14+EGFR
ALK PHOS: 76 IU/L (ref 39–117)
ALT: 17 IU/L (ref 0–32)
AST: 21 IU/L (ref 0–40)
Albumin/Globulin Ratio: 1.8 (ref 1.2–2.2)
Albumin: 4.2 g/dL (ref 3.5–4.8)
BUN/Creatinine Ratio: 15 (ref 12–28)
BUN: 20 mg/dL (ref 8–27)
Bilirubin Total: 0.5 mg/dL (ref 0.0–1.2)
CALCIUM: 9.7 mg/dL (ref 8.7–10.3)
CO2: 22 mmol/L (ref 18–29)
CREATININE: 1.34 mg/dL — AB (ref 0.57–1.00)
Chloride: 101 mmol/L (ref 96–106)
GFR calc Af Amer: 45 mL/min/{1.73_m2} — ABNORMAL LOW (ref >59)
GFR, EST NON AFRICAN AMERICAN: 39 mL/min/{1.73_m2} — AB (ref >59)
GLUCOSE: 133 mg/dL — AB (ref 65–99)
Globulin, Total: 2.4 g/dL (ref 1.5–4.5)
Potassium: 5.6 mmol/L — ABNORMAL HIGH (ref 3.5–5.2)
Sodium: 140 mmol/L (ref 134–144)
Total Protein: 6.6 g/dL (ref 6.0–8.5)

## 2016-08-10 LAB — LIPID PANEL
CHOL/HDL RATIO: 5.2 ratio — AB (ref 0.0–4.4)
CHOLESTEROL TOTAL: 228 mg/dL — AB (ref 100–199)
HDL: 44 mg/dL (ref >39)
LDL CALC: 136 mg/dL — AB (ref 0–99)
TRIGLYCERIDES: 239 mg/dL — AB (ref 0–149)
VLDL CHOLESTEROL CAL: 48 mg/dL — AB (ref 5–40)

## 2016-08-10 MED ORDER — ZOLPIDEM TARTRATE 5 MG PO TABS: ORAL_TABLET | ORAL | 1 refills | Status: DC

## 2016-08-10 MED ORDER — BENAZEPRIL-HYDROCHLOROTHIAZIDE 20-12.5 MG PO TABS: 1.0000 | ORAL_TABLET | Freq: Every day | ORAL | 5 refills | Status: DC

## 2016-08-10 MED ORDER — OXYCODONE HCL 5 MG PO TABS: 5.0000 mg | ORAL_TABLET | Freq: Three times a day (TID) | ORAL | 0 refills | Status: DC | PRN

## 2016-08-10 MED ORDER — OXYCODONE HCL 5 MG PO TABS
5.0000 mg | ORAL_TABLET | Freq: Two times a day (BID) | ORAL | 0 refills | Status: DC
Start: 2016-08-10 — End: 2016-11-10

## 2016-08-10 MED ORDER — OXYCODONE HCL 5 MG PO TABS
5.0000 mg | ORAL_TABLET | Freq: Two times a day (BID) | ORAL | 0 refills | Status: DC
Start: 2016-08-10 — End: 2016-08-24

## 2016-08-10 MED ORDER — FENOFIBRATE 145 MG PO TABS: 145.0000 mg | ORAL_TABLET | Freq: Every day | ORAL | 5 refills | Status: DC

## 2016-08-10 MED ORDER — ALPRAZOLAM 0.5 MG PO TABS: 0.5000 mg | ORAL_TABLET | Freq: Two times a day (BID) | ORAL | 2 refills | Status: DC | PRN

## 2016-08-10 NOTE — Patient Instructions (Signed)
Stress and Stress Management Stress is a normal reaction to life events. It is what you feel when life demands more than you are used to or more than you can handle. Some stress can be useful. For example, the stress reaction can help you catch the last bus of the day, study for a test, or meet a deadline at work. But stress that occurs too often or for too long can cause problems. It can affect your emotional health and interfere with relationships and normal daily activities. Too much stress can weaken your immune system and increase your risk for physical illness. If you already have a medical problem, stress can make it worse. What are the causes? All sorts of life events may cause stress. An event that causes stress for one person may not be stressful for another person. Major life events commonly cause stress. These may be positive or negative. Examples include losing your job, moving into a new home, getting married, having a baby, or losing a loved one. Less obvious life events may also cause stress, especially if they occur day after day or in combination. Examples include working long hours, driving in traffic, caring for children, being in debt, or being in a difficult relationship. What are the signs or symptoms? Stress may cause emotional symptoms including, the following:  Anxiety. This is feeling worried, afraid, on edge, overwhelmed, or out of control.  Anger. This is feeling irritated or impatient.  Depression. This is feeling sad, down, helpless, or guilty.  Difficulty focusing, remembering, or making decisions. Stress may cause physical symptoms, including the following:  Aches and pains. These may affect your head, neck, back, stomach, or other areas of your body.  Tight muscles or clenched jaw.  Low energy or trouble sleeping. Stress may cause unhealthy behaviors, including the following:  Eating to feel better (overeating) or skipping meals.  Sleeping too little, too  much, or both.  Working too much or putting off tasks (procrastination).  Smoking, drinking alcohol, or using drugs to feel better. How is this diagnosed? Stress is diagnosed through an assessment by your health care provider. Your health care provider will ask questions about your symptoms and any stressful life events.Your health care provider will also ask about your medical history and may order blood tests or other tests. Certain medical conditions and medicine can cause physical symptoms similar to stress. Mental illness can cause emotional symptoms and unhealthy behaviors similar to stress. Your health care provider may refer you to a mental health professional for further evaluation. How is this treated? Stress management is the recommended treatment for stress.The goals of stress management are reducing stressful life events and coping with stress in healthy ways. Techniques for reducing stressful life events include the following:  Stress identification. Self-monitor for stress and identify what causes stress for you. These skills may help you to avoid some stressful events.  Time management. Set your priorities, keep a calendar of events, and learn to say "no." These tools can help you avoid making too many commitments. Techniques for coping with stress include the following:  Rethinking the problem. Try to think realistically about stressful events rather than ignoring them or overreacting. Try to find the positives in a stressful situation rather than focusing on the negatives.  Exercise. Physical exercise can release both physical and emotional tension. The key is to find a form of exercise you enjoy and do it regularly.  Relaxation techniques. These relax the body and mind. Examples include yoga,  meditation, tai chi, biofeedback, deep breathing, progressive muscle relaxation, listening to music, being out in nature, journaling, and other hobbies. Again, the key is to find one or  more that you enjoy and can do regularly.  Healthy lifestyle. Eat a balanced diet, get plenty of sleep, and do not smoke. Avoid using alcohol or drugs to relax.  Strong support network. Spend time with family, friends, or other people you enjoy being around.Express your feelings and talk things over with someone you trust. Counseling or talktherapy with a mental health professional may be helpful if you are having difficulty managing stress on your own. Medicine is typically not recommended for the treatment of stress.Talk to your health care provider if you think you need medicine for symptoms of stress. Follow these instructions at home:  Keep all follow-up visits as directed by your health care provider.  Take all medicines as directed by your health care provider. Contact a health care provider if:  Your symptoms get worse or you start having new symptoms.  You feel overwhelmed by your problems and can no longer manage them on your own. Get help right away if:  You feel like hurting yourself or someone else. This information is not intended to replace advice given to you by your health care provider. Make sure you discuss any questions you have with your health care provider. Document Released: 09/02/2000 Document Revised: 08/15/2015 Document Reviewed: 11/01/2012 Elsevier Interactive Patient Education  2017 [employer].

## 2016-08-10 NOTE — Progress Notes (Signed)
Subjective:    Patient ID: [name], female    DOB: 06/23/1940, 76 y.o.   MRN: [id number]  HPI  [name] is here today for follow up of chronic medical problem.  Outpatient Encounter Prescriptions as of 08/10/2016  Medication Sig  . ALPRAZolam (XANAX) 0.5 MG tablet Take 1 tablet (0.5 mg total) by mouth 2 (two) times daily as needed. for anxiety  . antiseptic oral rinse (BIOTENE) LIQD 1 application by Mouth Rinse route daily. FOR DRY MOUTH  . antiseptic oral rinse (CPC / CETYLPYRIDINIUM CHLORIDE 0.05%) 0.05 % LIQD solution Use as directed in the mouth or throat.  . B Complex CAPS Take 1 capsule by mouth every morning.   . benazepril-hydrochlorthiazide (LOTENSIN HCT) 20-12.5 MG tablet Take 1 tablet by mouth daily.  . Cholecalciferol (VITAMIN D3) 1000 UNITS CAPS Take 1 capsule by mouth. Patient states that she alternates with Hair, Skin, and Nails.  Marland Kitchen co-enzyme Q-10 50 MG capsule Take 50 mg by mouth once a week.   . dicyclomine (BENTYL) 20 MG tablet TAKE 1/2 TABLET TWICE DAILY BEFORE A MEAL.  . fenofibrate (TRICOR) 145 MG tablet Take 1 tablet (145 mg total) by mouth daily.  . hydrocortisone (PROCTOSOL HC) 2.5 % rectal cream Place 1 application rectally 2 (two) times daily.  . methotrexate (RHEUMATREX) 2.5 MG tablet Patient is to take 4 tablets by mouth once a week.  . Multiple Vitamin (THERA) TABS Take by mouth.  . Multiple Vitamins-Minerals (WOMENS MULTI VITAMIN & MINERAL PO) Take 1 tablet by mouth every other day.   . Omega-3 Fatty Acids (FISH OIL) 1000 MG CPDR Take 1 capsule by mouth every evening.   . ondansetron (ZOFRAN) 4 MG tablet TAKE 1 TABLET TWICE DAILY AS NEEDED FOR NAUSEA AND VOMITING.  Marland Kitchen oxyCODONE (OXY IR/ROXICODONE) 5 MG immediate release tablet Take 1 tablet (5 mg total) by mouth 2 (two) times daily.  Marland Kitchen oxyCODONE (OXY IR/ROXICODONE) 5 MG immediate release tablet Take 1 tablet (5 mg total) by mouth 2 (two) times daily.  Marland Kitchen oxyCODONE (OXY IR/ROXICODONE) 5 MG immediate  release tablet Take 1 tablet (5 mg total) by mouth 2 (two) times daily.  Marland Kitchen oxyCODONE (ROXICODONE) 5 MG immediate release tablet Take 1 tablet (5 mg total) by mouth 3 (three) times daily as needed for severe pain.  . Probiotic Product (PROBIOTIC FORMULA PO) Take 1-2 capsules by mouth every morning.   Marland Kitchen PROBIOTIC PRODUCT PO Take by mouth.  . vitamin A 10000 UNIT capsule Take 10,000 Units by mouth once a week.   . vitamin C (ASCORBIC ACID) 500 MG tablet Take 500 mg by mouth once a week.   . vitamin E 400 UNIT capsule Take 400 Units by mouth every morning.   . zolpidem (AMBIEN) 5 MG tablet TAKE (1) TABLET DAILY AT BEDTIME AS NEEDED.   1. Essential hypertension  NO c/o chest pain, SOB or HA. She does not check blood pressures at home  2. ULCERATIVE COLITIS, LEFT SIDED  She is actually doing well right now- they have her on methyltrexate which has reaaly helped but patient does not like side effects and has actually weaned herself down to 2 tablets weekly. Se plans to discuss this with DR. Ramond at her next appointment.  3. CKD (chronic kidney disease) stage 3, GFR 30-59 ml/min  Currently watching labs  4. Recurrent major depressive disorder, in full remission ([place])  She is currently on no meds for this- takes xanax as needed. She was on lexapro and  did not see any difference so she just stopped taking  5. Fibromyalgia   has constant muscle pain but continues to do what she needs to do despite pain.  6. Hyperlipidemia with target LDL less than 100   does not watch diet very closely  7. Primary insomnia   Patient has been on [place] for many years. Says that she cannot sleep without it.  8.  Metabolic syndrome  again does not watch diet     New complaints: None today    Review of Systems  Constitutional: Negative.  Negative for diaphoresis.  HENT: Negative.   Eyes: Negative for pain.  Respiratory: Negative.  Negative for shortness of breath.   Cardiovascular: Negative for chest pain,  palpitations and leg swelling.  Gastrointestinal: Positive for diarrhea. Negative for abdominal pain.  Endocrine: Negative for polydipsia.  Genitourinary: Negative.   Musculoskeletal: Positive for myalgias.  Skin: Negative for rash.  Neurological: Negative.  Negative for dizziness, weakness and headaches.  Hematological: Does not bruise/bleed easily.  Psychiatric/Behavioral: Negative.   All other systems reviewed and are negative.      Objective:   Physical Exam  Constitutional: She is oriented to person, place, and time. She appears well-developed and well-nourished.  HENT:  Nose: Nose normal.  Mouth/Throat: Oropharynx is clear and moist.  Eyes: EOM are normal.  Neck: Trachea normal, normal range of motion and full passive range of motion without pain. Neck supple. No JVD present. Carotid bruit is not present. No thyromegaly present.  Cardiovascular: Normal rate, regular rhythm, normal heart sounds and intact distal pulses.  Exam reveals no gallop and no friction rub.   No murmur heard. Pulmonary/Chest: Effort normal and breath sounds normal.  Abdominal: Soft. Bowel sounds are normal. She exhibits no distension and no mass. There is tenderness (veryu mild diffuse abdominal tenderness). There is no rebound.  Musculoskeletal: Normal range of motion.  Lymphadenopathy:    She has no cervical adenopathy.  Neurological: She is alert and oriented to person, place, and time. She has normal reflexes.  Skin: Skin is warm and dry.  Psychiatric: She has a normal mood and affect. Her behavior is normal. Judgment and thought content normal.   BP 135/83   Pulse 80   Temp 97 F (36.1 C) (Oral)   Ht 5' 8"  (1.727 m)   Wt 163 lb (73.9 kg)   BMI 24.78 kg/m         Assessment & Plan:  1. Essential hypertension Low sodium diet - benazepril-hydrochlorthiazide (LOTENSIN HCT) 20-12.5 MG tablet; Take 1 tablet by mouth daily.  Dispense: 30 tablet; Refill: 5 - CMP14+EGFR  2. ULCERATIVE  COLITIS, LEFT SIDED Keep follow up with RD. Rehman to discuss medications - oxyCODONE (ROXICODONE) 5 MG immediate release tablet; Take 1 tablet (5 mg total) by mouth 3 (three) times daily as needed for severe pain.  Dispense: 60 tablet; Refill: 0 - oxyCODONE (OXY IR/ROXICODONE) 5 MG immediate release tablet; Take 1 tablet (5 mg total) by mouth 2 (two) times daily.  Dispense: 60 tablet; Refill: 0 - oxyCODONE (OXY IR/ROXICODONE) 5 MG immediate release tablet; Take 1 tablet (5 mg total) by mouth 2 (two) times daily.  Dispense: 60 tablet; Refill: 0  3. CKD (chronic kidney disease) stage 3, GFR 30-59 ml/min Will continue to watch labs  4. Recurrent major depressive disorder, in full remission ([place]) Stress management - ALPRAZolam (XANAX) 0.5 MG tablet; Take 1 tablet (0.5 mg total) by mouth 2 (two) times daily as needed. for anxiety  Dispense: 60 tablet; Refill: 2  5. Fibromyalgia Exercise will help with muscle pain  6. Hyperlipidemia with target LDL less than 100 Low fta diet - Lipid panel  7. Primary insomnia Bedtime routine doiscussed - zolpidem (AMBIEN) 5 MG tablet; TAKE (1) TABLET DAILY AT BEDTIME AS NEEDED.  Dispense: 30 tablet; Refill: 1  8. Metabolic syndrome Watch carbs in diet  * discussed fact that patient is on pain meds, xanax and ambien- she was told cannot remain on all of these if want sto continue pain meds- she understands and will work on weaning off some over the next few months  Labs pending Health maintenance reviewed Diet and exercise encouraged Continue all meds Follow up  In 3 months   Rebecca, FNP

## 2016-08-11 ENCOUNTER — Ambulatory Visit: Payer: Medicare Other | Admitting: Nurse Practitioner

## 2016-08-11 ENCOUNTER — Ambulatory Visit (INDEPENDENT_AMBULATORY_CARE_PROVIDER_SITE_OTHER): Payer: Medicare Other | Admitting: Internal Medicine

## 2016-08-12 ENCOUNTER — Ambulatory Visit: Payer: Medicare Other | Admitting: Nurse Practitioner

## 2016-08-13 ENCOUNTER — Ambulatory Visit: Payer: Medicare Other | Admitting: Nurse Practitioner

## 2016-08-24 ENCOUNTER — Encounter (INDEPENDENT_AMBULATORY_CARE_PROVIDER_SITE_OTHER): Payer: Self-pay | Admitting: Internal Medicine

## 2016-08-24 ENCOUNTER — Ambulatory Visit (INDEPENDENT_AMBULATORY_CARE_PROVIDER_SITE_OTHER): Payer: Medicare Other | Admitting: Internal Medicine

## 2016-08-24 VITALS — BP 134/90 | HR 68 | Temp 97.6°F | Resp 18 | Ht 68.0 in | Wt 162.5 lb

## 2016-08-24 DIAGNOSIS — K51918 Ulcerative colitis, unspecified with other complication: Secondary | ICD-10-CM | POA: Diagnosis not present

## 2016-08-24 DIAGNOSIS — K589 Irritable bowel syndrome without diarrhea: Secondary | ICD-10-CM | POA: Diagnosis not present

## 2016-08-24 DIAGNOSIS — R110 Nausea: Secondary | ICD-10-CM

## 2016-08-24 MED ORDER — ONDANSETRON HCL 4 MG PO TABS: ORAL_TABLET | ORAL | 1 refills | Status: DC

## 2016-08-24 NOTE — Progress Notes (Signed)
Presenting complaint;  Follow-up for ulcerative colitis and IBS.   Subjective:  Patient is 76 year old Caucasian female was ulcerative colitis and IBS and is here for scheduled visit. She was last seen on 02/11/2016. She states she is not taking Finofibrate causative side effects. Patient advised to call PCP and let them know. She is now on 5 mg of methotrexate every week and still complaining of soreness in her mouth. She also feel her skin burns if she gets out in the sun. She states she is doing well and wants to come off methotrexate. He is having 2-3 bowel movements per day. She has not seen blood in her stools and several months. She is getting acupuncture once a week and it helps with muscle and joint pain. Her appetite is good. She has gained 3 pounds since her last visit. She still has spells of nausea relieved with ondansetron. She needs refill on this medication. Nausea is never associated with vomiting. She states she is taking Xanax only on as-needed basis. She states his cousin's wife has dementia and it has been source of stress for her but she decided to be less involved so that she would not get sick again. She received letter from her insurance at dicyclomine not be refilled until it is authorized. She is not having any side effects with this medication.    Current Medications: Outpatient Encounter Prescriptions as of 08/24/2016  Medication Sig  . ALPRAZolam (XANAX) 0.5 MG tablet Take 1 tablet (0.5 mg total) by mouth 2 (two) times daily as needed. for anxiety  . antiseptic oral rinse (BIOTENE) LIQD 1 application by Mouth Rinse route daily. FOR DRY MOUTH  . B Complex CAPS Take 1 capsule by mouth every morning.   . benazepril-hydrochlorthiazide (LOTENSIN HCT) 20-12.5 MG tablet Take 1 tablet by mouth daily.  . Cholecalciferol (VITAMIN D3) 2000 units TABS Take 1 capsule by mouth daily. Patient states that she alternates with Hair, Skin, and Nails.  Marland Kitchen co-enzyme Q-10 50 MG capsule  Take 50 mg by mouth once a week.   . dicyclomine (BENTYL) 20 MG tablet TAKE 1/2 TABLET TWICE DAILY BEFORE A MEAL.  . hydrocortisone (PROCTOSOL HC) 2.5 % rectal cream Place 1 application rectally 2 (two) times daily.  . methotrexate (RHEUMATREX) 2.5 MG tablet Patient is to take 4 tablets by mouth once a week.  . Multiple Vitamin (THERA) TABS Take by mouth.  . Multiple Vitamins-Minerals (WOMENS MULTI VITAMIN & MINERAL PO) Take 1 tablet by mouth every other day.   . Omega-3 Fatty Acids (FISH OIL) 1000 MG CPDR Take 3 capsules by mouth. Patient takes 1 in the morning , 1 in the afternoon, and 1 at night.  . ondansetron (ZOFRAN) 4 MG tablet TAKE 1 TABLET TWICE DAILY AS NEEDED FOR NAUSEA AND VOMITING.  . OVER THE COUNTER MEDICATION Vitamin K -2 Patient states that she takes daily with her Vitamin D.  . oxyCODONE (OXY IR/ROXICODONE) 5 MG immediate release tablet Take 1 tablet (5 mg total) by mouth 2 (two) times daily.  Marland Kitchen oxyCODONE (ROXICODONE) 5 MG immediate release tablet Take 1 tablet (5 mg total) by mouth 3 (three) times daily as needed for severe pain.  . Probiotic Product (PROBIOTIC FORMULA PO) Take 1-2 capsules by mouth every morning.   . vitamin A 10000 UNIT capsule Take 10,000 Units by mouth once a week.   . vitamin C (ASCORBIC ACID) 500 MG tablet Take 500 mg by mouth once a week.   . vitamin E 400  UNIT capsule Take 400 Units by mouth every morning.   . zolpidem (AMBIEN) 5 MG tablet TAKE (1) TABLET DAILY AT BEDTIME AS NEEDED.  . fenofibrate (TRICOR) 145 MG tablet Take 1 tablet (145 mg total) by mouth daily. (Patient not taking: Reported on 08/24/2016)  . [DISCONTINUED] antiseptic oral rinse (CPC / CETYLPYRIDINIUM CHLORIDE 0.05%) 0.05 % LIQD solution Use as directed in the mouth or throat.  . [DISCONTINUED] oxyCODONE (OXY IR/ROXICODONE) 5 MG immediate release tablet Take 1 tablet (5 mg total) by mouth 2 (two) times daily. (Patient not taking: Reported on 08/24/2016)  . [DISCONTINUED] PROBIOTIC PRODUCT  PO Take by mouth.   No facility-administered encounter medications on file as of 08/24/2016.      Objective: Blood pressure 134/90, pulse 68, temperature 97.6 F (36.4 C), temperature source Oral, resp. rate 18, height 5' 8"  (1.727 m), weight 162 lb 8 oz (73.7 kg). Patient is alert and in no acute distress. Conjunctiva is pink. Sclera is nonicteric Oropharyngeal mucosa is normal. No neck masses or thyromegaly noted. Cardiac exam with regular rhythm normal S1 and S2. No murmur or gallop noted. Lungs are clear to auscultation. Abdomen is symmetrical and soft with mild LLQ tenderness. No organomegaly or masses. No LE edema or clubbing noted.  Labs/studies Results: Lab data from 08/10/2016  Bilirubin 0.5, AP 76, AST 21, ALT 17 and albumin 4.2.    Assessment:  #1. Ulcerative colitis. She has distal disease and it has been difficult to control but she has been in remission for several months. She is on fairly low dose of methotrexate and still having side effects and wants to come off it realizing that her disease could relapse. She is willing to take this chance. #2. Irritable bowel syndrome. She was having 10-12 bowel movements per day and now she is having 2-3 formed stools daily. She needs approval with her insurance company regarding this medication. #3. Nausea without vomiting. This symptom may be due to stress. Doubt GI etiology. No further workup unless this symptom becomes frequent.   Plan:  Patient will go to lab for CBC and CRP. Discontinue methotrexate. New prescription given for ondansetron. Patient advised to call if she develops rectal bleeding or diarrhea. Will contact her insurance to get dicyclomine approved. Office visit in 6 months.

## 2016-08-24 NOTE — Patient Instructions (Signed)
Physician will call with results of blood test. Notify if you have diarrhea or rectal bleeding.

## 2016-08-25 LAB — CBC
HCT: 40.1 % (ref 35.0–45.0)
HEMOGLOBIN: 13.1 g/dL (ref 11.7–15.5)
MCH: 30.4 pg (ref 27.0–33.0)
MCHC: 32.7 g/dL (ref 32.0–36.0)
MCV: 93 fL (ref 80.0–100.0)
MPV: 10.2 fL (ref 7.5–12.5)
Platelets: 361 10*3/uL (ref 140–400)
RBC: 4.31 MIL/uL (ref 3.80–5.10)
RDW: 14.1 % (ref 11.0–15.0)
WBC: 7.4 10*3/uL (ref 3.8–10.8)

## 2016-08-25 LAB — C-REACTIVE PROTEIN: CRP: 8.5 mg/L — AB (ref <8.0)

## 2016-08-27 ENCOUNTER — Other Ambulatory Visit (INDEPENDENT_AMBULATORY_CARE_PROVIDER_SITE_OTHER): Payer: Self-pay | Admitting: Internal Medicine

## 2016-08-27 MED ORDER — MESALAMINE 1.2 G PO TBEC: 2.4000 g | DELAYED_RELEASE_TABLET | Freq: Two times a day (BID) | ORAL | 11 refills | Status: DC

## 2016-08-28 ENCOUNTER — Telehealth (INDEPENDENT_AMBULATORY_CARE_PROVIDER_SITE_OTHER): Payer: Self-pay | Admitting: *Deleted

## 2016-08-28 NOTE — Telephone Encounter (Signed)
Patient called the office on 08/27/2016. She states that her [name] was going to cost her # 382.00 per month. She does not feel that she can afford this. Per Dr.Rehman- Call in [place] - Patient is to take 4 by mouth twice a day. This was called to [employer]. Patient is aware.

## 2016-09-04 ENCOUNTER — Telehealth (INDEPENDENT_AMBULATORY_CARE_PROVIDER_SITE_OTHER): Payer: Self-pay | Admitting: *Deleted

## 2016-09-04 NOTE — Telephone Encounter (Signed)
Delzicol was not cover for the patient. The cost was over $1000.00. Patient called her [employer] - the mesalamine they cover is Sulfasalazine.  Per Dr.Rehman - call in Sulfasalazine 500 mg - patient will take 1 by mouth three times a day. 1 month 5 refills. Folic Acid 1 mg - take 1 by mouth daily. #30 5 refills.  This was called to [employer]. I also saw that the patient has allergy to sulfonamides derivatives, pharmacist made aware. He is going to run it and see if it is kicks it out. Will let us know and he will also make the patient aware.

## 2016-09-14 ENCOUNTER — Telehealth: Payer: Self-pay | Admitting: Nurse Practitioner

## 2016-09-14 NOTE — Telephone Encounter (Signed)
Pt having sinus sxs Headache, teeth hurt, neck hurts, hoarse Some cough, not bringing anything up Has had fever & chills at times but not now Would like zpak called to [place]

## 2016-09-14 NOTE — Telephone Encounter (Signed)
ntbs

## 2016-09-14 NOTE — Telephone Encounter (Signed)
PT is aware that she would have to be seen and she says that she doesn't feel like driving down here.

## 2016-09-16 ENCOUNTER — Encounter: Payer: Medicare Other | Admitting: *Deleted

## 2016-09-30 ENCOUNTER — Telehealth: Payer: Self-pay

## 2016-09-30 ENCOUNTER — Encounter: Payer: Self-pay | Admitting: Family Medicine

## 2016-09-30 ENCOUNTER — Ambulatory Visit: Payer: Medicare Other | Admitting: Family Medicine

## 2016-09-30 ENCOUNTER — Ambulatory Visit (INDEPENDENT_AMBULATORY_CARE_PROVIDER_SITE_OTHER): Payer: Medicare Other | Admitting: Family Medicine

## 2016-09-30 VITALS — BP 160/96 | HR 78 | Temp 96.7°F | Ht 68.0 in | Wt 165.2 lb

## 2016-09-30 DIAGNOSIS — J0140 Acute pansinusitis, unspecified: Secondary | ICD-10-CM

## 2016-09-30 MED ORDER — LEVOFLOXACIN 250 MG PO TABS: ORAL_TABLET | ORAL | 0 refills | Status: DC

## 2016-09-30 NOTE — Patient Instructions (Signed)
Great to see you!   Sinusitis, Adult Sinusitis is soreness and inflammation of your sinuses. Sinuses are hollow spaces in the bones around your face. They are located:  Around your eyes.  In the middle of your forehead.  Behind your nose.  In your cheekbones.  Your sinuses and nasal passages are lined with a stringy fluid (mucus). Mucus normally drains out of your sinuses. When your nasal tissues get inflamed or swollen, the mucus can get trapped or blocked so air cannot flow through your sinuses. This lets bacteria, viruses, and funguses grow, and that leads to infection. Follow these instructions at home: Medicines  Take, use, or apply over-the-counter and prescription medicines only as told by your doctor. These may include nasal sprays.  If you were prescribed an antibiotic medicine, take it as told by your doctor. Do not stop taking the antibiotic even if you start to feel better. Hydrate and Humidify  Drink enough water to keep your pee (urine) clear or pale yellow.  Use a cool mist humidifier to keep the humidity level in your home above 50%.  Breathe in steam for 10-15 minutes, 3-4 times a day or as told by your doctor. You can do this in the bathroom while a hot shower is running.  Try not to spend time in cool or dry air. Rest  Rest as much as possible.  Sleep with your head raised (elevated).  Make sure to get enough sleep each night. General instructions  Put a warm, moist washcloth on your face 3-4 times a day or as told by your doctor. This will help with discomfort.  Wash your hands often with soap and water. If there is no soap and water, use hand sanitizer.  Do not smoke. Avoid being around people who are smoking (secondhand smoke).  Keep all follow-up visits as told by your doctor. This is important. Contact a doctor if:  You have a fever.  Your symptoms get worse.  Your symptoms do not get better within 10 days. Get help right away if:  You  have a very bad headache.  You cannot stop throwing up (vomiting).  You have pain or swelling around your face or eyes.  You have trouble seeing.  You feel confused.  Your neck is stiff.  You have trouble breathing. This information is not intended to replace advice given to you by your health care provider. Make sure you discuss any questions you have with your health care provider. Document Released: 08/26/2007 Document Revised: 11/03/2015 Document Reviewed: 01/02/2015 Elsevier Interactive Patient Education  [employer].

## 2016-09-30 NOTE — Telephone Encounter (Signed)
Oxycodone will be refilled on the 19th but patient is going out of town on the 14th and would like it refilled early. Please advise

## 2016-09-30 NOTE — Progress Notes (Signed)
   HPI  Patient presents today with acute illness.  Patient reports 2-3 weeks facial pain and pressure. She also has cough and congestion. As well as frontal headache.  Patient denies severe shortness of breath. She has lots of facial discomfort. She's tolerating food and fluids like usual. She has multiple medication allergies.  PMH: Smoking status noted ROS: Per HPI  Objective: BP (!) 160/96   Pulse 78   Temp (!) 96.7 F (35.9 C) (Oral)   Ht 5' 8"  (1.727 m)   Wt 165 lb 3.2 oz (74.9 kg)   BMI 25.12 kg/m  Gen: NAD, alert, cooperative with exam HEENT: NCAT, tenderness to palpation of bilateral maxillary and frontal sinuses, oropharynx moist and clear. CV: RRR, good S1/S2, no murmur Resp: CTABL, no wheezes, non-labored Ext: No edema, warm Neuro: Alert and oriented, No gross deficits  Assessment and plan:  # Acute sinusitis Treat with Levaquin given allergies, CrCl 45 so renally dosed ( 500 day 1 and 250 daily after that) Return to clinic with any concerns.    Meds ordered this encounter  Medications  . DISCONTD: levofloxacin (LEVAQUIN) 250 MG tablet    Sig: 2 tabs on day 1 and then 1 tab daily    Dispense:  11 tablet    Refill:  0    Renal dosing, please call if not available  . levofloxacin (LEVAQUIN) 250 MG tablet    Sig: 2 tabs on day 1 and then 1 tab daily    Dispense:  11 tablet    Refill:  0    Renal dosing, please call if not available    Laroy Apple, MD [facility] 09/30/2016, 1:46 PM

## 2016-10-01 ENCOUNTER — Telehealth (INDEPENDENT_AMBULATORY_CARE_PROVIDER_SITE_OTHER): Payer: Self-pay | Admitting: *Deleted

## 2016-10-01 MED ORDER — CEFDINIR 300 MG PO CAPS: 300.0000 mg | ORAL_CAPSULE | Freq: Two times a day (BID) | ORAL | 0 refills | Status: DC

## 2016-10-01 NOTE — Telephone Encounter (Signed)
We have been working on a PA for the medication generic Bentyl for patient.. They have denied this request and have indicated that we did not send enough information. We sent all that was requested. They now say that Dr.Rehman will have to do a Peer to Peer review. Patient called on 09/30/2016 wanted me to call , I have no answer.

## 2016-10-01 NOTE — Telephone Encounter (Signed)
Called and changed levaquin to omnicef- patient will let me know if has allergic reaction- she said levaquin made her feel like her throat was swelling.

## 2016-10-01 NOTE — Telephone Encounter (Signed)
Insurance will not let her get filled early andpay for it. It is ok with me that she get early if insurance will alow

## 2016-10-01 NOTE — Addendum Note (Signed)
Addended by: [name] on: 10/01/2016 01:53 PM   Modules accepted: Orders

## 2016-10-01 NOTE — Telephone Encounter (Signed)
Patient aware- is going to call the pharmacy and see how much it would be for self pay and if its reasonable can MMM send it in? Also- States she was prescribed levaquin yesterday for her sinus infection and she think she is allergic to it. States her face feels on fire and she broke out in a rash on her arms. Patient has stopped taking it and is scared to take benadryl since she is allergic to so many medications. Is wanting a different antibiotic sent in. Please advise

## 2016-11-10 ENCOUNTER — Ambulatory Visit (INDEPENDENT_AMBULATORY_CARE_PROVIDER_SITE_OTHER): Payer: Medicare Other | Admitting: Nurse Practitioner

## 2016-11-10 ENCOUNTER — Encounter: Payer: Self-pay | Admitting: Nurse Practitioner

## 2016-11-10 VITALS — BP 139/92 | HR 76 | Temp 96.7°F | Ht 68.0 in | Wt 166.0 lb

## 2016-11-10 DIAGNOSIS — K5150 Left sided colitis without complications: Secondary | ICD-10-CM

## 2016-11-10 DIAGNOSIS — N183 Chronic kidney disease, stage 3 unspecified: Secondary | ICD-10-CM

## 2016-11-10 DIAGNOSIS — E785 Hyperlipidemia, unspecified: Secondary | ICD-10-CM

## 2016-11-10 DIAGNOSIS — F3342 Major depressive disorder, recurrent, in full remission: Secondary | ICD-10-CM

## 2016-11-10 DIAGNOSIS — M797 Fibromyalgia: Secondary | ICD-10-CM | POA: Diagnosis not present

## 2016-11-10 DIAGNOSIS — E8881 Metabolic syndrome: Secondary | ICD-10-CM | POA: Diagnosis not present

## 2016-11-10 DIAGNOSIS — I10 Essential (primary) hypertension: Secondary | ICD-10-CM | POA: Diagnosis not present

## 2016-11-10 DIAGNOSIS — F5101 Primary insomnia: Secondary | ICD-10-CM | POA: Diagnosis not present

## 2016-11-10 MED ORDER — OXYCODONE HCL 5 MG PO TABS: 5.0000 mg | ORAL_TABLET | Freq: Two times a day (BID) | ORAL | 0 refills | Status: DC

## 2016-11-10 MED ORDER — ALPRAZOLAM 0.5 MG PO TABS: 0.5000 mg | ORAL_TABLET | Freq: Two times a day (BID) | ORAL | 2 refills | Status: DC | PRN

## 2016-11-10 MED ORDER — FENOFIBRATE 145 MG PO TABS: 145.0000 mg | ORAL_TABLET | Freq: Every day | ORAL | 5 refills | Status: DC

## 2016-11-10 MED ORDER — OXYCODONE HCL 5 MG PO TABS: 5.0000 mg | ORAL_TABLET | ORAL | 0 refills | Status: DC | PRN

## 2016-11-10 MED ORDER — ZOLPIDEM TARTRATE 5 MG PO TABS: ORAL_TABLET | ORAL | 1 refills | Status: DC

## 2016-11-10 MED ORDER — BENAZEPRIL-HYDROCHLOROTHIAZIDE 20-12.5 MG PO TABS: 1.0000 | ORAL_TABLET | Freq: Every day | ORAL | 5 refills | Status: DC

## 2016-11-10 NOTE — Progress Notes (Signed)
Subjective:    Patient ID: [name], female    DOB: 03/29/1940, 76 y.o.   MRN: [id number]  HPI Seini Lannom is here today for follow up of chronic medical problem.  Outpatient Encounter Prescriptions as of 11/10/2016  Medication Sig  . ALPRAZolam (XANAX) 0.5 MG tablet Take 1 tablet (0.5 mg total) by mouth 2 (two) times daily as needed. for anxiety  . antiseptic oral rinse (BIOTENE) LIQD 1 application by Mouth Rinse route daily. FOR DRY MOUTH  . B Complex CAPS Take 1 capsule by mouth every morning.   . benazepril-hydrochlorthiazide (LOTENSIN HCT) 20-12.5 MG tablet Take 1 tablet by mouth daily.  . Cholecalciferol (VITAMIN D3) 2000 units TABS Take 1 capsule by mouth daily. Patient states that she alternates with Hair, Skin, and Nails.  Marland Kitchen co-enzyme Q-10 50 MG capsule Take 50 mg by mouth once a week.   . dicyclomine (BENTYL) 20 MG tablet TAKE 1/2 TABLET TWICE DAILY BEFORE A MEAL.  . fenofibrate (TRICOR) 145 MG tablet Take 1 tablet (145 mg total) by mouth daily.  . hydrocortisone (PROCTOSOL HC) 2.5 % rectal cream Place 1 application rectally 2 (two) times daily.  . Multiple Vitamin (THERA) TABS Take by mouth.  . Multiple Vitamins-Minerals (WOMENS MULTI VITAMIN & MINERAL PO) Take 1 tablet by mouth every other day.   . Omega-3 Fatty Acids (FISH OIL) 1000 MG CPDR Take 3 capsules by mouth. Patient takes 1 in the morning , 1 in the afternoon, and 1 at night.  . ondansetron (ZOFRAN) 4 MG tablet TAKE 1 TABLET TWICE DAILY AS NEEDED FOR NAUSEA AND VOMITING.  . OVER THE COUNTER MEDICATION Vitamin K -2 Patient states that she takes daily with her Vitamin D.  . oxyCODONE (OXY IR/ROXICODONE) 5 MG immediate release tablet Take 1 tablet (5 mg total) by mouth 2 (two) times daily.  . Probiotic Product (PROBIOTIC FORMULA PO) Take 1-2 capsules by mouth every morning.   . vitamin A 10000 UNIT capsule Take 10,000 Units by mouth once a week.   . vitamin C (ASCORBIC ACID) 500 MG tablet Take 500 mg by mouth once a  week.   . vitamin E 400 UNIT capsule Take 400 Units by mouth every morning.   . zolpidem (AMBIEN) 5 MG tablet TAKE (1) TABLET DAILY AT BEDTIME AS NEEDED.  . [DISCONTINUED] cefdinir (OMNICEF) 300 MG capsule Take 1 capsule (300 mg total) by mouth 2 (two) times daily. 1 po BID   No facility-administered encounter medications on file as of 11/10/2016.     1. Essential hypertension  Blood pressure managed with Lotensin HCT.  Does not check blood pressure at home.  2. CKD (chronic kidney disease) stage 3, GFR 30-59 ml/min  Currently watching labs.  3. Fibromyalgia  Constant muscle pain, but does not wish to take medication.  4. Recurrent major depressive disorder, in full remission ([place])  Managing symptoms without medication.  Takes Xanax as needed.  5. Metabolic syndrome  Does not watch diet.  6. Hyperlipidemia with target LDL less than 100  Does not monitor diet.    7. Primary insomnia  Patient has not taken ambien in 1 month, but hasn't had it refilled lately.  Wants a refill today.    New complaints: Left lower quadrant pain attributed to UC flare.  Patient not currently taking medication for this, but did take methotrexate in the past.  Social history: Lives by herself   Review of Systems  Constitutional: Negative for activity change.  Respiratory: Negative for  cough and choking.   Cardiovascular: Negative for chest pain and palpitations.  Neurological: Negative for dizziness, light-headedness and headaches.  All other systems reviewed and are negative.      Objective:   Physical Exam  Constitutional: She is oriented to person, place, and time. She appears well-developed and well-nourished. No distress.  HENT:  Head: Normocephalic.  Right Ear: External ear normal.  Left Ear: External ear normal.  Mouth/Throat: Oropharynx is clear and moist.  Eyes: Pupils are equal, round, and reactive to light.  Neck: Normal range of motion. Neck supple. No thyromegaly present.    Cardiovascular: Normal rate, regular rhythm, normal heart sounds and intact distal pulses.   No murmur heard. Pulmonary/Chest: Effort normal and breath sounds normal. No respiratory distress.  Abdominal: Soft. Bowel sounds are normal. She exhibits no distension. There is no tenderness.  Musculoskeletal: Normal range of motion. She exhibits no edema.  Lymphadenopathy:    She has no cervical adenopathy.  Neurological: She is alert and oriented to person, place, and time.  Skin: Skin is warm and dry.  Psychiatric: She has a normal mood and affect. Her behavior is normal.   BP (!) 139/92   Pulse 76   Temp (!) 96.7 F (35.9 C) (Oral)   Ht 5' 8"  (1.727 m)   Wt 166 lb (75.3 kg)   BMI 25.24 kg/m      Assessment & Plan:  1. Essential hypertension Low sodium diet - benazepril-hydrochlorthiazide (LOTENSIN HCT) 20-12.5 MG tablet; Take 1 tablet by mouth daily.  Dispense: 30 tablet; Refill: 5  2. CKD (chronic kidney disease) stage 3, GFR 30-59 ml/min Currently watching labs  3. Fibromyalgia exercise  4. Recurrent major depressive disorder, in full remission [facility]) Patient knows that he cannot stay on xanax with pain meds - ALPRAZolam (XANAX) 0.5 MG tablet; Take 1 tablet (0.5 mg total) by mouth 2 (two) times daily as needed. for anxiety  Dispense: 60 tablet; Refill: 2  5. Metabolic syndrome Watch carbs in diet  6. Hyperlipidemia with target LDL less than 100 low fat diet  7. Primary insomnia Bedtime routine - zolpidem (AMBIEN) 5 MG tablet; TAKE (1) TABLET DAILY AT BEDTIME AS NEEDED.  Dispense: 30 tablet; Refill: 1  8. ULCERATIVE COLITIS, LEFT SIDED Watch diet - oxyCODONE (OXY IR/ROXICODONE) 5 MG immediate release tablet; Take 1 tablet (5 mg total) by mouth 2 (two) times daily.  Dispense: 60 tablet; Refill: 0    Labs pending Health maintenance reviewed Diet and exercise encouraged Continue all meds Follow up  In 3 months   [place], FNP

## 2016-11-10 NOTE — Addendum Note (Signed)
Addended by: [name] on: 11/10/2016 04:14 PM   Modules accepted: Orders

## 2016-11-10 NOTE — Patient Instructions (Signed)
What You Need to Know About Prescription Opioid Pain Medicine Opioids are powerful medicines that are used to treat moderate to severe pain. Opioids should be taken with the supervision of a trained health care provider. They should be taken for the shortest period of time as possible. This is because opioids can be addictive and the longer you take opioids, the greater your risk of addiction (opioid use disorder). What do opioids do? Opioids help reduce or eliminate pain. When used for short periods of time, they can help you:  Sleep better.  Do better in physical or occupational therapy.  Feel better in the first few days after an injury.  Recover from surgery.  What kind of problems can opioids cause? Opioids can cause side effects, such as:  Constipation.  Nausea.  Vomiting.  Drowsiness.  Confusion.  Opioid use disorder.  Breathing difficulties (respiratory depression).  Using opioid pain medicines for longer than 3 days increases your risk of these side effects. Taking opioid pain medicine for a long period of time can affect your ability to do daily tasks. It also puts you at risk for:  Car accidents.  Heart attack.  Overdose, which can sometimes lead to death.  What can increase my risk for developing problems while taking opioids? You may be at an especially high risk for problems while taking opioids if you:  Are over the age of 35.  Are pregnant.  Have kidney or liver disease.  Have certain mental health conditions, such as depression or anxiety.  Have a history of substance use disorder.  Have had an opioid overdose in the past.  How do I stop taking opioids if I have been taking them for a long time? If you have been taking opioid medicine for more than a few weeks, you may need to slowly stop taking them (taper). Tapering your use of opioids can decrease your chances of experiencing withdrawal symptoms, such as:  Abdominal pain and  cramping.  Nausea.  Sweating.  Sleepiness.  Restlessness.  Uncontrollable shaking (tremors).  Cravings for the medicine.  Do not attempt to taper your use of opioids on your own. Talk with your health care provider about how to do this. Your health care provider may prescribe a step-down schedule based on how much medicine you are taking and how long you have been taking it. What are the benefits of stopping the use of opioids? By switching from opioid pain medicine to non-opioid pain management options, you will decrease your risk of accidents and injuries associated with long-term opioid use. You will also be able to:  Monitor your pain more accurately and know when to seek medical care if it is not improving.  Decrease risk to others around you. Having opioids in the home increases the risk for accidental or intentional use or overdose by others.  How can I treat pain without opioids? Pain can be managed with many types of alternative treatments. Ask your health care provider to refer you to one or more specialists who can help you manage pain through:  Physical or occupational therapy.  Counseling (cognitive-behavioral therapy).  Good nutrition.  Biofeedback.  Massage.  Meditation.  Non-opioid medicine.  Following a gentle exercise program.  Where can I get support? If you have been taking opioids for a long time, you may benefit from receiving support for quitting from a local support group or counselor. Ask your health care provider for a referral to these resources in your area. When should I  seek medical care? Seek medical care right away if you are taking opioids and you experience any of the following:  Difficulty breathing.  Breathing that is more shallow or slower than normal.  A very slow heartbeat (pulse).  Severe confusion.  Unconsciousness.  Sleepiness.  Difficulty waking from sleep.  Slurred speech.  Nausea and vomiting.  Cold, clammy  skin.  Blue lips or fingernails.  Limpness.  Abnormally small pupils.  If you think that you or someone else may have taken too much of an opioid medicine, get medical help right away. Do not wait to see if the symptoms go away on their own. Call your local emergency services (911 in the [place] or call the hotline of the [facility] [phone] in the [place].). Where can I get more information? To learn more about opioid medicines, visit the Centers for Disease Control and Prevention web site Opioid Basics at https://keller-santana.com/. Summary  Opioid medicines can help you manage moderate-to-severe pain for a short period of time.  Taking opioid pain medicine for a long period of time puts you at risk for unintentional accidents, injury, and even death.  If you think that you or someone else may have taken too much of an opioid, get medical help right away. This information is not intended to replace advice given to you by your health care provider. Make sure you discuss any questions you have with your health care provider. Document Released: 04/05/2015 Document Revised: 11/01/2015 Document Reviewed: 10/19/2014 Elsevier Interactive Patient Education  2017 [employer].

## 2016-12-07 ENCOUNTER — Other Ambulatory Visit: Payer: Self-pay | Admitting: Nurse Practitioner

## 2016-12-07 DIAGNOSIS — F5101 Primary insomnia: Secondary | ICD-10-CM

## 2017-01-06 ENCOUNTER — Other Ambulatory Visit: Payer: Self-pay | Admitting: Nurse Practitioner

## 2017-01-06 DIAGNOSIS — F5101 Primary insomnia: Secondary | ICD-10-CM

## 2017-01-07 NOTE — Telephone Encounter (Signed)
Please call in ambien with 1 refills 

## 2017-01-08 ENCOUNTER — Other Ambulatory Visit: Payer: Self-pay | Admitting: Nurse Practitioner

## 2017-01-08 DIAGNOSIS — F5101 Primary insomnia: Secondary | ICD-10-CM

## 2017-01-08 NOTE — Telephone Encounter (Signed)
Last seen 11/10/16  MMM If approved route to nurse to call into [place]

## 2017-01-11 NOTE — Telephone Encounter (Signed)
Please call in ambien with 1 refills 

## 2017-01-11 NOTE — Telephone Encounter (Signed)
Left message on pharmacy voice mail for refill on Ambien.

## 2017-01-14 ENCOUNTER — Other Ambulatory Visit: Payer: Self-pay | Admitting: Nurse Practitioner

## 2017-01-14 DIAGNOSIS — F3342 Major depressive disorder, recurrent, in full remission: Secondary | ICD-10-CM

## 2017-01-15 NOTE — Telephone Encounter (Signed)
Please call in alprazolam with 1 refills

## 2017-01-15 NOTE — Telephone Encounter (Signed)
rx called into pharmacy

## 2017-01-19 NOTE — Telephone Encounter (Signed)
Clearing from my in basket was called into [place] on 01/08/17

## 2017-02-09 ENCOUNTER — Encounter: Payer: Self-pay | Admitting: Nurse Practitioner

## 2017-02-09 ENCOUNTER — Ambulatory Visit: Payer: Medicare Other | Admitting: Nurse Practitioner

## 2017-02-09 VITALS — BP 125/73 | HR 88 | Temp 97.3°F | Ht 68.0 in | Wt 170.0 lb

## 2017-02-09 DIAGNOSIS — M797 Fibromyalgia: Secondary | ICD-10-CM

## 2017-02-09 DIAGNOSIS — E785 Hyperlipidemia, unspecified: Secondary | ICD-10-CM

## 2017-02-09 DIAGNOSIS — F3342 Major depressive disorder, recurrent, in full remission: Secondary | ICD-10-CM | POA: Diagnosis not present

## 2017-02-09 DIAGNOSIS — E8881 Metabolic syndrome: Secondary | ICD-10-CM

## 2017-02-09 DIAGNOSIS — N183 Chronic kidney disease, stage 3 unspecified: Secondary | ICD-10-CM

## 2017-02-09 DIAGNOSIS — K5150 Left sided colitis without complications: Secondary | ICD-10-CM | POA: Diagnosis not present

## 2017-02-09 DIAGNOSIS — I10 Essential (primary) hypertension: Secondary | ICD-10-CM | POA: Diagnosis not present

## 2017-02-09 DIAGNOSIS — F5101 Primary insomnia: Secondary | ICD-10-CM | POA: Diagnosis not present

## 2017-02-09 LAB — BAYER DCA HB A1C WAIVED: HB A1C (BAYER DCA - WAIVED): 7.2 % — ABNORMAL HIGH (ref <7.0)

## 2017-02-09 MED ORDER — DICYCLOMINE HCL 20 MG PO TABS: ORAL_TABLET | ORAL | 5 refills | Status: DC

## 2017-02-09 MED ORDER — OXYCODONE HCL 5 MG PO TABS: 5.0000 mg | ORAL_TABLET | Freq: Two times a day (BID) | ORAL | 0 refills | Status: DC

## 2017-02-09 MED ORDER — ZOLPIDEM TARTRATE 5 MG PO TABS: 5.0000 mg | ORAL_TABLET | Freq: Every evening | ORAL | 1 refills | Status: DC | PRN

## 2017-02-09 MED ORDER — OXYCODONE HCL 5 MG PO TABS: 5.0000 mg | ORAL_TABLET | ORAL | 0 refills | Status: DC | PRN

## 2017-02-09 MED ORDER — ALPRAZOLAM 0.5 MG PO TABS: ORAL_TABLET | ORAL | 2 refills | Status: DC

## 2017-02-09 NOTE — Patient Instructions (Signed)

## 2017-02-09 NOTE — Progress Notes (Signed)
Subjective:    Patient ID: [name], female    DOB: 12/16/1940, 76 y.o.   MRN: [id number]  HPI  Seerat Peaden is here today for follow up of chronic medical problem.  Outpatient Encounter Medications as of 02/09/2017  Medication Sig  . ALPRAZolam (XANAX) 0.5 MG tablet Take 1 Tablet by mouth 2 times a day as needed FOR ANXIETY  . antiseptic oral rinse (BIOTENE) LIQD 1 application by Mouth Rinse route daily. FOR DRY MOUTH  . B Complex CAPS Take 1 capsule by mouth every morning.   . benazepril-hydrochlorthiazide (LOTENSIN HCT) 20-12.5 MG tablet Take 1 tablet by mouth daily.  . Cholecalciferol (VITAMIN D3) 2000 units TABS Take 1 capsule by mouth daily. Patient states that she alternates with Hair, Skin, and Nails.  Marland Kitchen co-enzyme Q-10 50 MG capsule Take 50 mg by mouth once a week.   . dicyclomine (BENTYL) 20 MG tablet TAKE 1/2 TABLET TWICE DAILY BEFORE A MEAL.  . fenofibrate (TRICOR) 145 MG tablet Take 1 tablet (145 mg total) by mouth daily.  . hydrocortisone (PROCTOSOL HC) 2.5 % rectal cream Place 1 application rectally 2 (two) times daily.  . Multiple Vitamin (THERA) TABS Take by mouth.  . Multiple Vitamins-Minerals (WOMENS MULTI VITAMIN & MINERAL PO) Take 1 tablet by mouth every other day.   . Omega-3 Fatty Acids (FISH OIL) 1000 MG CPDR Take 3 capsules by mouth. Patient takes 1 in the morning , 1 in the afternoon, and 1 at night.  . ondansetron (ZOFRAN) 4 MG tablet TAKE 1 TABLET TWICE DAILY AS NEEDED FOR NAUSEA AND VOMITING.  . OVER THE COUNTER MEDICATION Vitamin K -2 Patient states that she takes daily with her Vitamin D.  . oxyCODONE (OXY IR/ROXICODONE) 5 MG immediate release tablet Take 1 tablet (5 mg total) by mouth 2 (two) times daily.  Marland Kitchen oxyCODONE (ROXICODONE) 5 MG immediate release tablet Take 1 tablet (5 mg total) by mouth every 4 (four) hours as needed for severe pain.  Marland Kitchen oxyCODONE (ROXICODONE) 5 MG immediate release tablet Take 1 tablet (5 mg total) by mouth every 4 (four) hours  as needed for severe pain.  . Probiotic Product (PROBIOTIC FORMULA PO) Take 1-2 capsules by mouth every morning.   . vitamin A 10000 UNIT capsule Take 10,000 Units by mouth once a week.   . vitamin C (ASCORBIC ACID) 500 MG tablet Take 500 mg by mouth once a week.   . vitamin E 400 UNIT capsule Take 400 Units by mouth every morning.   . zolpidem (AMBIEN) 5 MG tablet Take 1 Tablet by mouth at bedtime as needed   No facility-administered encounter medications on file as of 02/09/2017.     1. Essential hypertension  No c/o chest pain, sob or headache. Does not check blood pressure at home. BP Readings from Last 3 Encounters:  11/10/16 (!) 139/92  09/30/16 (!) 160/96  08/24/16 134/90     2. ULCERATIVE COLITIS, LEFT SIDED  She has severe ulcerative colitis and that is what she takes pain meds for  3. CKD (chronic kidney disease) stage 3, GFR 30-59 ml/min ([facility])  We are currently just watching labs  4. Fibromyalgia  Has muscle pain everyday- the pain meds for ulcerative colitis helps with her muscle pain  5. Recurrent major depressive disorder, in full remission ([place])  She is only on xanax- I have tried to get her to take ssri for example but she refuses  6. Metabolic syndrome  Last HOZY2Q was 5.9%  7. Hyperlipidemia with target LDL less than 100  Not watching fats in diet just avoids things that flare up her colitis  8. Primary insomnia  She takes ambien at night to sleep    New complaints: None today  Social history: Lives alone    Review of Systems  Constitutional: Negative for activity change and appetite change.  HENT: Negative.   Eyes: Negative for pain.  Respiratory: Negative for shortness of breath.   Cardiovascular: Negative for chest pain, palpitations and leg swelling.  Gastrointestinal: Negative for abdominal pain.  Endocrine: Negative for polydipsia.  Genitourinary: Negative.   Skin: Negative for rash.  Neurological: Negative for dizziness, weakness and  headaches.  Hematological: Does not bruise/bleed easily.  Psychiatric/Behavioral: Negative.   All other systems reviewed and are negative.      Objective:   Physical Exam  Constitutional: She is oriented to person, place, and time. She appears well-developed and well-nourished.  HENT:  Nose: Nose normal.  Mouth/Throat: Oropharynx is clear and moist.  Eyes: EOM are normal.  Neck: Trachea normal, normal range of motion and full passive range of motion without pain. Neck supple. No JVD present. Carotid bruit is not present. No thyromegaly present.  Cardiovascular: Normal rate, regular rhythm, normal heart sounds and intact distal pulses. Exam reveals no gallop and no friction rub.  No murmur heard. Pulmonary/Chest: Effort normal and breath sounds normal.  Abdominal: Soft. Bowel sounds are normal. She exhibits no distension and no mass. There is tenderness (diffuse tenderness thorughout colon).  Musculoskeletal: Normal range of motion.  Lymphadenopathy:    She has no cervical adenopathy.  Neurological: She is alert and oriented to person, place, and time. She has normal reflexes.  Skin: Skin is warm and dry.  Psychiatric: She has a normal mood and affect. Her behavior is normal. Judgment and thought content normal.   BP 125/73   Pulse 88   Temp (!) 97.3 F (36.3 C) (Oral)   Ht _0  (1.727 m)   Wt 170 lb (77.1 kg)   BMI 25.85 kg/m       Assessment & Plan:  1. Essential hypertension Low sodium diet - CMP14+EGFR  2. ULCERATIVE COLITIS, LEFT SIDED Continue to watch diet - dicyclomine (BENTYL) 20 MG tablet; TAKE 1/2 TABLET TWICE DAILY BEFORE A MEAL.  Dispense: 30 tablet; Refill: 5 - oxyCODONE (ROXICODONE) 5 MG immediate release tablet; Take 1 tablet (5 mg total) by mouth every 4 (four) hours as needed for severe pain.  Dispense: 60 tablet; Refill: 0 - oxyCODONE (OXY IR/ROXICODONE) 5 MG immediate release tablet; Take 1 tablet (5 mg total) by mouth 2 (two) times daily.  Dispense:  60 tablet; Refill: 0 - oxyCODONE (ROXICODONE) 5 MG immediate release tablet; Take 1 tablet (5 mg total) by mouth every 4 (four) hours as needed for severe pain.  Dispense: 60 tablet; Refill: 0  3. CKD (chronic kidney disease) stage 3, GFR 30-59 ml/min ([facility]) Labs pending  4. Fibromyalgia Exercise as tolerated  5. Recurrent major depressive disorder, in full remission ([place]) Stress management - ALPRAZolam (XANAX) 0.5 MG tablet; Take 1 Tablet by mouth 2 times a day as needed FOR ANXIETY  Dispense: 60 tablet; Refill: 2  6. Metabolic syndrome - Bayer DCA Hb A1c Waived  7. Hyperlipidemia with target LDL less than 100 Low fat diet - Lipid panel  8. Primary insomnia Bedtime orutine - zolpidem (AMBIEN) 5 MG tablet; Take 1 tablet (5 mg total) by mouth at bedtime as needed.  Dispense: 30  tablet; Refill: 1  * have discussed with patient about coming off of xanax and or ambien but she has been on for over 15 years and says that she cannot stop them- will continue to encourage.  Labs pending Health maintenance reviewed Diet and exercise encouraged Continue all meds Follow up  In 3 months   [place], FNP

## 2017-02-10 LAB — CMP14+EGFR
A/G RATIO: 1.8 (ref 1.2–2.2)
ALK PHOS: 84 IU/L (ref 39–117)
ALT: 18 IU/L (ref 0–32)
AST: 19 IU/L (ref 0–40)
Albumin: 4.3 g/dL (ref 3.5–4.8)
BILIRUBIN TOTAL: 0.4 mg/dL (ref 0.0–1.2)
BUN/Creatinine Ratio: 15 (ref 12–28)
BUN: 21 mg/dL (ref 8–27)
CHLORIDE: 105 mmol/L (ref 96–106)
CO2: 25 mmol/L (ref 20–29)
Calcium: 9.5 mg/dL (ref 8.7–10.3)
Creatinine, Ser: 1.39 mg/dL — ABNORMAL HIGH (ref 0.57–1.00)
GFR calc Af Amer: 43 mL/min/{1.73_m2} — ABNORMAL LOW (ref >59)
GFR, EST NON AFRICAN AMERICAN: 37 mL/min/{1.73_m2} — AB (ref >59)
GLOBULIN, TOTAL: 2.4 g/dL (ref 1.5–4.5)
Glucose: 116 mg/dL — ABNORMAL HIGH (ref 65–99)
POTASSIUM: 5 mmol/L (ref 3.5–5.2)
SODIUM: 143 mmol/L (ref 134–144)
Total Protein: 6.7 g/dL (ref 6.0–8.5)

## 2017-02-10 LAB — LIPID PANEL
CHOL/HDL RATIO: 5.9 ratio — AB (ref 0.0–4.4)
CHOLESTEROL TOTAL: 218 mg/dL — AB (ref 100–199)
HDL: 37 mg/dL — AB (ref >39)
LDL Calculated: 111 mg/dL — ABNORMAL HIGH (ref 0–99)
TRIGLYCERIDES: 351 mg/dL — AB (ref 0–149)
VLDL Cholesterol Cal: 70 mg/dL — ABNORMAL HIGH (ref 5–40)

## 2017-02-23 ENCOUNTER — Ambulatory Visit (INDEPENDENT_AMBULATORY_CARE_PROVIDER_SITE_OTHER): Payer: Medicare Other | Admitting: Internal Medicine

## 2017-04-05 ENCOUNTER — Ambulatory Visit (INDEPENDENT_AMBULATORY_CARE_PROVIDER_SITE_OTHER): Payer: Medicare Other | Admitting: Internal Medicine

## 2017-04-05 ENCOUNTER — Encounter (INDEPENDENT_AMBULATORY_CARE_PROVIDER_SITE_OTHER): Payer: Self-pay | Admitting: Internal Medicine

## 2017-04-05 VITALS — BP 128/90 | HR 69 | Temp 97.5°F | Resp 18 | Ht 68.0 in | Wt 167.2 lb

## 2017-04-05 DIAGNOSIS — K5130 Ulcerative (chronic) rectosigmoiditis without complications: Secondary | ICD-10-CM

## 2017-04-05 DIAGNOSIS — R7309 Other abnormal glucose: Secondary | ICD-10-CM

## 2017-04-05 DIAGNOSIS — K649 Unspecified hemorrhoids: Secondary | ICD-10-CM

## 2017-04-05 DIAGNOSIS — K5150 Left sided colitis without complications: Secondary | ICD-10-CM

## 2017-04-05 DIAGNOSIS — K589 Irritable bowel syndrome without diarrhea: Secondary | ICD-10-CM | POA: Diagnosis not present

## 2017-04-05 DIAGNOSIS — R110 Nausea: Secondary | ICD-10-CM

## 2017-04-05 MED ORDER — DICYCLOMINE HCL 20 MG PO TABS: ORAL_TABLET | ORAL | 5 refills | Status: DC

## 2017-04-05 MED ORDER — HYDROCORTISONE 2.5 % RE CREA: 1.0000 "application " | TOPICAL_CREAM | Freq: Two times a day (BID) | RECTAL | 1 refills | Status: DC

## 2017-04-05 MED ORDER — ONDANSETRON HCL 4 MG PO TABS: ORAL_TABLET | ORAL | 1 refills | Status: DC

## 2017-04-05 NOTE — Patient Instructions (Addendum)
Physician will call with results of blood test. Hemoccult x1.

## 2017-04-05 NOTE — Progress Notes (Signed)
Presenting complaint;  Follow-up for ulcerative colitis and IBS. Patient also complains of intermittent nausea.  Subjective:  Jawanna is 77 year old Caucasian female who was at least 7-year history of ulcerative colitis which has been difficult to treat.  She was last seen in June 2018 when methotrexate was discontinued.  I wanted her to go back on oral mesalamine but her co-pay was $380 and another preparation one  thousand dollars a month for another preparation and she could not afford these medications. She says she is doing well without any therapy.  She is having 1-2 formed stools per day.  She has intermittent postprandial cramping and urgency with certain foods.  She needs a refill on dicyclomine which has helped with the symptoms.  She takes pain medication primarily for back pain.  She has not passed any blood per rectum in 7-8 months.  Her appetite is good.  She has gained 5 pounds since her last visit.  She has intermittent nausea and needs a refill on ondansetron.  She states she has intermittent perianal discomfort from hemorrhoids and needs refill on ProctoCream. She is not aware that last A1c was 7.2.   Current Medications: Outpatient Encounter Medications as of 04/05/2017  Medication Sig  . ALPRAZolam (XANAX) 0.5 MG tablet Take 1 Tablet by mouth 2 times a day as needed FOR ANXIETY  . antiseptic oral rinse (BIOTENE) LIQD 1 application by Mouth Rinse route daily. FOR DRY MOUTH  . B Complex CAPS Take 1 capsule by mouth every morning.   . benazepril-hydrochlorthiazide (LOTENSIN HCT) 20-12.5 MG tablet Take 1 tablet by mouth daily.  . Cholecalciferol (VITAMIN D3) 2000 units TABS Take 1 capsule by mouth daily. Patient states that she alternates with Hair, Skin, and Nails.  Marland Kitchen co-enzyme Q-10 50 MG capsule Take 50 mg by mouth once a week.   . dicyclomine (BENTYL) 20 MG tablet TAKE 1/2 TABLET TWICE DAILY BEFORE A MEAL.  . fenofibrate (TRICOR) 145 MG tablet Take 1 tablet (145 mg total) by  mouth daily.  . hydrocortisone (PROCTOSOL HC) 2.5 % rectal cream Place 1 application rectally 2 (two) times daily.  . Multiple Vitamin (THERA) TABS Take by mouth.  . Multiple Vitamins-Minerals (WOMENS MULTI VITAMIN & MINERAL PO) Take 1 tablet by mouth every other day.   . Omega-3 Fatty Acids (FISH OIL) 1000 MG CPDR Take 3 capsules by mouth. Patient takes 1 in the morning , 1 in the afternoon, and 1 at night.  . ondansetron (ZOFRAN) 4 MG tablet TAKE 1 TABLET TWICE DAILY AS NEEDED FOR NAUSEA AND VOMITING.  . OVER THE COUNTER MEDICATION Vitamin K -2 Patient states that she takes daily with her Vitamin D.  . oxyCODONE (OXY IR/ROXICODONE) 5 MG immediate release tablet Take 1 tablet (5 mg total) by mouth 2 (two) times daily.  Marland Kitchen oxyCODONE (ROXICODONE) 5 MG immediate release tablet Take 1 tablet (5 mg total) by mouth every 4 (four) hours as needed for severe pain.  . Probiotic Product (PROBIOTIC FORMULA PO) Take 1-2 capsules by mouth every morning.   . vitamin A 10000 UNIT capsule Take 10,000 Units by mouth once a week.   . vitamin C (ASCORBIC ACID) 500 MG tablet Take 500 mg by mouth once a week.   . vitamin E 400 UNIT capsule Take 400 Units by mouth every morning.   . zolpidem (AMBIEN) 5 MG tablet Take 1 tablet (5 mg total) by mouth at bedtime as needed.  . [DISCONTINUED] oxyCODONE (ROXICODONE) 5 MG immediate release tablet Take  1 tablet (5 mg total) by mouth every 4 (four) hours as needed for severe pain.   No facility-administered encounter medications on file as of 04/05/2017.      Objective: Blood pressure 128/90, pulse 69, temperature (!) 97.5 F (36.4 C), temperature source Oral, resp. rate 18, height 5' 8"  (1.727 m), weight 167 lb 3.2 oz (75.8 kg). Patient is alert and in no acute distress. Conjunctiva is pink. Sclera is nonicteric Oropharyngeal mucosa is normal. No neck masses or thyromegaly noted. Cardiac exam with regular rhythm normal S1 and S2. No murmur or gallop noted. Lungs are  clear to auscultation. Abdomen is symmetrical.  Bowel sounds are normal.  On palpation abdomen is soft and nontender without organomegaly or masses. No LE edema or clubbing noted.  Labs/studies Results: CRP was 8.5 on 08/24/2016 (upper limit of normal 8).   Assessment:  #1.  Chronic ulcerative colitis.  She is presently not on any medications for maintenance and she appears to be in remission.  It is quite possible that her disease has burned itself out.  Would like to document that her stool is guaiac-negative and CRP is back to normal.  #2.  History of IBS.  Low-dose dicyclomine has helped.  She is not having any side effects.  Therefore prescription will be refilled.  #3.  Intermittent nausea without vomiting most likely due to medications.  #4.  Hemorrhoids with intermittent discomfort.   Plan:  Dicyclomine 10 mg p.o. twice daily. Ondansetron 4 mg p.o. twice daily as needed 20 with 1 refill. ProctoCream 2.5% to be used on as-needed basis. Patient will go to the lab for CBC CRP and hemoglobin A1c. Office visit in 6 months.

## 2017-04-06 LAB — CBC
HCT: 39.2 % (ref 35.0–45.0)
Hemoglobin: 13.2 g/dL (ref 11.7–15.5)
MCH: 29.1 pg (ref 27.0–33.0)
MCHC: 33.7 g/dL (ref 32.0–36.0)
MCV: 86.5 fL (ref 80.0–100.0)
MPV: 11.1 fL (ref 7.5–12.5)
PLATELETS: 331 10*3/uL (ref 140–400)
RBC: 4.53 10*6/uL (ref 3.80–5.10)
RDW: 13.1 % (ref 11.0–15.0)
WBC: 6.7 10*3/uL (ref 3.8–10.8)

## 2017-04-06 LAB — HEMOGLOBIN A1C
HEMOGLOBIN A1C: 7.3 %{Hb} — AB (ref <5.7)
Mean Plasma Glucose: 163 (calc)
eAG (mmol/L): 9 (calc)

## 2017-04-06 LAB — C-REACTIVE PROTEIN: CRP: 2.9 mg/L (ref <8.0)

## 2017-04-14 ENCOUNTER — Telehealth (INDEPENDENT_AMBULATORY_CARE_PROVIDER_SITE_OTHER): Payer: Self-pay | Admitting: *Deleted

## 2017-04-14 NOTE — Telephone Encounter (Signed)
   Diagnosis:    Result(s)   Card 1: Positive - Brown stool          Completed by: Thomas Hoff , LPN   HEMOCCULT SENSA DEVELOPER: LOT#: [id number] EXPIRATION DATE: 2020-10   HEMOCCULT SENSA CARD:  LOT#: [id number] EXPIRATION DATE: 03/20   CARD CONTROL RESULTS:  POSITIVE: Positive NEGATIVE: Negative    ADDITIONAL COMMENTS: Patient has not been notified of results. CRP on 04/05/17 was 2.9. Forwarded to [place] for review.

## 2017-04-15 ENCOUNTER — Emergency Department (HOSPITAL_COMMUNITY)
Admission: EM | Admit: 2017-04-15 | Discharge: 2017-04-16 | Disposition: A | Discharge status: Home / Self Care | Payer: Medicare Other | Attending: Emergency Medicine | Admitting: Emergency Medicine

## 2017-04-15 ENCOUNTER — Encounter (HOSPITAL_COMMUNITY): Payer: Self-pay | Admitting: Emergency Medicine

## 2017-04-15 ENCOUNTER — Other Ambulatory Visit: Payer: Self-pay

## 2017-04-15 DIAGNOSIS — I129 Hypertensive chronic kidney disease with stage 1 through stage 4 chronic kidney disease, or unspecified chronic kidney disease: Secondary | ICD-10-CM | POA: Insufficient documentation

## 2017-04-15 DIAGNOSIS — Z79899 Other long term (current) drug therapy: Secondary | ICD-10-CM | POA: Diagnosis not present

## 2017-04-15 DIAGNOSIS — R531 Weakness: Secondary | ICD-10-CM

## 2017-04-15 DIAGNOSIS — R519 Headache, unspecified: Secondary | ICD-10-CM

## 2017-04-15 DIAGNOSIS — R55 Syncope and collapse: Secondary | ICD-10-CM | POA: Diagnosis not present

## 2017-04-15 DIAGNOSIS — R404 Transient alteration of awareness: Secondary | ICD-10-CM | POA: Diagnosis not present

## 2017-04-15 DIAGNOSIS — Z91040 Latex allergy status: Secondary | ICD-10-CM | POA: Insufficient documentation

## 2017-04-15 DIAGNOSIS — N183 Chronic kidney disease, stage 3 (moderate): Secondary | ICD-10-CM | POA: Diagnosis not present

## 2017-04-15 DIAGNOSIS — R51 Headache: Secondary | ICD-10-CM | POA: Diagnosis not present

## 2017-04-15 LAB — CBG MONITORING, ED: Glucose-Capillary: 141 mg/dL — ABNORMAL HIGH (ref 65–99)

## 2017-04-15 NOTE — ED Triage Notes (Signed)
Per ems pt went out dancing tonight and since then has had generalized weakness and feels like she is going to pass out.

## 2017-04-15 NOTE — ED Provider Notes (Signed)
[facility] EMERGENCY DEPARTMENT Provider Note   CSN: [id number] Arrival date & time: 04/15/17  2318     History   Chief Complaint Chief Complaint  Patient presents with  . Weakness    HPI Jerusha Reising is a 77 y.o. female.  Patient is a 77 year old female with past medical history of anxiety, bipolar, fibromyalgia, hypertension presenting for evaluation of headache and weakness.  She reports that she was dancing this evening at a get together when she developed a feeling of weakness and frontal headache.  She denies any focal weakness, numbness, or tingling.  She describes this as a generalized weakness.  She denies any visual disturbances.  She denies any chest pain, difficulty breathing.  She also denies any recent symptoms such as fevers, URI symptoms, or GI issues.   The history is provided by the patient.  Weakness  Primary symptoms comment: Generalized weakness. This is a new problem. The current episode started 1 to 2 hours ago. The problem has been gradually improving. There was no focality noted. There has been no fever. Pertinent negatives include no shortness of breath and no chest pain.    Past Medical History:  Diagnosis Date  . Anemia due to blood loss 05/02/2011  . Anxiety   . Bipolar disorder ([place])   . Collagen vascular disease ([place])    ra  . Fibromyalgia   . Hyperglycemia, drug-induced 05/02/2011  . Hypertension   . Ulcerative colitis     Patient Active Problem List   Diagnosis Date Noted  . CKD (chronic kidney disease) stage 3, GFR 30-59 ml/min (HCC) 11/07/2015  . Hyperlipidemia with target LDL less than 100 01/24/2015  . Insomnia 01/24/2015  . Metabolic syndrome 73/41/9379  . Bradycardia 05/03/2011  . Fibromyalgia 11/04/2010  . Depression 11/04/2010  . Essential hypertension 03/08/2007  . ULCERATIVE COLITIS, LEFT SIDED 03/08/2007    Past Surgical History:  Procedure Laterality Date  . ABDOMINAL HYSTERECTOMY    . BIOPSY N/A 12/28/2014   Procedure:  SIGMOID AND RECTAL BIOPSIES;  Surgeon: Rogene Houston, MD;  Location: [facility];  Service: Endoscopy;  Laterality: N/A;  . CHOLECYSTECTOMY     s/p  . COLONOSCOPY  06/16/06. 04/27/2007   proctitis seen, biopsies showed chronic active colitis, no dysplasia   . FLEXIBLE SIGMOIDOSCOPY N/A 12/28/2014   Procedure: FLEXIBLE SIGMOIDOSCOPY WITH PROPOFOL;  Surgeon: Rogene Houston, MD;  Location: [facility];  Service: Endoscopy;  Laterality: N/A;    OB History    No data available       Home Medications    Prior to Admission medications   Medication Sig Start Date End Date Taking? Authorizing Provider  ALPRAZolam Duanne Moron) 0.5 MG tablet Take 1 Tablet by mouth 2 times a day as needed FOR ANXIETY 02/09/17   Hassell Done, Mary-Margaret, FNP  antiseptic oral rinse (BIOTENE) LIQD 1 application by Mouth Rinse route daily. FOR DRY MOUTH    [provider]  B Complex CAPS Take 1 capsule by mouth every morning.     [provider]  benazepril-hydrochlorthiazide (LOTENSIN HCT) 20-12.5 MG tablet Take 1 tablet by mouth daily. 11/10/16   Hassell Done Mary-Margaret, FNP  Cholecalciferol (VITAMIN D3) 2000 units TABS Take 1 capsule by mouth daily. Patient states that she alternates with Hair, Skin, and Nails.    [provider]  co-enzyme Q-10 50 MG capsule Take 50 mg by mouth once a week.     [provider]  dicyclomine (BENTYL) 20 MG tablet TAKE 1/2 TABLET TWICE DAILY  BEFORE A MEAL. 04/05/17   Rehman, Mechele Dawley, MD  fenofibrate (TRICOR) 145 MG tablet Take 1 tablet (145 mg total) by mouth daily. 11/10/16   Hassell Done, Mary-Margaret, FNP  hydrocortisone (ANUSOL-HC) 2.5 % rectal cream Place 1 application rectally 2 (two) times daily. 04/05/17   Rogene Houston, MD  Multiple Vitamin (THERA) TABS Take by mouth.    [provider]  Multiple Vitamins-Minerals (WOMENS MULTI VITAMIN & MINERAL PO) Take 1 tablet by mouth every other day.     [provider]  Omega-3 Fatty Acids (FISH OIL) 1000  MG CPDR Take 3 capsules by mouth. Patient takes 1 in the morning , 1 in the afternoon, and 1 at night.    [provider]  ondansetron (ZOFRAN) 4 MG tablet TAKE 1 TABLET TWICE DAILY AS NEEDED FOR NAUSEA AND VOMITING. 04/05/17   Rehman, Mechele Dawley, MD  OVER THE COUNTER MEDICATION Vitamin K -2 Patient states that she takes daily with her Vitamin D.    [provider]  oxyCODONE (OXY IR/ROXICODONE) 5 MG immediate release tablet Take 1 tablet (5 mg total) by mouth 2 (two) times daily. 02/09/17   Hassell Done Mary-Margaret, FNP  Probiotic Product (PROBIOTIC FORMULA PO) Take 1-2 capsules by mouth every morning.     [provider]  vitamin A 10000 UNIT capsule Take 10,000 Units by mouth once a week.     [provider]  vitamin C (ASCORBIC ACID) 500 MG tablet Take 500 mg by mouth once a week.     [provider]  vitamin E 400 UNIT capsule Take 400 Units by mouth every morning.     [provider]  zolpidem (AMBIEN) 5 MG tablet Take 1 tablet (5 mg total) by mouth at bedtime as needed. 02/09/17   Hassell Done, Mary-Margaret, FNP  sodium chloride (OCEAN) 0.65 % SOLN nasal spray Place 2 sprays into the nose as needed for congestion. 05/05/11 06/07/11  Delfina Redwood, MD    Family History Family History  Problem Relation Age of Onset  . Heart attack Mother 59       Died in her sleep  . Diabetes Mother   . Heart attack Father 50       Died in her sleep  . Diabetes Father   . Heart attack Brother 42       Defib, CABG  . Diabetes Brother   . Cancer Sister     Social History Social History   Tobacco Use  . Smoking status: Never Smoker  . Smokeless tobacco: Never Used  Substance Use Topics  . Alcohol use: Yes    Alcohol/week: 0.0 oz    Comment: Ocassionally a small glass of wine. Patient states that is has been 2 months since she drank anything.  . Drug use: No     Allergies   Azathioprine; Tape; Codeine; Lac bovis; Penicillins; Povidone-iodine;  Procaine hcl; Sulfonamide derivatives; Tramadol; Acyclovir and related; Azithromycin; Clonazepam; Dairy aid [lactase]; Doxycycline; Latex; and Niacin and related   Review of Systems Review of Systems  Respiratory: Negative for shortness of breath.   Cardiovascular: Negative for chest pain.  Neurological: Positive for weakness.  All other systems reviewed and are negative.    Physical Exam Updated Vital Signs Ht 5' 8"  (1.727 m)   Wt 75.8 kg (167 lb)   BMI 25.39 kg/m   Physical Exam  Constitutional: She is oriented to person, place, and time. She appears well-developed and well-nourished. No distress.  HENT:  Head: Normocephalic  and atraumatic.  Mouth/Throat: Oropharynx is clear and moist.  Eyes: EOM are normal. Pupils are equal, round, and reactive to light.  Neck: Normal range of motion. Neck supple.  Cardiovascular: Normal rate and regular rhythm. Exam reveals no gallop and no friction rub.  No murmur heard. Pulmonary/Chest: Effort normal and breath sounds normal. No respiratory distress. She has no wheezes.  Abdominal: Soft. Bowel sounds are normal. She exhibits no distension. There is no tenderness.  Musculoskeletal: Normal range of motion.  Neurological: She is alert and oriented to person, place, and time. No cranial nerve deficit. She exhibits normal muscle tone. Coordination normal.  Skin: Skin is warm and dry. She is not diaphoretic.  Nursing note and vitals reviewed.    ED Treatments / Results  Labs (all labs ordered are listed, but only abnormal results are displayed) Labs Reviewed  BASIC METABOLIC PANEL  CBC  URINALYSIS, ROUTINE W REFLEX MICROSCOPIC  CBG MONITORING, ED    EKG  EKG Interpretation  Date/Time:  Friday April 16 2017 00:31:06 EST Ventricular Rate:  90 PR Interval:    QRS Duration: 84 QT Interval:  366 QTC Calculation: 448 R Axis:   87 Text Interpretation:  Sinus rhythm Borderline right axis deviation Low voltage, precordial leads  Borderline T abnormalities, anterior leads Confirmed by Veryl Speak [id number]) on 04/16/2017 3:01:48 AM       Radiology No results found.  Procedures Procedures (including critical care time)  Medications Ordered in ED Medications - No data to display   Initial Impression / Assessment and Plan / ED Course  I have reviewed the triage vital signs and the nursing notes.  Pertinent labs & imaging results that were available during my care of the patient were reviewed by me and considered in my medical decision making (see chart for details).  Patient presents here with complaints of weakness and headache that started abruptly while she was dancing.  Her neurologic exam is nonfocal and she is feeling better after receiving Tylenol.  Her laboratory studies are unremarkable and EKG is changed.  I see no indication for admission or further workup.  She will be discharged, to follow-up as needed for any problems.  Final Clinical Impressions(s) / ED Diagnoses   Final diagnoses:  None    ED Discharge Orders    None       Veryl Speak, MD 04/16/17 [id number]

## 2017-04-16 ENCOUNTER — Emergency Department (HOSPITAL_COMMUNITY): Payer: Medicare Other

## 2017-04-16 DIAGNOSIS — R51 Headache: Secondary | ICD-10-CM | POA: Diagnosis not present

## 2017-04-16 LAB — BASIC METABOLIC PANEL
ANION GAP: 11 (ref 5–15)
BUN: 22 mg/dL — ABNORMAL HIGH (ref 6–20)
CALCIUM: 9.2 mg/dL (ref 8.9–10.3)
CHLORIDE: 101 mmol/L (ref 101–111)
CO2: 25 mmol/L (ref 22–32)
Creatinine, Ser: 1.37 mg/dL — ABNORMAL HIGH (ref 0.44–1.00)
GFR calc Af Amer: 43 mL/min — ABNORMAL LOW (ref >60)
GFR calc non Af Amer: 37 mL/min — ABNORMAL LOW (ref >60)
GLUCOSE: 141 mg/dL — AB (ref 65–99)
Potassium: 3.9 mmol/L (ref 3.5–5.1)
Sodium: 137 mmol/L (ref 135–145)

## 2017-04-16 LAB — CBC
HCT: 40.5 % (ref 36.0–46.0)
HEMOGLOBIN: 13.1 g/dL (ref 12.0–15.0)
MCH: 29.4 pg (ref 26.0–34.0)
MCHC: 32.3 g/dL (ref 30.0–36.0)
MCV: 90.8 fL (ref 78.0–100.0)
Platelets: 283 10*3/uL (ref 150–400)
RBC: 4.46 MIL/uL (ref 3.87–5.11)
RDW: 13.8 % (ref 11.5–15.5)
WBC: 8.3 10*3/uL (ref 4.0–10.5)

## 2017-04-16 LAB — URINALYSIS, ROUTINE W REFLEX MICROSCOPIC
BACTERIA UA: NONE SEEN
Bilirubin Urine: NEGATIVE
GLUCOSE, UA: NEGATIVE mg/dL
Hgb urine dipstick: NEGATIVE
KETONES UR: NEGATIVE mg/dL
Nitrite: NEGATIVE
PROTEIN: NEGATIVE mg/dL
Specific Gravity, Urine: 1.016 (ref 1.005–1.030)
pH: 6 (ref 5.0–8.0)

## 2017-04-16 MED ORDER — ACETAMINOPHEN 500 MG PO TABS
1000.0000 mg | ORAL_TABLET | Freq: Once | ORAL | Status: AC
  Administered 2017-04-16: 1000 mg via ORAL
  Filled 2017-04-16: qty 2

## 2017-04-16 NOTE — Telephone Encounter (Signed)
Results reviewed with patient. . Since she is not having diarrhea or rectal bleeding will monitor over the next few weeks before considering newer agents for UC. Patient advised to call with progress report in 6-8 weeks.

## 2017-04-16 NOTE — Discharge Instructions (Signed)
Drink plenty of fluids and get plenty of rest.  Return to the emergency department if symptoms significantly worsen or change.

## 2017-05-06 DIAGNOSIS — L821 Other seborrheic keratosis: Secondary | ICD-10-CM | POA: Diagnosis not present

## 2017-05-06 DIAGNOSIS — D225 Melanocytic nevi of trunk: Secondary | ICD-10-CM | POA: Diagnosis not present

## 2017-05-06 DIAGNOSIS — L570 Actinic keratosis: Secondary | ICD-10-CM | POA: Diagnosis not present

## 2017-05-06 DIAGNOSIS — X32XXXD Exposure to sunlight, subsequent encounter: Secondary | ICD-10-CM | POA: Diagnosis not present

## 2017-05-11 ENCOUNTER — Ambulatory Visit: Payer: Medicare Other | Admitting: Nurse Practitioner

## 2017-05-11 ENCOUNTER — Encounter: Payer: Self-pay | Admitting: Nurse Practitioner

## 2017-05-11 VITALS — BP 135/83 | HR 79 | Temp 96.8°F | Ht 68.0 in | Wt 164.0 lb

## 2017-05-11 DIAGNOSIS — E8881 Metabolic syndrome: Secondary | ICD-10-CM

## 2017-05-11 DIAGNOSIS — M797 Fibromyalgia: Secondary | ICD-10-CM | POA: Diagnosis not present

## 2017-05-11 DIAGNOSIS — F5101 Primary insomnia: Secondary | ICD-10-CM

## 2017-05-11 DIAGNOSIS — K5150 Left sided colitis without complications: Secondary | ICD-10-CM | POA: Diagnosis not present

## 2017-05-11 DIAGNOSIS — I10 Essential (primary) hypertension: Secondary | ICD-10-CM | POA: Diagnosis not present

## 2017-05-11 DIAGNOSIS — N183 Chronic kidney disease, stage 3 unspecified: Secondary | ICD-10-CM

## 2017-05-11 DIAGNOSIS — E785 Hyperlipidemia, unspecified: Secondary | ICD-10-CM | POA: Diagnosis not present

## 2017-05-11 DIAGNOSIS — F3342 Major depressive disorder, recurrent, in full remission: Secondary | ICD-10-CM

## 2017-05-11 MED ORDER — ZOLPIDEM TARTRATE 5 MG PO TABS: 5.0000 mg | ORAL_TABLET | Freq: Every evening | ORAL | 2 refills | Status: DC | PRN

## 2017-05-11 MED ORDER — OXYCODONE HCL 5 MG PO TABS: 5.0000 mg | ORAL_TABLET | Freq: Two times a day (BID) | ORAL | 0 refills | Status: DC

## 2017-05-11 MED ORDER — ALPRAZOLAM 0.5 MG PO TABS
ORAL_TABLET | ORAL | 2 refills | Status: DC
Start: 2017-05-11 — End: 2017-08-11

## 2017-05-11 MED ORDER — FENOFIBRATE 145 MG PO TABS: 145.0000 mg | ORAL_TABLET | Freq: Every day | ORAL | 5 refills | Status: DC

## 2017-05-11 NOTE — Progress Notes (Signed)
Subjective:    Patient ID: [name], female    DOB: 11/01/1940, 77 y.o.   MRN: [id number]  HPI   Kaysie Michelini is here today for follow up of chronic medical problem.  Outpatient Encounter Medications as of 05/11/2017  Medication Sig  . ALPRAZolam (XANAX) 0.5 MG tablet Take 1 Tablet by mouth 2 times a day as needed FOR ANXIETY  . antiseptic oral rinse (BIOTENE) LIQD 1 application by Mouth Rinse route daily. FOR DRY MOUTH  . B Complex CAPS Take 1 capsule by mouth every morning.   . benazepril-hydrochlorthiazide (LOTENSIN HCT) 20-12.5 MG tablet Take 1 tablet by mouth daily.  . Cholecalciferol (VITAMIN D3) 2000 units TABS Take 1 capsule by mouth daily. Patient states that she alternates with Hair, Skin, and Nails.  Marland Kitchen co-enzyme Q-10 50 MG capsule Take 50 mg by mouth once a week.   . dicyclomine (BENTYL) 20 MG tablet TAKE 1/2 TABLET TWICE DAILY BEFORE A MEAL.  . fenofibrate (TRICOR) 145 MG tablet Take 1 tablet (145 mg total) by mouth daily.  . hydrocortisone (ANUSOL-HC) 2.5 % rectal cream Place 1 application rectally 2 (two) times daily.  . Multiple Vitamin (THERA) TABS Take by mouth.  . Multiple Vitamins-Minerals (WOMENS MULTI VITAMIN & MINERAL PO) Take 1 tablet by mouth every other day.   . Omega-3 Fatty Acids (FISH OIL) 1000 MG CPDR Take 3 capsules by mouth. Patient takes 1 in the morning , 1 in the afternoon, and 1 at night.  . ondansetron (ZOFRAN) 4 MG tablet TAKE 1 TABLET TWICE DAILY AS NEEDED FOR NAUSEA AND VOMITING.  . OVER THE COUNTER MEDICATION Vitamin K -2 Patient states that she takes daily with her Vitamin D.  . oxyCODONE (OXY IR/ROXICODONE) 5 MG immediate release tablet Take 1 tablet (5 mg total) by mouth 2 (two) times daily.  . Probiotic Product (PROBIOTIC FORMULA PO) Take 1-2 capsules by mouth every morning.   . vitamin A 10000 UNIT capsule Take 10,000 Units by mouth once a week.   . vitamin C (ASCORBIC ACID) 500 MG tablet Take 500 mg by mouth once a week.   . vitamin E 400  UNIT capsule Take 400 Units by mouth every morning.   . zolpidem (AMBIEN) 5 MG tablet Take 1 tablet (5 mg total) by mouth at bedtime as needed.     1. Essential hypertension  no c/o chest pain, sob or headache. Does not check blood pressures at home. BP Readings from Last 3 Encounters:  05/11/17 135/83  04/16/17 (!) 147/66  04/05/17 128/90     2. ULCERATIVE COLITIS, LEFT SIDED  Has been doing okay. She takes pain meds for the pain  3. CKD (chronic kidney disease) stage 3, GFR 30-59 ml/min (HCC ) we are currently just watching labs  4. Recurrent major depressive disorder, in full remission ([place])  refuses antidepressant. Has been on xanax for many years. Is upset today because eher son died yesterday  47. Fibromyalgia  Hurts all the time- takes roxicodone which hellps  6. Hyperlipidemia with target LDL less than 100  Does not watch diet at all  7. Primary insomnia  Takes ambien nightly to sleep which really helps  8. Metabolic syndrome  Does not watch diet very closely.    New complaints: None today  Social history: Son died yesterday   Review of Systems  Constitutional: Negative for activity change and appetite change.  HENT: Negative.   Eyes: Negative for pain.  Respiratory: Negative for shortness of breath.  Cardiovascular: Negative for chest pain, palpitations and leg swelling.  Gastrointestinal: Negative for abdominal pain.  Endocrine: Negative for polydipsia.  Genitourinary: Negative.   Skin: Negative for rash.  Neurological: Negative for dizziness, weakness and headaches.  Hematological: Does not bruise/bleed easily.  Psychiatric/Behavioral: Negative.   All other systems reviewed and are negative.      Objective:   Physical Exam  Constitutional: She is oriented to person, place, and time. She appears well-developed and well-nourished.  HENT:  Nose: Nose normal.  Mouth/Throat: Oropharynx is clear and moist.  Eyes: EOM are normal.  Neck: Trachea normal,  normal range of motion and full passive range of motion without pain. Neck supple. No JVD present. Carotid bruit is not present. No thyromegaly present.  Cardiovascular: Normal rate, regular rhythm, normal heart sounds and intact distal pulses. Exam reveals no gallop and no friction rub.  No murmur heard. Pulmonary/Chest: Effort normal and breath sounds normal.  Abdominal: Soft. Bowel sounds are normal. She exhibits no distension and no mass. There is tenderness (diffuse).  Musculoskeletal: Normal range of motion.  Lymphadenopathy:    She has no cervical adenopathy.  Neurological: She is alert and oriented to person, place, and time. She has normal reflexes.  Skin: Skin is warm and dry.  Psychiatric: She has a normal mood and affect. Her behavior is normal. Judgment and thought content normal.   BP 135/83   Pulse 79   Temp (!) 96.8 F (36 C) (Oral)   Ht _0  (1.727 m)   Wt 164 lb (74.4 kg)   BMI 24.94 kg/m         Assessment & Plan:  1. Essential hypertension Low sodium diet  2. ULCERATIVE COLITIS, LEFT SIDED Watch diet Keep folloiw up with dr. [name] - oxyCODONE (OXY IR/ROXICODONE) 5 MG immediate release tablet; Take 1 tablet (5 mg total) by mouth 2 (two) times daily.  Dispense: 60 tablet; Refill: 0 - oxyCODONE (OXY IR/ROXICODONE) 5 MG immediate release tablet; Take 1 tablet (5 mg total) by mouth 2 (two) times daily.  Dispense: 60 tablet; Refill: 0 - oxyCODONE (ROXICODONE) 5 MG immediate release tablet; Take 1 tablet (5 mg total) by mouth 2 (two) times daily.  Dispense: 60 tablet; Refill: 0  3. CKD (chronic kidney disease) stage 3, GFR 30-59 ml/min (HCC) Labs pending  4. Recurrent major depressive disorder, in full remission ([place]) Stress management - ALPRAZolam (XANAX) 0.5 MG tablet; Take 1 Tablet by mouth 2 times a day as needed FOR ANXIETY  Dispense: 60 tablet; Refill: 2  5. Fibromyalgia Exercise to keep muscles warm and to help with pain  6. Hyperlipidemia with  target LDL less than 100 Low fat diet - fenofibrate (TRICOR) 145 MG tablet; Take 1 tablet (145 mg total) by mouth daily.  Dispense: 30 tablet; Refill: 5  7. Primary insomnia Bedtime routine - zolpidem (AMBIEN) 5 MG tablet; Take 1 tablet (5 mg total) by mouth at bedtime as needed.  Dispense: 30 tablet; Refill: 2  8. Metabolic syndrome Watch carbs in diet  Orders Placed This Encounter  Procedures  . CMP14+EGFR  . Lipid panel     Labs pending Health maintenance reviewed Diet and exercise encouraged Continue all meds Follow up  In 3 months   [place], FNP

## 2017-05-11 NOTE — Patient Instructions (Signed)
Stress and Stress Management Stress is a normal reaction to life events. It is what you feel when life demands more than you are used to or more than you can handle. Some stress can be useful. For example, the stress reaction can help you catch the last bus of the day, study for a test, or meet a deadline at work. But stress that occurs too often or for too long can cause problems. It can affect your emotional health and interfere with relationships and normal daily activities. Too much stress can weaken your immune system and increase your risk for physical illness. If you already have a medical problem, stress can make it worse. What are the causes? All sorts of life events may cause stress. An event that causes stress for one person may not be stressful for another person. Major life events commonly cause stress. These may be positive or negative. Examples include losing your job, moving into a new home, getting married, having a baby, or losing a loved one. Less obvious life events may also cause stress, especially if they occur day after day or in combination. Examples include working long hours, driving in traffic, caring for children, being in debt, or being in a difficult relationship. What are the signs or symptoms? Stress may cause emotional symptoms including, the following:  Anxiety. This is feeling worried, afraid, on edge, overwhelmed, or out of control.  Anger. This is feeling irritated or impatient.  Depression. This is feeling sad, down, helpless, or guilty.  Difficulty focusing, remembering, or making decisions.  Stress may cause physical symptoms, including the following:  Aches and pains. These may affect your head, neck, back, stomach, or other areas of your body.  Tight muscles or clenched jaw.  Low energy or trouble sleeping.  Stress may cause unhealthy behaviors, including the following:  Eating to feel better (overeating) or skipping meals.  Sleeping too little,  too much, or both.  Working too much or putting off tasks (procrastination).  Smoking, drinking alcohol, or using drugs to feel better.  How is this diagnosed? Stress is diagnosed through an assessment by your health care provider. Your health care provider will ask questions about your symptoms and any stressful life events.Your health care provider will also ask about your medical history and may order blood tests or other tests. Certain medical conditions and medicine can cause physical symptoms similar to stress. Mental illness can cause emotional symptoms and unhealthy behaviors similar to stress. Your health care provider may refer you to a mental health professional for further evaluation. How is this treated? Stress management is the recommended treatment for stress.The goals of stress management are reducing stressful life events and coping with stress in healthy ways. Techniques for reducing stressful life events include the following:  Stress identification. Self-monitor for stress and identify what causes stress for you. These skills may help you to avoid some stressful events.  Time management. Set your priorities, keep a calendar of events, and learn to say "no." These tools can help you avoid making too many commitments.  Techniques for coping with stress include the following:  Rethinking the problem. Try to think realistically about stressful events rather than ignoring them or overreacting. Try to find the positives in a stressful situation rather than focusing on the negatives.  Exercise. Physical exercise can release both physical and emotional tension. The key is to find a form of exercise you enjoy and do it regularly.  Relaxation techniques. These relax the body and  mind. Examples include yoga, meditation, tai chi, biofeedback, deep breathing, progressive muscle relaxation, listening to music, being out in nature, journaling, and other hobbies. Again, the key is to find  one or more that you enjoy and can do regularly.  Healthy lifestyle. Eat a balanced diet, get plenty of sleep, and do not smoke. Avoid using alcohol or drugs to relax.  Strong support network. Spend time with family, friends, or other people you enjoy being around.Express your feelings and talk things over with someone you trust.  Counseling or talktherapy with a mental health professional may be helpful if you are having difficulty managing stress on your own. Medicine is typically not recommended for the treatment of stress.Talk to your health care provider if you think you need medicine for symptoms of stress. Follow these instructions at home:  Keep all follow-up visits as directed by your health care provider.  Take all medicines as directed by your health care provider. Contact a health care provider if:  Your symptoms get worse or you start having new symptoms.  You feel overwhelmed by your problems and can no longer manage them on your own. Get help right away if:  You feel like hurting yourself or someone else. This information is not intended to replace advice given to you by your health care provider. Make sure you discuss any questions you have with your health care provider. Document Released: 09/02/2000 Document Revised: 08/15/2015 Document Reviewed: 11/01/2012 Elsevier Interactive Patient Education  2017 [employer]  

## 2017-05-12 LAB — CMP14+EGFR
A/G RATIO: 1.5 (ref 1.2–2.2)
ALT: 17 IU/L (ref 0–32)
AST: 21 IU/L (ref 0–40)
Albumin: 4 g/dL (ref 3.5–4.8)
Alkaline Phosphatase: 83 IU/L (ref 39–117)
BUN / CREAT RATIO: 13 (ref 12–28)
BUN: 19 mg/dL (ref 8–27)
Bilirubin Total: 0.4 mg/dL (ref 0.0–1.2)
CALCIUM: 9.4 mg/dL (ref 8.7–10.3)
CO2: 23 mmol/L (ref 20–29)
Chloride: 101 mmol/L (ref 96–106)
Creatinine, Ser: 1.49 mg/dL — ABNORMAL HIGH (ref 0.57–1.00)
GFR, EST AFRICAN AMERICAN: 39 mL/min/{1.73_m2} — AB (ref >59)
GFR, EST NON AFRICAN AMERICAN: 34 mL/min/{1.73_m2} — AB (ref >59)
GLOBULIN, TOTAL: 2.7 g/dL (ref 1.5–4.5)
Glucose: 171 mg/dL — ABNORMAL HIGH (ref 65–99)
POTASSIUM: 4.3 mmol/L (ref 3.5–5.2)
SODIUM: 141 mmol/L (ref 134–144)
TOTAL PROTEIN: 6.7 g/dL (ref 6.0–8.5)

## 2017-05-12 LAB — LIPID PANEL
CHOL/HDL RATIO: 5.5 ratio — AB (ref 0.0–4.4)
Cholesterol, Total: 193 mg/dL (ref 100–199)
HDL: 35 mg/dL — ABNORMAL LOW (ref >39)
LDL Calculated: 105 mg/dL — ABNORMAL HIGH (ref 0–99)
Triglycerides: 267 mg/dL — ABNORMAL HIGH (ref 0–149)
VLDL Cholesterol Cal: 53 mg/dL — ABNORMAL HIGH (ref 5–40)

## 2017-05-13 ENCOUNTER — Ambulatory Visit: Payer: Medicare Other | Admitting: Nurse Practitioner

## 2017-05-17 ENCOUNTER — Telehealth (INDEPENDENT_AMBULATORY_CARE_PROVIDER_SITE_OTHER): Payer: Self-pay | Admitting: *Deleted

## 2017-05-17 NOTE — Telephone Encounter (Signed)
Patient called left message stating [employer] has denied her dicyclomine and the pharmacy is sending a new medication request. Patient states she cannot have her dicyclomine refilled. [phone]

## 2017-05-17 NOTE — Telephone Encounter (Signed)
I will send this to [facility]

## 2017-05-17 NOTE — Telephone Encounter (Signed)
We also sent this to [place] and it  Came back will need to do an apply letter.  This is on [name]'s desk for her to work on this

## 2017-05-20 NOTE — Telephone Encounter (Signed)
We are trying to work on this for the patient. She will be called when we know something.

## 2017-05-31 NOTE — Telephone Encounter (Signed)
Patient was called and made aware that we are working on this.

## 2017-06-14 DIAGNOSIS — Z1231 Encounter for screening mammogram for malignant neoplasm of breast: Secondary | ICD-10-CM | POA: Diagnosis not present

## 2017-06-14 LAB — HM MAMMOGRAPHY

## 2017-06-21 ENCOUNTER — Other Ambulatory Visit: Payer: Self-pay | Admitting: Nurse Practitioner

## 2017-06-21 DIAGNOSIS — I10 Essential (primary) hypertension: Secondary | ICD-10-CM

## 2017-06-28 DIAGNOSIS — R928 Other abnormal and inconclusive findings on diagnostic imaging of breast: Secondary | ICD-10-CM | POA: Diagnosis not present

## 2017-06-28 DIAGNOSIS — R922 Inconclusive mammogram: Secondary | ICD-10-CM | POA: Diagnosis not present

## 2017-06-28 DIAGNOSIS — Z803 Family history of malignant neoplasm of breast: Secondary | ICD-10-CM | POA: Diagnosis not present

## 2017-07-15 ENCOUNTER — Other Ambulatory Visit: Payer: Self-pay | Admitting: *Deleted

## 2017-07-15 DIAGNOSIS — I10 Essential (primary) hypertension: Secondary | ICD-10-CM

## 2017-07-15 MED ORDER — BENAZEPRIL-HYDROCHLOROTHIAZIDE 20-12.5 MG PO TABS: ORAL_TABLET | ORAL | 0 refills | Status: DC

## 2017-07-28 ENCOUNTER — Emergency Department (HOSPITAL_COMMUNITY): Payer: Medicare Other

## 2017-07-28 ENCOUNTER — Emergency Department (HOSPITAL_COMMUNITY)
Admission: EM | Admit: 2017-07-28 | Discharge: 2017-07-28 | Disposition: A | Discharge status: Home / Self Care | Payer: Medicare Other | Attending: Emergency Medicine | Admitting: Emergency Medicine

## 2017-07-28 ENCOUNTER — Encounter (HOSPITAL_COMMUNITY): Payer: Self-pay | Admitting: Adult Health

## 2017-07-28 ENCOUNTER — Other Ambulatory Visit: Payer: Self-pay

## 2017-07-28 DIAGNOSIS — R079 Chest pain, unspecified: Secondary | ICD-10-CM | POA: Diagnosis not present

## 2017-07-28 DIAGNOSIS — I129 Hypertensive chronic kidney disease with stage 1 through stage 4 chronic kidney disease, or unspecified chronic kidney disease: Secondary | ICD-10-CM | POA: Insufficient documentation

## 2017-07-28 DIAGNOSIS — Z91040 Latex allergy status: Secondary | ICD-10-CM | POA: Insufficient documentation

## 2017-07-28 DIAGNOSIS — R109 Unspecified abdominal pain: Secondary | ICD-10-CM | POA: Diagnosis not present

## 2017-07-28 DIAGNOSIS — R110 Nausea: Secondary | ICD-10-CM | POA: Diagnosis not present

## 2017-07-28 DIAGNOSIS — Z79899 Other long term (current) drug therapy: Secondary | ICD-10-CM | POA: Insufficient documentation

## 2017-07-28 DIAGNOSIS — N183 Chronic kidney disease, stage 3 (moderate): Secondary | ICD-10-CM | POA: Diagnosis not present

## 2017-07-28 DIAGNOSIS — R0789 Other chest pain: Secondary | ICD-10-CM | POA: Insufficient documentation

## 2017-07-28 DIAGNOSIS — R1084 Generalized abdominal pain: Secondary | ICD-10-CM | POA: Diagnosis not present

## 2017-07-28 LAB — BASIC METABOLIC PANEL
ANION GAP: 9 (ref 5–15)
BUN: 37 mg/dL — ABNORMAL HIGH (ref 6–20)
CALCIUM: 8.9 mg/dL (ref 8.9–10.3)
CO2: 26 mmol/L (ref 22–32)
Chloride: 102 mmol/L (ref 101–111)
Creatinine, Ser: 1.77 mg/dL — ABNORMAL HIGH (ref 0.44–1.00)
GFR calc Af Amer: 31 mL/min — ABNORMAL LOW (ref >60)
GFR, EST NON AFRICAN AMERICAN: 27 mL/min — AB (ref >60)
Glucose, Bld: 130 mg/dL — ABNORMAL HIGH (ref 65–99)
POTASSIUM: 3.9 mmol/L (ref 3.5–5.1)
Sodium: 137 mmol/L (ref 135–145)

## 2017-07-28 LAB — URINALYSIS, ROUTINE W REFLEX MICROSCOPIC
Bacteria, UA: NONE SEEN
Bilirubin Urine: NEGATIVE
GLUCOSE, UA: NEGATIVE mg/dL
HGB URINE DIPSTICK: NEGATIVE
Ketones, ur: NEGATIVE mg/dL
Nitrite: NEGATIVE
PH: 6 (ref 5.0–8.0)
Protein, ur: NEGATIVE mg/dL
SPECIFIC GRAVITY, URINE: 1.012 (ref 1.005–1.030)

## 2017-07-28 LAB — I-STAT TROPONIN, ED
TROPONIN I, POC: 0.03 ng/mL (ref 0.00–0.08)
Troponin i, poc: 0 ng/mL (ref 0.00–0.08)

## 2017-07-28 LAB — CBC
HCT: 38.5 % (ref 36.0–46.0)
Hemoglobin: 12.5 g/dL (ref 12.0–15.0)
MCH: 29.6 pg (ref 26.0–34.0)
MCHC: 32.5 g/dL (ref 30.0–36.0)
MCV: 91 fL (ref 78.0–100.0)
Platelets: 265 10*3/uL (ref 150–400)
RBC: 4.23 MIL/uL (ref 3.87–5.11)
RDW: 13.4 % (ref 11.5–15.5)
WBC: 5.6 10*3/uL (ref 4.0–10.5)

## 2017-07-28 LAB — CBG MONITORING, ED: GLUCOSE-CAPILLARY: 136 mg/dL — AB (ref 65–99)

## 2017-07-28 MED ORDER — ASPIRIN 81 MG PO CHEW
162.0000 mg | CHEWABLE_TABLET | Freq: Once | ORAL | Status: AC
  Administered 2017-07-28: 162 mg via ORAL
  Filled 2017-07-28: qty 2

## 2017-07-28 NOTE — ED Triage Notes (Addendum)
Presents with left sided chest for "a few days and I went and had a mammogram and that hurt too" She states the pain radiates into her left arm and is described as aching. THe pain comes and goes and nothing brings it on, but my medications make it better.

## 2017-07-28 NOTE — Discharge Instructions (Addendum)
Your lab tests, chest xray and ekg are ok today, but given your family history it would benefit you to see a cardiologist for further testing or recommendations to confirm you are heart healthy.  Call Dr. Harl Bowie here locally (office here at [facility]) for an office visit. In the interim, return here for any new or worsening symptoms.

## 2017-07-28 NOTE — ED Provider Notes (Signed)
[facility] EMERGENCY DEPARTMENT Provider Note   CSN: [id number] Arrival date & time: 07/28/17  3545     History   Chief Complaint Chief Complaint  Patient presents with  . Chest Pain    HPI Emonnie [name] is a 77 y.o. female with a past medical history significant for hypertension, chronic kidney disease, hyperlipidemia, ulcerative colitis and anemia presenting with a several day history of left-sided chest pain which she describes as pressure, but also endorses left breast soreness for which she has recently undergone a diagnostic ultrasound and is awaiting follow-up with her primary doctor regarding this issue.  Her chest pain has occasional radiation into her left shoulder, she feels it is worse with exertion, states she was working in the yard yesterday when she had some discomfort but it has persisted since that time all night resolved briefly this morning before arrival here.  She denies shortness of breath associated with the pain, denies palpitations, tachycardia, dizziness.  She does have chronic abdominal pain from her ulcerative colitis and reports had some nausea several days ago but not currently.  She denies an active flare of her ulcerative colitis at this time but states this causes daily discomfort.  She reports taking 2 baby aspirin prior to arrival and also took a xanax last night for sleep and 1/2 tab of her oxycodone this am with some improvement in her pain.  HPI  Past Medical History:  Diagnosis Date  . Anemia due to blood loss 05/02/2011  . Anxiety   . Bipolar disorder ([place])   . Collagen vascular disease ([place])    ra  . Fibromyalgia   . Hyperglycemia, drug-induced 05/02/2011  . Hypertension   . Ulcerative colitis     Patient Active Problem List   Diagnosis Date Noted  . CKD (chronic kidney disease) stage 3, GFR 30-59 ml/min (HCC) 11/07/2015  . Hyperlipidemia with target LDL less than 100 01/24/2015  . Insomnia 01/24/2015  . Metabolic syndrome 62/56/3893  .  Bradycardia 05/03/2011  . Fibromyalgia 11/04/2010  . Depression 11/04/2010  . Essential hypertension 03/08/2007  . ULCERATIVE COLITIS, LEFT SIDED 03/08/2007    Past Surgical History:  Procedure Laterality Date  . ABDOMINAL HYSTERECTOMY    . BIOPSY N/A 12/28/2014   Procedure: SIGMOID AND RECTAL BIOPSIES;  Surgeon: Rogene Houston, MD;  Location: [facility];  Service: Endoscopy;  Laterality: N/A;  . CHOLECYSTECTOMY     s/p  . COLONOSCOPY  06/16/06. 04/27/2007   proctitis seen, biopsies showed chronic active colitis, no dysplasia   . FLEXIBLE SIGMOIDOSCOPY N/A 12/28/2014   Procedure: FLEXIBLE SIGMOIDOSCOPY WITH PROPOFOL;  Surgeon: Rogene Houston, MD;  Location: [facility];  Service: Endoscopy;  Laterality: N/A;     OB History   None      Home Medications    Prior to Admission medications   Medication Sig Start Date End Date Taking? Authorizing Provider  ALPRAZolam Duanne Moron) 0.5 MG tablet Take 1 Tablet by mouth 2 times a day as needed FOR ANXIETY 05/11/17  Yes Hassell Done, Mary-Margaret, FNP  antiseptic oral rinse (BIOTENE) LIQD 1 application by Mouth Rinse route daily. FOR DRY MOUTH   Yes [provider]  B Complex CAPS Take 1 capsule by mouth every morning.    Yes [provider]  benazepril-hydrochlorthiazide (LOTENSIN HCT) 20-12.5 MG tablet TAKE (1) TABLET BY MOUTH ONCE DAILY. 07/15/17  Yes Martin, Mary-Margaret, FNP  Cholecalciferol (VITAMIN D3) 2000 units TABS Take 1 capsule by mouth daily. Patient states that she alternates with  Hair, Skin, and Nails.   Yes [provider]  co-enzyme Q-10 50 MG capsule Take 50 mg by mouth once a week.    Yes [provider]  dicyclomine (BENTYL) 20 MG tablet TAKE 1/2 TABLET TWICE DAILY BEFORE A MEAL. 04/05/17  Yes Rehman, Mechele Dawley, MD  fenofibrate (TRICOR) 145 MG tablet Take 1 tablet (145 mg total) by mouth daily. 05/11/17  Yes Martin, Mary-Margaret, FNP  hydrocortisone (ANUSOL-HC) 2.5 % rectal cream Place 1 application  rectally 2 (two) times daily. 04/05/17  Yes Rehman, Mechele Dawley, MD  Multiple Vitamin (THERA) TABS Take 1 tablet by mouth daily.    Yes [provider]  Multiple Vitamins-Minerals (WOMENS MULTI VITAMIN & MINERAL PO) Take 1 tablet by mouth every other day.    Yes [provider]  naproxen sodium (ALEVE) 220 MG tablet Take 220 mg by mouth daily as needed (pain).   Yes [provider]  Omega-3 Fatty Acids (FISH OIL) 1000 MG CPDR Take 3 capsules by mouth. Patient takes 1 in the morning , 1 in the afternoon, and 1 at night.   Yes [provider]  ondansetron (ZOFRAN) 4 MG tablet TAKE 1 TABLET TWICE DAILY AS NEEDED FOR NAUSEA AND VOMITING. 04/05/17  Yes Rehman, Mechele Dawley, MD  OVER THE COUNTER MEDICATION Vitamin K -2 Patient states that she takes daily with her Vitamin D.   Yes [provider]  oxyCODONE (OXY IR/ROXICODONE) 5 MG immediate release tablet Take 1 tablet (5 mg total) by mouth 2 (two) times daily. 05/11/17  Yes Martin, Mary-Margaret, FNP  Probiotic Product (PROBIOTIC FORMULA PO) Take 1-2 capsules by mouth every morning.    Yes [provider]  vitamin A 10000 UNIT capsule Take 10,000 Units by mouth once a week.    Yes [provider]  vitamin C (ASCORBIC ACID) 500 MG tablet Take 500 mg by mouth once a week.    Yes [provider]  vitamin E 400 UNIT capsule Take 400 Units by mouth every morning.    Yes [provider]  zolpidem (AMBIEN) 5 MG tablet Take 1 tablet (5 mg total) by mouth at bedtime as needed. 05/11/17  Yes Martin, Mary-Margaret, FNP  sodium chloride (OCEAN) 0.65 % SOLN nasal spray Place 2 sprays into the nose as needed for congestion. 05/05/11 06/07/11  Delfina Redwood, MD    Family History Family History  Problem Relation Age of Onset  . Heart attack Mother 96       Died in her sleep  . Diabetes Mother   . Heart attack Father 50       Died in her sleep  . Diabetes Father   . Heart attack Brother 61         Defib, CABG  . Diabetes Brother   . Cancer Sister     Social History Social History   Tobacco Use  . Smoking status: Never Smoker  . Smokeless tobacco: Never Used  Substance Use Topics  . Alcohol use: Yes    Alcohol/week: 0.0 oz    Comment: Ocassionally a small glass of wine. Patient states that is has been 2 months since she drank anything.  . Drug use: No     Allergies   Azathioprine; Tape; Codeine; Lac bovis; Penicillins; Povidone-iodine; Procaine hcl; Sulfonamide derivatives; Tramadol; Acyclovir and related; Azithromycin; Clonazepam; Dairy aid [lactase]; Doxycycline; Latex; and Niacin and related   Review of Systems Review of Systems  Constitutional: Negative for fever.  HENT: Negative for  congestion and sore throat.   Eyes: Negative.   Respiratory: Negative for chest tightness and shortness of breath.   Cardiovascular: Positive for chest pain. Negative for palpitations and leg swelling.  Gastrointestinal: Positive for abdominal pain and nausea. Negative for vomiting.  Genitourinary: Negative.   Musculoskeletal: Negative for arthralgias, joint swelling and neck pain.  Skin: Negative.  Negative for rash and wound.  Neurological: Negative for dizziness, weakness, light-headedness, numbness and headaches.  Psychiatric/Behavioral: Negative.      Physical Exam Updated Vital Signs BP 127/80 (BP Location: Right Arm)   Pulse 80   Temp 97.6 F (36.4 C) (Oral)   Resp 12   Ht 5' 8"  (1.727 m)   Wt 74.4 kg (164 lb)   SpO2 97%   BMI 24.94 kg/m   Physical Exam  Constitutional: She appears well-developed and well-nourished.  HENT:  Head: Normocephalic and atraumatic.  Eyes: Conjunctivae are normal.  Neck: Normal range of motion.  Cardiovascular: Normal rate, regular rhythm, normal heart sounds and intact distal pulses.  Pulmonary/Chest: Effort normal and breath sounds normal. She has no wheezes.  Abdominal: Soft. Bowel sounds are normal. There is no tenderness.   Musculoskeletal: Normal range of motion.       Right lower leg: Normal. She exhibits no edema.       Left lower leg: Normal. She exhibits tenderness. She exhibits no edema.  ttp left anterior upper tibia with faint bruising noted.  Neurological: She is alert.  Skin: Skin is warm and dry.  Psychiatric: Her speech is normal and behavior is normal. Judgment and thought content normal. Cognition and memory are normal. She exhibits a depressed mood.  Flat affect  Nursing note and vitals reviewed.    ED Treatments / Results  Labs (all labs ordered are listed, but only abnormal results are displayed) Labs Reviewed  BASIC METABOLIC PANEL - Abnormal; Notable for the following components:      Result Value   Glucose, Bld 130 (*)    BUN 37 (*)    Creatinine, Ser 1.77 (*)    GFR calc non Af Amer 27 (*)    GFR calc Af Amer 31 (*)    All other components within normal limits  URINALYSIS, ROUTINE W REFLEX MICROSCOPIC - Abnormal; Notable for the following components:   Leukocytes, UA SMALL (*)    All other components within normal limits  CBG MONITORING, ED - Abnormal; Notable for the following components:   Glucose-Capillary 136 (*)    All other components within normal limits  CBC  I-STAT TROPONIN, ED  I-STAT TROPONIN, ED  I-STAT TROPONIN, ED    EKG ED ECG REPORT   Date: 07/28/2017  Rate: 83  Rhythm: normal sinus rhythm  QRS Axis: normal  Intervals: normal  ST/T Wave abnormalities: nonspecific T wave changes  Conduction Disutrbances:none  Narrative Interpretation:   Old EKG Reviewed: unchanged  I have personally reviewed the EKG tracing and agree with the computerized printout as noted.   Radiology Dg Chest Portable 1 View  Result Date: 07/28/2017 CLINICAL DATA:  Chest pain. EXAM: PORTABLE CHEST 1 VIEW COMPARISON:  05/30/2015 FINDINGS: The heart size is mildly enlarged. Aortic atherosclerosis. No pleural effusion or edema. No airspace opacities. Visualized bony thorax  appears intact. IMPRESSION: 1. Borderline cardiac enlargement. 2.  Aortic Atherosclerosis (ICD10-I70.0). Electronically Signed   By: Kerby Moors M.D.   On: 07/28/2017 09:37    Procedures Procedures (including critical care time)  Medications Ordered in ED Medications  aspirin chewable tablet  162 mg (162 mg Oral Given 07/28/17 0914)     Initial Impression / Assessment and Plan / ED Course  I have reviewed the triage vital signs and the nursing notes.  Pertinent labs & imaging results that were available during my care of the patient were reviewed by me and considered in my medical decision making (see chart for details).     Shortly after arrival, at recheck, pt denied chest pain, pain more consistent with her chronic ulcerative colitis discomfort, not new or different. Review of the chart, was seen in 2016 by Dr. Percival Spanish due to cp, ordered POET (plain old exercise test) which was not completed by pt.    Pt has a very flat affect, she endorses anxiety and depression, discussed loss of several extended family members causing depression for her. Also her brother started dialysis last week which has her concerned. She denies suicidal/homicidal ideation.    Delta trop x 2 negative, no sig changes on ekg, pt's sx stable with atypical sx, doubt this is angina. She does have risk factors, however, was referred to cardiology for further eval.  Referral given.  Pt seen by Dr. Roderic Palau prior to dc home.  Final Clinical Impressions(s) / ED Diagnoses   Final diagnoses:  Nonspecific chest pain    ED Discharge Orders    None       Landis Martins 07/28/17 1523    Milton Ferguson, MD 07/29/17 [phone]

## 2017-08-11 ENCOUNTER — Telehealth: Payer: Self-pay | Admitting: Nurse Practitioner

## 2017-08-11 ENCOUNTER — Encounter: Payer: Self-pay | Admitting: Nurse Practitioner

## 2017-08-11 ENCOUNTER — Ambulatory Visit: Payer: Medicare Other | Admitting: Nurse Practitioner

## 2017-08-11 VITALS — BP 128/73 | HR 82 | Temp 97.0°F | Ht 68.0 in | Wt 156.0 lb

## 2017-08-11 DIAGNOSIS — I10 Essential (primary) hypertension: Secondary | ICD-10-CM

## 2017-08-11 DIAGNOSIS — F3342 Major depressive disorder, recurrent, in full remission: Secondary | ICD-10-CM | POA: Diagnosis not present

## 2017-08-11 DIAGNOSIS — E785 Hyperlipidemia, unspecified: Secondary | ICD-10-CM | POA: Diagnosis not present

## 2017-08-11 DIAGNOSIS — E8881 Metabolic syndrome: Secondary | ICD-10-CM

## 2017-08-11 DIAGNOSIS — M797 Fibromyalgia: Secondary | ICD-10-CM | POA: Diagnosis not present

## 2017-08-11 DIAGNOSIS — K5150 Left sided colitis without complications: Secondary | ICD-10-CM

## 2017-08-11 DIAGNOSIS — N183 Chronic kidney disease, stage 3 unspecified: Secondary | ICD-10-CM

## 2017-08-11 DIAGNOSIS — F5101 Primary insomnia: Secondary | ICD-10-CM | POA: Diagnosis not present

## 2017-08-11 DIAGNOSIS — R739 Hyperglycemia, unspecified: Secondary | ICD-10-CM | POA: Diagnosis not present

## 2017-08-11 LAB — BAYER DCA HB A1C WAIVED: HB A1C (BAYER DCA - WAIVED): 6.2 % (ref <7.0)

## 2017-08-11 MED ORDER — OXYCODONE HCL 5 MG PO TABS: 5.0000 mg | ORAL_TABLET | Freq: Two times a day (BID) | ORAL | 0 refills | Status: DC

## 2017-08-11 MED ORDER — QUETIAPINE FUMARATE 100 MG PO TABS: 100.0000 mg | ORAL_TABLET | Freq: Every day | ORAL | 3 refills | Status: DC

## 2017-08-11 MED ORDER — BENAZEPRIL-HYDROCHLOROTHIAZIDE 20-12.5 MG PO TABS: ORAL_TABLET | ORAL | 0 refills | Status: DC

## 2017-08-11 MED ORDER — DICYCLOMINE HCL 20 MG PO TABS: ORAL_TABLET | ORAL | 5 refills | Status: DC

## 2017-08-11 MED ORDER — OXYCODONE HCL 5 MG PO TABS: 5.0000 mg | ORAL_TABLET | ORAL | 0 refills | Status: DC | PRN

## 2017-08-11 MED ORDER — ALPRAZOLAM 0.5 MG PO TABS: ORAL_TABLET | ORAL | 2 refills | Status: DC

## 2017-08-11 NOTE — Progress Notes (Signed)
Subjective:    Patient ID: [name], female    DOB: 1941-03-18, 77 y.o.   MRN: [id number]   Chief Complaint: Medical Management of Chronic Issues   HPI:  1. Hyperlipidemia with target LDL less than 100  Watches diet most days.  2. Essential hypertension  No c/o chest pain, sob or headache. Does not check blood pressure at home. BP Readings from Last 3 Encounters:  07/28/17 127/80  05/11/17 135/83  04/16/17 (!) 147/66     3. ULCERATIVE COLITIS, LEFT SIDED  Ok as long as she does not get stressed out.  Spicy foods can set it off  4. CKD (chronic kidney disease) stage 3, GFR 30-59 ml/min (HCC)  We are currently just watching labs  5. Primary insomnia  ambien to sleep unable to slep at all without it . Only sleeps a couple of hours with it.  6. Recurrent major depressive disorder, in full remission [facility])  Patient is on xanax 2x a day. She is depressed but depression screen was negative. Depression screen [facility] 2/9 08/11/2017 05/11/2017 02/09/2017  Decreased Interest 0 0 0  Down, Depressed, Hopeless 0 0 0  PHQ - 2 Score 0 0 0  Altered sleeping - - -  Tired, decreased energy - - -  Change in appetite - - -  Feeling bad or failure about yourself  - - -  Trouble concentrating - - -  Moving slowly or fidgety/restless - - -  Suicidal thoughts - - -  PHQ-9 Score - - -  Some recent data might be hidden     7. Fibromyalgia  Has constant pain. Is on oxycodone daily which brings pain down to about 3/10  8. Metabolic syndrome  Saw dr. [name] for her colitis and had hgba1c of 7.2- she does not have diagnosis of diabetes but we will recheck today.    Outpatient Encounter Medications as of 08/11/2017  Medication Sig  . ALPRAZolam (XANAX) 0.5 MG tablet Take 1 Tablet by mouth 2 times a day as needed FOR ANXIETY  . antiseptic oral rinse (BIOTENE) LIQD 1 application by Mouth Rinse route daily. FOR DRY MOUTH  . B Complex CAPS Take 1 capsule by mouth every morning.   .  benazepril-hydrochlorthiazide (LOTENSIN HCT) 20-12.5 MG tablet TAKE (1) TABLET BY MOUTH ONCE DAILY.  Marland Kitchen Cholecalciferol (VITAMIN D3) 2000 units TABS Take 1 capsule by mouth daily. Patient states that she alternates with Hair, Skin, and Nails.  Marland Kitchen co-enzyme Q-10 50 MG capsule Take 50 mg by mouth once a week.   . dicyclomine (BENTYL) 20 MG tablet TAKE 1/2 TABLET TWICE DAILY BEFORE A MEAL.  . fenofibrate (TRICOR) 145 MG tablet Take 1 tablet (145 mg total) by mouth daily.  . hydrocortisone (ANUSOL-HC) 2.5 % rectal cream Place 1 application rectally 2 (two) times daily.  . Multiple Vitamin (THERA) TABS Take 1 tablet by mouth daily.   . Multiple Vitamins-Minerals (WOMENS MULTI VITAMIN & MINERAL PO) Take 1 tablet by mouth every other day.   . naproxen sodium (ALEVE) 220 MG tablet Take 220 mg by mouth daily as needed (pain).  . Omega-3 Fatty Acids (FISH OIL) 1000 MG CPDR Take 3 capsules by mouth. Patient takes 1 in the morning , 1 in the afternoon, and 1 at night.  . ondansetron (ZOFRAN) 4 MG tablet TAKE 1 TABLET TWICE DAILY AS NEEDED FOR NAUSEA AND VOMITING.  . OVER THE COUNTER MEDICATION Vitamin K -2 Patient states that she takes daily with her Vitamin D.  Marland Kitchen  oxyCODONE (OXY IR/ROXICODONE) 5 MG immediate release tablet Take 1 tablet (5 mg total) by mouth 2 (two) times daily.  . Probiotic Product (PROBIOTIC FORMULA PO) Take 1-2 capsules by mouth every morning.   . vitamin A 10000 UNIT capsule Take 10,000 Units by mouth once a week.   . vitamin C (ASCORBIC ACID) 500 MG tablet Take 500 mg by mouth once a week.   . vitamin E 400 UNIT capsule Take 400 Units by mouth every morning.   . zolpidem (AMBIEN) 5 MG tablet Take 1 tablet (5 mg total) by mouth at bedtime as needed.       New complaints: - had abnormal breast U/s but said findings were benign and to follow up in 6 months which would be October.  Social history: Was planning on getting married and that has been canceled.   Review of Systems    Constitutional: Negative for activity change and appetite change.  HENT: Negative.   Eyes: Negative for pain.  Respiratory: Negative for shortness of breath.   Cardiovascular: Negative for chest pain, palpitations and leg swelling.  Gastrointestinal: Positive for abdominal pain, constipation, diarrhea and nausea.  Endocrine: Negative for polydipsia.  Genitourinary: Negative.   Musculoskeletal: Positive for myalgias.  Skin: Negative for rash.  Neurological: Negative for dizziness, weakness and headaches.  Hematological: Does not bruise/bleed easily.  Psychiatric/Behavioral: Negative.   All other systems reviewed and are negative.      Objective:   Physical Exam  Constitutional: She is oriented to person, place, and time.  HENT:  Head: Normocephalic.  Nose: Nose normal.  Mouth/Throat: Oropharynx is clear and moist.  Eyes: Pupils are equal, round, and reactive to light. EOM are normal.  Neck: Normal range of motion. Neck supple. No JVD present. Carotid bruit is not present.  Cardiovascular: Normal rate, regular rhythm, normal heart sounds and intact distal pulses.  Pulmonary/Chest: Effort normal and breath sounds normal. No respiratory distress. She has no wheezes. She has no rales. She exhibits no tenderness.  Abdominal: Soft. Normal appearance, normal aorta and bowel sounds are normal. She exhibits no distension, no abdominal bruit, no pulsatile midline mass and no mass. There is no splenomegaly or hepatomegaly. There is tenderness.  Musculoskeletal: Normal range of motion. She exhibits no edema.  Point tenderness up and down spinal column on either side  Lymphadenopathy:    She has no cervical adenopathy.  Neurological: She is alert and oriented to person, place, and time. She has normal reflexes.  Skin: Skin is warm and dry.  Psychiatric: She has a normal mood and affect. Her behavior is normal. Judgment and thought content normal.   BP 128/73   Pulse 82   Temp (!) 97 F  (36.1 C) (Oral)   Ht _0  (1.727 m)   Wt 156 lb (70.8 kg)   BMI 23.72 kg/m   hgba1c 6.2%    Assessment & Plan:  Necole Minassian comes in today with chief complaint of Medical Management of Chronic Issues   Diagnosis and orders addressed:  1. Hyperlipidemia with target LDL less than 100 Low fat diet - Lipid panel  2. Essential hypertension Low sodium diet - CMP14+EGFR - benazepril-hydrochlorthiazide (LOTENSIN HCT) 20-12.5 MG tablet; TAKE (1) TABLET BY MOUTH ONCE DAILY.  Dispense: 90 tablet; Refill: 0  3. ULCERATIVE COLITIS, LEFT SIDED Watch spicy and fatty foods - oxyCODONE (OXY IR/ROXICODONE) 5 MG immediate release tablet; Take 1 tablet (5 mg total) by mouth 2 (two) times daily.  Dispense: 60 tablet; Refill:  0 - dicyclomine (BENTYL) 20 MG tablet; TAKE 1/2 TABLET TWICE DAILY BEFORE A MEAL.  Dispense: 30 tablet; Refill: 5 - oxyCODONE (ROXICODONE) 5 MG immediate release tablet; Take 1 tablet (5 mg total) by mouth every 4 (four) hours as needed for severe pain.  Dispense: 30 tablet; Refill: 0 - oxyCODONE (OXY IR/ROXICODONE) 5 MG immediate release tablet; Take 1 tablet (5 mg total) by mouth every 4 (four) hours as needed for severe pain.  Dispense: 30 tablet; Refill: 0  4. CKD (chronic kidney disease) stage 3, GFR 30-59 ml/min (HCC) Labs pendong  5. Primary insomnia Going to hold ambien Try seroquel for sleep and see if it will also help with her mood - QUEtiapine (SEROQUEL) 100 MG tablet; Take 1 tablet (100 mg total) by mouth at bedtime.  Dispense: 30 tablet; Refill: 3  6. Recurrent major depressive disorder, in full remission ([place]) Continue xanax as rx- Going to see if seroquel will help with nmood and sleep - ALPRAZolam (XANAX) 0.5 MG tablet; Take 1 Tablet by mouth 2 times a day as needed FOR ANXIETY  Dispense: 60 tablet; Refill: 2  7. Fibromyalgia exercise  8. Metabolic syndrome Watch carebsin diet - Bayer DCA Hb A1c Waived - Microalbumin / creatinine urine  ratio   Labs pending Health Maintenance reviewed Diet and exercise encouraged  Follow up plan: 3 months   Mary-Margaret Hassell Done, FNP

## 2017-08-11 NOTE — Patient Instructions (Signed)

## 2017-08-12 ENCOUNTER — Telehealth: Payer: Self-pay | Admitting: Nurse Practitioner

## 2017-08-12 ENCOUNTER — Other Ambulatory Visit: Payer: Self-pay | Admitting: Nurse Practitioner

## 2017-08-12 DIAGNOSIS — K5150 Left sided colitis without complications: Secondary | ICD-10-CM

## 2017-08-12 LAB — CMP14+EGFR
ALBUMIN: 4.3 g/dL (ref 3.5–4.8)
ALK PHOS: 59 IU/L (ref 39–117)
ALT: 21 IU/L (ref 0–32)
AST: 29 IU/L (ref 0–40)
Albumin/Globulin Ratio: 1.8 (ref 1.2–2.2)
BUN/Creatinine Ratio: 17 (ref 12–28)
BUN: 37 mg/dL — AB (ref 8–27)
Bilirubin Total: 0.4 mg/dL (ref 0.0–1.2)
CALCIUM: 9.6 mg/dL (ref 8.7–10.3)
CO2: 23 mmol/L (ref 20–29)
CREATININE: 2.22 mg/dL — AB (ref 0.57–1.00)
Chloride: 101 mmol/L (ref 96–106)
GFR calc Af Amer: 24 mL/min/{1.73_m2} — ABNORMAL LOW (ref >59)
GFR, EST NON AFRICAN AMERICAN: 21 mL/min/{1.73_m2} — AB (ref >59)
GLUCOSE: 116 mg/dL — AB (ref 65–99)
Globulin, Total: 2.4 g/dL (ref 1.5–4.5)
Potassium: 4.5 mmol/L (ref 3.5–5.2)
Sodium: 139 mmol/L (ref 134–144)
TOTAL PROTEIN: 6.7 g/dL (ref 6.0–8.5)

## 2017-08-12 LAB — LIPID PANEL
CHOL/HDL RATIO: 3.2 ratio (ref 0.0–4.4)
Cholesterol, Total: 149 mg/dL (ref 100–199)
HDL: 47 mg/dL (ref >39)
LDL Calculated: 78 mg/dL (ref 0–99)
TRIGLYCERIDES: 121 mg/dL (ref 0–149)
VLDL Cholesterol Cal: 24 mg/dL (ref 5–40)

## 2017-08-12 MED ORDER — OXYCODONE HCL 5 MG PO TABS: 5.0000 mg | ORAL_TABLET | Freq: Two times a day (BID) | ORAL | 0 refills | Status: DC

## 2017-08-12 NOTE — Telephone Encounter (Signed)
PT states that she thinks that her medicine might have been called in wrong its her seroquel and oxycodone, she hasn't went to pick it up yet because what was sent to pharmacy is different than what is on her AVS? Pt wants to speak to MMM about this

## 2017-08-12 NOTE — Telephone Encounter (Signed)
Has been taken care

## 2017-08-12 NOTE — Telephone Encounter (Signed)
Called and spoke with patient.  Patient states that pharmacy states that oxycodone was only sent to pharmacy for 30 tabs.  Informed patient that all 3 oxycodone Rxs were sent for 60 tabs.

## 2017-08-20 ENCOUNTER — Other Ambulatory Visit: Payer: Self-pay

## 2017-08-20 ENCOUNTER — Encounter: Payer: Self-pay | Admitting: Cardiology

## 2017-08-20 ENCOUNTER — Ambulatory Visit: Payer: Medicare Other | Admitting: Cardiology

## 2017-08-20 ENCOUNTER — Telehealth: Payer: Self-pay | Admitting: Cardiology

## 2017-08-20 VITALS — BP 136/77 | HR 90 | Ht 68.0 in | Wt 155.0 lb

## 2017-08-20 DIAGNOSIS — R0789 Other chest pain: Secondary | ICD-10-CM | POA: Diagnosis not present

## 2017-08-20 DIAGNOSIS — R011 Cardiac murmur, unspecified: Secondary | ICD-10-CM

## 2017-08-20 NOTE — Progress Notes (Signed)
Clinical Summary [name] is a 77 y.o.female seen as new consult, referred by NP [facility] for chest pain.   1. Chest pain - ER visit 07/2017 with chest pain. Trop neg x 2. Symptoms reported as atypical.  - went to ER, pain entire left chest and abdomen. Sharp/pressure. Does not remrember if positional. Pain lasted several hours constant. Better with oxycodone and xanax.  - similar pains in the past - highest activitiy yardwork, can have some DOE with this activities. Chronic unchanged.      Past Medical History:  Diagnosis Date  . Anemia due to blood loss 05/02/2011  . Anxiety   . Bipolar disorder ([place])   . Collagen vascular disease ([place])    ra  . Fibromyalgia   . Hyperglycemia, drug-induced 05/02/2011  . Hypertension   . Ulcerative colitis      Allergies  Allergen Reactions  . Azathioprine Nausea And Vomiting and Other (See Comments)    SEVERE NAUSEA AND VOMITING WITH CHEST PAIN-  PATIENT INSISTS NEVER TO BE GIVEN ANYTHING SIMILAR TO THIS  . Tape Other (See Comments) and Rash    BLISTERS AND SKIN TEARING  . Codeine Nausea And Vomiting  . Lac Bovis Nausea And Vomiting  . Penicillins Hives  . Povidone-Iodine Hives    BETADINE  . Procaine Hcl Other (See Comments)    SEVERE GI UPSET (NAUSEA,VOMITING AND DIARRHEA)  . Sulfonamide Derivatives Hives  . Tramadol Itching  . Acyclovir And Related   . Azithromycin     Mycins   . Clonazepam Other (See Comments)    Skin burning, insomnia, diarrhea.  . Dairy Aid [Lactase]   . Doxycycline Rash  . Latex Rash  . Niacin And Related Rash     Current Outpatient Medications  Medication Sig Dispense Refill  . ALPRAZolam (XANAX) 0.5 MG tablet Take 1 Tablet by mouth 2 times a day as needed FOR ANXIETY 60 tablet 2  . antiseptic oral rinse (BIOTENE) LIQD 1 application by Mouth Rinse route daily. FOR DRY MOUTH    . B Complex CAPS Take 1 capsule by mouth every morning.     . benazepril-hydrochlorthiazide (LOTENSIN HCT) 20-12.5 MG  tablet TAKE (1) TABLET BY MOUTH ONCE DAILY. 90 tablet 0  . Cholecalciferol (VITAMIN D3) 2000 units TABS Take 1 capsule by mouth daily. Patient states that she alternates with Hair, Skin, and Nails.    Marland Kitchen co-enzyme Q-10 50 MG capsule Take 50 mg by mouth once a week.     . dicyclomine (BENTYL) 20 MG tablet TAKE 1/2 TABLET TWICE DAILY BEFORE A MEAL. 30 tablet 5  . fenofibrate (TRICOR) 145 MG tablet Take 1 tablet (145 mg total) by mouth daily. 30 tablet 5  . hydrocortisone (ANUSOL-HC) 2.5 % rectal cream Place 1 application rectally 2 (two) times daily. 30 g 1  . Multiple Vitamin (THERA) TABS Take 1 tablet by mouth daily.     . Multiple Vitamins-Minerals (WOMENS MULTI VITAMIN & MINERAL PO) Take 1 tablet by mouth every other day.     . naproxen sodium (ALEVE) 220 MG tablet Take 220 mg by mouth daily as needed (pain).    . Omega-3 Fatty Acids (FISH OIL) 1000 MG CPDR Take 3 capsules by mouth. Patient takes 1 in the morning , 1 in the afternoon, and 1 at night.    . ondansetron (ZOFRAN) 4 MG tablet TAKE 1 TABLET TWICE DAILY AS NEEDED FOR NAUSEA AND VOMITING. 20 tablet 1  . OVER THE COUNTER MEDICATION  Vitamin K -2 Patient states that she takes daily with her Vitamin D.    . [START ON 10/10/2017] oxyCODONE (OXY IR/ROXICODONE) 5 MG immediate release tablet Take 1 tablet (5 mg total) by mouth 2 (two) times daily. 60 tablet 0  . [START ON 09/11/2017] oxyCODONE (OXY IR/ROXICODONE) 5 MG immediate release tablet Take 1 tablet (5 mg total) by mouth 2 (two) times daily. 60 tablet 0  . [START ON 10/11/2017] oxyCODONE (ROXICODONE) 5 MG immediate release tablet Take 1 tablet (5 mg total) by mouth 2 (two) times daily. 60 tablet 0  . Probiotic Product (PROBIOTIC FORMULA PO) Take 1-2 capsules by mouth every morning.     Marland Kitchen QUEtiapine (SEROQUEL) 100 MG tablet Take 1 tablet (100 mg total) by mouth at bedtime. 30 tablet 3  . vitamin A 10000 UNIT capsule Take 10,000 Units by mouth once a week.     . vitamin C (ASCORBIC ACID) 500 MG  tablet Take 500 mg by mouth once a week.     . vitamin E 400 UNIT capsule Take 400 Units by mouth every morning.     . zolpidem (AMBIEN) 5 MG tablet Take 1 tablet (5 mg total) by mouth at bedtime as needed. 30 tablet 2   No current facility-administered medications for this visit.      Past Surgical History:  Procedure Laterality Date  . ABDOMINAL HYSTERECTOMY    . BIOPSY N/A 12/28/2014   Procedure: SIGMOID AND RECTAL BIOPSIES;  Surgeon: Rogene Houston, MD;  Location: [facility];  Service: Endoscopy;  Laterality: N/A;  . CHOLECYSTECTOMY     s/p  . COLONOSCOPY  06/16/06. 04/27/2007   proctitis seen, biopsies showed chronic active colitis, no dysplasia   . FLEXIBLE SIGMOIDOSCOPY N/A 12/28/2014   Procedure: FLEXIBLE SIGMOIDOSCOPY WITH PROPOFOL;  Surgeon: Rogene Houston, MD;  Location: [facility];  Service: Endoscopy;  Laterality: N/A;     Allergies  Allergen Reactions  . Azathioprine Nausea And Vomiting and Other (See Comments)    SEVERE NAUSEA AND VOMITING WITH CHEST PAIN-  PATIENT INSISTS NEVER TO BE GIVEN ANYTHING SIMILAR TO THIS  . Tape Other (See Comments) and Rash    BLISTERS AND SKIN TEARING  . Codeine Nausea And Vomiting  . Lac Bovis Nausea And Vomiting  . Penicillins Hives  . Povidone-Iodine Hives    BETADINE  . Procaine Hcl Other (See Comments)    SEVERE GI UPSET (NAUSEA,VOMITING AND DIARRHEA)  . Sulfonamide Derivatives Hives  . Tramadol Itching  . Acyclovir And Related   . Azithromycin     Mycins   . Clonazepam Other (See Comments)    Skin burning, insomnia, diarrhea.  . Dairy Aid [Lactase]   . Doxycycline Rash  . Latex Rash  . Niacin And Related Rash      Family History  Problem Relation Age of Onset  . Heart attack Mother 32       Died in her sleep  . Diabetes Mother   . Heart attack Father 46       Died in her sleep  . Diabetes Father   . Heart attack Brother 45       Defib, CABG  . Diabetes Brother   . Cancer Sister      Social History [name]  reports that she has never smoked. She has never used smokeless tobacco. [name] reports that she drinks alcohol.   Review of Systems CONSTITUTIONAL: No weight loss, fever, chills, weakness or fatigue.  HEENT: Eyes: No  visual loss, blurred vision, double vision or yellow sclerae.No hearing loss, sneezing, congestion, runny nose or sore throat.  SKIN: No rash or itching.  CARDIOVASCULAR: per hpi RESPIRATORY: per hpi.  GASTROINTESTINAL: No anorexia, nausea, vomiting or diarrhea. No abdominal pain or blood.  GENITOURINARY: No burning on urination, no polyuria NEUROLOGICAL: No headache, dizziness, syncope, paralysis, ataxia, numbness or tingling in the extremities. No change in bowel or bladder control.  MUSCULOSKELETAL: No muscle, back pain, joint pain or stiffness.  LYMPHATICS: No enlarged nodes. No history of splenectomy.  PSYCHIATRIC: No history of depression or anxiety.  ENDOCRINOLOGIC: No reports of sweating, cold or heat intolerance. No polyuria or polydipsia.  Marland Kitchen   Physical Examination Vitals:   08/20/17 1414  BP: 136/77  Pulse: 90  SpO2: 98%   Vitals:   08/20/17 1414  Weight: 155 lb (70.3 kg)  Height: 5' 8"  (1.727 m)    Gen: resting comfortably, no acute distress HEENT: no scleral icterus, pupils equal round and reactive, no palptable cervical adenopathy,  CV: RRR, 2/ 6 systolic murmur rusb Resp: Clear to auscultation bilaterally GI: abdomen is soft, non-tender, non-distended, normal bowel sounds, no hepatosplenomegaly MSK: extremities are warm, no edema.  Skin: warm, no rash Neuro:  no focal deficits Psych: appropriate affect     Assessment and Plan  1. Chest pain - atypicla symptoms, no plans for ischemic testing at this time   2. Heart murmur - obtain echo   F/u 1 year Arnoldo Lenis, M.D..

## 2017-08-20 NOTE — Telephone Encounter (Signed)
Echo scheduled in [facility] on September 15, 2017

## 2017-08-20 NOTE — Patient Instructions (Signed)
Your physician wants you to follow-up in: [place] will receive a reminder letter in the mail two months in advance. If you don't receive a letter, please call our office to schedule the follow-up appointment.  Your physician recommends that you continue on your current medications as directed. Please refer to the Current Medication list given to you today.  Your physician has requested that you have an echocardiogram. Echocardiography is a painless test that uses sound waves to create images of your heart. It provides your doctor with information about the size and shape of your heart and how well your heart's chambers and valves are working. This procedure takes approximately one hour. There are no restrictions for this procedure.  Thank you for choosing [facility]!!

## 2017-08-26 ENCOUNTER — Encounter: Payer: Self-pay | Admitting: Cardiology

## 2017-08-26 ENCOUNTER — Encounter: Payer: Self-pay | Admitting: *Deleted

## 2017-09-02 ENCOUNTER — Telehealth: Payer: Self-pay | Admitting: Nurse Practitioner

## 2017-09-02 DIAGNOSIS — N183 Chronic kidney disease, stage 3 unspecified: Secondary | ICD-10-CM

## 2017-09-02 NOTE — Telephone Encounter (Signed)
LMTCB URGENT 09/02/17 gr

## 2017-09-03 ENCOUNTER — Emergency Department (HOSPITAL_COMMUNITY)
Admission: EM | Admit: 2017-09-03 | Discharge: 2017-09-04 | Disposition: A | Discharge status: Home / Self Care | Payer: Medicare Other | Attending: Emergency Medicine | Admitting: Emergency Medicine

## 2017-09-03 ENCOUNTER — Ambulatory Visit: Payer: Medicare Other

## 2017-09-03 ENCOUNTER — Other Ambulatory Visit: Payer: Self-pay

## 2017-09-03 ENCOUNTER — Encounter (HOSPITAL_COMMUNITY): Payer: Self-pay | Admitting: Emergency Medicine

## 2017-09-03 DIAGNOSIS — I129 Hypertensive chronic kidney disease with stage 1 through stage 4 chronic kidney disease, or unspecified chronic kidney disease: Secondary | ICD-10-CM | POA: Insufficient documentation

## 2017-09-03 DIAGNOSIS — R1030 Lower abdominal pain, unspecified: Secondary | ICD-10-CM | POA: Diagnosis not present

## 2017-09-03 DIAGNOSIS — Z91040 Latex allergy status: Secondary | ICD-10-CM | POA: Insufficient documentation

## 2017-09-03 DIAGNOSIS — R350 Frequency of micturition: Secondary | ICD-10-CM | POA: Insufficient documentation

## 2017-09-03 DIAGNOSIS — N183 Chronic kidney disease, stage 3 (moderate): Secondary | ICD-10-CM | POA: Diagnosis not present

## 2017-09-03 DIAGNOSIS — Z79899 Other long term (current) drug therapy: Secondary | ICD-10-CM | POA: Insufficient documentation

## 2017-09-03 DIAGNOSIS — K5900 Constipation, unspecified: Secondary | ICD-10-CM | POA: Insufficient documentation

## 2017-09-03 DIAGNOSIS — K6289 Other specified diseases of anus and rectum: Secondary | ICD-10-CM | POA: Insufficient documentation

## 2017-09-03 DIAGNOSIS — R109 Unspecified abdominal pain: Secondary | ICD-10-CM | POA: Diagnosis not present

## 2017-09-03 DIAGNOSIS — K5730 Diverticulosis of large intestine without perforation or abscess without bleeding: Secondary | ICD-10-CM | POA: Diagnosis not present

## 2017-09-03 LAB — URINALYSIS, ROUTINE W REFLEX MICROSCOPIC
Bacteria, UA: NONE SEEN
Bilirubin Urine: NEGATIVE
Glucose, UA: NEGATIVE mg/dL
Hgb urine dipstick: NEGATIVE
Ketones, ur: NEGATIVE mg/dL
Nitrite: NEGATIVE
Protein, ur: NEGATIVE mg/dL
Specific Gravity, Urine: 1.014 (ref 1.005–1.030)
pH: 6 (ref 5.0–8.0)

## 2017-09-03 LAB — CBC WITH DIFFERENTIAL/PLATELET
Basophils Absolute: 0 10*3/uL (ref 0.0–0.1)
Basophils Relative: 1 %
Eosinophils Absolute: 0.1 10*3/uL (ref 0.0–0.7)
Eosinophils Relative: 1 %
HCT: 35 % — ABNORMAL LOW (ref 36.0–46.0)
Hemoglobin: 11.5 g/dL — ABNORMAL LOW (ref 12.0–15.0)
Lymphocytes Relative: 26 %
Lymphs Abs: 1.6 10*3/uL (ref 0.7–4.0)
MCH: 29.6 pg (ref 26.0–34.0)
MCHC: 32.9 g/dL (ref 30.0–36.0)
MCV: 90.2 fL (ref 78.0–100.0)
Monocytes Absolute: 0.6 10*3/uL (ref 0.1–1.0)
Monocytes Relative: 10 %
Neutro Abs: 4.1 10*3/uL (ref 1.7–7.7)
Neutrophils Relative %: 62 %
Platelets: 334 10*3/uL (ref 150–400)
RBC: 3.88 MIL/uL (ref 3.87–5.11)
RDW: 13.4 % (ref 11.5–15.5)
WBC: 6.4 10*3/uL (ref 4.0–10.5)

## 2017-09-03 NOTE — Telephone Encounter (Signed)
Aware of results. Referral placed

## 2017-09-03 NOTE — Addendum Note (Signed)
Addended by: Wardell Heath on: 09/03/2017 10:48 AM   Modules accepted: Orders

## 2017-09-03 NOTE — ED Triage Notes (Signed)
Pt reports receiving a letter from her [facility] office saying that her creatinine was elevated at 2.2. Pt reports urinary symptoms, pain in lower abd/vaginal area. Pt also reports small BM this afternoon (hard stool). Pt reports rectal pain x 1 week.

## 2017-09-03 NOTE — ED Provider Notes (Signed)
[facility] EMERGENCY DEPARTMENT Provider Note   CSN: [id number] Arrival date & time: 09/03/17  2130     History   Chief Complaint Chief Complaint  Patient presents with  . Urinary Frequency    vaginal pain    HPI [name] is a 77 y.o. female.  [name] is a 77 y.o. Female with a history of ulcerative colitis, hypertension, fibromyalgia, rheumatoid arthritis, anemia and anxiety, who presents to the emergency department for evaluation of 1 week of lower abdominal pain and rectal pain.  Patient reports she does have history of ulcerative colitis, but became and has been worse over the past week, she does take chronic oxycodone for this.  Patient reports one bloody bowel movement approximately 1 week ago, none since then, she has had intermittent constipation but was able to pass a small hard bowel movement this afternoon.  She reports some mild nausea but denies any vomiting.  No fevers or chills.  She does report that intermittently over the past month and worsening today she is experienced urinary frequency and burning and discomfort when she urinates.  She denies any vaginal discharge or vaginal bleeding.  No chest pain or shortness of breath.  Patient reports she has been eating some but has had decreased appetite.     Past Medical History:  Diagnosis Date  . Anemia due to blood loss 05/02/2011  . Anxiety   . Bipolar disorder ([place])   . Collagen vascular disease ([place])    ra  . Fibromyalgia   . Hyperglycemia, drug-induced 05/02/2011  . Hypertension   . Ulcerative colitis     Patient Active Problem List   Diagnosis Date Noted  . CKD (chronic kidney disease) stage 3, GFR 30-59 ml/min (HCC) 11/07/2015  . Hyperlipidemia with target LDL less than 100 01/24/2015  . Insomnia 01/24/2015  . Metabolic syndrome 66/44/0347  . Bradycardia 05/03/2011  . Fibromyalgia 11/04/2010  . Depression 11/04/2010  . Essential hypertension 03/08/2007  . ULCERATIVE COLITIS, LEFT SIDED  03/08/2007    Past Surgical History:  Procedure Laterality Date  . ABDOMINAL HYSTERECTOMY    . BIOPSY N/A 12/28/2014   Procedure: SIGMOID AND RECTAL BIOPSIES;  Surgeon: Rogene Houston, MD;  Location: [facility];  Service: Endoscopy;  Laterality: N/A;  . CHOLECYSTECTOMY     s/p  . COLONOSCOPY  06/16/06. 04/27/2007   proctitis seen, biopsies showed chronic active colitis, no dysplasia   . FLEXIBLE SIGMOIDOSCOPY N/A 12/28/2014   Procedure: FLEXIBLE SIGMOIDOSCOPY WITH PROPOFOL;  Surgeon: Rogene Houston, MD;  Location: [facility];  Service: Endoscopy;  Laterality: N/A;     OB History   None      Home Medications    Prior to Admission medications   Medication Sig Start Date End Date Taking? Authorizing Provider  ALPRAZolam Duanne Moron) 0.5 MG tablet Take 1 Tablet by mouth 2 times a day as needed FOR ANXIETY Patient taking differently: Take 0.5 mg by mouth 2 (two) times daily as needed for anxiety.  08/11/17  Yes Hassell Done, Mary-Margaret, FNP  antiseptic oral rinse (BIOTENE) LIQD 1 application by Mouth Rinse route daily. FOR DRY MOUTH   Yes [provider]  B Complex CAPS Take 1 capsule by mouth every morning.    Yes [provider]  benazepril-hydrochlorthiazide (LOTENSIN HCT) 20-12.5 MG tablet TAKE (1) TABLET BY MOUTH ONCE DAILY. 08/11/17  Yes Martin, Mary-Margaret, FNP  Cholecalciferol (VITAMIN D3) 2000 units TABS Take 1 capsule by mouth daily. Patient states that she alternates with Hair, Skin,  and Nails.   Yes [provider]  co-enzyme Q-10 50 MG capsule Take 50 mg by mouth once a week.    Yes [provider]  dicyclomine (BENTYL) 20 MG tablet TAKE 1/2 TABLET TWICE DAILY BEFORE A MEAL. Patient taking differently: Take 10 mg by mouth 2 (two) times daily.  08/11/17  Yes Martin, Mary-Margaret, FNP  fenofibrate (TRICOR) 145 MG tablet Take 1 tablet (145 mg total) by mouth daily. 05/11/17  Yes Martin, Mary-Margaret, FNP  hydrocortisone (ANUSOL-HC) 2.5 % rectal cream Place  1 application rectally 2 (two) times daily. 04/05/17  Yes Rehman, Mechele Dawley, MD  Multiple Vitamin (THERA) TABS Take 1 tablet by mouth daily.    Yes [provider]  Multiple Vitamins-Minerals (WOMENS MULTI VITAMIN & MINERAL PO) Take 1 tablet by mouth every other day.    Yes [provider]  Omega-3 Fatty Acids (FISH OIL) 1000 MG CPDR Take 3 capsules by mouth. Patient takes 1 in the morning , 1 in the afternoon, and 1 at night.   Yes [provider]  ondansetron (ZOFRAN) 4 MG tablet TAKE 1 TABLET TWICE DAILY AS NEEDED FOR NAUSEA AND VOMITING. Patient taking differently: Take 2-4 mg by mouth 2 (two) times daily as needed for nausea.  04/05/17  Yes Rehman, Mechele Dawley, MD  OVER THE COUNTER MEDICATION Take 1 tablet by mouth daily. Vitamin K -2 Patient states that she takes daily with her Vitamin D.    Yes [provider]  oxyCODONE (OXY IR/ROXICODONE) 5 MG immediate release tablet Take 1 tablet (5 mg total) by mouth 2 (two) times daily. 09/11/17 10/11/17 Yes Martin, Mary-Margaret, FNP  Probiotic Product (PROBIOTIC FORMULA PO) Take 1-2 capsules by mouth every morning.    Yes [provider]  QUEtiapine (SEROQUEL) 100 MG tablet Take 1 tablet (100 mg total) by mouth at bedtime. 08/11/17  Yes Hassell Done, Mary-Margaret, FNP  shark liver oil-cocoa butter (PREPARATION H) 0.25-3-85.5 % suppository Place 1 suppository rectally as needed for hemorrhoids.   Yes [provider]  vitamin A 10000 UNIT capsule Take 10,000 Units by mouth once a week.    Yes [provider]  vitamin C (ASCORBIC ACID) 500 MG tablet Take 500 mg by mouth once a week.    Yes [provider]  vitamin E 400 UNIT capsule Take 400 Units by mouth every morning.    Yes [provider]  oxyCODONE (OXY IR/ROXICODONE) 5 MG immediate release tablet Take 1 tablet (5 mg total) by mouth 2 (two) times daily. Patient not taking: Reported on 09/03/2017 10/10/17 11/09/17  [name],  FNP  oxyCODONE (ROXICODONE) 5 MG immediate release tablet Take 1 tablet (5 mg total) by mouth 2 (two) times daily. Patient not taking: Reported on 09/03/2017 10/11/17 11/10/17  [name], FNP  sodium chloride (OCEAN) 0.65 % SOLN nasal spray Place 2 sprays into the nose as needed for congestion. 05/05/11 06/07/11  Delfina Redwood, MD    Family History Family History  Problem Relation Age of Onset  . Heart attack Mother 96       Died in her sleep  . Diabetes Mother   . Heart attack Father 46       Died in her sleep  . Diabetes Father   . Heart attack Brother 107       Defib, CABG  . Diabetes Brother   . Cancer Sister     Social History Social History   Tobacco Use  . Smoking status: Never Smoker  .  Smokeless tobacco: Never Used  Substance Use Topics  . Alcohol use: Yes    Alcohol/week: 0.0 oz    Comment: Ocassionally a small glass of wine. Patient states that is has been 2 months since she drank anything.  . Drug use: No     Allergies   Azathioprine; Tape; Codeine; Lac bovis; Penicillins; Povidone-iodine; Procaine hcl; Sulfonamide derivatives; Tramadol; Acyclovir and related; Azithromycin; Clonazepam; Dairy aid [lactase]; Doxycycline; Latex; and Niacin and related   Review of Systems Review of Systems  Constitutional: Negative for chills and fever.  HENT: Negative.   Eyes: Negative for visual disturbance.  Respiratory: Negative for cough and shortness of breath.   Cardiovascular: Negative for chest pain.  Gastrointestinal: Positive for abdominal pain, constipation and nausea. Negative for blood in stool, diarrhea and vomiting.  Genitourinary: Positive for dysuria and frequency. Negative for flank pain, vaginal bleeding and vaginal discharge.  Musculoskeletal: Negative for arthralgias and myalgias.  Skin: Negative for color change, pallor and rash.  Neurological: Negative for dizziness, syncope and light-headedness.     Physical Exam Updated Vital  Signs BP (!) 155/94 (BP Location: Right Arm)   Pulse 86   Temp (!) 97.5 F (36.4 C) (Oral)   Resp 20   Ht 5' 8"  (1.727 m)   Wt 70.3 kg (155 lb)   SpO2 99%   BMI 23.57 kg/m   Physical Exam  Constitutional: She is oriented to person, place, and time. She appears well-developed and well-nourished. No distress.  HENT:  Head: Normocephalic and atraumatic.  Mouth/Throat: Oropharynx is clear and moist.  Eyes: Right eye exhibits no discharge. Left eye exhibits no discharge.  Neck: Neck supple.  Cardiovascular: Normal rate, regular rhythm, normal heart sounds and intact distal pulses.  Pulmonary/Chest: Effort normal and breath sounds normal. No stridor. No respiratory distress. She has no wheezes. She has no rales.  Respirations equal and unlabored, patient able to speak in full sentences, lungs clear to auscultation bilaterally  Abdominal: Soft. Bowel sounds are normal. She exhibits no distension and no mass. There is tenderness. There is guarding. There is no rebound.  Abdomen is nondistended, soft, bowel sounds present throughout, patient with diffuse tenderness which is most severe across the lower abdomen where she has guarding throughout  Genitourinary:  Genitourinary Comments: Chaperone present for general exam. Vulva with very minimal erythema, no discharge noted, wet prep collected Rectal exam without evidence of internal or external hemorrhoids, no palpable abscess, minimal tenderness on exam, soft brown stool present in the rectal vault, no gross blood  Musculoskeletal: She exhibits no edema or deformity.  Neurological: She is alert and oriented to person, place, and time. Coordination normal.  Skin: Skin is warm and dry. Capillary refill takes less than 2 seconds. She is not diaphoretic.  Psychiatric: She has a normal mood and affect. Her behavior is normal.  Nursing note and vitals reviewed.    ED Treatments / Results  Labs (all labs ordered are listed, but only abnormal  results are displayed) Labs Reviewed  WET PREP, GENITAL - Abnormal; Notable for the following components:      Result Value   WBC, Wet Prep HPF POC FEW (*)    All other components within normal limits  URINALYSIS, ROUTINE W REFLEX MICROSCOPIC - Abnormal; Notable for the following components:   Leukocytes, UA TRACE (*)    All other components within normal limits  CBC WITH DIFFERENTIAL/PLATELET - Abnormal; Notable for the following components:   Hemoglobin 11.5 (*)    HCT  35.0 (*)    All other components within normal limits  COMPREHENSIVE METABOLIC PANEL  LIPASE, BLOOD  POC OCCULT BLOOD, ED    EKG None  Radiology No results found.  Procedures Procedures (including critical care time)  Medications Ordered in ED Medications  morphine 4 MG/ML injection 4 mg (has no administration in time range)  ondansetron (ZOFRAN) injection 4 mg (has no administration in time range)  sodium chloride 0.9 % bolus 1,000 mL (has no administration in time range)     Initial Impression / Assessment and Plan / ED Course  I have reviewed the triage vital signs and the nursing notes.  Pertinent labs & imaging results that were available during my care of the patient were reviewed by me and considered in my medical decision making (see chart for details).  Patient presents to the emergency department for evaluation of lower abdominal pain and rectal pain for the last week, history of ulcerative colitis, not currently on any suppressive medications for this, she is followed by Dr. Melony Overly with GI.  Patient also reports some urinary symptoms and vulvar irritation intermittently over the past month.  Exam not consistent with yeast, and wet prep is clear.  UA without evidence of infection.  Patient denies fevers or chills, she has had some mild nausea but no vomiting.  Abdomen tender with diffuse guarding across the lower abdomen.  Will get abdominal labs, Hemoccult is negative.  Will get acute abdominal  series as well as CT abdomen pelvis, patient has had steadily increasing creatinine over the past several months, 3 weeks ago this was 2.2, so IV contrast is not an option.  Will give fluid bolus, morphine and Zofran for symptomatic relief.  No leukocytosis, hemoglobin of 11.5, patient does have history of chronic anemia, most recent value was 12.5 about 3 weeks ago.  Wet prep without any evidence of use just a few WBCs, urine with trace leukocytes, not concerning for infection.  CMP is pending as well as lipase.  Awaiting acute abdominal series and CT abdomen pelvis.  At shift change care signed out to Dr. Wyvonnia Dusky, who has seen and evaluated the patient as well, will follow up on imaging studies and disposition appropriately.  Final Clinical Impressions(s) / ED Diagnoses   Final diagnoses:  Lower abdominal pain  Rectal pain  Urinary frequency    ED Discharge Orders    None       Jacqlyn Larsen, [place] 09/04/17 0047    Ezequiel Essex, MD 09/04/17 [phone]

## 2017-09-04 ENCOUNTER — Emergency Department (HOSPITAL_COMMUNITY): Payer: Medicare Other

## 2017-09-04 DIAGNOSIS — R109 Unspecified abdominal pain: Secondary | ICD-10-CM | POA: Diagnosis not present

## 2017-09-04 DIAGNOSIS — K5730 Diverticulosis of large intestine without perforation or abscess without bleeding: Secondary | ICD-10-CM | POA: Diagnosis not present

## 2017-09-04 LAB — COMPREHENSIVE METABOLIC PANEL
ALT: 20 U/L (ref 14–54)
AST: 26 U/L (ref 15–41)
Albumin: 3.9 g/dL (ref 3.5–5.0)
Alkaline Phosphatase: 46 U/L (ref 38–126)
BUN: 24 mg/dL — ABNORMAL HIGH (ref 6–20)
Calcium: 8.5 mg/dL — ABNORMAL LOW (ref 8.9–10.3)
Chloride: 104 mmol/L (ref 101–111)
Creatinine, Ser: 1.76 mg/dL — ABNORMAL HIGH (ref 0.44–1.00)
GFR calc Af Amer: 31 mL/min — ABNORMAL LOW (ref >60)
GFR, EST NON AFRICAN AMERICAN: 27 mL/min — AB (ref >60)
Glucose, Bld: 112 mg/dL — ABNORMAL HIGH (ref 65–99)
Potassium: 3.8 mmol/L (ref 3.5–5.1)
Sodium: 133 mmol/L — ABNORMAL LOW (ref 135–145)
Total Bilirubin: 0.6 mg/dL (ref 0.3–1.2)
Total Protein: 6.7 g/dL (ref 6.5–8.1)

## 2017-09-04 LAB — WET PREP, GENITAL
Clue Cells Wet Prep HPF POC: NONE SEEN
SPERM: NONE SEEN
TRICH WET PREP: NONE SEEN
YEAST WET PREP: NONE SEEN

## 2017-09-04 LAB — LIPASE, BLOOD: Lipase: 29 U/L (ref 11–51)

## 2017-09-04 MED ORDER — MORPHINE SULFATE (PF) 4 MG/ML IV SOLN
4.0000 mg | Freq: Once | INTRAVENOUS | Status: AC
  Administered 2017-09-04: 4 mg via INTRAVENOUS
  Filled 2017-09-04: qty 1

## 2017-09-04 MED ORDER — IOPAMIDOL (ISOVUE-300) INJECTION 61%
30.0000 mL | Freq: Once | INTRAVENOUS | Status: AC | PRN
  Administered 2017-09-04: 30 mL via ORAL

## 2017-09-04 MED ORDER — MAGNESIUM CITRATE PO SOLN: 1.0000 | Freq: Once | ORAL | 0 refills | Status: AC

## 2017-09-04 MED ORDER — ONDANSETRON HCL 4 MG/2ML IJ SOLN
4.0000 mg | Freq: Once | INTRAMUSCULAR | Status: AC
  Administered 2017-09-04: 4 mg via INTRAVENOUS
  Filled 2017-09-04: qty 2

## 2017-09-04 MED ORDER — POLYETHYLENE GLYCOL 3350 17 G PO PACK
17.0000 g | PACK | Freq: Every day | ORAL | 0 refills | Status: DC
Start: 2017-09-04 — End: 2018-08-22

## 2017-09-04 MED ORDER — SODIUM CHLORIDE 0.9 % IV BOLUS
1000.0000 mL | Freq: Once | INTRAVENOUS | Status: AC
Start: 2017-09-04 — End: 2017-09-04
  Administered 2017-09-04: 1000 mL via INTRAVENOUS

## 2017-09-04 NOTE — Discharge Instructions (Addendum)
Take the constipation medication as prescribed.  Follow-up with Dr. [name] regarding your abdominal pain as well as the thickening around her anus seen on CT scan.  Return to the ED if you develop worsening pain, fever, vomiting or other concerns.

## 2017-09-04 NOTE — ED Provider Notes (Signed)
Assumed care in signout.  Patient with history of ulcerative colitis on no chronic medications presenting with 1 week history of lower abdominal pain and rectal pain.  Last bowel movement was yesterday that was nonbloody.  Nausea but no vomiting.  No fever.  Intermittently having urinary frequency and urgency.  Patient is in no distress. Abdomen is soft, diffuse lower abdominal tenderness with voluntary guarding Chaperone present for rectal exam.  There is no obvious hemorrhoids.  There is no palpable abscess.  CT scan results discussed with patient.  Will treat constipation.  There is no evidence of acute ulcerative colitis flare at this time.  Thickening of anal skin noted but none appreciated on visual inspection. Follow-up with her gastroenterologist.  Her creatinine is elevated but improved from previous and she is hydrated in the ED. Return precautions discussed.  BP 132/75 (BP Location: Left Arm)   Pulse 75   Temp 98 F (36.7 C) (Oral)   Resp 19   Ht 5' 8"  (1.727 m)   Wt 70.3 kg (155 lb)   SpO2 99%   BMI 23.57 kg/m     Ezequiel Essex, MD 09/04/17 [phone]

## 2017-09-05 ENCOUNTER — Emergency Department (HOSPITAL_COMMUNITY)
Admission: EM | Admit: 2017-09-05 | Discharge: 2017-09-05 | Disposition: A | Discharge status: Home / Self Care | Payer: Medicare Other | Attending: Emergency Medicine | Admitting: Emergency Medicine

## 2017-09-05 ENCOUNTER — Encounter (HOSPITAL_COMMUNITY): Payer: Self-pay

## 2017-09-05 DIAGNOSIS — N183 Chronic kidney disease, stage 3 (moderate): Secondary | ICD-10-CM | POA: Insufficient documentation

## 2017-09-05 DIAGNOSIS — Z79899 Other long term (current) drug therapy: Secondary | ICD-10-CM | POA: Insufficient documentation

## 2017-09-05 DIAGNOSIS — K5180 Other ulcerative colitis without complications: Secondary | ICD-10-CM | POA: Insufficient documentation

## 2017-09-05 DIAGNOSIS — R1032 Left lower quadrant pain: Secondary | ICD-10-CM | POA: Diagnosis not present

## 2017-09-05 DIAGNOSIS — I129 Hypertensive chronic kidney disease with stage 1 through stage 4 chronic kidney disease, or unspecified chronic kidney disease: Secondary | ICD-10-CM | POA: Insufficient documentation

## 2017-09-05 DIAGNOSIS — Z8719 Personal history of other diseases of the digestive system: Secondary | ICD-10-CM

## 2017-09-05 DIAGNOSIS — R197 Diarrhea, unspecified: Secondary | ICD-10-CM | POA: Insufficient documentation

## 2017-09-05 DIAGNOSIS — R1031 Right lower quadrant pain: Secondary | ICD-10-CM | POA: Diagnosis not present

## 2017-09-05 DIAGNOSIS — R110 Nausea: Secondary | ICD-10-CM | POA: Diagnosis not present

## 2017-09-05 LAB — CBC WITH DIFFERENTIAL/PLATELET
Basophils Absolute: 0 10*3/uL (ref 0.0–0.1)
Basophils Relative: 0 %
EOS PCT: 0 %
Eosinophils Absolute: 0 10*3/uL (ref 0.0–0.7)
HCT: 34.6 % — ABNORMAL LOW (ref 36.0–46.0)
Hemoglobin: 11.2 g/dL — ABNORMAL LOW (ref 12.0–15.0)
LYMPHS ABS: 1 10*3/uL (ref 0.7–4.0)
Lymphocytes Relative: 10 %
MCH: 29.2 pg (ref 26.0–34.0)
MCHC: 32.4 g/dL (ref 30.0–36.0)
MCV: 90.1 fL (ref 78.0–100.0)
MONO ABS: 0.8 10*3/uL (ref 0.1–1.0)
MONOS PCT: 8 %
Neutro Abs: 8.8 10*3/uL — ABNORMAL HIGH (ref 1.7–7.7)
Neutrophils Relative %: 82 %
PLATELETS: 309 10*3/uL (ref 150–400)
RBC: 3.84 MIL/uL — AB (ref 3.87–5.11)
RDW: 13.5 % (ref 11.5–15.5)
WBC: 10.7 10*3/uL — ABNORMAL HIGH (ref 4.0–10.5)

## 2017-09-05 LAB — BASIC METABOLIC PANEL
Anion gap: 7 (ref 5–15)
BUN: 18 mg/dL (ref 6–20)
CHLORIDE: 99 mmol/L — AB (ref 101–111)
CO2: 27 mmol/L (ref 22–32)
CREATININE: 1.99 mg/dL — AB (ref 0.44–1.00)
Calcium: 8.8 mg/dL — ABNORMAL LOW (ref 8.9–10.3)
GFR calc Af Amer: 27 mL/min — ABNORMAL LOW (ref >60)
GFR calc non Af Amer: 23 mL/min — ABNORMAL LOW (ref >60)
GLUCOSE: 134 mg/dL — AB (ref 65–99)
POTASSIUM: 3.7 mmol/L (ref 3.5–5.1)
Sodium: 133 mmol/L — ABNORMAL LOW (ref 135–145)

## 2017-09-05 LAB — POC OCCULT BLOOD, ED: Fecal Occult Bld: NEGATIVE

## 2017-09-05 MED ORDER — PROMETHAZINE HCL 12.5 MG PO TABS: 12.5000 mg | ORAL_TABLET | Freq: Four times a day (QID) | ORAL | 0 refills | Status: DC | PRN

## 2017-09-05 MED ORDER — SODIUM CHLORIDE 0.9 % IV SOLN
1000.0000 mL | INTRAVENOUS | Status: DC
  Administered 2017-09-05: 1000 mL via INTRAVENOUS

## 2017-09-05 MED ORDER — SODIUM CHLORIDE 0.9 % IV BOLUS (SEPSIS)
1000.0000 mL | Freq: Once | INTRAVENOUS | Status: AC
  Administered 2017-09-05: 1000 mL via INTRAVENOUS

## 2017-09-05 MED ORDER — FENTANYL CITRATE (PF) 100 MCG/2ML IJ SOLN
50.0000 ug | Freq: Once | INTRAMUSCULAR | Status: AC
  Administered 2017-09-05: 50 ug via INTRAVENOUS
  Filled 2017-09-05: qty 2

## 2017-09-05 MED ORDER — PROCHLORPERAZINE EDISYLATE 10 MG/2ML IJ SOLN
5.0000 mg | Freq: Once | INTRAMUSCULAR | Status: AC
  Administered 2017-09-05: 5 mg via INTRAVENOUS
  Filled 2017-09-05: qty 2

## 2017-09-05 NOTE — ED Provider Notes (Signed)
[facility] EMERGENCY DEPARTMENT Provider Note   CSN: [id number] Arrival date & time: 09/05/17  3845     History   Chief Complaint Chief Complaint  Patient presents with  . Diarrhea    HPI [name] is a 77 y.o. female.  Patient is a 77 year old female who presents to the emergency department with a complaint of diarrhea.  Patient has a history of constipation recently diagnosed.  Diverticular disease, irritable bowel syndrome, colitis, fibromyalgia, chronic kidney disease, and anxiety.  The patient was seen in the emergency department the night of June 14 and into the morning of June 15.  She was found to have evidence of constipation on CT scan.  She came to the emergency department because of abdominal pain, concern for kidney disease, and urine frequency.  She also reports that she had a decrease in her appetite, and seemed to have been passing only small amounts of hard stool.  She is been using MiraLAX, and citrate of magnesia.  And now she states she has had 15 or more loose watery stools.  She says some of them are black and some of them she thinks she is seen some blood in.  She continues to have pain of her lower abdomen.  She says that it seems to be worse because now she is having cramping in her abdomen.  She has nausea, but no actual vomiting.  She denies fever, but states she feels as though she has had some chills.  She presents now for assistance with this issue.    The history is provided by the patient.    Past Medical History:  Diagnosis Date  . Anemia due to blood loss 05/02/2011  . Anxiety   . Bipolar disorder ([place])   . Collagen vascular disease ([place])    ra  . Fibromyalgia   . Hyperglycemia, drug-induced 05/02/2011  . Hypertension   . Ulcerative colitis     Patient Active Problem List   Diagnosis Date Noted  . CKD (chronic kidney disease) stage 3, GFR 30-59 ml/min (HCC) 11/07/2015  . Hyperlipidemia with target LDL less than 100 01/24/2015  . Insomnia  01/24/2015  . Metabolic syndrome 36/46/8032  . Bradycardia 05/03/2011  . Fibromyalgia 11/04/2010  . Depression 11/04/2010  . Essential hypertension 03/08/2007  . ULCERATIVE COLITIS, LEFT SIDED 03/08/2007    Past Surgical History:  Procedure Laterality Date  . ABDOMINAL HYSTERECTOMY    . BIOPSY N/A 12/28/2014   Procedure: SIGMOID AND RECTAL BIOPSIES;  Surgeon: Rogene Houston, MD;  Location: [facility];  Service: Endoscopy;  Laterality: N/A;  . CHOLECYSTECTOMY     s/p  . COLONOSCOPY  06/16/06. 04/27/2007   proctitis seen, biopsies showed chronic active colitis, no dysplasia   . FLEXIBLE SIGMOIDOSCOPY N/A 12/28/2014   Procedure: FLEXIBLE SIGMOIDOSCOPY WITH PROPOFOL;  Surgeon: Rogene Houston, MD;  Location: [facility];  Service: Endoscopy;  Laterality: N/A;     OB History   None      Home Medications    Prior to Admission medications   Medication Sig Start Date End Date Taking? Authorizing Provider  ALPRAZolam Duanne Moron) 0.5 MG tablet Take 1 Tablet by mouth 2 times a day as needed FOR ANXIETY Patient taking differently: Take 0.5 mg by mouth 2 (two) times daily as needed for anxiety.  08/11/17   Hassell Done Mary-Margaret, FNP  antiseptic oral rinse (BIOTENE) LIQD 1 application by Mouth Rinse route daily. FOR DRY MOUTH    [provider]  B Complex CAPS Take  1 capsule by mouth every morning.     [provider]  benazepril-hydrochlorthiazide (LOTENSIN HCT) 20-12.5 MG tablet TAKE (1) TABLET BY MOUTH ONCE DAILY. 08/11/17   Hassell Done Mary-Margaret, FNP  Cholecalciferol (VITAMIN D3) 2000 units TABS Take 1 capsule by mouth daily. Patient states that she alternates with Hair, Skin, and Nails.    [provider]  co-enzyme Q-10 50 MG capsule Take 50 mg by mouth once a week.     [provider]  dicyclomine (BENTYL) 20 MG tablet TAKE 1/2 TABLET TWICE DAILY BEFORE A MEAL. Patient taking differently: Take 10 mg by mouth 2 (two) times daily.  08/11/17   Hassell Done, Mary-Margaret,  FNP  fenofibrate (TRICOR) 145 MG tablet Take 1 tablet (145 mg total) by mouth daily. 05/11/17   Hassell Done, Mary-Margaret, FNP  hydrocortisone (ANUSOL-HC) 2.5 % rectal cream Place 1 application rectally 2 (two) times daily. 04/05/17   Rogene Houston, MD  Multiple Vitamin (THERA) TABS Take 1 tablet by mouth daily.     [provider]  Multiple Vitamins-Minerals (WOMENS MULTI VITAMIN & MINERAL PO) Take 1 tablet by mouth every other day.     [provider]  Omega-3 Fatty Acids (FISH OIL) 1000 MG CPDR Take 3 capsules by mouth. Patient takes 1 in the morning , 1 in the afternoon, and 1 at night.    [provider]  ondansetron (ZOFRAN) 4 MG tablet TAKE 1 TABLET TWICE DAILY AS NEEDED FOR NAUSEA AND VOMITING. Patient taking differently: Take 2-4 mg by mouth 2 (two) times daily as needed for nausea.  04/05/17   Rehman, Mechele Dawley, MD  OVER THE COUNTER MEDICATION Take 1 tablet by mouth daily. Vitamin K -2 Patient states that she takes daily with her Vitamin D.     [provider]  oxyCODONE (OXY IR/ROXICODONE) 5 MG immediate release tablet Take 1 tablet (5 mg total) by mouth 2 (two) times daily. Patient not taking: Reported on 09/03/2017 10/10/17 11/09/17  [name], FNP  oxyCODONE (OXY IR/ROXICODONE) 5 MG immediate release tablet Take 1 tablet (5 mg total) by mouth 2 (two) times daily. 09/11/17 10/11/17  Hassell Done, Mary-Margaret, FNP  oxyCODONE (ROXICODONE) 5 MG immediate release tablet Take 1 tablet (5 mg total) by mouth 2 (two) times daily. Patient not taking: Reported on 09/03/2017 10/11/17 11/10/17  [name], FNP  polyethylene glycol [facility]) packet Take 17 g by mouth daily. 09/04/17   Rancour, Annie Main, MD  Probiotic Product (PROBIOTIC FORMULA PO) Take 1-2 capsules by mouth every morning.     [provider]  QUEtiapine (SEROQUEL) 100 MG tablet Take 1 tablet (100 mg total) by mouth at bedtime. 08/11/17   [name], FNP  shark liver  oil-cocoa butter (PREPARATION H) 0.25-3-85.5 % suppository Place 1 suppository rectally as needed for hemorrhoids.    [provider]  vitamin A 10000 UNIT capsule Take 10,000 Units by mouth once a week.     [provider]  vitamin C (ASCORBIC ACID) 500 MG tablet Take 500 mg by mouth once a week.     [provider]  vitamin E 400 UNIT capsule Take 400 Units by mouth every morning.     [provider]  sodium chloride (OCEAN) 0.65 % SOLN nasal spray Place 2 sprays into the nose as needed for congestion. 05/05/11 06/07/11  Delfina Redwood, MD    Family History Family History  Problem Relation Age of Onset  . Heart attack Mother 53  Died in her sleep  . Diabetes Mother   . Heart attack Father 45       Died in her sleep  . Diabetes Father   . Heart attack Brother 66       Defib, CABG  . Diabetes Brother   . Cancer Sister     Social History Social History   Tobacco Use  . Smoking status: Never Smoker  . Smokeless tobacco: Never Used  Substance Use Topics  . Alcohol use: Yes    Alcohol/week: 0.0 oz    Comment: Ocassionally a small glass of wine. Patient states that is has been 2 months since she drank anything.  . Drug use: No     Allergies   Azathioprine; Tape; Codeine; Lac bovis; Penicillins; Povidone-iodine; Procaine hcl; Sulfonamide derivatives; Tramadol; Acyclovir and related; Azithromycin; Clonazepam; Dairy aid [lactase]; Doxycycline; Latex; and Niacin and related   Review of Systems Review of Systems  Constitutional: Positive for chills. Negative for activity change.       All ROS Neg except as noted in HPI  HENT: Negative for nosebleeds.   Eyes: Negative for photophobia and discharge.  Respiratory: Negative for cough, shortness of breath and wheezing.   Cardiovascular: Negative for chest pain and palpitations.  Gastrointestinal: Positive for abdominal pain, blood in stool, diarrhea and nausea.  Genitourinary: Negative for  dysuria, frequency and hematuria.  Musculoskeletal: Negative for arthralgias, back pain and neck pain.  Skin: Negative.   Neurological: Positive for weakness. Negative for dizziness, seizures and speech difficulty.  Psychiatric/Behavioral: Negative for confusion and hallucinations.     Physical Exam Updated Vital Signs BP (!) 150/70 (BP Location: Left Arm)   Pulse (!) 101   Temp 98.1 F (36.7 C) (Oral)   Resp 16   Wt 70.3 kg (155 lb)   SpO2 96%   BMI 23.57 kg/m   Physical Exam  Constitutional: She is oriented to person, place, and time. She appears well-developed and well-nourished.  Non-toxic appearance.  HENT:  Head: Normocephalic.  Right Ear: Tympanic membrane and external ear normal.  Left Ear: Tympanic membrane and external ear normal.  Eyes: Pupils are equal, round, and reactive to light. EOM and lids are normal.  Neck: Normal range of motion. Neck supple. Carotid bruit is not present.  Cardiovascular: Regular rhythm, normal heart sounds, intact distal pulses and normal pulses. Tachycardia present.  Pulmonary/Chest: Breath sounds normal. No respiratory distress.  Abdominal: Soft. Normal appearance. She exhibits no distension, no abdominal bruit, no pulsatile midline mass and no mass. Bowel sounds are increased. There is no splenomegaly or hepatomegaly. There is tenderness in the right lower quadrant and left lower quadrant. There is guarding.  Musculoskeletal: Normal range of motion.  Lymphadenopathy:       Head (right side): No submandibular adenopathy present.       Head (left side): No submandibular adenopathy present.    She has no cervical adenopathy.  Neurological: She is alert and oriented to person, place, and time. She has normal strength. No cranial nerve deficit or sensory deficit.  Skin: Skin is warm and dry.  Psychiatric: She has a normal mood and affect. Her speech is normal.  Nursing note and vitals reviewed.    ED Treatments / Results  Labs (all labs  ordered are listed, but only abnormal results are displayed) Labs Reviewed  BASIC METABOLIC PANEL    EKG None  Radiology Ct Abdomen Pelvis Wo Contrast  Result Date: 09/04/2017 CLINICAL DATA:  Abdominal pain rectal pain EXAM:  CT ABDOMEN AND PELVIS WITHOUT CONTRAST TECHNIQUE: Multidetector CT imaging of the abdomen and pelvis was performed following the standard protocol without IV contrast. COMPARISON:  09/04/2017 radiograph, CT 03/20/2016 FINDINGS: Lower chest: Lung bases demonstrate no acute consolidation or pleural effusion. Heart size within normal limits. Hepatobiliary: Subcentimeter hypodensity in the dome of the liver, too small to further characterize but unchanged. Status post cholecystectomy. Stable enlarged extrahepatic bile duct Pancreas: Unremarkable. No pancreatic ductal dilatation or surrounding inflammatory changes. Spleen: Normal in size without focal abnormality. Adrenals/Urinary Tract: Adrenal glands are within normal limits. Prominent renal pelvises without frank hydronephrosis. The bladder is normal Stomach/Bowel: Stomach is nonenlarged. No dilated small bowel. Large volume of stool throughout the colon. Diffuse colon diverticular disease without acute inflammation. Mild partially visualized perianal soft tissue thickening. Vascular/Lymphatic: Mild aortic atherosclerosis. No aneurysmal dilatation. Small nodes are nodules in the perirectal fat, for example 8 mm right perirectal nodule, series 2, image number 79. Reproductive: Status post hysterectomy. No adnexal masses. Other: Negative for free air or free fluid. Musculoskeletal: Chronic superior endplate deformity at L3. IMPRESSION: 1. Large volume of stool within the colon, suggesting constipation. No bowel obstruction is visualized. 2. Partially visualized soft tissue thickening at the anus, recommend correlation with direct inspection to exclude mass in the region. 3. Diffuse colon diverticular disease without acute inflammation 4.  Other chronic findings as previously described Electronically Signed   By: Donavan Foil M.D.   On: 09/04/2017 02:42   Dg Abdomen Acute W/chest  Result Date: 09/04/2017 CLINICAL DATA:  Abdominal pain. Patient reports vaginal and rectal pain. EXAM: DG ABDOMEN ACUTE W/ 1V CHEST COMPARISON:  Chest radiograph 07/28/2017, abdominal CT 03/20/2016 FINDINGS: The cardiomediastinal contours are normal, similar upper normal heart size. The lungs are clear. There is no free intra-abdominal air. No dilated bowel loops to suggest obstruction. Moderate volume of stool throughout the colon. Cholecystectomy clips in the right upper quadrant. No radiopaque calculi. There are pelvic phleboliths. No acute osseous abnormalities are seen. Scoliotic curvature of the spine. IMPRESSION: Unremarkable radiographs of the chest and abdomen. Electronically Signed   By: Jeb Levering M.D.   On: 09/04/2017 01:04    Procedures Procedures (including critical care time)  Medications Ordered in ED Medications - No data to display   Initial Impression / Assessment and Plan / ED Course  I have reviewed the triage vital signs and the nursing notes.  Pertinent labs & imaging results that were available during my care of the patient were reviewed by me and considered in my medical decision making (see chart for details).       Final Clinical Impressions(s) / ED Diagnoses MDM  Patient reports pain 8 or 9 on a scale of 1-10.  Intravenous pain medication and intravenous nausea/spasm medication given to the patient.  Patient had a bout with diarrhea, stool was checked and negative for blood.  Pain improved significantly after medication.  Sodium is slightly low at 133, chloride low at 99.  This is similar to the patient's previous testing.  The creatinine is slightly elevated at 1.99.  The previous creatinine was 1.78.  Anion gap is normal at 7.  Complete blood count shows the white blood cells to be slightly elevated at  10.7, the hematocrit and hemoglobin remained stable compared to the previous hemoglobin and hematocrit. Patient ambulatory to the bathroom without problem.  She states her pain remains on can significantly controlled. Case discussed with Dr Wilson Singer.  I reviewed the exam findings and the lab findings  with the patient in terms which she understands, as well as her family.  Prescription for promethazine 12.5 mg given to the patient to use for nausea, as well as to help with spasm discomfort.  The patient is scheduled to call gastroenterology on tomorrow to set up an appointment.  And I have emphasized to her the importance of following up with this.  The patient will stop all laxative related medication for now.  She will return to the emergency department if any changes in her condition, problems, or concerns.  Patient is in agreement with this plan.   Final diagnoses:  Diarrhea, unspecified type  History of colitis    ED Discharge Orders        Ordered    promethazine (PHENERGAN) 12.5 MG tablet  Every 6 hours PRN     09/05/17 1107       Lily Kocher, PA-C 09/05/17 1131    Virgel Manifold, MD 09/05/17 1341

## 2017-09-05 NOTE — Discharge Instructions (Addendum)
Your vital signs are within normal limits.  Your oxygen level is also within normal limits.  At this time, your stool is negative for hidden blood.  Your hemoglobin and hematocrit remained stable.  Your kidney function continues to be slightly elevated.  Please discuss your lower abdomen pain and diarrhea with your GI specialist as previously scheduled.  Please use promethazine for nausea and for spasm pain in your lower abdomen.  This medication will cause drowsiness, and/or lightheadedness.  Please use extreme caution when up and about after using this medication.  Please keep up with your liquids, since you are having problems with the diarrhea.  Please see your primary physician or return to the emergency department if any changes in your condition, problems, or concerns.

## 2017-09-05 NOTE — ED Triage Notes (Addendum)
Pt reports that she was seen Friday due to being impacted. She took the miralax and citrate of mag yesterday and has had 15 episodes of loose stools and abdominal cramping

## 2017-09-10 ENCOUNTER — Telehealth: Payer: Self-pay | Admitting: Cardiology

## 2017-09-10 NOTE — Telephone Encounter (Signed)
called stating that she wants to cancel Echo did not want to reschedule at this time

## 2017-09-15 ENCOUNTER — Other Ambulatory Visit: Payer: Medicare Other

## 2017-09-16 ENCOUNTER — Ambulatory Visit (INDEPENDENT_AMBULATORY_CARE_PROVIDER_SITE_OTHER): Payer: Medicare Other | Admitting: Internal Medicine

## 2017-09-18 ENCOUNTER — Encounter: Payer: Self-pay | Admitting: Nurse Practitioner

## 2017-09-18 ENCOUNTER — Ambulatory Visit: Payer: Medicare Other | Admitting: Nurse Practitioner

## 2017-09-18 VITALS — BP 135/75 | HR 88 | Temp 97.5°F | Ht 68.0 in | Wt 151.6 lb

## 2017-09-18 DIAGNOSIS — J0100 Acute maxillary sinusitis, unspecified: Secondary | ICD-10-CM

## 2017-09-18 MED ORDER — BENZONATATE 100 MG PO CAPS: 100.0000 mg | ORAL_CAPSULE | Freq: Three times a day (TID) | ORAL | 0 refills | Status: DC | PRN

## 2017-09-18 MED ORDER — CEFDINIR 300 MG PO CAPS: 300.0000 mg | ORAL_CAPSULE | Freq: Two times a day (BID) | ORAL | 0 refills | Status: DC

## 2017-09-18 NOTE — Progress Notes (Signed)
Subjective:    Patient ID: [name], female    DOB: 02/26/1941, 77 y.o.   MRN: [id number]   Chief Complaint: Nasal Congestion (x 2 weeks. OTC mucinex); Cough; Sore Throat; and Headache   HPI Patient come sin c/o congestion for greater then 2 weeks. Lots of facial pressure with occasional headache. She has tried chloracidin and mucinex with no relief.   Review of Systems  Constitutional: Positive for appetite change (decreases) and fever (yesterday). Negative for chills.  HENT: Positive for congestion, postnasal drip, rhinorrhea and sinus pressure. Negative for ear pain, sore throat and trouble swallowing.   Eyes: Negative.   Respiratory: Positive for cough (productive that has turned dark yelow.).   Cardiovascular: Negative.   Gastrointestinal: Negative.   Neurological: Positive for headaches.  Psychiatric/Behavioral: Negative.   All other systems reviewed and are negative.      Objective:   Physical Exam  Constitutional: She is oriented to person, place, and time. She appears well-developed and well-nourished.  HENT:  Head: Normocephalic.  Right Ear: Hearing, tympanic membrane and ear canal normal.  Left Ear: Hearing, tympanic membrane and ear canal normal.  Nose: Mucosal edema and rhinorrhea present. Right sinus exhibits maxillary sinus tenderness. Left sinus exhibits maxillary sinus tenderness.  Mouth/Throat: Uvula is midline, oropharynx is clear and moist and mucous membranes are normal.  Eyes: Pupils are equal, round, and reactive to light. EOM are normal.  Neck: Normal range of motion. Neck supple.  Cardiovascular: Normal rate and regular rhythm.  Pulmonary/Chest: Effort normal and breath sounds normal. No respiratory distress. She has no wheezes. She has no rhonchi. She has no rales.  Abdominal: Soft. Bowel sounds are normal.  Neurological: She is alert and oriented to person, place, and time.  Skin: Skin is warm. Capillary refill takes less than 2 seconds.    Psychiatric: She has a normal mood and affect. Her behavior is normal.  Nursing note and vitals reviewed.  BP 135/75   Pulse 88   Temp (!) 97.5 F (36.4 C) (Oral)   Ht 5' 8"  (1.727 m)   Wt 151 lb 9.6 oz (68.8 kg)   SpO2 97%   BMI 23.05 kg/m         Assessment & Plan:  [name] in today with chief complaint of Nasal Congestion (x 2 weeks. OTC mucinex); Cough; Sore Throat; and Headache   1. Acute maxillary sinusitis, recurrence not specified 1. Take meds as prescribed 2. Use a cool mist humidifier especially during the winter months and when heat has been humid. 3. Use saline nose sprays frequently 4. Saline irrigations of the nose can be very helpful if done frequently.  * 4X daily for 1 week*  * Use of a nettie pot can be helpful with this. Follow directions with this* 5. Drink plenty of fluids 6. Keep thermostat turn down low 7.For any cough or congestion  Use plain Mucinex- regular strength or max strength is fine   * Children- consult with Pharmacist for dosing 8. For fever or aces or pains- take tylenol or ibuprofen appropriate for age and weight.  * for fevers greater than 101 orally you may alternate ibuprofen and tylenol every  3 hours.    - cefdinir (OMNICEF) 300 MG capsule; Take 1 capsule (300 mg total) by mouth 2 (two) times daily. 1 po BID  Dispense: 20 capsule; Refill: 0 - benzonatate (TESSALON PERLES) 100 MG capsule; Take 1 capsule (100 mg total) by mouth 3 (three) times daily as needed  for cough.  Dispense: 20 capsule; Refill: 0  Mary-Margaret Hassell Done, FNP

## 2017-09-18 NOTE — Patient Instructions (Signed)

## 2017-09-27 ENCOUNTER — Telehealth: Payer: Self-pay | Admitting: Nurse Practitioner

## 2017-09-27 NOTE — Telephone Encounter (Signed)
Is she using flonase daily- should not need another antibiotic

## 2017-09-28 NOTE — Telephone Encounter (Signed)
Patient aware of recommendations.  

## 2017-09-28 NOTE — Telephone Encounter (Signed)
Yes, she has been using the Flonase daily

## 2017-09-28 NOTE — Telephone Encounter (Signed)
Duplicate, see other encounter

## 2017-09-28 NOTE — Telephone Encounter (Signed)
Add daily claritin- should not need another antibiotis

## 2017-10-05 ENCOUNTER — Encounter: Payer: Self-pay | Admitting: Family

## 2017-10-05 ENCOUNTER — Telehealth: Payer: Self-pay | Admitting: Nurse Practitioner

## 2017-10-05 ENCOUNTER — Ambulatory Visit (INDEPENDENT_AMBULATORY_CARE_PROVIDER_SITE_OTHER): Payer: Medicare Other | Admitting: Family

## 2017-10-05 ENCOUNTER — Ambulatory Visit (INDEPENDENT_AMBULATORY_CARE_PROVIDER_SITE_OTHER): Payer: Medicare Other

## 2017-10-05 VITALS — BP 134/79 | HR 107 | Temp 97.0°F | Resp 22 | Ht 68.0 in

## 2017-10-05 DIAGNOSIS — R0600 Dyspnea, unspecified: Secondary | ICD-10-CM

## 2017-10-05 DIAGNOSIS — R0989 Other specified symptoms and signs involving the circulatory and respiratory systems: Secondary | ICD-10-CM | POA: Diagnosis not present

## 2017-10-05 DIAGNOSIS — R0602 Shortness of breath: Secondary | ICD-10-CM | POA: Diagnosis not present

## 2017-10-05 DIAGNOSIS — J189 Pneumonia, unspecified organism: Secondary | ICD-10-CM | POA: Diagnosis not present

## 2017-10-05 MED ORDER — LEVOFLOXACIN 500 MG PO TABS: 500.0000 mg | ORAL_TABLET | ORAL | 0 refills | Status: DC

## 2017-10-05 NOTE — Telephone Encounter (Signed)
Patient of [facility]. Omnicef was given on 6/29. Please advise and route to [facility]

## 2017-10-05 NOTE — Telephone Encounter (Signed)
Send clindamycin 150 mg #30 one tab po TID 10 days, no refills

## 2017-10-05 NOTE — Progress Notes (Signed)
Subjective:    Patient ID: [name], female    DOB: 21-Jan-1941, 77 y.o.   MRN: [id number]  Chief Complaint  Patient presents with  . Shortness of Breath  . Ear Pain  . Nasal Congestion   Pt presents to the office today for SOB and cough that has worsen over the last 4 weeks. States she was seen in the office on 09/18/17 and treated for sinusitis with Omnicef. States she felt slightly better, but now feels worse.  Shortness of Breath  Associated symptoms include ear pain, headaches, rhinorrhea and a sore throat.  Cough  This is a recurrent problem. The current episode started 1 to 4 weeks ago. The problem has been gradually worsening. The problem occurs every few minutes. The cough is productive of sputum. Associated symptoms include chills, ear congestion, ear pain, headaches, myalgias, nasal congestion, postnasal drip, rhinorrhea, a sore throat, shortness of breath and sweats. The symptoms are aggravated by lying down. She has tried rest and OTC cough suppressant (cefdinir) for the symptoms. The treatment provided mild relief.      Review of Systems  Constitutional: Positive for chills.  HENT: Positive for ear pain, postnasal drip, rhinorrhea and sore throat.   Respiratory: Positive for cough and shortness of breath.   Musculoskeletal: Positive for myalgias.  Neurological: Positive for headaches.  All other systems reviewed and are negative.      Objective:   Physical Exam  Constitutional: She is oriented to person, place, and time. She appears well-developed and well-nourished. No distress.  HENT:  Head: Normocephalic and atraumatic.  Right Ear: External ear normal.  Nose: Mucosal edema and rhinorrhea present.  Mouth/Throat: Posterior oropharyngeal erythema present.  Eyes: Pupils are equal, round, and reactive to light.  Neck: Normal range of motion. Neck supple. No thyromegaly present.  Cardiovascular: Normal rate, regular rhythm, normal heart sounds and intact distal  pulses.  No murmur heard. Pulmonary/Chest: Effort normal and breath sounds normal. No respiratory distress. She has no wheezes.  Abdominal: Soft. Bowel sounds are normal. She exhibits no distension. There is no tenderness.  Musculoskeletal: Normal range of motion. She exhibits no edema or tenderness.  Neurological: She is alert and oriented to person, place, and time. She has normal reflexes. No cranial nerve deficit.  Skin: Skin is warm and dry.  Psychiatric: She has a normal mood and affect. Her behavior is normal. Judgment and thought content normal.  Vitals reviewed.    BP 134/79   Pulse (!) 107   Temp (!) 97 F (36.1 C) (Oral)   Resp (!) 22   Ht 5' 8"  (1.727 m)   SpO2 99%   BMI 23.05 kg/m      Assessment & Plan:  Emilyrose was seen today for shortness of breath, ear pain and nasal congestion.  Diagnoses and all orders for this visit:  Dyspnea, unspecified type -     DG Chest 2 View; Future -     BMP8+EGFR -     CBC with Differential/Platelet  Chest congestion -     DG Chest 2 View; Future -     BMP8+EGFR -     CBC with Differential/Platelet  Community acquired pneumonia, unspecified laterality -     levofloxacin (LEVAQUIN) 500 MG tablet; Take 1 tablet (500 mg total) by mouth every other day. -     BMP8+EGFR -     CBC with Differential/Platelet   Will treat for CAP, Pt's dyspnea improves when sitting in the room and I'm  not watching her. I believe her tachypnea is a lot of anxiety, but long discussion that if SOB worsens to go to ED. Discussed s/s of PE and red flags. Labs pending.    Evelina Dun, FNP

## 2017-10-05 NOTE — Patient Instructions (Signed)
Shortness of Breath, Adult  Shortness of breath means you have trouble breathing. Your lungs are organs for breathing.  Follow these instructions at home:  Pay attention to any changes in your symptoms. Take these actions to help with your condition:  ? Do not smoke. Smoking can cause shortness of breath. If you need help to quit smoking, ask your doctor.  ? Avoid things that can make it harder to breathe, such as:  ? Mold.  ? Dust.  ? Air pollution.  ? Chemical smells.  ? Things that can cause allergy symptoms (allergens), if you have allergies.  ? Keep your living space clean and free of mold and dust.  ? Rest as needed. Slowly return to your usual activities.  ? Take over-the-counter and prescription medicines, including oxygen and inhaled medicines, only as told by your doctor.  ? Keep all follow-up visits as told by your doctor. This is important.  Contact a doctor if:  ? Your condition does not get better as soon as expected.  ? You have a hard time doing your normal activities, even after you rest.  ? You have new symptoms.  Get help right away if:  ? You have trouble breathing when you are resting.  ? You feel light-headed or you faint.  ? You have a cough that is not helped by medicines.  ? You cough up blood.  ? You have pain with breathing.  ? You have pain in your chest, arms, shoulders, or belly (abdomen).  ? You have a fever.  ? You cannot walk up stairs.  ? You cannot exercise the way you normally do.  This information is not intended to replace advice given to you by your health care provider. Make sure you discuss any questions you have with your health care provider.  Document Released: 08/26/2007 Document Revised: 03/26/2016 Document Reviewed: 03/26/2016  Elsevier Interactive Patient Education ? 2017 Elsevier Inc[employer]

## 2017-10-05 NOTE — Telephone Encounter (Signed)
Left detailed message that rx was sent to pharmacy

## 2017-10-06 LAB — BMP8+EGFR
BUN/Creatinine Ratio: 14 (ref 12–28)
BUN: 20 mg/dL (ref 8–27)
CHLORIDE: 90 mmol/L — AB (ref 96–106)
CO2: 23 mmol/L (ref 20–29)
CREATININE: 1.48 mg/dL — AB (ref 0.57–1.00)
Calcium: 9.6 mg/dL (ref 8.7–10.3)
GFR calc Af Amer: 40 mL/min/{1.73_m2} — ABNORMAL LOW (ref >59)
GFR calc non Af Amer: 34 mL/min/{1.73_m2} — ABNORMAL LOW (ref >59)
GLUCOSE: 132 mg/dL — AB (ref 65–99)
POTASSIUM: 4.8 mmol/L (ref 3.5–5.2)
SODIUM: 127 mmol/L — AB (ref 134–144)

## 2017-10-06 LAB — CBC WITH DIFFERENTIAL/PLATELET
BASOS ABS: 0 10*3/uL (ref 0.0–0.2)
Basos: 1 %
EOS (ABSOLUTE): 0.2 10*3/uL (ref 0.0–0.4)
Eos: 2 %
HEMOGLOBIN: 11.7 g/dL (ref 11.1–15.9)
Hematocrit: 36.7 % (ref 34.0–46.6)
IMMATURE GRANS (ABS): 0 10*3/uL (ref 0.0–0.1)
Immature Granulocytes: 0 %
LYMPHS ABS: 1.1 10*3/uL (ref 0.7–3.1)
Lymphs: 16 %
MCH: 28.3 pg (ref 26.6–33.0)
MCHC: 31.9 g/dL (ref 31.5–35.7)
MCV: 89 fL (ref 79–97)
MONOS ABS: 0.6 10*3/uL (ref 0.1–0.9)
Monocytes: 8 %
NEUTROS PCT: 73 %
Neutrophils Absolute: 5.4 10*3/uL (ref 1.4–7.0)
Platelets: 420 10*3/uL (ref 150–450)
RBC: 4.14 x10E6/uL (ref 3.77–5.28)
RDW: 14.6 % (ref 12.3–15.4)
WBC: 7.3 10*3/uL (ref 3.4–10.8)

## 2017-10-12 ENCOUNTER — Ambulatory Visit (INDEPENDENT_AMBULATORY_CARE_PROVIDER_SITE_OTHER): Payer: Medicare Other | Admitting: Internal Medicine

## 2017-10-13 ENCOUNTER — Other Ambulatory Visit: Payer: Self-pay

## 2017-10-13 ENCOUNTER — Emergency Department (HOSPITAL_COMMUNITY)
Admission: EM | Admit: 2017-10-13 | Discharge: 2017-10-13 | Disposition: A | Discharge status: Home / Self Care | Payer: Medicare Other | Attending: Emergency Medicine | Admitting: Emergency Medicine

## 2017-10-13 ENCOUNTER — Encounter (HOSPITAL_COMMUNITY): Payer: Self-pay | Admitting: Emergency Medicine

## 2017-10-13 ENCOUNTER — Emergency Department (HOSPITAL_COMMUNITY): Payer: Medicare Other

## 2017-10-13 DIAGNOSIS — N183 Chronic kidney disease, stage 3 (moderate): Secondary | ICD-10-CM | POA: Diagnosis not present

## 2017-10-13 DIAGNOSIS — I129 Hypertensive chronic kidney disease with stage 1 through stage 4 chronic kidney disease, or unspecified chronic kidney disease: Secondary | ICD-10-CM | POA: Diagnosis not present

## 2017-10-13 DIAGNOSIS — Z79899 Other long term (current) drug therapy: Secondary | ICD-10-CM | POA: Insufficient documentation

## 2017-10-13 DIAGNOSIS — R059 Cough, unspecified: Secondary | ICD-10-CM

## 2017-10-13 DIAGNOSIS — J90 Pleural effusion, not elsewhere classified: Secondary | ICD-10-CM | POA: Diagnosis not present

## 2017-10-13 DIAGNOSIS — R05 Cough: Secondary | ICD-10-CM | POA: Diagnosis not present

## 2017-10-13 DIAGNOSIS — R0602 Shortness of breath: Secondary | ICD-10-CM | POA: Insufficient documentation

## 2017-10-13 LAB — COMPREHENSIVE METABOLIC PANEL
ALBUMIN: 3.8 g/dL (ref 3.5–5.0)
ALT: 24 U/L (ref 0–44)
AST: 27 U/L (ref 15–41)
Alkaline Phosphatase: 51 U/L (ref 38–126)
Anion gap: 8 (ref 5–15)
BUN: 25 mg/dL — AB (ref 8–23)
CHLORIDE: 96 mmol/L — AB (ref 98–111)
CO2: 26 mmol/L (ref 22–32)
Calcium: 9 mg/dL (ref 8.9–10.3)
Creatinine, Ser: 1.63 mg/dL — ABNORMAL HIGH (ref 0.44–1.00)
GFR calc Af Amer: 34 mL/min — ABNORMAL LOW (ref >60)
GFR calc non Af Amer: 30 mL/min — ABNORMAL LOW (ref >60)
GLUCOSE: 120 mg/dL — AB (ref 70–99)
POTASSIUM: 4 mmol/L (ref 3.5–5.1)
Sodium: 130 mmol/L — ABNORMAL LOW (ref 135–145)
Total Bilirubin: 0.7 mg/dL (ref 0.3–1.2)
Total Protein: 7.1 g/dL (ref 6.5–8.1)

## 2017-10-13 LAB — CBC WITH DIFFERENTIAL/PLATELET
BASOS ABS: 0 10*3/uL (ref 0.0–0.1)
Basophils Relative: 1 %
Eosinophils Absolute: 0.1 10*3/uL (ref 0.0–0.7)
Eosinophils Relative: 1 %
HEMATOCRIT: 34.8 % — AB (ref 36.0–46.0)
Hemoglobin: 11.8 g/dL — ABNORMAL LOW (ref 12.0–15.0)
Lymphocytes Relative: 19 %
Lymphs Abs: 1.5 10*3/uL (ref 0.7–4.0)
MCH: 29.9 pg (ref 26.0–34.0)
MCHC: 33.9 g/dL (ref 30.0–36.0)
MCV: 88.3 fL (ref 78.0–100.0)
MONO ABS: 0.7 10*3/uL (ref 0.1–1.0)
MONOS PCT: 9 %
NEUTROS ABS: 5.8 10*3/uL (ref 1.7–7.7)
Neutrophils Relative %: 70 %
Platelets: 378 10*3/uL (ref 150–400)
RBC: 3.94 MIL/uL (ref 3.87–5.11)
RDW: 13.8 % (ref 11.5–15.5)
WBC: 8.2 10*3/uL (ref 4.0–10.5)

## 2017-10-13 LAB — BRAIN NATRIURETIC PEPTIDE: B Natriuretic Peptide: 12 pg/mL (ref 0.0–100.0)

## 2017-10-13 MED ORDER — HYDROCOD POLST-CPM POLST ER 10-8 MG/5ML PO SUER: 5.0000 mL | Freq: Two times a day (BID) | ORAL | 0 refills | Status: DC | PRN

## 2017-10-13 MED ORDER — ALBUTEROL SULFATE HFA 108 (90 BASE) MCG/ACT IN AERS
2.0000 | INHALATION_SPRAY | Freq: Once | RESPIRATORY_TRACT | Status: AC
  Administered 2017-10-13: 2 via RESPIRATORY_TRACT
  Filled 2017-10-13: qty 6.7

## 2017-10-13 NOTE — ED Notes (Signed)
RT called for inhaler.

## 2017-10-13 NOTE — ED Provider Notes (Signed)
[facility] EMERGENCY DEPARTMENT Provider Note   CSN: [id number] Arrival date & time: 10/13/17  1543     History   Chief Complaint Chief Complaint  Patient presents with  . Shortness of Breath    HPI [name] is a 77 y.o. female.  HPI  77 year old female presents with cough.  She states that this is been going on for about 6 weeks.  She has been having yellow sputum.  The yellow sputum is improving and becoming lighter but still present.  She endorses some subjective fevers, most recently about 3 days ago.  She is been on 2 different antibiotics.  At first at the end of June she was prescribed Cefdinir.  She finished this and then was prescribed Levaquin on 7/16 and still has 2 days left.  However she feels like there is no improvement.  She has nasal congestion as well as ear fullness.  She is also having sore throat.  There is no chest pain.  She is been trying other over-the-counter medicine such as Claritin-D and Mucinex.  She is also been on Flonase. Patient also notes some right sided neck soreness starting today. Worse with ROM but no neck stiffness.   Past Medical History:  Diagnosis Date  . Anemia due to blood loss 05/02/2011  . Anxiety   . Bipolar disorder ([place])   . Collagen vascular disease ([place])    ra  . Fibromyalgia   . Hyperglycemia, drug-induced 05/02/2011  . Hypertension   . Ulcerative colitis     Patient Active Problem List   Diagnosis Date Noted  . CKD (chronic kidney disease) stage 3, GFR 30-59 ml/min (HCC) 11/07/2015  . Hyperlipidemia with target LDL less than 100 01/24/2015  . Insomnia 01/24/2015  . Metabolic syndrome 38/93/7342  . Bradycardia 05/03/2011  . Fibromyalgia 11/04/2010  . Depression 11/04/2010  . Essential hypertension 03/08/2007  . ULCERATIVE COLITIS, LEFT SIDED 03/08/2007    Past Surgical History:  Procedure Laterality Date  . ABDOMINAL HYSTERECTOMY    . BIOPSY N/A 12/28/2014   Procedure: SIGMOID AND RECTAL BIOPSIES;  Surgeon:  Rogene Houston, MD;  Location: [facility];  Service: Endoscopy;  Laterality: N/A;  . CHOLECYSTECTOMY     s/p  . COLONOSCOPY  06/16/06. 04/27/2007   proctitis seen, biopsies showed chronic active colitis, no dysplasia   . FLEXIBLE SIGMOIDOSCOPY N/A 12/28/2014   Procedure: FLEXIBLE SIGMOIDOSCOPY WITH PROPOFOL;  Surgeon: Rogene Houston, MD;  Location: [facility];  Service: Endoscopy;  Laterality: N/A;     OB History   None      Home Medications    Prior to Admission medications   Medication Sig Start Date End Date Taking? Authorizing Provider  ALPRAZolam Duanne Moron) 0.5 MG tablet Take 1 Tablet by mouth 2 times a day as needed FOR ANXIETY Patient taking differently: Take 0.5 mg by mouth 2 (two) times daily as needed for anxiety.  08/11/17  Yes Hassell Done, Mary-Margaret, FNP  antiseptic oral rinse (BIOTENE) LIQD 1 application by Mouth Rinse route daily. FOR DRY MOUTH   Yes [provider]  B Complex CAPS Take 1 capsule by mouth every morning.    Yes [provider]  benazepril-hydrochlorthiazide (LOTENSIN HCT) 20-12.5 MG tablet TAKE (1) TABLET BY MOUTH ONCE DAILY. 08/11/17  Yes Martin, Mary-Margaret, FNP  Cholecalciferol (VITAMIN D3) 2000 units TABS Take 1 capsule by mouth daily. Patient states that she alternates with Hair, Skin, and Nails.   Yes [provider]  co-enzyme Q-10 50 MG capsule Take  50 mg by mouth once a week.    Yes [provider]  dicyclomine (BENTYL) 20 MG tablet TAKE 1/2 TABLET TWICE DAILY BEFORE A MEAL. Patient taking differently: Take 10-20 mg by mouth daily.  08/11/17  Yes Martin, Mary-Margaret, FNP  fenofibrate (TRICOR) 145 MG tablet Take 1 tablet (145 mg total) by mouth daily. 05/11/17  Yes Martin, Mary-Margaret, FNP  guaiFENesin (MUCINEX) 600 MG 12 hr tablet Take 600 mg by mouth 2 (two) times daily.   Yes [provider]  hydrocortisone (ANUSOL-HC) 2.5 % rectal cream Place 1 application rectally 2 (two) times daily. 04/05/17  Yes Rehman,  Mechele Dawley, MD  levofloxacin (LEVAQUIN) 500 MG tablet Take 1 tablet (500 mg total) by mouth every other day. 10/05/17  Yes Hawks, Christy A, FNP  Loperamide HCl (IMODIUM PO) Take 1 tablet by mouth daily as needed (diarrhea).   Yes [provider]  loratadine-pseudoephedrine (CLARITIN-D 12-HOUR) 5-120 MG tablet Take 1 tablet by mouth every 12 (twelve) hours.   Yes [provider]  Multiple Vitamins-Minerals (WOMENS MULTI VITAMIN & MINERAL PO) Take 1 tablet by mouth every other day.    Yes [provider]  Omega-3 Fatty Acids (FISH OIL) 1000 MG CPDR Take 3 capsules by mouth daily.    Yes [provider]  ondansetron (ZOFRAN) 4 MG tablet TAKE 1 TABLET TWICE DAILY AS NEEDED FOR NAUSEA AND VOMITING. Patient taking differently: Take 2-4 mg by mouth 2 (two) times daily as needed for nausea.  04/05/17  Yes Rehman, Mechele Dawley, MD  OVER THE COUNTER MEDICATION Take 1 tablet by mouth daily. Vitamin K -2 Patient states that she takes daily with her Vitamin D.    Yes [provider]  oxyCODONE (OXY IR/ROXICODONE) 5 MG immediate release tablet Take 1 tablet (5 mg total) by mouth 2 (two) times daily. 10/10/17 11/09/17 Yes Martin, Mary-Margaret, FNP  Probiotic Product (PROBIOTIC FORMULA PO) Take 1-2 capsules by mouth every morning.    Yes [provider]  promethazine (PHENERGAN) 12.5 MG tablet Take 1 tablet (12.5 mg total) by mouth every 6 (six) hours as needed. 09/05/17  Yes Lily Kocher, PA-C  QUEtiapine (SEROQUEL) 100 MG tablet Take 1 tablet (100 mg total) by mouth at bedtime. 08/11/17  Yes Hassell Done, Mary-Margaret, FNP  shark liver oil-cocoa butter (PREPARATION H) 0.25-3-85.5 % suppository Place 1 suppository rectally as needed for hemorrhoids.   Yes [provider]  vitamin E 400 UNIT capsule Take 400 Units by mouth every morning.    Yes [provider]  chlorpheniramine-HYDROcodone (TUSSIONEX PENNKINETIC ER) 10-8 MG/5ML SUER Take 5 mLs by mouth every  12 (twelve) hours as needed for cough. 10/13/17   Sherwood Gambler, MD  polyethylene glycol [facility]) packet Take 17 g by mouth daily. Patient not taking: Reported on 10/13/2017 09/04/17   Rancour, Annie Main, MD  sodium chloride (OCEAN) 0.65 % SOLN nasal spray Place 2 sprays into the nose as needed for congestion. 05/05/11 06/07/11  Delfina Redwood, MD    Family History Family History  Problem Relation Age of Onset  . Heart attack Mother 37       Died in her sleep  . Diabetes Mother   . Heart attack Father 33       Died in her sleep  . Diabetes Father   . Heart attack Brother 67       Defib, CABG  . Diabetes Brother   . Cancer Sister     Social History Social History   Tobacco  Use  . Smoking status: Never Smoker  . Smokeless tobacco: Never Used  Substance Use Topics  . Alcohol use: Yes    Alcohol/week: 0.0 oz    Comment: Ocassionally a small glass of wine. Patient states that is has been 2 months since she drank anything.  . Drug use: No     Allergies   Azathioprine; Tape; Codeine; Lac bovis; Penicillins; Povidone-iodine; Procaine hcl; Sulfonamide derivatives; Tramadol; Acyclovir and related; Azithromycin; Clonazepam; Dairy aid [lactase]; Doxycycline; Latex; and Niacin and related   Review of Systems Review of Systems  Constitutional: Positive for fever.  HENT: Positive for congestion, ear pain and sore throat.   Respiratory: Positive for cough and shortness of breath.   Cardiovascular: Negative for chest pain.  Gastrointestinal: Negative for abdominal pain and vomiting.  All other systems reviewed and are negative.    Physical Exam Updated Vital Signs BP (!) 106/59   Pulse 93   Temp 97.8 F (36.6 C) (Oral)   Resp 17   Ht 5' 8"  (1.727 m)   Wt 68.5 kg (151 lb)   SpO2 98%   BMI 22.96 kg/m   Physical Exam  Constitutional: She is oriented to person, place, and time. She appears well-developed and well-nourished.  Non-toxic appearance. She does not appear ill.    HENT:  Head: Normocephalic and atraumatic.  Right Ear: External ear normal.  Left Ear: Tympanic membrane, external ear and ear canal normal.  Nose: Nose normal.  Right TM visualization partially obscured by wax, no impaction  Eyes: Right eye exhibits no discharge. Left eye exhibits no discharge.  Neck: Normal range of motion. Muscular tenderness (mild) present. No spinous process tenderness present.    Cardiovascular: Normal rate, regular rhythm and normal heart sounds.  Pulmonary/Chest: Effort normal and breath sounds normal. She has no wheezes. She has no rales.  Abdominal: Soft. There is no tenderness.  Neurological: She is alert and oriented to person, place, and time.  Skin: Skin is warm and dry.  Nursing note and vitals reviewed.    ED Treatments / Results  Labs (all labs ordered are listed, but only abnormal results are displayed) Labs Reviewed  COMPREHENSIVE METABOLIC PANEL - Abnormal; Notable for the following components:      Result Value   Sodium 130 (*)    Chloride 96 (*)    Glucose, Bld 120 (*)    BUN 25 (*)    Creatinine, Ser 1.63 (*)    GFR calc non Af Amer 30 (*)    GFR calc Af Amer 34 (*)    All other components within normal limits  CBC WITH DIFFERENTIAL/PLATELET - Abnormal; Notable for the following components:   Hemoglobin 11.8 (*)    HCT 34.8 (*)    All other components within normal limits  BRAIN NATRIURETIC PEPTIDE    EKG None  Radiology Dg Chest 2 View  Result Date: 10/13/2017 CLINICAL DATA:  Productive cough for 6 weeks. Shortness of breath. Recent diagnosis of pneumonia. EXAM: CHEST - 2 VIEW COMPARISON:  Chest radiograph September 04, 2017 FINDINGS: Small LEFT pleural effusion. Similar LEFT lung base strandy densities. Cardiomediastinal silhouette is normal. Calcified aortic knob. Mild biapical pleural thickening. No pneumothorax. Soft tissue planes and included osseous structures are non suspicious. Surgical clips in the included right abdomen  compatible with cholecystectomy. IMPRESSION: Small LEFT pleural effusion.  LEFT lung base atelectasis/scarring. Electronically Signed   By: Elon Alas M.D.   On: 10/13/2017 17:58    Procedures Procedures (including critical  care time)  Medications Ordered in ED Medications  albuterol (PROVENTIL HFA;VENTOLIN HFA) 108 (90 Base) MCG/ACT inhaler 2 puff (2 puffs Inhalation Given 10/13/17 1743)     Initial Impression / Assessment and Plan / ED Course  I have reviewed the triage vital signs and the nursing notes.  Pertinent labs & imaging results that were available during my care of the patient were reviewed by me and considered in my medical decision making (see chart for details).     Chest x-ray shows small pleural effusion on the left.  Review of chest x-ray from 11 days ago shows bilateral small pleural effusions.  Thus labs obtained to help rule out kidney failure, liver failure or significant evidence of CHF.  She has no pedal edema or other swelling.  Labs are around baseline with a normal BNP.  This is probably postinfectious.  I do not think changing antibiotics is warranted given normal WBC, no fever, no hypoxia, and no respiratory distress.  I will try an albuterol inhaler for her to try at home and also prescribe Tussionex given the distressing nature of the cough for her and how it is affecting her life.  Otherwise follow-up with PCP.  Return precautions.  Final Clinical Impressions(s) / ED Diagnoses   Final diagnoses:  Cough  Pleural effusion on left    ED Discharge Orders        Ordered    chlorpheniramine-HYDROcodone (TUSSIONEX PENNKINETIC ER) 10-8 MG/5ML SUER  Every 12 hours PRN     10/13/17 2007       Sherwood Gambler, MD 10/13/17 2022

## 2017-10-13 NOTE — Discharge Instructions (Signed)
If you develop fever, worsening shortness of breath, chest pain, or any other new/concerning symptoms then return to the ER for evaluation.  Otherwise follow-up with your primary care physician.

## 2017-10-13 NOTE — ED Triage Notes (Signed)
Diagnosed with pneumonia on 7-16. Pt reports she is on her second antibiotic with little to no relief. Cough and congested with yellow sputum

## 2017-10-19 ENCOUNTER — Ambulatory Visit (INDEPENDENT_AMBULATORY_CARE_PROVIDER_SITE_OTHER): Payer: Medicare Other | Admitting: Internal Medicine

## 2017-10-20 ENCOUNTER — Ambulatory Visit: Payer: Medicare Other | Admitting: *Deleted

## 2017-10-27 ENCOUNTER — Ambulatory Visit (INDEPENDENT_AMBULATORY_CARE_PROVIDER_SITE_OTHER): Payer: Medicare Other | Admitting: Internal Medicine

## 2017-10-27 NOTE — Progress Notes (Signed)
In Care Everywhere

## 2017-10-29 ENCOUNTER — Ambulatory Visit: Payer: Medicare Other

## 2017-11-09 ENCOUNTER — Other Ambulatory Visit: Payer: Self-pay | Admitting: Nurse Practitioner

## 2017-11-09 DIAGNOSIS — F3342 Major depressive disorder, recurrent, in full remission: Secondary | ICD-10-CM

## 2017-11-15 ENCOUNTER — Encounter: Payer: Self-pay | Admitting: Nurse Practitioner

## 2017-11-15 ENCOUNTER — Ambulatory Visit (INDEPENDENT_AMBULATORY_CARE_PROVIDER_SITE_OTHER): Payer: Medicare Other | Admitting: Nurse Practitioner

## 2017-11-15 VITALS — BP 140/83 | HR 82 | Temp 97.8°F | Ht 68.0 in | Wt 155.0 lb

## 2017-11-15 DIAGNOSIS — M797 Fibromyalgia: Secondary | ICD-10-CM

## 2017-11-15 DIAGNOSIS — E8881 Metabolic syndrome: Secondary | ICD-10-CM | POA: Diagnosis not present

## 2017-11-15 DIAGNOSIS — I10 Essential (primary) hypertension: Secondary | ICD-10-CM

## 2017-11-15 DIAGNOSIS — F3342 Major depressive disorder, recurrent, in full remission: Secondary | ICD-10-CM

## 2017-11-15 DIAGNOSIS — N183 Chronic kidney disease, stage 3 unspecified: Secondary | ICD-10-CM

## 2017-11-15 DIAGNOSIS — K5150 Left sided colitis without complications: Secondary | ICD-10-CM

## 2017-11-15 DIAGNOSIS — E785 Hyperlipidemia, unspecified: Secondary | ICD-10-CM

## 2017-11-15 DIAGNOSIS — F5101 Primary insomnia: Secondary | ICD-10-CM | POA: Diagnosis not present

## 2017-11-15 MED ORDER — ALPRAZOLAM 0.5 MG PO TABS: 0.5000 mg | ORAL_TABLET | Freq: Two times a day (BID) | ORAL | 3 refills | Status: DC | PRN

## 2017-11-15 MED ORDER — DICYCLOMINE HCL 20 MG PO TABS: 10.0000 mg | ORAL_TABLET | Freq: Every day | ORAL | 3 refills | Status: DC

## 2017-11-15 MED ORDER — FENOFIBRATE 145 MG PO TABS: 145.0000 mg | ORAL_TABLET | Freq: Every day | ORAL | 1 refills | Status: DC

## 2017-11-15 MED ORDER — OXYCODONE HCL 5 MG PO TABS: 5.0000 mg | ORAL_TABLET | ORAL | 0 refills | Status: DC | PRN

## 2017-11-15 MED ORDER — QUETIAPINE FUMARATE 100 MG PO TABS: 100.0000 mg | ORAL_TABLET | Freq: Every day | ORAL | 3 refills | Status: DC

## 2017-11-15 MED ORDER — BENAZEPRIL-HYDROCHLOROTHIAZIDE 20-12.5 MG PO TABS: ORAL_TABLET | ORAL | 1 refills | Status: DC

## 2017-11-15 NOTE — Patient Instructions (Signed)

## 2017-11-15 NOTE — Progress Notes (Signed)
Subjective:    Patient ID: [name], female    DOB: March 20, 1941, 77 y.o.   MRN: [id number]   Chief Complaint: Medical Management of Chronic Issues   HPI:  1. Essential hypertension  No c/o chest pain, sob or headache. Does not check blood pressure at home. BP Readings from Last 3 Encounters:  11/15/17 140/83  10/13/17 (!) 106/59  10/05/17 134/79     2. CKD (chronic kidney disease) stage 3, GFR 30-59 ml/min (HCC)  We are just watching labs. denies any peripheral edema  3. Metabolic syndrome  Dos not check blood sugars at ome.   4. Primary insomnia  Takes seroquil to sleep at night. Helps better then anything else she has taken.  5. Hyperlipidemia with target LDL less than 100  Tries to avoid fried foods. No dedicated exercise  6. Fibromyalgia  Pain assessment: Cause of pain- fibromyalgia Pain location- mainly in buttocks  Pain on scale of 1-10- 2/10 Frequency- daily What increases pain-lots of movement What makes pain Better-being still Effects on ADL - she gets done what she need sto Any change in general medical condition-none  Current medications- oxycodone 18m bid Effectiveness of current meds-helsp make pain tolerable Adverse reactions form pain meds-none Morphine equivalent- 15  Pill count performed-No [place] drug screen- No [place]  If yes were their any concerning findings? - no  Pain contract signed on: 11/15/17   7. Recurrent major depressive disorder, in full remission ([facility]  She is only on xanax on prn basis    Outpatient Encounter Medications as of 11/15/2017  Medication Sig  . ALPRAZolam (XANAX) 0.5 MG tablet Take 1 Tablet by mouth 2 times a day as needed FOR ANXIETY (Patient taking differently: Take 0.5 mg by mouth 2 (two) times daily as needed for anxiety. )  . antiseptic oral rinse (BIOTENE) LIQD 1 application by Mouth Rinse route daily. FOR DRY MOUTH  . benazepril-hydrochlorthiazide (LOTENSIN HCT) 20-12.5 MG tablet TAKE  (1) TABLET BY MOUTH ONCE DAILY.  .Marland KitchenCholecalciferol (VITAMIN D3) 2000 units TABS Take 1 capsule by mouth daily. Patient states that she alternates with Hair, Skin, and Nails.  .Marland Kitchenco-enzyme Q-10 50 MG capsule Take 50 mg by mouth once a week.   . dicyclomine (BENTYL) 20 MG tablet TAKE 1/2 TABLET TWICE DAILY BEFORE A MEAL. (Patient taking differently: Take 10-20 mg by mouth daily. )  . fenofibrate (TRICOR) 145 MG tablet Take 1 tablet (145 mg total) by mouth daily.  . hydrocortisone (ANUSOL-HC) 2.5 % rectal cream Place 1 application rectally 2 (two) times daily.  . Loperamide HCl (IMODIUM PO) Take 1 tablet by mouth daily as needed (diarrhea).  . Multiple Vitamins-Minerals (WOMENS MULTI VITAMIN & MINERAL PO) Take 1 tablet by mouth every other day.   . Omega-3 Fatty Acids (FISH OIL) 1000 MG CPDR Take 3 capsules by mouth daily.   . ondansetron (ZOFRAN) 4 MG tablet TAKE 1 TABLET TWICE DAILY AS NEEDED FOR NAUSEA AND VOMITING. (Patient taking differently: Take 2-4 mg by mouth 2 (two) times daily as needed for nausea. )  . OVER THE COUNTER MEDICATION Take 1 tablet by mouth daily. Vitamin K -2 Patient states that she takes daily with her Vitamin D.   . polyethylene glycol (MIRALAX) packet Take 17 g by mouth daily.  . Probiotic Product (PROBIOTIC FORMULA PO) Take 1-2 capsules by mouth every morning.   . promethazine (PHENERGAN) 12.5 MG tablet Take 1 tablet (12.5 mg total) by mouth every 6 (six)  hours as needed.  Marland Kitchen QUEtiapine (SEROQUEL) 100 MG tablet Take 1 tablet (100 mg total) by mouth at bedtime.  . shark liver oil-cocoa butter (PREPARATION H) 0.25-3-85.5 % suppository Place 1 suppository rectally as needed for hemorrhoids.  . vitamin E 400 UNIT capsule Take 400 Units by mouth every morning.   . [DISCONTINUED] chlorpheniramine-HYDROcodone (TUSSIONEX PENNKINETIC ER) 10-8 MG/5ML SUER Take 5 mLs by mouth every 12 (twelve) hours as needed for cough.  . [DISCONTINUED] levofloxacin (LEVAQUIN) 500 MG tablet Take 1  tablet (500 mg total) by mouth every other day.  . loratadine-pseudoephedrine (CLARITIN-D 12-HOUR) 5-120 MG tablet Take 1 tablet by mouth every 12 (twelve) hours.     New complaints: None today  Social history: Lives by herself. Has 2 nieces that call her everyday and worry her to death   Review of Systems  Constitutional: Negative for activity change and appetite change.  HENT: Negative.   Eyes: Negative for pain.  Respiratory: Negative for shortness of breath.   Cardiovascular: Negative for chest pain, palpitations and leg swelling.  Gastrointestinal: Negative for abdominal pain.  Endocrine: Negative for polydipsia.  Genitourinary: Negative.   Skin: Negative for rash.  Neurological: Negative for dizziness, weakness and headaches.  Hematological: Does not bruise/bleed easily.  Psychiatric/Behavioral: Negative.   All other systems reviewed and are negative.      Objective:   Physical Exam  Constitutional: She is oriented to person, place, and time. She appears well-developed and well-nourished. No distress.  HENT:  Head: Normocephalic.  Nose: Nose normal.  Mouth/Throat: Oropharynx is clear and moist.  Eyes: Pupils are equal, round, and reactive to light. EOM are normal.  Neck: Normal range of motion. Neck supple. No JVD present. Carotid bruit is not present.  Cardiovascular: Normal rate, regular rhythm, normal heart sounds and intact distal pulses.  Pulmonary/Chest: Effort normal and breath sounds normal. No respiratory distress. She has no wheezes. She has no rales. She exhibits no tenderness.  Abdominal: Soft. Normal appearance, normal aorta and bowel sounds are normal. She exhibits no distension, no abdominal bruit, no pulsatile midline mass and no mass. There is no splenomegaly or hepatomegaly. There is no tenderness.  Musculoskeletal: Normal range of motion. She exhibits no edema.  Lymphadenopathy:    She has no cervical adenopathy.  Neurological: She is alert and  oriented to person, place, and time. She has normal reflexes.  Skin: Skin is warm and dry.  Psychiatric: She has a normal mood and affect. Her behavior is normal. Judgment and thought content normal.  Nursing note and vitals reviewed.  BP 140/83   Pulse 82   Temp 97.8 F (36.6 C) (Oral)   Ht 5' 8"  (1.727 m)   Wt 155 lb (70.3 kg)   BMI 23.57 kg/m         Assessment & Plan:  [name] comes in today with chief complaint of Medical Management of Chronic Issues   Diagnosis and orders addressed:  1. Essential hypertension Low sodium diet - benazepril-hydrochlorthiazide (LOTENSIN HCT) 20-12.5 MG tablet; TAKE (1) TABLET BY MOUTH ONCE DAILY.  Dispense: 90 tablet; Refill: 1  2. CKD (chronic kidney disease) stage 3, GFR 30-59 ml/min (HCC) Labs pending  3. Metabolic syndrome Watch carbs in diet  4. Primary insomnia Bedtime routine - QUEtiapine (SEROQUEL) 100 MG tablet; Take 1 tablet (100 mg total) by mouth at bedtime.  Dispense: 30 tablet; Refill: 3  5. Hyperlipidemia with target LDL less than 100 Low fat diet - fenofibrate (TRICOR) 145 MG tablet;  Take 1 tablet (145 mg total) by mouth daily.  Dispense: 90 tablet; Refill: 1  6. Fibromyalgia Pain meds as rx  7. Recurrent major depressive disorder, in full remission ([place]) tey to wean down from xanax - ALPRAZolam (XANAX) 0.5 MG tablet; Take 1 tablet (0.5 mg total) by mouth 2 (two) times daily as needed for anxiety.  Dispense: 60 tablet; Refill: 3  8. ULCERATIVE COLITIS, LEFT SIDED - dicyclomine (BENTYL) 20 MG tablet; Take 0.5-1 tablets (10-20 mg total) by mouth daily.  Dispense: 90 tablet; Refill: 3  * wil reschedule eye exam Labs pending Health Maintenance reviewed Diet and exercise encouraged  Follow up plan: 3 months   Mary-Margaret Hassell Done, FNP

## 2017-11-29 ENCOUNTER — Ambulatory Visit: Payer: Medicare Other | Admitting: *Deleted

## 2017-12-14 ENCOUNTER — Ambulatory Visit: Payer: Medicare Other | Admitting: *Deleted

## 2018-02-04 ENCOUNTER — Telehealth: Payer: Self-pay | Admitting: Nurse Practitioner

## 2018-02-04 NOTE — Telephone Encounter (Signed)
Pt believes she may have sinus infection, ears bleeding, sore throat, would like something sent to [facility]

## 2018-02-05 ENCOUNTER — Telehealth: Payer: Self-pay | Admitting: Nurse Practitioner

## 2018-02-05 ENCOUNTER — Other Ambulatory Visit: Payer: Self-pay | Admitting: Physician Assistant

## 2018-02-05 MED ORDER — CIPROFLOXACIN HCL 250 MG PO TABS: 250.0000 mg | ORAL_TABLET | Freq: Two times a day (BID) | ORAL | 0 refills | Status: DC

## 2018-02-05 NOTE — Telephone Encounter (Signed)
I have sent the medication, not sure if yesterday's phone call needs closing?

## 2018-02-05 NOTE — Telephone Encounter (Signed)
Ear pain , ST , sinus drainage and pressure - wants antibiotic sent to [place]

## 2018-02-05 NOTE — Telephone Encounter (Signed)
Sent cipro to [facility]

## 2018-02-07 NOTE — Telephone Encounter (Signed)
Left message to call back  

## 2018-02-07 NOTE — Telephone Encounter (Signed)
ntbs

## 2018-02-11 NOTE — Telephone Encounter (Signed)
Cipro sent to pharmacy per 02/05/18 telephone note

## 2018-02-15 ENCOUNTER — Encounter: Payer: Self-pay | Admitting: Nurse Practitioner

## 2018-02-15 ENCOUNTER — Ambulatory Visit: Payer: Medicare Other | Admitting: Nurse Practitioner

## 2018-02-15 VITALS — BP 129/88 | HR 92 | Temp 97.1°F | Ht 68.0 in | Wt 157.0 lb

## 2018-02-15 DIAGNOSIS — E785 Hyperlipidemia, unspecified: Secondary | ICD-10-CM

## 2018-02-15 DIAGNOSIS — I10 Essential (primary) hypertension: Secondary | ICD-10-CM | POA: Diagnosis not present

## 2018-02-15 DIAGNOSIS — N183 Chronic kidney disease, stage 3 unspecified: Secondary | ICD-10-CM

## 2018-02-15 DIAGNOSIS — H6123 Impacted cerumen, bilateral: Secondary | ICD-10-CM

## 2018-02-15 DIAGNOSIS — R928 Other abnormal and inconclusive findings on diagnostic imaging of breast: Secondary | ICD-10-CM

## 2018-02-15 DIAGNOSIS — E8881 Metabolic syndrome: Secondary | ICD-10-CM

## 2018-02-15 DIAGNOSIS — K5150 Left sided colitis without complications: Secondary | ICD-10-CM | POA: Diagnosis not present

## 2018-02-15 DIAGNOSIS — F3342 Major depressive disorder, recurrent, in full remission: Secondary | ICD-10-CM

## 2018-02-15 DIAGNOSIS — H60503 Unspecified acute noninfective otitis externa, bilateral: Secondary | ICD-10-CM

## 2018-02-15 DIAGNOSIS — M797 Fibromyalgia: Secondary | ICD-10-CM

## 2018-02-15 DIAGNOSIS — F5101 Primary insomnia: Secondary | ICD-10-CM

## 2018-02-15 LAB — BAYER DCA HB A1C WAIVED: HB A1C (BAYER DCA - WAIVED): 6.6 % (ref <7.0)

## 2018-02-15 MED ORDER — QUETIAPINE FUMARATE 100 MG PO TABS: 100.0000 mg | ORAL_TABLET | Freq: Every day | ORAL | 1 refills | Status: DC

## 2018-02-15 MED ORDER — BENAZEPRIL-HYDROCHLOROTHIAZIDE 20-12.5 MG PO TABS: ORAL_TABLET | ORAL | 1 refills | Status: DC

## 2018-02-15 MED ORDER — DICYCLOMINE HCL 20 MG PO TABS: 10.0000 mg | ORAL_TABLET | Freq: Every day | ORAL | 1 refills | Status: DC

## 2018-02-15 MED ORDER — ALPRAZOLAM 0.5 MG PO TABS: 0.5000 mg | ORAL_TABLET | Freq: Two times a day (BID) | ORAL | 3 refills | Status: DC | PRN

## 2018-02-15 MED ORDER — OXYCODONE HCL 5 MG PO TABS: 5.0000 mg | ORAL_TABLET | ORAL | 0 refills | Status: DC | PRN

## 2018-02-15 MED ORDER — FENOFIBRATE 145 MG PO TABS: 145.0000 mg | ORAL_TABLET | Freq: Every day | ORAL | 1 refills | Status: DC

## 2018-02-15 MED ORDER — OFLOXACIN 0.3 % OT SOLN: 5.0000 [drp] | Freq: Two times a day (BID) | OTIC | 0 refills | Status: DC

## 2018-02-15 NOTE — Progress Notes (Signed)
Subjective:    Patient ID: [name], female    DOB: 01/09/1941, 77 y.o.   MRN: [id number]   Chief Complaint: medical management of chronic issues  HPI:  1. Essential hypertension  No c/o chest pain, sob or headache. Does not check blood pressure at home. BP Readings from Last 3 Encounters:  11/15/17 140/83  10/13/17 (!) 106/59  10/05/17 134/79     2. ULCERATIVE COLITIS, LEFT SIDED  Has constant diarrhea and abdominal pain. Takes pain meds for this and fibromyalgia. Still on bentyl and imodium  3. Fibromyalgia  Pain assessment: Cause of pain- fibromyalgia and ulcerative colitis Pain location- varies Pain on scale of 1-10- 5/10 currently Frequency- daily What increases pain-lots of movement What makes pain Better-resting Effects on ADL - some days she is not able to do much of anything due  To pain Any change in general medical condition-none  Current medications- roxicodone Effectiveness of current meds-helps with pain Adverse reactions form pain meds-none Morphine equivalent- 45  Pill count performed-No Urine drug screen- No Was the [place] reviewed- yes  If yes were their any concerning findings? - none  Pain contract signed on: 11/19/17   4. CKD (chronic kidney disease) stage 3, GFR 30-59 ml/min (HCC)  Currently watching labs  5. Recurrent major depressive disorder, in full remission ([place])  She is currently on no rx antidepressants  6. Hyperlipidemia with target LDL less than 100  Does not really watch fat sin diet. Has to watch diet for her ulcerative colitis so it limits what she can eat  7. Primary insomnia  She takes seroquel at night for sleep which she says works well. It also helps with her depression  8. Metabolic syndrome  JASN0N has been 6.2. She does niot check blood sugars at home.    Outpatient Encounter Medications as of 02/15/2018  Medication Sig  . ALPRAZolam (XANAX) 0.5 MG tablet Take 1 tablet (0.5 mg total) by mouth 2 (two) times daily as  needed for anxiety.  Marland Kitchen antiseptic oral rinse (BIOTENE) LIQD 1 application by Mouth Rinse route daily. FOR DRY MOUTH  . benazepril-hydrochlorthiazide (LOTENSIN HCT) 20-12.5 MG tablet TAKE (1) TABLET BY MOUTH ONCE DAILY.  Marland Kitchen Cholecalciferol (VITAMIN D3) 2000 units TABS Take 1 capsule by mouth daily. Patient states that she alternates with Hair, Skin, and Nails.  . ciprofloxacin (CIPRO) 250 MG tablet Take 1 tablet (250 mg total) by mouth 2 (two) times daily.  Marland Kitchen co-enzyme Q-10 50 MG capsule Take 50 mg by mouth once a week.   . dicyclomine (BENTYL) 20 MG tablet Take 0.5-1 tablets (10-20 mg total) by mouth daily.  . fenofibrate (TRICOR) 145 MG tablet Take 1 tablet (145 mg total) by mouth daily.  . hydrocortisone (ANUSOL-HC) 2.5 % rectal cream Place 1 application rectally 2 (two) times daily.  . Loperamide HCl (IMODIUM PO) Take 1 tablet by mouth daily as needed (diarrhea).  . loratadine-pseudoephedrine (CLARITIN-D 12-HOUR) 5-120 MG tablet Take 1 tablet by mouth every 12 (twelve) hours.  . Multiple Vitamins-Minerals (WOMENS MULTI VITAMIN & MINERAL PO) Take 1 tablet by mouth every other day.   . Omega-3 Fatty Acids (FISH OIL) 1000 MG CPDR Take 3 capsules by mouth daily.   . ondansetron (ZOFRAN) 4 MG tablet TAKE 1 TABLET TWICE DAILY AS NEEDED FOR NAUSEA AND VOMITING. (Patient taking differently: Take 2-4 mg by mouth 2 (two) times daily as needed for nausea. )  . OVER THE COUNTER MEDICATION Take 1 tablet by mouth daily. Vitamin K -  2 Patient states that she takes daily with her Vitamin D.   . oxyCODONE (ROXICODONE) 5 MG immediate release tablet Take 1 tablet (5 mg total) by mouth every 4 (four) hours as needed for severe pain.  . polyethylene glycol (MIRALAX) packet Take 17 g by mouth daily.  . Probiotic Product (PROBIOTIC FORMULA PO) Take 1-2 capsules by mouth every morning.   . promethazine (PHENERGAN) 12.5 MG tablet Take 1 tablet (12.5 mg total) by mouth every 6 (six) hours as needed.  Marland Kitchen QUEtiapine  (SEROQUEL) 100 MG tablet Take 1 tablet (100 mg total) by mouth at bedtime.  . shark liver oil-cocoa butter (PREPARATION H) 0.25-3-85.5 % suppository Place 1 suppository rectally as needed for hemorrhoids.  . vitamin E 400 UNIT capsule Take 400 Units by mouth every morning.      New complaints: Both ears are stopped up and she is having trouble hearing.  Social history: Lives alone. Stays very active.   Review of Systems  Constitutional: Negative for activity change and appetite change.  HENT: Positive for ear pain.   Eyes: Negative for pain.  Respiratory: Negative for shortness of breath.   Cardiovascular: Negative for chest pain, palpitations and leg swelling.  Gastrointestinal: Negative for abdominal pain.  Endocrine: Negative for polydipsia.  Genitourinary: Negative.   Skin: Negative for rash.  Neurological: Negative for dizziness, weakness and headaches.  Hematological: Does not bruise/bleed easily.  Psychiatric/Behavioral: Negative.   All other systems reviewed and are negative.      Objective:   Physical Exam  Constitutional: She is oriented to person, place, and time. She appears well-developed and well-nourished. No distress.  HENT:  Head: Normocephalic.  Nose: Nose normal.  Mouth/Throat: Oropharynx is clear and moist.  cerumen impaction bil  Eyes: Pupils are equal, round, and reactive to light. EOM are normal.  Neck: Normal range of motion. Neck supple. No JVD present. Carotid bruit is not present.  Cardiovascular: Normal rate, regular rhythm, normal heart sounds and intact distal pulses.  Pulmonary/Chest: Effort normal and breath sounds normal. No respiratory distress. She has no wheezes. She has no rales. She exhibits no tenderness.  Abdominal: Soft. Normal appearance, normal aorta and bowel sounds are normal. She exhibits no distension, no abdominal bruit, no pulsatile midline mass and no mass. There is no splenomegaly or hepatomegaly. There is tenderness  (diffuse tenderness throughout).  Musculoskeletal: Normal range of motion. She exhibits no edema.  Lymphadenopathy:    She has no cervical adenopathy.  Neurological: She is alert and oriented to person, place, and time. She has normal reflexes.  Skin: Skin is warm and dry.  Psychiatric: She has a normal mood and affect. Her behavior is normal. Judgment and thought content normal.  Nursing note and vitals reviewed.  BP 129/88   Pulse 92   Temp (!) 97.1 F (36.2 C) (Oral)   Ht 5' 8"  (1.727 m)   Wt 157 lb (71.2 kg)   BMI 23.87 kg/m   S/P ear irrigation with curetting - white excudate , foul smelling bil ear canalsl-Mary-Margaret Hassell Done, FNP       Assessment & Plan:  [name] comes in today with chief complaint of Medical Management of Chronic Issues   Diagnosis and orders addressed:  1. Essential hypertension Low sodium diet - CMP14+EGFR - benazepril-hydrochlorthiazide (LOTENSIN HCT) 20-12.5 MG tablet; TAKE (1) TABLET BY MOUTH ONCE DAILY.  Dispense: 90 tablet; Refill: 1  2. ULCERATIVE COLITIS, LEFT SIDED Watch diet - dicyclomine (BENTYL) 20 MG tablet; Take 0.5-1 tablets (  10-20 mg total) by mouth daily.  Dispense: 90 tablet; Refill: 1  3. Fibromyalgia Keep muscles warm - oxyCODONE (ROXICODONE) 5 MG immediate release tablet; Take 1 tablet (5 mg total) by mouth every 4 (four) hours as needed for severe pain.  Dispense: 60 tablet; Refill: 0 - oxyCODONE (ROXICODONE) 5 MG immediate release tablet; Take 1 tablet (5 mg total) by mouth every 4 (four) hours as needed for severe pain.  Dispense: 60 tablet; Refill: 0 - oxyCODONE (ROXICODONE) 5 MG immediate release tablet; Take 1 tablet (5 mg total) by mouth every 4 (four) hours as needed for severe pain.  Dispense: 60 tablet; Refill: 0  4. CKD (chronic kidney disease) stage 3, GFR 30-59 ml/min (HCC) Labs pending  5. Recurrent major depressive disorder, in full remission ([place]) Stress management - ALPRAZolam (XANAX) 0.5 MG tablet;  Take 1 tablet (0.5 mg total) by mouth 2 (two) times daily as needed for anxiety.  Dispense: 60 tablet; Refill: 3  6. Hyperlipidemia with target LDL less than 100 Low fat diet - Lipid panel - fenofibrate (TRICOR) 145 MG tablet; Take 1 tablet (145 mg total) by mouth daily.  Dispense: 90 tablet; Refill: 1  7. Primary insomnia Bedtime routine - QUEtiapine (SEROQUEL) 100 MG tablet; Take 1 tablet (100 mg total) by mouth at bedtime.  Dispense: 90 tablet; Refill: 1  8. Metabolic syndrome Watch carbs in diet - Bayer DCA Hb A1c Waived  9. Abnormal mammogram She [place] want to [place] - MM DIAG BREAST TOMO BILATERAL; Future  10. Otitis externa -floxin otic bid  Labs pending Health Maintenance reviewed Diet and exercise encouraged  Follow up plan: 3 months   Mary-Margaret Hassell Done, FNP

## 2018-02-15 NOTE — Patient Instructions (Signed)

## 2018-02-16 LAB — LIPID PANEL
CHOL/HDL RATIO: 6 ratio — AB (ref 0.0–4.4)
Cholesterol, Total: 228 mg/dL — ABNORMAL HIGH (ref 100–199)
HDL: 38 mg/dL — ABNORMAL LOW (ref >39)
LDL CALC: 118 mg/dL — AB (ref 0–99)
Triglycerides: 362 mg/dL — ABNORMAL HIGH (ref 0–149)
VLDL Cholesterol Cal: 72 mg/dL — ABNORMAL HIGH (ref 5–40)

## 2018-02-16 LAB — CMP14+EGFR
ALBUMIN: 4.1 g/dL (ref 3.5–4.8)
ALK PHOS: 86 IU/L (ref 39–117)
ALT: 11 IU/L (ref 0–32)
AST: 16 IU/L (ref 0–40)
Albumin/Globulin Ratio: 1.5 (ref 1.2–2.2)
BUN / CREAT RATIO: 18 (ref 12–28)
BUN: 27 mg/dL (ref 8–27)
Bilirubin Total: 0.4 mg/dL (ref 0.0–1.2)
CO2: 23 mmol/L (ref 20–29)
CREATININE: 1.48 mg/dL — AB (ref 0.57–1.00)
Calcium: 9.4 mg/dL (ref 8.7–10.3)
Chloride: 103 mmol/L (ref 96–106)
GFR calc Af Amer: 39 mL/min/{1.73_m2} — ABNORMAL LOW (ref >59)
GFR, EST NON AFRICAN AMERICAN: 34 mL/min/{1.73_m2} — AB (ref >59)
GLOBULIN, TOTAL: 2.7 g/dL (ref 1.5–4.5)
GLUCOSE: 123 mg/dL — AB (ref 65–99)
POTASSIUM: 4.3 mmol/L (ref 3.5–5.2)
SODIUM: 142 mmol/L (ref 134–144)
TOTAL PROTEIN: 6.8 g/dL (ref 6.0–8.5)

## 2018-05-18 ENCOUNTER — Ambulatory Visit (INDEPENDENT_AMBULATORY_CARE_PROVIDER_SITE_OTHER): Payer: Medicare Other | Admitting: Nurse Practitioner

## 2018-05-18 ENCOUNTER — Encounter: Payer: Self-pay | Admitting: Nurse Practitioner

## 2018-05-18 VITALS — BP 132/93 | HR 84 | Temp 97.3°F | Ht 68.0 in | Wt 162.0 lb

## 2018-05-18 DIAGNOSIS — F3342 Major depressive disorder, recurrent, in full remission: Secondary | ICD-10-CM

## 2018-05-18 DIAGNOSIS — R110 Nausea: Secondary | ICD-10-CM

## 2018-05-18 DIAGNOSIS — E8881 Metabolic syndrome: Secondary | ICD-10-CM | POA: Diagnosis not present

## 2018-05-18 DIAGNOSIS — N183 Chronic kidney disease, stage 3 unspecified: Secondary | ICD-10-CM

## 2018-05-18 DIAGNOSIS — E785 Hyperlipidemia, unspecified: Secondary | ICD-10-CM | POA: Diagnosis not present

## 2018-05-18 DIAGNOSIS — K5150 Left sided colitis without complications: Secondary | ICD-10-CM

## 2018-05-18 DIAGNOSIS — M797 Fibromyalgia: Secondary | ICD-10-CM

## 2018-05-18 DIAGNOSIS — I10 Essential (primary) hypertension: Secondary | ICD-10-CM | POA: Diagnosis not present

## 2018-05-18 DIAGNOSIS — F5101 Primary insomnia: Secondary | ICD-10-CM | POA: Diagnosis not present

## 2018-05-18 LAB — BAYER DCA HB A1C WAIVED: HB A1C (BAYER DCA - WAIVED): 6.3 % (ref <7.0)

## 2018-05-18 MED ORDER — BENAZEPRIL-HYDROCHLOROTHIAZIDE 20-12.5 MG PO TABS: ORAL_TABLET | ORAL | 1 refills | Status: DC

## 2018-05-18 MED ORDER — OXYCODONE HCL 5 MG PO TABS: 5.0000 mg | ORAL_TABLET | ORAL | 0 refills | Status: DC | PRN

## 2018-05-18 MED ORDER — QUETIAPINE FUMARATE 100 MG PO TABS: 100.0000 mg | ORAL_TABLET | Freq: Every day | ORAL | 1 refills | Status: DC

## 2018-05-18 MED ORDER — DICYCLOMINE HCL 20 MG PO TABS: 10.0000 mg | ORAL_TABLET | Freq: Every day | ORAL | 1 refills | Status: DC

## 2018-05-18 MED ORDER — ALPRAZOLAM 0.5 MG PO TABS: 0.5000 mg | ORAL_TABLET | Freq: Two times a day (BID) | ORAL | 3 refills | Status: DC | PRN

## 2018-05-18 MED ORDER — FENOFIBRATE 145 MG PO TABS: 145.0000 mg | ORAL_TABLET | Freq: Every day | ORAL | 1 refills | Status: DC

## 2018-05-18 NOTE — Progress Notes (Signed)
Subjective:    Patient ID: [name], female    DOB: September 20, 1940, 78 y.o.   MRN: [id number]   Chief Complaint: medical management of chronic issues  HPI:  1. Essential hypertension  No c/o chest pain, sob or headache. Does not check blood pressure at home. BP Readings from Last 3 Encounters:  02/15/18 129/88  11/15/17 140/83  10/13/17 (!) 106/59     2. Hyperlipidemia with target LDL less than 100  Does not eat a lot of fried foods. Does go dancing 1-2 x a week.  3. Metabolic syndrome  Last HWTU8E was 6.6%. is currently doing diet control.  4. Primary insomnia  Was put on seroquel at last visit for sleep. She says it is helping.  5. Recurrent major depressive disorder, in full remission ([place])  She is not on antidepressant per say. But seroquel may help with this.  6. Fibromyalgia  Is on pain meds see pain assessment below  7. ULCERATIVE COLITIS, LEFT SIDED  has chronic pain but no recent severe flare ups.  8. CKD (chronic kidney disease) stage 3, GFR 30-59 ml/min (HCC)  Last creatine was 1.48. we will recheck labs today.   Pain assessment: Cause of pain- fibromyalgia and ulcerative colitis Pain location- all over myalgia as well as frequent abdominal pain  Pain on scale of 1-10- 6/10 Frequency- everyday What increases pain-lots of movement What makes pain Better-rest andpain meds help Effects on ADL - she is still able to get done what she needs ot get done Any change in general medical condition-none  Current medications- roxicodone 68m QID Effectiveness of current meds-helps Adverse reactions form pain meds-none Morphine equivalent- 45 MEDD  Pill count performed-No [place]- [place]  [place] were [place] any concerning findings? - none  Pain contract signed on: 11/16/17  Outpatient Encounter Medications as of 05/18/2018  Medication Sig  . ALPRAZolam (XANAX) 0.5 MG tablet Take 1 tablet (0.5 mg total) by mouth 2 (two) times daily  as needed for anxiety.  .Marland Kitchenantiseptic oral rinse (BIOTENE) LIQD 1 application by Mouth Rinse route daily. FOR DRY MOUTH  . benazepril-hydrochlorthiazide (LOTENSIN HCT) 20-12.5 MG tablet TAKE (1) TABLET BY MOUTH ONCE DAILY.  .Marland Kitchenco-enzyme Q-10 50 MG capsule Take 50 mg by mouth once a week.   . dicyclomine (BENTYL) 20 MG tablet Take 0.5-1 tablets (10-20 mg total) by mouth daily.  . fenofibrate (TRICOR) 145 MG tablet Take 1 tablet (145 mg total) by mouth daily.  . hydrocortisone (ANUSOL-HC) 2.5 % rectal cream Place 1 application rectally 2 (two) times daily.  . Loperamide HCl (IMODIUM PO) Take 1 tablet by mouth daily as needed (diarrhea).  . loratadine-pseudoephedrine (CLARITIN-D 12-HOUR) 5-120 MG tablet Take 1 tablet by mouth every 12 (twelve) hours.  . Multiple Vitamins-Minerals (WOMENS MULTI VITAMIN & MINERAL PO) Take 1 tablet by mouth every other day.   . ofloxacin (FLOXIN OTIC) 0.3 % OTIC solution Place 5 drops into both ears 2 (two) times daily.  . Omega-3 Fatty Acids (FISH OIL) 1000 MG CPDR Take 3 capsules by mouth daily.   . ondansetron (ZOFRAN) 4 MG tablet TAKE 1 TABLET TWICE DAILY AS NEEDED FOR NAUSEA AND VOMITING. (Patient taking differently: Take 2-4 mg by mouth 2 (two) times daily as needed for nausea. )  . OVER THE COUNTER MEDICATION Take 1 tablet by mouth daily. Vitamin K -2 Patient states that she takes daily with her Vitamin D.   . oxyCODONE (ROXICODONE) 5 MG  immediate release tablet Take 1 tablet (5 mg total) by mouth every 4 (four) hours as needed for severe pain.  Marland Kitchen oxyCODONE (ROXICODONE) 5 MG immediate release tablet Take 1 tablet (5 mg total) by mouth every 4 (four) hours as needed for severe pain.  Marland Kitchen oxyCODONE (ROXICODONE) 5 MG immediate release tablet Take 1 tablet (5 mg total) by mouth every 4 (four) hours as needed for severe pain.  . polyethylene glycol (MIRALAX) packet Take 17 g by mouth daily.  . Probiotic Product (PROBIOTIC FORMULA PO) Take 1-2 capsules by mouth every  morning.   Marland Kitchen QUEtiapine (SEROQUEL) 100 MG tablet Take 1 tablet (100 mg total) by mouth at bedtime.  . shark liver oil-cocoa butter (PREPARATION H) 0.25-3-85.5 % suppository Place 1 suppository rectally as needed for hemorrhoids.  . vitamin E 400 UNIT capsule Take 400 Units by mouth every morning.      New complaints: None today  Social history: Lives alone. Has friends that she does things with to stay busy.   Review of Systems  Constitutional: Negative for activity change and appetite change.  HENT: Negative.   Eyes: Negative for pain.  Respiratory: Negative for shortness of breath.   Cardiovascular: Negative for chest pain, palpitations and leg swelling.  Gastrointestinal: Positive for abdominal pain (due to ulcerative colitis).  Endocrine: Negative for polydipsia.  Genitourinary: Negative.   Musculoskeletal: Positive for back pain and myalgias.  Skin: Negative for rash.  Neurological: Negative for dizziness, weakness and headaches.  Hematological: Does not bruise/bleed easily.  Psychiatric/Behavioral: Negative.   All other systems reviewed and are negative.      Objective:   Physical Exam Vitals signs and nursing note reviewed.  Constitutional:      General: She is not in acute distress.    Appearance: Normal appearance. She is well-developed.  HENT:     Head: Normocephalic.     Nose: Nose normal.  Eyes:     Pupils: Pupils are equal, round, and reactive to light.  Neck:     Musculoskeletal: Normal range of motion and neck supple.     Vascular: No carotid bruit or JVD.  Cardiovascular:     Rate and Rhythm: Normal rate and regular rhythm.     Heart sounds: Normal heart sounds.  Pulmonary:     Effort: Pulmonary effort is normal. No respiratory distress.     Breath sounds: Normal breath sounds. No wheezing or rales.  Chest:     Chest wall: No tenderness.  Abdominal:     General: Bowel sounds are normal. There is no distension or abdominal bruit.     Palpations:  Abdomen is soft. There is no hepatomegaly, splenomegaly, mass or pulsatile mass.     Tenderness: There is no abdominal tenderness.  Musculoskeletal: Normal range of motion.  Lymphadenopathy:     Cervical: No cervical adenopathy.  Skin:    General: Skin is warm and dry.  Neurological:     Mental Status: She is alert and oriented to person, place, and time.     Deep Tendon Reflexes: Reflexes are normal and symmetric.  Psychiatric:        Behavior: Behavior normal.        Thought Content: Thought content normal.        Judgment: Judgment normal.    BP (!) 132/93   Pulse 84   Temp (!) 97.3 F (36.3 C) (Oral)   Ht 5' 8"  (1.727 m)   Wt 162 lb (73.5 kg)   BMI 24.63  kg/m         Assessment & Plan:  Shiya Fogelman comes in today with chief complaint of Medical Management of Chronic Issues (three month medication refill)   Diagnosis and orders addressed:  1. Essential hypertension Low sodium diet - benazepril-hydrochlorthiazide (LOTENSIN HCT) 20-12.5 MG tablet; TAKE (1) TABLET BY MOUTH ONCE DAILY.  Dispense: 90 tablet; Refill: 1  2. Hyperlipidemia with target LDL less than 100 Low fat diet - fenofibrate (TRICOR) 145 MG tablet; Take 1 tablet (145 mg total) by mouth daily.  Dispense: 90 tablet; Refill: 1  3. Metabolic syndrome Watch carbs in diet  4. Primary insomnia Bedtime routine - QUEtiapine (SEROQUEL) 100 MG tablet; Take 1 tablet (100 mg total) by mouth at bedtime.  Dispense: 90 tablet; Refill: 1  5. Recurrent major depressive disorder, in full remission ([place]) Stress management - ALPRAZolam (XANAX) 0.5 MG tablet; Take 1 tablet (0.5 mg total) by mouth 2 (two) times daily as needed for anxiety.  Dispense: 60 tablet; Refill: 3  6. Fibromyalgia Keep muscle warm - oxyCODONE (ROXICODONE) 5 MG immediate release tablet; Take 1 tablet (5 mg total) by mouth every 4 (four) hours as needed for up to 30 days for severe pain.  Dispense: 60 tablet; Refill: 0 - oxyCODONE (ROXICODONE)  5 MG immediate release tablet; Take 1 tablet (5 mg total) by mouth every 4 (four) hours as needed for up to 30 days for severe pain.  Dispense: 60 tablet; Refill: 0 - oxyCODONE (ROXICODONE) 5 MG immediate release tablet; Take 1 tablet (5 mg total) by mouth every 4 (four) hours as needed for up to 30 days for severe pain.  Dispense: 60 tablet; Refill: 0  7. ULCERATIVE COLITIS, LEFT SIDED Watch diet tat causes flare ups - dicyclomine (BENTYL) 20 MG tablet; Take 0.5-1 tablets (10-20 mg total) by mouth daily.  Dispense: 90 tablet; Refill: 1  8. CKD (chronic kidney disease) stage 3, GFR 30-59 ml/min (HCC) Labs pending   Labs pending Health Maintenance reviewed Diet and exercise encouraged  Follow up plan: 3 months   Mary-Margaret Hassell Done, FNP

## 2018-05-18 NOTE — Patient Instructions (Signed)
Health Maintenance After Age 77 After age 18, you are at a higher risk for certain long-term diseases and infections as well as injuries from falls. Falls are a major cause of broken bones and head injuries in people who are older than age 86. Getting regular preventive care can help to keep you healthy and well. Preventive care includes getting regular testing and making lifestyle changes as recommended by your health care provider. Talk with your health care provider about:  Which screenings and tests you should have. A screening is a test that checks for a disease when you have no symptoms.  A diet and exercise plan that is right for you. What should I know about screenings and tests to prevent falls? Screening and testing are the best ways to find a health problem early. Early diagnosis and treatment give you the best chance of managing medical conditions that are common after age 73. Certain conditions and lifestyle choices may make you more likely to have a fall. Your health care provider may recommend:  Regular vision checks. Poor vision and conditions such as cataracts can make you more likely to have a fall. If you wear glasses, make sure to get your prescription updated if your vision changes.  Medicine review. Work with your health care provider to regularly review all of the medicines you are taking, including over-the-counter medicines. Ask your health care provider about any side effects that may make you more likely to have a fall. Tell your health care provider if any medicines that you take make you feel dizzy or sleepy.  Osteoporosis screening. Osteoporosis is a condition that causes the bones to get weaker. This can make the bones weak and cause them to break more easily.  Blood pressure screening. Blood pressure changes and medicines to control blood pressure can make you feel dizzy.  Strength and balance checks. Your health care provider may recommend certain tests to check your  strength and balance while standing, walking, or changing positions.  Foot health exam. Foot pain and numbness, as well as not wearing proper footwear, can make you more likely to have a fall.  Depression screening. You may be more likely to have a fall if you have a fear of falling, feel emotionally low, or feel unable to do activities that you used to do.  Alcohol use screening. Using too much alcohol can affect your balance and may make you more likely to have a fall. What actions can I take to lower my risk of falls? General instructions  Talk with your health care provider about your risks for falling. Tell your health care provider if: ? You fall. Be sure to tell your health care provider about all falls, even ones that seem minor. ? You feel dizzy, sleepy, or off-balance.  Take over-the-counter and prescription medicines only as told by your health care provider. These include any supplements.  Eat a healthy diet and maintain a healthy weight. A healthy diet includes low-fat dairy products, low-fat (lean) meats, and fiber from whole grains, beans, and lots of fruits and vegetables. Home safety  Remove any tripping hazards, such as rugs, cords, and clutter.  Install safety equipment such as grab bars in bathrooms and safety rails on stairs.  Keep rooms and walkways well-lit. Activity   Follow a regular exercise program to stay fit. This will help you maintain your balance. Ask your health care provider what types of exercise are appropriate for you.  If you need a cane or  walker, use it as recommended by your health care provider.  Wear supportive shoes that have nonskid soles. Lifestyle  Do not drink alcohol if your health care provider tells you not to drink.  If you drink alcohol, limit how much you have: ? 0-1 drink a day for women. ? 0-2 drinks a day for men.  Be aware of how much alcohol is in your drink. In the [place] one drink equals one typical bottle of beer (12  oz), one-half glass of wine (5 oz), or one shot of hard liquor (1 oz).  Do not use any products that contain nicotine or tobacco, such as cigarettes and e-cigarettes. If you need help quitting, ask your health care provider. Summary  Having a healthy lifestyle and getting preventive care can help to protect your health and wellness after age 7.  Screening and testing are the best way to find a health problem early and help you avoid having a fall. Early diagnosis and treatment give you the best chance for managing medical conditions that are more common for people who are older than age 36.  Falls are a major cause of broken bones and head injuries in people who are older than age 9. Take precautions to prevent a fall at home.  Work with your health care provider to learn what changes you can make to improve your health and wellness and to prevent falls. This information is not intended to replace advice given to you by your health care provider. Make sure you discuss any questions you have with your health care provider. Document Released: 01/20/2017 Document Revised: 01/20/2017 Document Reviewed: 01/20/2017 Elsevier Interactive Patient Education  2019 [employer].

## 2018-05-18 NOTE — Addendum Note (Signed)
Addended by: [name] on: 05/18/2018 04:08 PM   Modules accepted: Orders

## 2018-05-19 LAB — CMP14+EGFR
ALBUMIN: 4.1 g/dL (ref 3.7–4.7)
ALK PHOS: 87 IU/L (ref 39–117)
ALT: 14 IU/L (ref 0–32)
AST: 20 IU/L (ref 0–40)
Albumin/Globulin Ratio: 1.6 (ref 1.2–2.2)
BILIRUBIN TOTAL: 0.5 mg/dL (ref 0.0–1.2)
BUN / CREAT RATIO: 14 (ref 12–28)
BUN: 17 mg/dL (ref 8–27)
CHLORIDE: 104 mmol/L (ref 96–106)
CO2: 22 mmol/L (ref 20–29)
CREATININE: 1.25 mg/dL — AB (ref 0.57–1.00)
Calcium: 9.1 mg/dL (ref 8.7–10.3)
GFR calc Af Amer: 48 mL/min/{1.73_m2} — ABNORMAL LOW (ref >59)
GFR calc non Af Amer: 42 mL/min/{1.73_m2} — ABNORMAL LOW (ref >59)
GLUCOSE: 117 mg/dL — AB (ref 65–99)
Globulin, Total: 2.6 g/dL (ref 1.5–4.5)
Potassium: 4.3 mmol/L (ref 3.5–5.2)
Sodium: 140 mmol/L (ref 134–144)
Total Protein: 6.7 g/dL (ref 6.0–8.5)

## 2018-05-19 LAB — LIPID PANEL
CHOLESTEROL TOTAL: 242 mg/dL — AB (ref 100–199)
Chol/HDL Ratio: 7.1 ratio — ABNORMAL HIGH (ref 0.0–4.4)
HDL: 34 mg/dL — ABNORMAL LOW (ref >39)
LDL Calculated: 137 mg/dL — ABNORMAL HIGH (ref 0–99)
TRIGLYCERIDES: 353 mg/dL — AB (ref 0–149)
VLDL CHOLESTEROL CAL: 71 mg/dL — AB (ref 5–40)

## 2018-05-20 ENCOUNTER — Other Ambulatory Visit: Payer: Self-pay | Admitting: *Deleted

## 2018-05-20 DIAGNOSIS — R110 Nausea: Secondary | ICD-10-CM

## 2018-05-20 MED ORDER — ROSUVASTATIN CALCIUM 10 MG PO TABS: 10.0000 mg | ORAL_TABLET | Freq: Every day | ORAL | 1 refills | Status: DC

## 2018-05-20 MED ORDER — ONDANSETRON HCL 4 MG PO TABS: ORAL_TABLET | ORAL | 1 refills | Status: DC

## 2018-05-20 NOTE — Addendum Note (Signed)
Addended by: [name] on: 05/20/2018 01:06 PM   Modules accepted: Orders

## 2018-08-19 ENCOUNTER — Other Ambulatory Visit: Payer: Self-pay

## 2018-08-22 ENCOUNTER — Ambulatory Visit: Payer: Medicare Other | Admitting: Nurse Practitioner

## 2018-08-22 ENCOUNTER — Encounter: Payer: Self-pay | Admitting: Nurse Practitioner

## 2018-08-22 ENCOUNTER — Other Ambulatory Visit: Payer: Self-pay

## 2018-08-22 VITALS — BP 140/90 | HR 86 | Temp 97.2°F | Ht 68.0 in | Wt 163.0 lb

## 2018-08-22 DIAGNOSIS — M797 Fibromyalgia: Secondary | ICD-10-CM

## 2018-08-22 DIAGNOSIS — E8881 Metabolic syndrome: Secondary | ICD-10-CM | POA: Diagnosis not present

## 2018-08-22 DIAGNOSIS — F3342 Major depressive disorder, recurrent, in full remission: Secondary | ICD-10-CM

## 2018-08-22 DIAGNOSIS — I10 Essential (primary) hypertension: Secondary | ICD-10-CM | POA: Diagnosis not present

## 2018-08-22 DIAGNOSIS — E785 Hyperlipidemia, unspecified: Secondary | ICD-10-CM | POA: Diagnosis not present

## 2018-08-22 DIAGNOSIS — N183 Chronic kidney disease, stage 3 unspecified: Secondary | ICD-10-CM

## 2018-08-22 DIAGNOSIS — F5101 Primary insomnia: Secondary | ICD-10-CM

## 2018-08-22 DIAGNOSIS — K5150 Left sided colitis without complications: Secondary | ICD-10-CM

## 2018-08-22 MED ORDER — QUETIAPINE FUMARATE 100 MG PO TABS: 100.0000 mg | ORAL_TABLET | Freq: Every day | ORAL | 1 refills | Status: DC

## 2018-08-22 MED ORDER — ALPRAZOLAM 0.5 MG PO TABS: 0.5000 mg | ORAL_TABLET | Freq: Two times a day (BID) | ORAL | 3 refills | Status: DC | PRN

## 2018-08-22 MED ORDER — OXYCODONE HCL 5 MG PO TABS: 5.0000 mg | ORAL_TABLET | ORAL | 0 refills | Status: DC | PRN

## 2018-08-22 MED ORDER — FENOFIBRATE 145 MG PO TABS: 145.0000 mg | ORAL_TABLET | Freq: Every day | ORAL | 1 refills | Status: DC

## 2018-08-22 MED ORDER — DICYCLOMINE HCL 20 MG PO TABS: 10.0000 mg | ORAL_TABLET | Freq: Every day | ORAL | 1 refills | Status: DC

## 2018-08-22 MED ORDER — BENAZEPRIL-HYDROCHLOROTHIAZIDE 20-12.5 MG PO TABS: ORAL_TABLET | ORAL | 1 refills | Status: DC

## 2018-08-22 MED ORDER — ROSUVASTATIN CALCIUM 10 MG PO TABS: 10.0000 mg | ORAL_TABLET | Freq: Every day | ORAL | 1 refills | Status: DC

## 2018-08-22 NOTE — Progress Notes (Signed)
Subjective:    Patient ID: [name], female    DOB: [birth year], 79 y.o.   MRN: [id number]   Chief Complaint: Medical Management of Chronic Issues    HPI:  1. Essential hypertension No c/o chest pain, sob or headaches. Does not check blood pressure at home. BP Readings from Last 3 Encounters:  08/22/18 140/90  05/18/18 (!) 132/93  02/15/18 129/88     2. Hyperlipidemia with target LDL less than 100 Watches fats in diet but does very little exercise.  3. Metabolic syndrome [name] does not check blood sugars at home. [name] has been eating lots of sweets Last hgba1c was 6.3%  4. CKD (chronic kidney disease) stage 3, GFR 30-59 ml/min (HCC) Last creatine level was 1.25 which was an improvement from 1.48 previously  5. ULCERATIVE COLITIS, LEFT SIDED Doing well right now. What [name] eats makes a difference in how [name] feels.  6. Fibromyalgia Hurts all the time. Rates pain today 5/10.  7. Recurrent major depressive disorder, in full remission ([place]) Currently is not on anything for depression and says [name] is doing well. Depression screen [facility] 2/9 08/22/2018 05/18/2018 02/15/2018  Decreased Interest 0 0 0  Down, Depressed, Hopeless 0 0 0  PHQ - 2 Score 0 0 0  Altered sleeping - - -  Tired, decreased energy - - -  Change in appetite - - -  Feeling bad or failure about yourself  - - -  Trouble concentrating - - -  Moving slowly or fidgety/restless - - -  Suicidal thoughts - - -  PHQ-9 Score - - -  Some recent data might be hidden     8. Primary insomnia Takes seroquel at night to sleep. Wakes up frequently, but is able to go back to sleep.  9. GAD Takes xanax BID- getting nervous or anxious makes [name] ulcerative colitis worse.   Outpatient Encounter Medications as of 08/22/2018  Medication Sig  . ALPRAZolam (XANAX) 0.5 MG tablet Take 1 tablet (0.5 mg total) by mouth 2 (two) times daily as needed for anxiety.  Marland Kitchen antiseptic oral rinse (BIOTENE) LIQD 1 application by Mouth Rinse  route daily. FOR DRY MOUTH  . benazepril-hydrochlorthiazide (LOTENSIN HCT) 20-12.5 MG tablet TAKE (1) TABLET BY MOUTH ONCE DAILY.  Marland Kitchen co-enzyme Q-10 50 MG capsule Take 50 mg by mouth once a week.   . dicyclomine (BENTYL) 20 MG tablet Take 0.5-1 tablets (10-20 mg total) by mouth daily.  . fenofibrate (TRICOR) 145 MG tablet Take 1 tablet (145 mg total) by mouth daily.  . hydrocortisone (ANUSOL-HC) 2.5 % rectal cream Place 1 application rectally 2 (two) times daily.  . Loperamide HCl (IMODIUM PO) Take 1 tablet by mouth daily as needed (diarrhea).  . loratadine-pseudoephedrine (CLARITIN-D 12-HOUR) 5-120 MG tablet Take 1 tablet by mouth every 12 (twelve) hours.  . Multiple Vitamins-Minerals (WOMENS MULTI VITAMIN & MINERAL PO) Take 1 tablet by mouth every other day.   . Omega-3 Fatty Acids (FISH OIL) 1000 MG CPDR Take 3 capsules by mouth daily.   . ondansetron (ZOFRAN) 4 MG tablet TAKE 1 TABLET TWICE DAILY AS NEEDED FOR NAUSEA AND VOMITING.  . OVER THE COUNTER MEDICATION Take 1 tablet by mouth daily. Vitamin K -2 Patient states that [name] takes daily with [name] Vitamin D.   . Probiotic Product (PROBIOTIC FORMULA PO) Take 1-2 capsules by mouth every morning.   Marland Kitchen QUEtiapine (SEROQUEL) 100 MG tablet Take 1 tablet (100 mg total) by mouth at bedtime.  . rosuvastatin (CRESTOR)  10 MG tablet Take 1 tablet (10 mg total) by mouth daily.  . shark liver oil-cocoa butter (PREPARATION H) 0.25-3-85.5 % suppository Place 1 suppository rectally as needed for hemorrhoids.  . vitamin E 400 UNIT capsule Take 400 Units by mouth every morning.   . [DISCONTINUED] polyethylene glycol (MIRALAX) packet Take 17 g by mouth daily.  Marland Kitchen oxyCODONE (ROXICODONE) 5 MG immediate release tablet Take 1 tablet (5 mg total) by mouth every 4 (four) hours as needed for up to 30 days for severe pain.  Marland Kitchen oxyCODONE (ROXICODONE) 5 MG immediate release tablet Take 1 tablet (5 mg total) by mouth every 4 (four) hours as needed for up to 30 days for severe  pain.  Marland Kitchen oxyCODONE (ROXICODONE) 5 MG immediate release tablet Take 1 tablet (5 mg total) by mouth every 4 (four) hours as needed for up to 30 days for severe pain.  . [DISCONTINUED] sodium chloride (OCEAN) 0.65 % SOLN nasal spray Place 2 sprays into the nose as needed for congestion.    New complaints: None today  Social history: Lives alone     Review of Systems  Constitutional: Negative for activity change and appetite change.  HENT: Negative.   Eyes: Negative for pain.  Respiratory: Negative for shortness of breath.   Cardiovascular: Negative for chest pain, palpitations and leg swelling.  Gastrointestinal: Positive for abdominal pain, constipation and diarrhea.  Endocrine: Negative for polydipsia.  Genitourinary: Negative.   Musculoskeletal: Positive for arthralgias and myalgias.  Skin: Negative for rash.  Neurological: Negative for dizziness, weakness and headaches.  Hematological: Does not bruise/bleed easily.  Psychiatric/Behavioral: Negative.   All other systems reviewed and are negative.      Objective:   Physical Exam Vitals signs and nursing note reviewed.  Constitutional:      General: [name] is not in acute distress.    Appearance: Normal appearance. [name] is well-developed.  HENT:     Head: Normocephalic.     Nose: Nose normal.  Eyes:     Pupils: Pupils are equal, round, and reactive to light.  Neck:     Musculoskeletal: Normal range of motion and neck supple.     Vascular: No carotid bruit or JVD.  Cardiovascular:     Rate and Rhythm: Normal rate and regular rhythm.     Heart sounds: Normal heart sounds.  Pulmonary:     Effort: Pulmonary effort is normal. No respiratory distress.     Breath sounds: Normal breath sounds. No wheezing or rales.  Chest:     Chest wall: No tenderness.  Abdominal:     General: Bowel sounds are normal. There is no distension or abdominal bruit.     Palpations: Abdomen is soft. There is no hepatomegaly, splenomegaly, mass or  pulsatile mass.     Tenderness: There is abdominal tenderness (diffuse).  Musculoskeletal: Normal range of motion.  Lymphadenopathy:     Cervical: No cervical adenopathy.  Skin:    General: Skin is warm and dry.  Neurological:     Mental Status: [name] is alert and oriented to person, place, and time.     Deep Tendon Reflexes: Reflexes are normal and symmetric.  Psychiatric:        Behavior: Behavior normal.        Thought Content: Thought content normal.        Judgment: Judgment normal.     BP 140/90   Pulse 86   Temp (!) 97.2 F (36.2 C) (Oral)   Ht 5' 8"  (1.727  m)   Wt 163 lb (73.9 kg)   BMI 24.78 kg/m        Assessment & Plan:  Shonya Sumida comes in today with chief complaint of Medical Management of Chronic Issues   Diagnosis and orders addressed:  1. Essential hypertension Low sodium diet - benazepril-hydrochlorthiazide (LOTENSIN HCT) 20-12.5 MG tablet; TAKE (1) TABLET BY MOUTH ONCE DAILY.  Dispense: 90 tablet; Refill: 1  2. Hyperlipidemia with target LDL less than 100 Low fat diet - fenofibrate (TRICOR) 145 MG tablet; Take 1 tablet (145 mg total) by mouth daily.  Dispense: 90 tablet; Refill: 1  3. Metabolic syndrome Watch carbs in diet  4. CKD (chronic kidney disease) stage 3, GFR 30-59 ml/min (HCC) Labs pending  5. ULCERATIVE COLITIS, LEFT SIDED Watch diet and avoid foods that irritate colitis - dicyclomine (BENTYL) 20 MG tablet; Take 0.5-1 tablets (10-20 mg total) by mouth daily.  Dispense: 90 tablet; Refill: 1  6. Fibromyalgia Keep muscles warm - oxyCODONE (ROXICODONE) 5 MG immediate release tablet; Take 1 tablet (5 mg total) by mouth every 4 (four) hours as needed for up to 30 days for severe pain.  Dispense: 60 tablet; Refill: 0 - oxyCODONE (ROXICODONE) 5 MG immediate release tablet; Take 1 tablet (5 mg total) by mouth every 4 (four) hours as needed for up to 30 days for severe pain.  Dispense: 60 tablet; Refill: 0 - oxyCODONE (ROXICODONE) 5 MG  immediate release tablet; Take 1 tablet (5 mg total) by mouth every 4 (four) hours as needed for up to 30 days for severe pain.  Dispense: 60 tablet; Refill: 0  7. Recurrent major depressive disorder, in full remission ([place]) Stress management - ALPRAZolam (XANAX) 0.5 MG tablet; Take 1 tablet (0.5 mg total) by mouth 2 (two) times daily as needed for anxiety.  Dispense: 60 tablet; Refill: 3  8. Primary insomnia Bedtime routine - QUEtiapine (SEROQUEL) 100 MG tablet; Take 1 tablet (100 mg total) by mouth at bedtime.  Dispense: 90 tablet; Refill: 1   Labs pending Health Maintenance reviewed Diet and exercise encouraged  Follow up plan: 3 months   Mary-Margaret Hassell Done, FNP

## 2018-08-22 NOTE — Patient Instructions (Signed)

## 2018-08-22 NOTE — Addendum Note (Signed)
Addended by: Rolena Infante on: 08/22/2018 03:20 PM   Modules accepted: Orders

## 2018-08-25 ENCOUNTER — Other Ambulatory Visit: Payer: Self-pay

## 2018-08-25 ENCOUNTER — Ambulatory Visit (INDEPENDENT_AMBULATORY_CARE_PROVIDER_SITE_OTHER): Payer: Medicare Other | Admitting: *Deleted

## 2018-08-25 VITALS — BP 140/90 | HR 86 | Ht 68.0 in | Wt 163.0 lb

## 2018-08-25 DIAGNOSIS — Z0000 Encounter for general adult medical examination without abnormal findings: Secondary | ICD-10-CM | POA: Diagnosis not present

## 2018-08-25 NOTE — Progress Notes (Addendum)
MEDICARE ANNUAL WELLNESS VISIT  08/25/2018  Telephone Visit Disclaimer This Medicare AWV was conducted by telephone due to national recommendations for restrictions regarding the COVID-19 Pandemic (e.g. social distancing).  I verified, using two identifiers, that I am speaking with [name] or their authorized healthcare agent. I discussed the limitations, risks, security, and privacy concerns of performing an evaluation and management service by telephone and the potential availability of an in-person appointment in the future. The patient expressed understanding and agreed to proceed.   Subjective:  [name] is a 78 y.o. female patient of [name], [name] who had a Medicare Annual Wellness Visit today via telephone. [name] is Retired and lives alone. she has no children. she reports that she is socially active and does interact with friends/family regularly. she is minimally physically active and enjoys gardening.  Patient Care Team: [name] Pretty, FNP as PCP - General (Nurse Practitioner)  Advanced Directives 08/25/2018 10/13/2017 09/03/2017 07/28/2017 04/15/2017 05/30/2015 12/28/2014  Does Patient Have a Medical Advance Directive? Yes No;Yes Yes Yes No Yes No  Type of Advance Directive Living will;Healthcare Power of [place];Living will Healthcare Power of [place];Living will - [place];Living will -  Does patient want to make changes to medical advance directive? - No - Patient declined - - - - -  Copy of [place] in Chart? No - copy requested No - copy requested No - copy requested No - copy requested - - -  Would patient like information on creating a medical advance directive? - No - Patient declined - No - Patient declined No - Patient declined - -  Pre-existing out of facility DNR order (yellow form or pink MOST form) - - - - - - -    Hospital Utilization Over the  Past 12 Months: # of hospitalizations or ER visits: 1 # of surgeries: 0  Review of Systems    Patient reports that her overall health is better compared to last year.  Patient Reported Readings (BP, Pulse, CBG, Weight, etc) BP 140/90   Pulse 86   Ht 5' 8"  (1.727 m)   Wt 163 lb (73.9 kg)   BMI 24.78 kg/m    Review of Systems: General ROS: negative  All other systems negative.  Pain Assessment       Current Medications & Allergies (verified) Allergies as of 08/25/2018      Reactions   Azathioprine Nausea And Vomiting, Other (See Comments)   SEVERE NAUSEA AND VOMITING WITH CHEST PAIN-  PATIENT INSISTS NEVER TO BE GIVEN ANYTHING SIMILAR TO THIS   Tape Other (See Comments), Rash   BLISTERS AND SKIN TEARING   Codeine Nausea And Vomiting   Lac Bovis Nausea And Vomiting   Penicillins Hives   Has patient had a PCN reaction causing immediate rash, facial/tongue/throat swelling, SOB or lightheadedness with hypotension: No Has patient had a PCN reaction causing severe rash involving mucus membranes or skin necrosis: No Has patient had a PCN reaction that required hospitalization: No Has patient had a PCN reaction occurring within the last 10 years: No If all of the above answers are "NO", then may proceed with Cephalosporin use.   Povidone-iodine Hives   BETADINE   Procaine Hcl Other (See Comments)   SEVERE GI UPSET (NAUSEA,VOMITING AND DIARRHEA)   Sulfonamide Derivatives Hives   Tramadol Itching   Acyclovir And Related    Azithromycin    Mycins  Clonazepam Other (See Comments)   Skin burning, insomnia, diarrhea.   Dairy Aid [lactase]    Doxycycline Rash   Latex Rash   Niacin And Related Rash      Medication List       Accurate as of August 25, 2018  2:31 PM. If you have any questions, ask your nurse or doctor.        ALPRAZolam 0.5 MG tablet Commonly known as:  XANAX Take 1 tablet (0.5 mg total) by mouth 2 (two) times daily as needed for anxiety.   antiseptic  oral rinse Liqd 1 application by Mouth Rinse route daily. FOR DRY MOUTH   benazepril-hydrochlorthiazide 20-12.5 MG tablet Commonly known as:  LOTENSIN HCT TAKE (1) TABLET BY MOUTH ONCE DAILY.   co-enzyme Q-10 50 MG capsule Take 50 mg by mouth once a week.   dicyclomine 20 MG tablet Commonly known as:  BENTYL Take 0.5-1 tablets (10-20 mg total) by mouth daily.   fenofibrate 145 MG tablet Commonly known as:  TRICOR Take 1 tablet (145 mg total) by mouth daily.   Fish Oil 1000 MG Cpdr Take 3 capsules by mouth daily.   hydrocortisone 2.5 % rectal cream Commonly known as:  ANUSOL-HC Place 1 application rectally 2 (two) times daily.   IMODIUM PO Take 1 tablet by mouth daily as needed (diarrhea).   loratadine-pseudoephedrine 5-120 MG tablet Commonly known as:  CLARITIN-D 12-hour Take 1 tablet by mouth every 12 (twelve) hours.   ondansetron 4 MG tablet Commonly known as:  ZOFRAN TAKE 1 TABLET TWICE DAILY AS NEEDED FOR NAUSEA AND VOMITING.   OVER THE COUNTER MEDICATION Take 1 tablet by mouth daily. Vitamin K -2 Patient states that she takes daily with her Vitamin D.   oxyCODONE 5 MG immediate release tablet Commonly known as:  Roxicodone Take 1 tablet (5 mg total) by mouth every 4 (four) hours as needed for up to 30 days for severe pain.   oxyCODONE 5 MG immediate release tablet Commonly known as:  Roxicodone Take 1 tablet (5 mg total) by mouth every 4 (four) hours as needed for up to 30 days for severe pain. Start taking on:  September 21, 2018   oxyCODONE 5 MG immediate release tablet Commonly known as:  Roxicodone Take 1 tablet (5 mg total) by mouth every 4 (four) hours as needed for up to 30 days for severe pain. Start taking on:  October 21, 2018   PROBIOTIC FORMULA PO Take 1-2 capsules by mouth every morning.   QUEtiapine 100 MG tablet Commonly known as:  SEROquel Take 1 tablet (100 mg total) by mouth at bedtime.   rosuvastatin 10 MG tablet Commonly known as:  Crestor  Take 1 tablet (10 mg total) by mouth daily.   shark liver oil-cocoa butter 0.25-3-85.5 % suppository Commonly known as:  PREPARATION H Place 1 suppository rectally as needed for hemorrhoids.   vitamin E 400 UNIT capsule Take 400 Units by mouth every morning.   WOMENS MULTI VITAMIN & MINERAL PO Take 1 tablet by mouth every other day.       History (reviewed): Past Medical History:  Diagnosis Date  . Anemia due to blood loss 05/02/2011  . Anxiety   . Arthritis    rheumatoid   . Collagen vascular disease ([place])    ra  . Fibromyalgia   . Hyperglycemia, drug-induced 05/02/2011  . Hypertension   . Ulcerative colitis    Past Surgical History:  Procedure Laterality Date  . ABDOMINAL HYSTERECTOMY    .  BIOPSY N/A 12/28/2014   Procedure: SIGMOID AND RECTAL BIOPSIES;  Surgeon: Rogene Houston, MD;  Location: [facility];  Service: Endoscopy;  Laterality: N/A;  . CHOLECYSTECTOMY     s/p  . COLONOSCOPY  06/16/06. 04/27/2007   proctitis seen, biopsies showed chronic active colitis, no dysplasia   . EYE SURGERY     clear lense extraction 1998   . FLEXIBLE SIGMOIDOSCOPY N/A 12/28/2014   Procedure: FLEXIBLE SIGMOIDOSCOPY WITH PROPOFOL;  Surgeon: Rogene Houston, MD;  Location: [facility];  Service: Endoscopy;  Laterality: N/A;   Family History  Problem Relation Age of Onset  . Heart attack Mother 13       Died in her sleep  . Diabetes Mother   . Heart attack Father 49       Died in her sleep  . Heart attack Brother 9       Defib, CABG  . Diabetes Brother   . Kidney disease Brother        dyalasis   . Cancer Sister        liver, breast   . Heart disease Sister    Social History   Socioeconomic History  . Marital status: Widowed    Spouse name: Not on file  . Number of children: 0  . Years of education: Not on file  . Highest education level: Not on file  Occupational History  . Occupation: retired   Scientific laboratory technician  . Financial resource strain: Not on file  . Food insecurity:     Worry: Not on file    Inability: Not on file  . Transportation needs:    Medical: Not on file    Non-medical: Not on file  Tobacco Use  . Smoking status: Never Smoker  . Smokeless tobacco: Never Used  Substance and Sexual Activity  . Alcohol use: Never    Alcohol/week: 0.0 standard drinks    Frequency: Never    Comment: Ocassionally a small glass of wine. Patient states that is has been 2 months since she drank anything.  . Drug use: No  . Sexual activity: Never  Lifestyle  . Physical activity:    Days per week: Not on file    Minutes per session: Not on file  . Stress: Not on file  Relationships  . Social connections:    Talks on phone: Not on file    Gets together: Not on file    Attends religious service: Not on file    Active member of club or organization: Not on file    Attends meetings of clubs or organizations: Not on file    Relationship status: Not on file  Other Topics Concern  . Not on file  Social History Narrative   Lives alone.  3  step children living     Activities of Daily Living In your present state of health, do you have any difficulty performing the following activities: 08/25/2018  Hearing? N  Vision? N  Difficulty concentrating or making decisions? N  Walking or climbing stairs? N  Dressing or bathing? N  Doing errands, shopping? N  Preparing Food and eating ? N  Using the Toilet? N  In the past six months, have you accidently leaked urine? N  Do you have problems with loss of bowel control? N  Managing your Medications? N  Managing your Finances? N  Housekeeping or managing your Housekeeping? N  Some recent data might be hidden    Patient Literacy    Exercise  Current Exercise Habits: Home exercise routine, Type of exercise: walking, Time (Minutes): 30, Frequency (Times/Week): 4, Weekly Exercise (Minutes/Week): 120, Intensity: Mild, Exercise limited by: None identified  Diet Patient reports consuming 2 meals a day and 1 snack(s) a day  Patient reports that her primary diet is: Regular Patient reports that she does not have regular access to food.   Depression Screen PHQ 2/9 Scores 08/25/2018 08/22/2018 05/18/2018 02/15/2018 11/15/2017 10/05/2017 09/18/2017  PHQ - 2 Score 0 0 0 0 0 0 0  PHQ- 9 Score - - - - - - -     Fall Risk Fall Risk  08/25/2018 08/22/2018 05/18/2018 02/15/2018 11/15/2017  Falls in the past year? 1 0 0 1 No  Number falls in past yr: 0 - - 1 -  Injury with Fall? 0 - - 0 -     Objective:  [name] seemed alert and oriented and she participated appropriately during our telephone visit.  Blood Pressure Weight BMI  BP Readings from Last 3 Encounters:  08/25/18 140/90  08/22/18 140/90  05/18/18 (!) 132/93   Wt Readings from Last 3 Encounters:  08/25/18 163 lb (73.9 kg)  08/22/18 163 lb (73.9 kg)  05/18/18 162 lb (73.5 kg)   BMI Readings from Last 1 Encounters:  08/25/18 24.78 kg/m    *Unable to obtain current vital signs, weight, and BMI due to telephone visit type  Hearing/Vision  . [place] did not seem to have difficulty with hearing/understanding during the telephone conversation . Reports that she has had a formal eye exam by an eye care professional within the past year . Reports that she has not had a formal hearing evaluation within the past year *Unable to fully assess hearing and vision during telephone visit type  Cognitive Function: 6CIT Screen 08/25/2018  What Year? 0 points  What month? 0 points  What time? 0 points  Repeat phrase 4 points    Normal Cognitive Function Screening: Yes (Normal:0-7, Significant for Dysfunction: >8)  Immunization & Health Maintenance Record Immunization History  Administered Date(s) Administered  . Hepatitis B 09/13/1990, 10/14/1990, 03/29/1991  . Influenza, High Dose Seasonal PF 12/18/2016  . Influenza,inj,Quad PF,6+ Mos 01/04/2015, 02/06/2016  . Influenza,inj,quad, With Preservative 01/17/2014  . Influenza-Unspecified 12/24/2009  . PPD Test  03/13/2013  . Pneumococcal Conjugate-13 01/29/2015  . Pneumococcal Polysaccharide-23 03/24/2011  . Tdap 10/20/2012    Health Maintenance  Topic Date Due  . OPHTHALMOLOGY EXAM  02/17/2014  . FOOT EXAM  02/09/2018  . MAMMOGRAM  06/29/2018  . INFLUENZA VACCINE  10/22/2018  . HEMOGLOBIN A1C  11/16/2018  . TETANUS/TDAP  10/21/2022  . DEXA SCAN  Completed  . PNA vac Low Risk Adult  Completed       Assessment  This is a routine wellness examination for [employer].  Health Maintenance: Due or Overdue Health Maintenance Due  Topic Date Due  . OPHTHALMOLOGY EXAM  02/17/2014  . FOOT EXAM  02/09/2018  . MAMMOGRAM  06/29/2018    [name] does not need a referral for Community Assistance: Care Management:   no Social Work:    no Prescription Assistance:  no Nutrition/Diabetes Education:  no   Plan:  Personalized Goals Goals Addressed            This Visit's Progress   . Prevent falls       Stay active Work on house and yard      [place] Recommendations  up to date - declined shingrix  Lung Cancer Screening Recommended: no (Low Dose CT Chest recommended if Age 75-80 years, 30 pack-year currently smoking OR have quit w/in past 15 years) Hepatitis C Screening recommended: no HIV Screening recommended: no  Advanced Directives: Written information was not prepared per patient's request.  Referrals & Orders No orders of the defined types were placed in this encounter.   Follow-up Plan . Follow-up with [name] Pretty, FNP as planned    I have personally reviewed and noted the following in the patient's chart:   . Medical and social history . Use of alcohol, tobacco or illicit drugs  . Current medications and supplements . Functional ability and status . Nutritional status . Physical activity . Advanced directives . List of other physicians . Hospitalizations, surgeries, and ER visits in previous 12 months .  Vitals . Screenings to include cognitive, depression, and falls . Referrals and appointments  In addition, I have reviewed and discussed with [name] certain preventive protocols, quality metrics, and best practice recommendations. A written personalized care plan for preventive services as well as general preventive health recommendations is available and can be mailed to the patient at her request.      , Cameron Proud  08/25/2018  I have reviewed and agree with the above AWV documentation.   Mary-Margaret Hassell Done, FNP

## 2018-08-25 NOTE — Patient Instructions (Signed)
  [name] , Thank you for taking time to come for your Medicare Wellness Visit. I appreciate your ongoing commitment to your health goals. Please review the following plan we discussed and let me know if I can assist you in the future.   These are the goals we discussed: Goals    . Prevent falls     Stay active Work on house and yard       This is a list of the screening recommended for you and due dates:  Health Maintenance  Topic Date Due  . Eye exam for diabetics  02/17/2014  . Complete foot exam   02/09/2018  . Mammogram  06/29/2018  . Flu Shot  10/22/2018  . Hemoglobin A1C  11/16/2018  . Tetanus Vaccine  10/21/2022  . DEXA scan (bone density measurement)  Completed  . Pneumonia vaccines  Completed

## 2018-08-29 LAB — TOXASSURE SELECT 13 (MW), URINE

## 2018-11-18 ENCOUNTER — Other Ambulatory Visit: Payer: Self-pay

## 2018-11-22 ENCOUNTER — Ambulatory Visit (INDEPENDENT_AMBULATORY_CARE_PROVIDER_SITE_OTHER): Payer: Medicare Other | Admitting: Nurse Practitioner

## 2018-11-22 ENCOUNTER — Encounter: Payer: Self-pay | Admitting: Nurse Practitioner

## 2018-11-22 DIAGNOSIS — I10 Essential (primary) hypertension: Secondary | ICD-10-CM | POA: Diagnosis not present

## 2018-11-22 DIAGNOSIS — F3342 Major depressive disorder, recurrent, in full remission: Secondary | ICD-10-CM

## 2018-11-22 DIAGNOSIS — N183 Chronic kidney disease, stage 3 unspecified: Secondary | ICD-10-CM

## 2018-11-22 DIAGNOSIS — R110 Nausea: Secondary | ICD-10-CM

## 2018-11-22 DIAGNOSIS — E8881 Metabolic syndrome: Secondary | ICD-10-CM | POA: Diagnosis not present

## 2018-11-22 DIAGNOSIS — E785 Hyperlipidemia, unspecified: Secondary | ICD-10-CM

## 2018-11-22 DIAGNOSIS — K5150 Left sided colitis without complications: Secondary | ICD-10-CM

## 2018-11-22 DIAGNOSIS — F5101 Primary insomnia: Secondary | ICD-10-CM

## 2018-11-22 DIAGNOSIS — M797 Fibromyalgia: Secondary | ICD-10-CM

## 2018-11-22 MED ORDER — DICYCLOMINE HCL 20 MG PO TABS: 10.0000 mg | ORAL_TABLET | Freq: Every day | ORAL | 1 refills | Status: DC

## 2018-11-22 MED ORDER — ONDANSETRON HCL 4 MG PO TABS: ORAL_TABLET | ORAL | 1 refills | Status: DC

## 2018-11-22 MED ORDER — QUETIAPINE FUMARATE 100 MG PO TABS: 100.0000 mg | ORAL_TABLET | Freq: Every day | ORAL | 1 refills | Status: DC

## 2018-11-22 MED ORDER — OXYCODONE HCL 5 MG PO TABS: 5.0000 mg | ORAL_TABLET | Freq: Two times a day (BID) | ORAL | 0 refills | Status: DC

## 2018-11-22 MED ORDER — ALPRAZOLAM 0.5 MG PO TABS: 0.5000 mg | ORAL_TABLET | Freq: Two times a day (BID) | ORAL | 3 refills | Status: DC | PRN

## 2018-11-22 MED ORDER — FENOFIBRATE 145 MG PO TABS: 145.0000 mg | ORAL_TABLET | Freq: Every day | ORAL | 1 refills | Status: DC

## 2018-11-22 MED ORDER — HYDROCORTISONE ACETATE 25 MG RE SUPP: 25.0000 mg | Freq: Two times a day (BID) | RECTAL | 0 refills | Status: DC

## 2018-11-22 MED ORDER — BENAZEPRIL-HYDROCHLOROTHIAZIDE 20-12.5 MG PO TABS: ORAL_TABLET | ORAL | 1 refills | Status: DC

## 2018-11-22 MED ORDER — ROSUVASTATIN CALCIUM 10 MG PO TABS: 10.0000 mg | ORAL_TABLET | Freq: Every day | ORAL | 1 refills | Status: DC

## 2018-11-22 NOTE — Progress Notes (Signed)
Virtual Visit via telephone Note Due to COVID-19 pandemic this visit was conducted virtually. This visit type was conducted due to national recommendations for restrictions regarding the COVID-19 Pandemic (e.g. social distancing, sheltering in place) in an effort to limit this patient's exposure and mitigate transmission in our community. All issues noted in this document were discussed and addressed.  A physical exam was not performed with this format.  I connected with [name] on 11/22/18 at 2:50 by telephone and verified that I am speaking with the correct person using two identifiers. [name] is currently located at home and  No one is currently with her during visit. The provider, Mary-Margaret Hassell Done, FNP is located in their office at time of visit.  I discussed the limitations, risks, security and privacy concerns of performing an evaluation and management service by telephone and the availability of in person appointments. I also discussed with the patient that there may be a patient responsible charge related to this service. The patient expressed understanding and agreed to proceed.   History and Present Illness:   Chief Complaint: Medical Management of Chronic Issues    HPI:  1. Essential hypertension No c/o chest pain, sob or headache. Does not check blood pressure at home. BP Readings from Last 3 Encounters:  08/25/18 140/90  08/22/18 140/90  05/18/18 (!) 132/93     2. CKD (chronic kidney disease) stage 3, GFR 30-59 ml/min (HCC) Lab Results  Component Value Date   CREATININE 1.25 (H) 05/18/2018     3. Hyperlipidemia with target LDL less than 100 Does not eat a lot of fried foods. Takes crestor daily. Lab Results  Component Value Date   CHOL 242 (H) 05/18/2018   HDL 34 (L) 05/18/2018   LDLCALC 137 (H) 05/18/2018   TRIG 353 (H) 05/18/2018   CHOLHDL 7.1 (H) 05/18/2018     4. Metabolic syndrome Doe snot check blood psugars at home. Lab Results   Component Value Date   HGBA1C 6.3 05/18/2018     5. Recurrent major depressive disorder, in full remission ([place]) Takes xanax dialy. We have discussed her coming offof it because of her pain meds. She says she is nit able ot do that  6. Fibromyalgia Has muscle aches all the time. Most of her pressure ponts are in her back.  7. Primary insomnia Uses seroquel to sleep at night . Says that she rests well.  8. ULCERATIVE COLITIS, LEFT SIDED Takes n=bentyl but still have bad cramps. She is on oxycodone 51m BID. This makes her pain tolerable. She has beenon this routine for many years.    Outpatient Encounter Medications as of 11/22/2018  Medication Sig  . ALPRAZolam (XANAX) 0.5 MG tablet Take 1 tablet (0.5 mg total) by mouth 2 (two) times daily as needed for anxiety.  .Marland Kitchenantiseptic oral rinse (BIOTENE) LIQD 1 application by Mouth Rinse route daily. FOR DRY MOUTH  . benazepril-hydrochlorthiazide (LOTENSIN HCT) 20-12.5 MG tablet TAKE (1) TABLET BY MOUTH ONCE DAILY.  .Marland Kitchenco-enzyme Q-10 50 MG capsule Take 50 mg by mouth once a week.   . dicyclomine (BENTYL) 20 MG tablet Take 0.5-1 tablets (10-20 mg total) by mouth daily.  . fenofibrate (TRICOR) 145 MG tablet Take 1 tablet (145 mg total) by mouth daily.  . hydrocortisone (ANUSOL-HC) 2.5 % rectal cream Place 1 application rectally 2 (two) times daily. (Patient not taking: Reported on 08/25/2018)  . Loperamide HCl (IMODIUM PO) Take 1 tablet by mouth daily as needed (diarrhea).  .Marland Kitchen  loratadine-pseudoephedrine (CLARITIN-D 12-HOUR) 5-120 MG tablet Take 1 tablet by mouth every 12 (twelve) hours.  . Multiple Vitamins-Minerals (WOMENS MULTI VITAMIN & MINERAL PO) Take 1 tablet by mouth every other day.   . Omega-3 Fatty Acids (FISH OIL) 1000 MG CPDR Take 3 capsules by mouth daily.   . ondansetron (ZOFRAN) 4 MG tablet TAKE 1 TABLET TWICE DAILY AS NEEDED FOR NAUSEA AND VOMITING. (Patient not taking: Reported on 08/25/2018)  . OVER THE COUNTER MEDICATION Take 1  tablet by mouth daily. Vitamin K -2 Patient states that she takes daily with her Vitamin D.   . oxyCODONE (ROXICODONE) 5 MG immediate release tablet Take 1 tablet (5 mg total) by mouth every 4 (four) hours as needed for up to 30 days for severe pain.  Marland Kitchen oxyCODONE (ROXICODONE) 5 MG immediate release tablet Take 1 tablet (5 mg total) by mouth every 4 (four) hours as needed for up to 30 days for severe pain. (Patient not taking: Reported on 08/25/2018)  . oxyCODONE (ROXICODONE) 5 MG immediate release tablet Take 1 tablet (5 mg total) by mouth every 4 (four) hours as needed for up to 30 days for severe pain. (Patient not taking: Reported on 08/25/2018)  . Probiotic Product (PROBIOTIC FORMULA PO) Take 1-2 capsules by mouth every morning.   Marland Kitchen QUEtiapine (SEROQUEL) 100 MG tablet Take 1 tablet (100 mg total) by mouth at bedtime.  . rosuvastatin (CRESTOR) 10 MG tablet Take 1 tablet (10 mg total) by mouth daily.  . shark liver oil-cocoa butter (PREPARATION H) 0.25-3-85.5 % suppository Place 1 suppository rectally as needed for hemorrhoids.  . vitamin E 400 UNIT capsule Take 400 Units by mouth every morning.      Past Surgical History:  Procedure Laterality Date  . ABDOMINAL HYSTERECTOMY    . BIOPSY N/A 12/28/2014   Procedure: SIGMOID AND RECTAL BIOPSIES;  Surgeon: Rogene Houston, MD;  Location: [facility];  Service: Endoscopy;  Laterality: N/A;  . CHOLECYSTECTOMY     s/p  . COLONOSCOPY  06/16/06. 04/27/2007   proctitis seen, biopsies showed chronic active colitis, no dysplasia   . EYE SURGERY     clear lense extraction 1998   . FLEXIBLE SIGMOIDOSCOPY N/A 12/28/2014   Procedure: FLEXIBLE SIGMOIDOSCOPY WITH PROPOFOL;  Surgeon: Rogene Houston, MD;  Location: [facility];  Service: Endoscopy;  Laterality: N/A;    Family History  Problem Relation Age of Onset  . Heart attack Mother 74       Died in her sleep  . Diabetes Mother   . Heart attack Father 40       Died in her sleep  . Heart attack Brother 23        Defib, CABG  . Diabetes Brother   . Kidney disease Brother        dyalasis   . Cancer Sister        liver, breast   . Heart disease Sister     New complaints: None today  Social history: Lives by herself  Controlled substance contract: 11/16/17    Review of Systems  Constitutional: Negative for diaphoresis and weight loss.  Eyes: Negative for blurred vision, double vision and pain.  Respiratory: Negative for shortness of breath.   Cardiovascular: Negative for chest pain, palpitations, orthopnea and leg swelling.  Gastrointestinal: Positive for abdominal pain and nausea.  Musculoskeletal: Positive for myalgias.  Skin: Negative for rash.  Neurological: Negative for dizziness, sensory change, loss of consciousness, weakness and headaches.  Endo/Heme/Allergies: Negative for  polydipsia. Does not bruise/bleed easily.  Psychiatric/Behavioral: Negative for memory loss. The patient does not have insomnia.   All other systems reviewed and are negative.    Observations/Objective: Alert and oriented- answers all questions appropriately No distress    Assessment and Plan: [name] comes in today with chief complaint of Medical Management of Chronic Issues   Diagnosis and orders addressed:  1. Essential hypertension Low sodium diet - benazepril-hydrochlorthiazide (LOTENSIN HCT) 20-12.5 MG tablet; TAKE (1) TABLET BY MOUTH ONCE DAILY.  Dispense: 90 tablet; Refill: 1  2. CKD (chronic kidney disease) stage 3, GFR 30-59 ml/min (HCC) Will repeat labs at next visit  3. Hyperlipidemia with target LDL less than 100 Low fat diet - fenofibrate (TRICOR) 145 MG tablet; Take 1 tablet (145 mg total) by mouth daily.  Dispense: 90 tablet; Refill: 1  4. Metabolic syndrome Watch carbs in diet  5. Recurrent major depressive disorder, in full remission ([place]) Stress management - ALPRAZolam (XANAX) 0.5 MG tablet; Take 1 tablet (0.5 mg total) by mouth 2 (two) times daily as needed for  anxiety.  Dispense: 60 tablet; Refill: 3  6. Fibromyalgia Exercise and stay active - oxyCODONE (ROXICODONE) 5 MG immediate release tablet; Take 1 tablet (5 mg total) by mouth 2 (two) times daily.  Dispense: 60 tablet; Refill: 0 - oxyCODONE (ROXICODONE) 5 MG immediate release tablet; Take 1 tablet (5 mg total) by mouth 2 (two) times daily.  Dispense: 60 tablet; Refill: 0 - oxyCODONE (ROXICODONE) 5 MG immediate release tablet; Take 1 tablet (5 mg total) by mouth 2 (two) times daily.  Dispense: 60 tablet; Refill: 0  7. Primary insomnia Bedtime routine - QUEtiapine (SEROQUEL) 100 MG tablet; Take 1 tablet (100 mg total) by mouth at bedtime.  Dispense: 90 tablet; Refill: 1  8. ULCERATIVE COLITIS, LEFT SIDED - dicyclomine (BENTYL) 20 MG tablet; Take 0.5-1 tablets (10-20 mg total) by mouth daily.  Dispense: 90 tablet; Refill: 1  9. Nausea without vomiting - ondansetron (ZOFRAN) 4 MG tablet; TAKE 1 TABLET TWICE DAILY AS NEEDED FOR NAUSEA AND VOMITING.  Dispense: 20 tablet; Refill: 1   Labs pending Health Maintenance reviewed Diet and exercise encouraged  Follow up plan: 3 months     I discussed the assessment and treatment plan with the patient. The patient was provided an opportunity to ask questions and all were answered. The patient agreed with the plan and demonstrated an understanding of the instructions.   The patient was advised to call back or seek an in-person evaluation if the symptoms worsen or if the condition fails to improve as anticipated.  The above assessment and management plan was discussed with the patient. The patient verbalized understanding of and has agreed to the management plan. Patient is aware to call the clinic if symptoms persist or worsen. Patient is aware when to return to the clinic for a follow-up visit. Patient educated on when it is appropriate to go to the emergency department.   Time call ended:  3:05  I provided 10 minutes of non-face-to-face time  during this encounter.    Mary-Margaret Hassell Done, FNP

## 2018-12-06 ENCOUNTER — Telehealth: Payer: Self-pay | Admitting: *Deleted

## 2018-12-06 NOTE — Telephone Encounter (Signed)
Fax from [employer] does not cover Hydrocortisone suppository Could this be changed to cream Please advise

## 2018-12-07 MED ORDER — HYDROCORTISONE (PERIANAL) 2.5 % EX CREA: 1.0000 "application " | TOPICAL_CREAM | Freq: Two times a day (BID) | CUTANEOUS | 0 refills | Status: DC

## 2018-12-07 NOTE — Telephone Encounter (Signed)
anusol HC cream sent to pharmacy

## 2018-12-07 NOTE — Addendum Note (Signed)
Addended by: [name] on: 12/07/2018 10:07 AM   Modules accepted: Orders

## 2018-12-19 ENCOUNTER — Telehealth: Payer: Self-pay | Admitting: Nurse Practitioner

## 2018-12-21 NOTE — Telephone Encounter (Signed)
Called patient left message for her to call back. MPulliam, CMA/RT(R)

## 2018-12-30 ENCOUNTER — Encounter: Payer: Self-pay | Admitting: Family Medicine

## 2018-12-30 ENCOUNTER — Ambulatory Visit (INDEPENDENT_AMBULATORY_CARE_PROVIDER_SITE_OTHER): Payer: Medicare Other | Admitting: Family Medicine

## 2018-12-30 DIAGNOSIS — J019 Acute sinusitis, unspecified: Secondary | ICD-10-CM

## 2018-12-30 MED ORDER — CEFIXIME 400 MG PO CAPS: 400.0000 mg | ORAL_CAPSULE | Freq: Every day | ORAL | 0 refills | Status: AC

## 2018-12-30 NOTE — Patient Instructions (Signed)

## 2018-12-30 NOTE — Progress Notes (Addendum)
Virtual Visit via Telephone Note  I connected with [name] on 12/30/18 at 8:05 AM by telephone and verified that I am speaking with the correct person using two identifiers. [name] is currently located at home and nobody is currently with her during this visit. The provider, [name] Brooklyn, FNP is located in their home at time of visit.  I discussed the limitations, risks, security and privacy concerns of performing an evaluation and management service by telephone and the availability of in person appointments. I also discussed with the patient that there may be a patient responsible charge related to this service. The patient expressed understanding and agreed to proceed.  Subjective: PCP: [name] Pretty, FNP  Chief Complaint  Patient presents with  . Otalgia   Patient complains of ear pain/pressure that is worse on the right than the left. Additional symptoms include headache, facial pain/pressure, postnasal drainage and increased snot. Onset of symptoms was over a week ago, gradually worsening since that time. She is drinking plenty of fluids. Evaluation to date: none. Treatment to date: decongestants. She does not smoke.    ROS: Per HPI  Current Outpatient Medications:  .  ALPRAZolam (XANAX) 0.5 MG tablet, Take 1 tablet (0.5 mg total) by mouth 2 (two) times daily as needed for anxiety., Disp: 60 tablet, Rfl: 3 .  antiseptic oral rinse (BIOTENE) LIQD, 1 application by Mouth Rinse route daily. FOR DRY MOUTH, Disp: , Rfl:  .  benazepril-hydrochlorthiazide (LOTENSIN HCT) 20-12.5 MG tablet, TAKE (1) TABLET BY MOUTH ONCE DAILY., Disp: 90 tablet, Rfl: 1 .  co-enzyme Q-10 50 MG capsule, Take 50 mg by mouth once a week. , Disp: , Rfl:  .  dicyclomine (BENTYL) 20 MG tablet, Take 0.5-1 tablets (10-20 mg total) by mouth daily., Disp: 90 tablet, Rfl: 1 .  fenofibrate (TRICOR) 145 MG tablet, Take 1 tablet (145 mg total) by mouth daily., Disp: 90 tablet, Rfl: 1 .  hydrocortisone  (ANUSOL-HC) 2.5 % rectal cream, Place 1 application rectally 2 (two) times daily., Disp: 30 g, Rfl: 0 .  hydrocortisone (ANUSOL-HC) 25 MG suppository, Place 1 suppository (25 mg total) rectally 2 (two) times daily., Disp: 12 suppository, Rfl: 0 .  Loperamide HCl (IMODIUM PO), Take 1 tablet by mouth daily as needed (diarrhea)., Disp: , Rfl:  .  loratadine-pseudoephedrine (CLARITIN-D 12-HOUR) 5-120 MG tablet, Take 1 tablet by mouth every 12 (twelve) hours., Disp: , Rfl:  .  Multiple Vitamins-Minerals (WOMENS MULTI VITAMIN & MINERAL PO), Take 1 tablet by mouth every other day. , Disp: , Rfl:  .  Omega-3 Fatty Acids (FISH OIL) 1000 MG CPDR, Take 3 capsules by mouth daily. , Disp: , Rfl:  .  ondansetron (ZOFRAN) 4 MG tablet, TAKE 1 TABLET TWICE DAILY AS NEEDED FOR NAUSEA AND VOMITING., Disp: 20 tablet, Rfl: 1 .  OVER THE COUNTER MEDICATION, Take 1 tablet by mouth daily. Vitamin K -2 Patient states that she takes daily with her Vitamin D. , Disp: , Rfl:  .  [START ON 02/19/2019] oxyCODONE (ROXICODONE) 5 MG immediate release tablet, Take 1 tablet (5 mg total) by mouth 2 (two) times daily., Disp: 60 tablet, Rfl: 0 .  [START ON 01/20/2019] oxyCODONE (ROXICODONE) 5 MG immediate release tablet, Take 1 tablet (5 mg total) by mouth 2 (two) times daily., Disp: 60 tablet, Rfl: 0 .  oxyCODONE (ROXICODONE) 5 MG immediate release tablet, Take 1 tablet (5 mg total) by mouth 2 (two) times daily., Disp: 60 tablet, Rfl: 0 .  Probiotic  Product (PROBIOTIC FORMULA PO), Take 1-2 capsules by mouth every morning. , Disp: , Rfl:  .  QUEtiapine (SEROQUEL) 100 MG tablet, Take 1 tablet (100 mg total) by mouth at bedtime., Disp: 90 tablet, Rfl: 1 .  rosuvastatin (CRESTOR) 10 MG tablet, Take 1 tablet (10 mg total) by mouth daily., Disp: 90 tablet, Rfl: 1 .  shark liver oil-cocoa butter (PREPARATION H) 0.25-3-85.5 % suppository, Place 1 suppository rectally as needed for hemorrhoids., Disp: , Rfl:  .  vitamin E 400 UNIT capsule, Take  400 Units by mouth every morning. , Disp: , Rfl:   Allergies  Allergen Reactions  . Azathioprine Nausea And Vomiting and Other (See Comments)    SEVERE NAUSEA AND VOMITING WITH CHEST PAIN-  PATIENT INSISTS NEVER TO BE GIVEN ANYTHING SIMILAR TO THIS  . Tape Other (See Comments) and Rash    BLISTERS AND SKIN TEARING  . Codeine Nausea And Vomiting  . Lac Bovis Nausea And Vomiting  . Penicillins Hives    Has patient had a PCN reaction causing immediate rash, facial/tongue/throat swelling, SOB or lightheadedness with hypotension: No Has patient had a PCN reaction causing severe rash involving mucus membranes or skin necrosis: No Has patient had a PCN reaction that required hospitalization: No Has patient had a PCN reaction occurring within the last 10 years: No If all of the above answers are "NO", then may proceed with Cephalosporin use.   . Povidone-Iodine Hives    BETADINE  . Procaine Hcl Other (See Comments)    SEVERE GI UPSET (NAUSEA,VOMITING AND DIARRHEA)  . Sulfonamide Derivatives Hives  . Tramadol Itching  . Acyclovir And Related   . Azithromycin     Mycins   . Clonazepam Other (See Comments)    Skin burning, insomnia, diarrhea.  . Dairy Aid [Lactase]   . Doxycycline Rash  . Latex Rash  . Niacin And Related Rash   Past Medical History:  Diagnosis Date  . Anemia due to blood loss 05/02/2011  . Anxiety   . Arthritis    rheumatoid   . Collagen vascular disease ([place])    ra  . Fibromyalgia   . Hyperglycemia, drug-induced 05/02/2011  . Hypertension   . Ulcerative colitis     Observations/Objective: A&O  No respiratory distress or wheezing audible over the phone Mood, judgement, and thought processes all WNL  Assessment and Plan: 1. Acute non-recurrent sinusitis, unspecified location - Education provided on sinusitis. Symptom management. Encouraged to go to urgent care if symptoms do not start improving with antibiotics. Upon chart review it appears patient does tolerate  cephalosporins despite an allergy to penicillins.  - cefixime (SUPRAX) 400 MG CAPS capsule; Take 1 capsule (400 mg total) by mouth daily for 7 days.  Dispense: 7 capsule; Refill: 0  Addendum: cefixime not available at patient's pharmacy; changed to cefpodoxime 200 mg PO BID x7 days.   Follow Up Instructions:  I discussed the assessment and treatment plan with the patient. The patient was provided an opportunity to ask questions and all were answered. The patient agreed with the plan and demonstrated an understanding of the instructions.   The patient was advised to call back or seek an in-person evaluation if the symptoms worsen or if the condition fails to improve as anticipated.  The above assessment and management plan was discussed with the patient. The patient verbalized understanding of and has agreed to the management plan. Patient is aware to call the clinic if symptoms persist or worsen. Patient is aware  when to return to the clinic for a follow-up visit. Patient educated on when it is appropriate to go to the emergency department.   Time call ended: 8:15 AM  I provided 12 minutes of non-face-to-face time during this encounter.  Hendricks Limes, MSN, APRN, FNP-C [facility] 12/30/18

## 2019-02-20 ENCOUNTER — Other Ambulatory Visit: Payer: Self-pay

## 2019-02-21 ENCOUNTER — Ambulatory Visit (INDEPENDENT_AMBULATORY_CARE_PROVIDER_SITE_OTHER): Payer: Medicare Other | Admitting: Nurse Practitioner

## 2019-02-21 ENCOUNTER — Encounter: Payer: Self-pay | Admitting: Nurse Practitioner

## 2019-02-21 DIAGNOSIS — K5150 Left sided colitis without complications: Secondary | ICD-10-CM

## 2019-02-21 DIAGNOSIS — M797 Fibromyalgia: Secondary | ICD-10-CM

## 2019-02-21 DIAGNOSIS — E785 Hyperlipidemia, unspecified: Secondary | ICD-10-CM | POA: Diagnosis not present

## 2019-02-21 DIAGNOSIS — E8881 Metabolic syndrome: Secondary | ICD-10-CM | POA: Diagnosis not present

## 2019-02-21 DIAGNOSIS — F5101 Primary insomnia: Secondary | ICD-10-CM

## 2019-02-21 DIAGNOSIS — I10 Essential (primary) hypertension: Secondary | ICD-10-CM | POA: Diagnosis not present

## 2019-02-21 DIAGNOSIS — N1832 Chronic kidney disease, stage 3b: Secondary | ICD-10-CM

## 2019-02-21 DIAGNOSIS — F3342 Major depressive disorder, recurrent, in full remission: Secondary | ICD-10-CM

## 2019-02-21 MED ORDER — DICYCLOMINE HCL 20 MG PO TABS: 10.0000 mg | ORAL_TABLET | Freq: Every day | ORAL | 1 refills | Status: DC

## 2019-02-21 MED ORDER — ROSUVASTATIN CALCIUM 10 MG PO TABS: 10.0000 mg | ORAL_TABLET | Freq: Every day | ORAL | 1 refills | Status: DC

## 2019-02-21 MED ORDER — BENAZEPRIL-HYDROCHLOROTHIAZIDE 20-12.5 MG PO TABS: ORAL_TABLET | ORAL | 1 refills | Status: DC

## 2019-02-21 MED ORDER — OXYCODONE HCL 5 MG PO TABS: 5.0000 mg | ORAL_TABLET | Freq: Two times a day (BID) | ORAL | 0 refills | Status: DC

## 2019-02-21 MED ORDER — FENOFIBRATE 145 MG PO TABS: 145.0000 mg | ORAL_TABLET | Freq: Every day | ORAL | 1 refills | Status: DC

## 2019-02-21 MED ORDER — ALPRAZOLAM 0.5 MG PO TABS: 0.5000 mg | ORAL_TABLET | Freq: Two times a day (BID) | ORAL | 3 refills | Status: DC | PRN

## 2019-02-21 MED ORDER — QUETIAPINE FUMARATE 100 MG PO TABS: 100.0000 mg | ORAL_TABLET | Freq: Every day | ORAL | 1 refills | Status: DC

## 2019-02-21 NOTE — Progress Notes (Signed)
Virtual Visit via telephone Note Due to COVID-19 pandemic this visit was conducted virtually. This visit type was conducted due to national recommendations for restrictions regarding the COVID-19 Pandemic (e.g. social distancing, sheltering in place) in an effort to limit this patient's exposure and mitigate transmission in our community. All issues noted in this document were discussed and addressed.  A physical exam was not performed with this format.  I connected with [name] on 02/21/19 at 5:00 by telephone and verified that I am speaking with the correct person using two identifiers. [name] is currently located at home and no one is currently with her during visit. The provider, Mary-Margaret Hassell Done, FNP is located in their office at time of visit.  I discussed the limitations, risks, security and privacy concerns of performing an evaluation and management service by telephone and the availability of in person appointments. I also discussed with the patient that there may be a patient responsible charge related to this service. The patient expressed understanding and agreed to proceed.   History and Present Illness:   Chief Complaint: Medical Management of Chronic Issues    HPI:  1. Essential hypertension No c/o chest pain, sob or headache. Does not check blood pressure at home. BP Readings from Last 3 Encounters:  08/25/18 140/90  08/22/18 140/90  05/18/18 (!) 132/93     2. Stage 3b chronic kidney disease No problems voiding. Lab Results  Component Value Date   CREATININE 1.25 (H) 05/18/2018     3. Metabolic syndrome She does not check blood sugars at home. Lab Results  Component Value Date   HGBA1C 6.3 05/18/2018     4. Hyperlipidemia with target LDL less than 100 She has not been watching diet and does very little exercise. Lab Results  Component Value Date   CHOL 242 (H) 05/18/2018   HDL 34 (L) 05/18/2018   LDLCALC 137 (H) 05/18/2018   TRIG 353  (H) 05/18/2018   CHOLHDL 7.1 (H) 05/18/2018     5. Primary insomnia She is on seroquel which helps some but does not sleep through night without waking up a couple of times. She is ale to go back to sleep  6. Recurrent major depressive disorder, in full remission [facility]) Doing ok right now  7. Fibromyalgia Pain assessment: Cause of pain- fibromyalgia and ulcerative colitis Pain location- moves around Pain on scale of 1-10- 6/10 currently Frequency- daily What increases pain-weather affects her and over doing it What makes pain Better-rest Effects on ADL - none Any change in general medical condition-none  Current opioids rx- oxycodone 56m BID # meds rx- 60 Effectiveness of current meds-helps bring pain down 1-2/10 Adverse reactions form pain meds-none Morphine equivalent- 15MEDD  Pill count performed-No Last drug screen - 08/22/18 ( high risk q360mmoderate risk q6m16mow risk yearly ) Urine drug screen today- No [place] [facility]  If yes were their any concerning findings? - no   Pain contract signed on:11/16/17- woill have new one signed at next face to face visit   8. ULCERATIVE COLITIS, LEFT SIDED bentyl as needed- pan meds help with that also. If she watches her diet she has less flare ups.    Outpatient Encounter Medications as of 02/21/2019  Medication Sig  . ALPRAZolam (XANAX) 0.5 MG tablet Take 1 tablet (0.5 mg total) by mouth 2 (two) times daily as needed for anxiety.  . aMarland Kitchentiseptic oral rinse (BIOTENE) LIQD 1 application by Mouth Rinse route daily. FOR DRY  MOUTH  . benazepril-hydrochlorthiazide (LOTENSIN HCT) 20-12.5 MG tablet TAKE (1) TABLET BY MOUTH ONCE DAILY.  Marland Kitchen co-enzyme Q-10 50 MG capsule Take 50 mg by mouth once a week.   . dicyclomine (BENTYL) 20 MG tablet Take 0.5-1 tablets (10-20 mg total) by mouth daily.  . fenofibrate (TRICOR) 145 MG tablet Take 1 tablet (145 mg total) by mouth daily.  . hydrocortisone (ANUSOL-HC) 2.5 % rectal cream Place 1  application rectally 2 (two) times daily.  . hydrocortisone (ANUSOL-HC) 25 MG suppository Place 1 suppository (25 mg total) rectally 2 (two) times daily.  . Loperamide HCl (IMODIUM PO) Take 1 tablet by mouth daily as needed (diarrhea).  . loratadine-pseudoephedrine (CLARITIN-D 12-HOUR) 5-120 MG tablet Take 1 tablet by mouth every 12 (twelve) hours.  . Multiple Vitamins-Minerals (WOMENS MULTI VITAMIN & MINERAL PO) Take 1 tablet by mouth every other day.   . Omega-3 Fatty Acids (FISH OIL) 1000 MG CPDR Take 3 capsules by mouth daily.   . ondansetron (ZOFRAN) 4 MG tablet TAKE 1 TABLET TWICE DAILY AS NEEDED FOR NAUSEA AND VOMITING.  . OVER THE COUNTER MEDICATION Take 1 tablet by mouth daily. Vitamin K -2 Patient states that she takes daily with her Vitamin D.   . oxyCODONE (ROXICODONE) 5 MG immediate release tablet Take 1 tablet (5 mg total) by mouth 2 (two) times daily.  Marland Kitchen oxyCODONE (ROXICODONE) 5 MG immediate release tablet Take 1 tablet (5 mg total) by mouth 2 (two) times daily.  Marland Kitchen oxyCODONE (ROXICODONE) 5 MG immediate release tablet Take 1 tablet (5 mg total) by mouth 2 (two) times daily.  . Probiotic Product (PROBIOTIC FORMULA PO) Take 1-2 capsules by mouth every morning.   Marland Kitchen QUEtiapine (SEROQUEL) 100 MG tablet Take 1 tablet (100 mg total) by mouth at bedtime.  . rosuvastatin (CRESTOR) 10 MG tablet Take 1 tablet (10 mg total) by mouth daily.  . shark liver oil-cocoa butter (PREPARATION H) 0.25-3-85.5 % suppository Place 1 suppository rectally as needed for hemorrhoids.  . vitamin E 400 UNIT capsule Take 400 Units by mouth every morning.   . [DISCONTINUED] sodium chloride (OCEAN) 0.65 % SOLN nasal spray Place 2 sprays into the nose as needed for congestion.   No facility-administered encounter medications on file as of 02/21/2019.     Past Surgical History:  Procedure Laterality Date  . ABDOMINAL HYSTERECTOMY    . BIOPSY N/A 12/28/2014   Procedure: SIGMOID AND RECTAL BIOPSIES;  Surgeon: Rogene Houston, MD;  Location: [facility];  Service: Endoscopy;  Laterality: N/A;  . CHOLECYSTECTOMY     s/p  . COLONOSCOPY  06/16/06. 04/27/2007   proctitis seen, biopsies showed chronic active colitis, no dysplasia   . EYE SURGERY     clear lense extraction 1998   . FLEXIBLE SIGMOIDOSCOPY N/A 12/28/2014   Procedure: FLEXIBLE SIGMOIDOSCOPY WITH PROPOFOL;  Surgeon: Rogene Houston, MD;  Location: [facility];  Service: Endoscopy;  Laterality: N/A;    Family History  Problem Relation Age of Onset  . Heart attack Mother 13       Died in her sleep  . Diabetes Mother   . Heart attack Father 80       Died in her sleep  . Heart attack Brother 42       Defib, CABG  . Diabetes Brother   . Kidney disease Brother        dyalasis   . Cancer Sister        liver, breast   . Heart  disease Sister     New complaints: none  Social history: Lives by herself     Review of Systems  Constitutional: Negative for diaphoresis and weight loss.  Eyes: Negative for blurred vision, double vision and pain.  Respiratory: Negative for shortness of breath.   Cardiovascular: Negative for chest pain, palpitations, orthopnea and leg swelling.  Gastrointestinal: Negative for abdominal pain.  Musculoskeletal: Positive for myalgias.  Skin: Negative for rash.  Neurological: Negative for dizziness, sensory change, loss of consciousness, weakness and headaches.  Endo/Heme/Allergies: Negative for polydipsia. Does not bruise/bleed easily.  Psychiatric/Behavioral: Negative for memory loss. The patient does not have insomnia.   All other systems reviewed and are negative.    Observations/Objective: Alert and oriented- answers all questions appropriately No distress    Assessment and Plan:  [name] comes in today with chief complaint of Medical Management of Chronic Issues   Diagnosis and orders addressed:  1. Essential hypertension Low sodium diet - benazepril-hydrochlorthiazide (LOTENSIN HCT) 20-12.5 MG  tablet; TAKE (1) TABLET BY MOUTH ONCE DAILY.  Dispense: 90 tablet; Refill: 1  2. Stage 3b chronic kidney disease lqbs pending  3. Metabolic syndrome Watch carbs in diet  4. Hyperlipidemia with target LDL less than 100 Low fat diet - fenofibrate (TRICOR) 145 MG tablet; Take 1 tablet (145 mg total) by mouth daily.  Dispense: 90 tablet; Refill: 1  5. Primary insomnia Bedtime routine reviewed - QUEtiapine (SEROQUEL) 100 MG tablet; Take 1 tablet (100 mg total) by mouth at bedtime.  Dispense: 90 tablet; Refill: 1  6. Recurrent major depressive disorder, in full remission ([place]) Really need to try to cut bak on xanax and only take when necessary - ALPRAZolam (XANAX) 0.5 MG tablet; Take 1 tablet (0.5 mg total) by mouth 2 (two) times daily as needed for anxiety.  Dispense: 60 tablet; Refill: 3  7. Fibromyalgia Keep muscles warm - oxyCODONE (ROXICODONE) 5 MG immediate release tablet; Take 1 tablet (5 mg total) by mouth 2 (two) times daily.  Dispense: 60 tablet; Refill: 0 - oxyCODONE (ROXICODONE) 5 MG immediate release tablet; Take 1 tablet (5 mg total) by mouth 2 (two) times daily.  Dispense: 60 tablet; Refill: 0 - oxyCODONE (ROXICODONE) 5 MG immediate release tablet; Take 1 tablet (5 mg total) by mouth 2 (two) times daily.  Dispense: 60 tablet; Refill: 0  8. ULCERATIVE COLITIS, LEFT SIDED Watch diet to prevent flare ups - dicyclomine (BENTYL) 20 MG tablet; Take 0.5-1 tablets (10-20 mg total) by mouth daily.  Dispense: 90 tablet; Refill: 1   Labs pending Health Maintenance reviewed Diet and exercise encouraged  Follow up plan: 3 months     I discussed the assessment and treatment plan with the patient. The patient was provided an opportunity to ask questions and all were answered. The patient agreed with the plan and demonstrated an understanding of the instructions.   The patient was advised to call back or seek an in-person evaluation if the symptoms worsen or if the condition fails  to improve as anticipated.  The above assessment and management plan was discussed with the patient. The patient verbalized understanding of and has agreed to the management plan. Patient is aware to call the clinic if symptoms persist or worsen. Patient is aware when to return to the clinic for a follow-up visit. Patient educated on when it is appropriate to go to the emergency department.   Time call ended:  5:17  I provided 17 minutes of non-face-to-face time during this encounter.  Mary-Margaret Hassell Done, FNP

## 2019-04-06 ENCOUNTER — Ambulatory Visit (INDEPENDENT_AMBULATORY_CARE_PROVIDER_SITE_OTHER): Payer: Medicare Other | Admitting: Family Medicine

## 2019-04-06 ENCOUNTER — Encounter: Payer: Self-pay | Admitting: Family Medicine

## 2019-04-06 DIAGNOSIS — M545 Low back pain, unspecified: Secondary | ICD-10-CM

## 2019-04-06 MED ORDER — PREDNISONE 10 MG (21) PO TBPK: ORAL_TABLET | ORAL | 0 refills | Status: DC

## 2019-04-06 MED ORDER — METHOCARBAMOL 500 MG PO TABS: 500.0000 mg | ORAL_TABLET | Freq: Three times a day (TID) | ORAL | 0 refills | Status: DC | PRN

## 2019-04-06 NOTE — Progress Notes (Signed)
Virtual Visit via Telephone Note  I connected with [name] on 04/06/19 at 1:18 PM by telephone and verified that I am speaking with the correct person using two identifiers. [name] is currently located at home and nobody is currently with her during this visit. The provider, Loman Brooklyn, FNP is located in their office at time of visit.  I discussed the limitations, risks, security and privacy concerns of performing an evaluation and management service by telephone and the availability of in person appointments. I also discussed with the patient that there may be a patient responsible charge related to this service. The patient expressed understanding and agreed to proceed.  Subjective: PCP: [name], FNP  Chief Complaint  Patient presents with  . Back Pain   Back Pain: Patient c/o low back pain that started a few days before Christmas when she was getting down her 50 lb Christmas tree.  She describes pain as aching mostly but does sometimes experiencing a stabbing pain; it occurs across her entire low back. There is no radiation of pain. She rates pain 9/10 without medication and 5/10 with medication.  Alleviating factors identifiable by patient are medication oxycodone, heat, muscle rubs. She has thought about going to the chiropractor but is afraid to go out due to COVID-19.    ROS: Per HPI  Current Outpatient Medications:  .  ALPRAZolam (XANAX) 0.5 MG tablet, Take 1 tablet (0.5 mg total) by mouth 2 (two) times daily as needed for anxiety., Disp: 60 tablet, Rfl: 3 .  antiseptic oral rinse (BIOTENE) LIQD, 1 application by Mouth Rinse route daily. FOR DRY MOUTH, Disp: , Rfl:  .  benazepril-hydrochlorthiazide (LOTENSIN HCT) 20-12.5 MG tablet, TAKE (1) TABLET BY MOUTH ONCE DAILY., Disp: 90 tablet, Rfl: 1 .  co-enzyme Q-10 50 MG capsule, Take 50 mg by mouth once a week. , Disp: , Rfl:  .  dicyclomine (BENTYL) 20 MG tablet, Take 0.5-1 tablets (10-20 mg total) by  mouth daily., Disp: 90 tablet, Rfl: 1 .  fenofibrate (TRICOR) 145 MG tablet, Take 1 tablet (145 mg total) by mouth daily., Disp: 90 tablet, Rfl: 1 .  hydrocortisone (ANUSOL-HC) 2.5 % rectal cream, Place 1 application rectally 2 (two) times daily., Disp: 30 g, Rfl: 0 .  hydrocortisone (ANUSOL-HC) 25 MG suppository, Place 1 suppository (25 mg total) rectally 2 (two) times daily., Disp: 12 suppository, Rfl: 0 .  Loperamide HCl (IMODIUM PO), Take 1 tablet by mouth daily as needed (diarrhea)., Disp: , Rfl:  .  loratadine-pseudoephedrine (CLARITIN-D 12-HOUR) 5-120 MG tablet, Take 1 tablet by mouth every 12 (twelve) hours., Disp: , Rfl:  .  Multiple Vitamins-Minerals (WOMENS MULTI VITAMIN & MINERAL PO), Take 1 tablet by mouth every other day. , Disp: , Rfl:  .  Omega-3 Fatty Acids (FISH OIL) 1000 MG CPDR, Take 3 capsules by mouth daily. , Disp: , Rfl:  .  ondansetron (ZOFRAN) 4 MG tablet, TAKE 1 TABLET TWICE DAILY AS NEEDED FOR NAUSEA AND VOMITING., Disp: 20 tablet, Rfl: 1 .  OVER THE COUNTER MEDICATION, Take 1 tablet by mouth daily. Vitamin K -2 Patient states that she takes daily with her Vitamin D. , Disp: , Rfl:  .  [START ON 05/20/2019] oxyCODONE (ROXICODONE) 5 MG immediate release tablet, Take 1 tablet (5 mg total) by mouth 2 (two) times daily., Disp: 60 tablet, Rfl: 0 .  [START ON 04/20/2019] oxyCODONE (ROXICODONE) 5 MG immediate release tablet, Take 1 tablet (5 mg total) by mouth 2 (two) times  daily., Disp: 60 tablet, Rfl: 0 .  oxyCODONE (ROXICODONE) 5 MG immediate release tablet, Take 1 tablet (5 mg total) by mouth 2 (two) times daily., Disp: 60 tablet, Rfl: 0 .  Probiotic Product (PROBIOTIC FORMULA PO), Take 1-2 capsules by mouth every morning. , Disp: , Rfl:  .  QUEtiapine (SEROQUEL) 100 MG tablet, Take 1 tablet (100 mg total) by mouth at bedtime., Disp: 90 tablet, Rfl: 1 .  rosuvastatin (CRESTOR) 10 MG tablet, Take 1 tablet (10 mg total) by mouth daily., Disp: 90 tablet, Rfl: 1 .  shark liver  oil-cocoa butter (PREPARATION H) 0.25-3-85.5 % suppository, Place 1 suppository rectally as needed for hemorrhoids., Disp: , Rfl:  .  vitamin E 400 UNIT capsule, Take 400 Units by mouth every morning. , Disp: , Rfl:   Allergies  Allergen Reactions  . Azathioprine Nausea And Vomiting and Other (See Comments)    SEVERE NAUSEA AND VOMITING WITH CHEST PAIN-  PATIENT INSISTS NEVER TO BE GIVEN ANYTHING SIMILAR TO THIS  . Tape Other (See Comments) and Rash    BLISTERS AND SKIN TEARING  . Codeine Nausea And Vomiting  . Lac Bovis Nausea And Vomiting  . Penicillins Hives    Has patient had a PCN reaction causing immediate rash, facial/tongue/throat swelling, SOB or lightheadedness with hypotension: No Has patient had a PCN reaction causing severe rash involving mucus membranes or skin necrosis: No Has patient had a PCN reaction that required hospitalization: No Has patient had a PCN reaction occurring within the last 10 years: No If all of the above answers are "NO", then may proceed with Cephalosporin use.   . Povidone-Iodine Hives    BETADINE  . Procaine Hcl Other (See Comments)    SEVERE GI UPSET (NAUSEA,VOMITING AND DIARRHEA)  . Sulfonamide Derivatives Hives  . Tramadol Itching  . Acyclovir And Related   . Azithromycin     Mycins   . Clonazepam Other (See Comments)    Skin burning, insomnia, diarrhea.  . Dairy Aid [Lactase]   . Doxycycline Rash  . Latex Rash  . Niacin And Related Rash   Past Medical History:  Diagnosis Date  . Anemia due to blood loss 05/02/2011  . Anxiety   . Arthritis    rheumatoid   . Collagen vascular disease ([place])    ra  . Fibromyalgia   . Hyperglycemia, drug-induced 05/02/2011  . Hypertension   . Ulcerative colitis     Observations/Objective: A&O  No respiratory distress or wheezing audible over the phone Mood, judgement, and thought processes all WNL  Assessment and Plan: 1. Acute bilateral low back pain without sciatica - Encouraged gentle  exercises for her low back. She is going to try the following and if she does not get relief may go to the chiropractor.  - predniSONE (STERAPRED UNI-PAK 21 TAB) 10 MG (21) TBPK tablet; As directed x 6 days  Dispense: 21 tablet; Refill: 0 - methocarbamol (ROBAXIN) 500 MG tablet; Take 1 tablet (500 mg total) by mouth every 8 (eight) hours as needed for muscle spasms.  Dispense: 30 tablet; Refill: 0   Follow Up Instructions:  I discussed the assessment and treatment plan with the patient. The patient was provided an opportunity to ask questions and all were answered. The patient agreed with the plan and demonstrated an understanding of the instructions.   The patient was advised to call back or seek an in-person evaluation if the symptoms worsen or if the condition fails to improve as anticipated.  The above assessment and management plan was discussed with the patient. The patient verbalized understanding of and has agreed to the management plan. Patient is aware to call the clinic if symptoms persist or worsen. Patient is aware when to return to the clinic for a follow-up visit. Patient educated on when it is appropriate to go to the emergency department.   Time call ended: 1:30 PM  I provided 14 minutes of non-face-to-face time during this encounter.  Hendricks Limes, MSN, APRN, FNP-C [facility] 04/06/19

## 2019-04-17 ENCOUNTER — Other Ambulatory Visit: Payer: Self-pay | Admitting: Family Medicine

## 2019-04-17 DIAGNOSIS — M545 Low back pain, unspecified: Secondary | ICD-10-CM

## 2019-05-15 ENCOUNTER — Other Ambulatory Visit: Payer: Self-pay | Admitting: Nurse Practitioner

## 2019-05-15 ENCOUNTER — Other Ambulatory Visit: Payer: Self-pay | Admitting: Family Medicine

## 2019-05-15 DIAGNOSIS — R110 Nausea: Secondary | ICD-10-CM

## 2019-05-15 DIAGNOSIS — M545 Low back pain, unspecified: Secondary | ICD-10-CM

## 2019-05-22 ENCOUNTER — Ambulatory Visit (INDEPENDENT_AMBULATORY_CARE_PROVIDER_SITE_OTHER): Payer: Medicare Other | Admitting: Nurse Practitioner

## 2019-05-22 ENCOUNTER — Encounter: Payer: Self-pay | Admitting: Nurse Practitioner

## 2019-05-22 DIAGNOSIS — K5792 Diverticulitis of intestine, part unspecified, without perforation or abscess without bleeding: Secondary | ICD-10-CM | POA: Diagnosis not present

## 2019-05-22 MED ORDER — METRONIDAZOLE 500 MG PO TABS: 500.0000 mg | ORAL_TABLET | Freq: Two times a day (BID) | ORAL | 0 refills | Status: DC

## 2019-05-22 MED ORDER — CIPROFLOXACIN HCL 500 MG PO TABS: 500.0000 mg | ORAL_TABLET | Freq: Two times a day (BID) | ORAL | 0 refills | Status: DC

## 2019-05-22 NOTE — Progress Notes (Signed)
Virtual Visit via telephone Note Due to COVID-19 pandemic this visit was conducted virtually. This visit type was conducted due to national recommendations for restrictions regarding the COVID-19 Pandemic (e.g. social distancing, sheltering in place) in an effort to limit this patient's exposure and mitigate transmission in our community. All issues noted in this document were discussed and addressed.  A physical exam was not performed with this format.  I connected with [name] on 05/22/19 at 3:10 by telephone and verified that I am speaking with the correct person using two identifiers. [name] is currently located at home and no one is currently with her during visit. The provider, Mary-Margaret Hassell Done, FNP is located in their office at time of visit.  I discussed the limitations, risks, security and privacy concerns of performing an evaluation and management service by telephone and the availability of in person appointments. I also discussed with the patient that there may be a patient responsible charge related to this service. The patient expressed understanding and agreed to proceed.   History and Present Illness:   Chief Complaint: Urinary Tract Infection   HPI Patient calls in c/o weakness , dizziness and slight lower back pain. No appetite. Urine is dark and she is having chills. Started a month ago. Had diarrhea and feels weak. Having some left lower quadrant pain.     Review of Systems  Constitutional: Negative for diaphoresis and weight loss.  Eyes: Negative for blurred vision, double vision and pain.  Respiratory: Negative for shortness of breath.   Cardiovascular: Negative for chest pain, palpitations, orthopnea and leg swelling.  Gastrointestinal: Negative for abdominal pain.  Skin: Negative for rash.  Neurological: Negative for dizziness, sensory change, loss of consciousness, weakness and headaches.  Endo/Heme/Allergies: Negative for polydipsia. Does not  bruise/bleed easily.  Psychiatric/Behavioral: Negative for memory loss. The patient does not have insomnia.   All other systems reviewed and are negative.    Observations/Objective: Alert and oriented- answers all questions appropriately No distress     Assessment and Plan: [name] in today with chief complaint of Urinary Tract Infection   1. Diverticulitis Meds ordered this encounter  Medications  . metroNIDAZOLE (FLAGYL) 500 MG tablet    Sig: Take 1 tablet (500 mg total) by mouth 2 (two) times daily.    Dispense:  14 tablet    Refill:  0    Order Specific Question:   Supervising Provider    Answer:   Caryl Pina A [id number]  . ciprofloxacin (CIPRO) 500 MG tablet    Sig: Take 1 tablet (500 mg total) by mouth 2 (two) times daily.    Dispense:  14 tablet    Refill:  0    Order Specific Question:   Supervising Provider    Answer:   Caryl Pina A [id number]   Watch diet Force fluids     Follow Up Instructions: Has appointment in 2 weeks    I discussed the assessment and treatment plan with the patient. The patient was provided an opportunity to ask questions and all were answered. The patient agreed with the plan and demonstrated an understanding of the instructions.   The patient was advised to call back or seek an in-person evaluation if the symptoms worsen or if the condition fails to improve as anticipated.  The above assessment and management plan was discussed with the patient. The patient verbalized understanding of and has agreed to the management plan. Patient is aware to call the clinic if symptoms persist  or worsen. Patient is aware when to return to the clinic for a follow-up visit. Patient educated on when it is appropriate to go to the emergency department.   Time call ended:  3:23  I provided 13 minutes of non-face-to-face time during this encounter.    Mary-Margaret Hassell Done, FNP

## 2019-05-24 ENCOUNTER — Other Ambulatory Visit: Payer: Self-pay | Admitting: Family Medicine

## 2019-05-24 ENCOUNTER — Telehealth: Payer: Self-pay | Admitting: Nurse Practitioner

## 2019-05-24 MED ORDER — MOXIFLOXACIN HCL 400 MG PO TABS: 400.0000 mg | ORAL_TABLET | Freq: Every day | ORAL | 0 refills | Status: DC

## 2019-05-24 NOTE — Telephone Encounter (Signed)
Please contact the patient : I sent in moxifloxacin in for her. It should cover both of the previous antibiotics anc be much better tolerated. WS

## 2019-05-24 NOTE — Telephone Encounter (Signed)
Patient aware and verbalized understanding. °

## 2019-05-24 NOTE — Telephone Encounter (Signed)
Patient states she can not take ciprofloxacin (CIPRO) 500 MG tablet and flagyl. ITs causing headache, NA, Dizzy she states she can not function. She uses [facility] needs it changed and sent to them. Covering PCP. Please advise

## 2019-06-13 ENCOUNTER — Encounter: Payer: Self-pay | Admitting: Nurse Practitioner

## 2019-06-13 ENCOUNTER — Ambulatory Visit (INDEPENDENT_AMBULATORY_CARE_PROVIDER_SITE_OTHER): Payer: Medicare Other | Admitting: Nurse Practitioner

## 2019-06-13 DIAGNOSIS — K5150 Left sided colitis without complications: Secondary | ICD-10-CM

## 2019-06-13 DIAGNOSIS — M797 Fibromyalgia: Secondary | ICD-10-CM

## 2019-06-13 DIAGNOSIS — F3342 Major depressive disorder, recurrent, in full remission: Secondary | ICD-10-CM

## 2019-06-13 DIAGNOSIS — N1832 Chronic kidney disease, stage 3b: Secondary | ICD-10-CM

## 2019-06-13 DIAGNOSIS — I10 Essential (primary) hypertension: Secondary | ICD-10-CM

## 2019-06-13 DIAGNOSIS — E785 Hyperlipidemia, unspecified: Secondary | ICD-10-CM

## 2019-06-13 DIAGNOSIS — E8881 Metabolic syndrome: Secondary | ICD-10-CM

## 2019-06-13 DIAGNOSIS — F5101 Primary insomnia: Secondary | ICD-10-CM

## 2019-06-13 MED ORDER — BENAZEPRIL-HYDROCHLOROTHIAZIDE 20-12.5 MG PO TABS: ORAL_TABLET | ORAL | 1 refills | Status: DC

## 2019-06-13 MED ORDER — ESCITALOPRAM OXALATE 20 MG PO TABS: 20.0000 mg | ORAL_TABLET | Freq: Every day | ORAL | 5 refills | Status: DC

## 2019-06-13 MED ORDER — OXYCODONE HCL 5 MG PO TABS: 5.0000 mg | ORAL_TABLET | Freq: Two times a day (BID) | ORAL | 0 refills | Status: DC

## 2019-06-13 MED ORDER — ALPRAZOLAM 0.5 MG PO TABS: 0.5000 mg | ORAL_TABLET | Freq: Two times a day (BID) | ORAL | 0 refills | Status: DC | PRN

## 2019-06-13 MED ORDER — QUETIAPINE FUMARATE 100 MG PO TABS: 100.0000 mg | ORAL_TABLET | Freq: Every day | ORAL | 1 refills | Status: DC

## 2019-06-13 NOTE — Progress Notes (Signed)
Virtual Visit via telephone Note Due to COVID-19 pandemic this visit was conducted virtually. This visit type was conducted due to national recommendations for restrictions regarding the COVID-19 Pandemic (e.g. social distancing, sheltering in place) in an effort to limit this patient's exposure and mitigate transmission in our community. All issues noted in this document were discussed and addressed.  A physical exam was not performed with this format.  I connected with [name] on 06/13/19 at 12:10 by telephone and verified that I am speaking with the correct person using two identifiers. [name] is currently located at home and no one is currently with her during visit. The provider, Mary-Margaret Hassell Done, FNP is located in their office at time of visit.  I discussed the limitations, risks, security and privacy concerns of performing an evaluation and management service by telephone and the availability of in person appointments. I also discussed with the patient that there may be a patient responsible charge related to this service. The patient expressed understanding and agreed to proceed.   History and Present Illness:   Chief Complaint: Medical Management of Chronic Issues    HPI:  1. Essential hypertension No c/o chest pain, ob or headache. Does not check blood pressure at home. BP Readings from Last 3 Encounters:  08/25/18 140/90  08/22/18 140/90  05/18/18 (!) 132/93    2. Hyperlipidemia with target LDL less than 100 Has been trying to watch diet but does very little exercise. Refuses statin and only tricor sometimes. Lab Results  Component Value Date   CHOL 242 (H) 05/18/2018   HDL 34 (L) 05/18/2018   LDLCALC 137 (H) 05/18/2018   TRIG 353 (H) 05/18/2018   CHOLHDL 7.1 (H) 05/18/2018      3. Recurrent major depressive disorder, in full remission [facility]) Patient is on xanax BID- she has not been on an antidepressant. We have dicussed xanax and oxycodone. She is  choosing to stop xanax and start or antidepressant.  4. ULCERATIVE COLITIS, LEFT SIDED Has frequent flare ups- has been doing well the last several months.  5. Fibromyalgia She is on oxycodone  BID . Says she needs this more than xanax. Pain assessment: Cause of pain- fibromyalgia and colon Pain location- abdomne with ulcerative colitis and muscle Pain on scale of 1-10- 5/10 currently Frequency- daily What increases pain-nothing makes worse What makes pain Better-sometimes needs antibiotic for colon/ moist heat helps with fibromyalgia Effects on ADL - none Any change in general medical condition-none  Current opioids rx- roxicodone 49m BID # meds rx- 60 Effectiveness of current meds-helps Adverse reactions form pain meds-none Morphine equivalent- 15 MEDD  Pill count performed-No Last drug screen - will do at next visit ( high risk q365mmoderate risk q6m44mow risk yearly ) Urine drug screen today- No Was the NCCIcardviewed- yes  If yes were their any concerning findings? - no   Overdose risk: 1   Pain contract signed on: 11/16/17- will update at next face to face visit   6. Primary insomnia Is on seroquel nightly for sleep. Sleeps well.  7. Metabolic syndrome Does not check blood sugars at home. Lab Results  Component Value Date   HGBA1C 6.3 05/18/2018     8. Stage 3b chronic kidney disease No problems voiding Lab Results  Component Value Date   CREATININE 1.25 (H) 05/18/2018       Outpatient Encounter Medications as of 06/13/2019  Medication Sig  . ALPRAZolam (XANAX) 0.5 MG tablet Take 1 tablet (0.5  mg total) by mouth 2 (two) times daily as needed for anxiety.  Marland Kitchen antiseptic oral rinse (BIOTENE) LIQD 1 application by Mouth Rinse route daily. FOR DRY MOUTH  . benazepril-hydrochlorthiazide (LOTENSIN HCT) 20-12.5 MG tablet TAKE (1) TABLET BY MOUTH ONCE DAILY.  . ciprofloxacin (CIPRO) 500 MG tablet Take 1 tablet (500 mg total) by mouth 2 (two) times daily.  Marland Kitchen  co-enzyme Q-10 50 MG capsule Take 50 mg by mouth once a week.   . dicyclomine (BENTYL) 20 MG tablet Take 0.5-1 tablets (10-20 mg total) by mouth daily.  . fenofibrate (TRICOR) 145 MG tablet Take 1 tablet (145 mg total) by mouth daily.  . hydrocortisone (ANUSOL-HC) 2.5 % rectal cream Place 1 application rectally 2 (two) times daily.  . hydrocortisone (ANUSOL-HC) 25 MG suppository Place 1 suppository (25 mg total) rectally 2 (two) times daily.  . Loperamide HCl (IMODIUM PO) Take 1 tablet by mouth daily as needed (diarrhea).  . loratadine-pseudoephedrine (CLARITIN-D 12-HOUR) 5-120 MG tablet Take 1 tablet by mouth every 12 (twelve) hours.  . methocarbamol (ROBAXIN) 500 MG tablet TAKE 1 TABLET EVERY 8 HOURS AS NEEDED FOR MUSCLE SPAAMS  . metroNIDAZOLE (FLAGYL) 500 MG tablet Take 1 tablet (500 mg total) by mouth 2 (two) times daily.  Marland Kitchen moxifloxacin (AVELOX) 400 MG tablet Take 1 tablet (400 mg total) by mouth daily. Take all of these, for infection  . Multiple Vitamins-Minerals (WOMENS MULTI VITAMIN & MINERAL PO) Take 1 tablet by mouth every other day.   . Omega-3 Fatty Acids (FISH OIL) 1000 MG CPDR Take 3 capsules by mouth daily.   . ondansetron (ZOFRAN) 4 MG tablet TAKE 1 TABLET TWICE DAILY AS NEEDED FOR NAUSEA AND VOMITING.  . OVER THE COUNTER MEDICATION Take 1 tablet by mouth daily. Vitamin K -2 Patient states that she takes daily with her Vitamin D.   . oxyCODONE (ROXICODONE) 5 MG immediate release tablet Take 1 tablet (5 mg total) by mouth 2 (two) times daily.  Marland Kitchen oxyCODONE (ROXICODONE) 5 MG immediate release tablet Take 1 tablet (5 mg total) by mouth 2 (two) times daily.  Marland Kitchen oxyCODONE (ROXICODONE) 5 MG immediate release tablet Take 1 tablet (5 mg total) by mouth 2 (two) times daily.  . predniSONE (STERAPRED UNI-PAK 21 TAB) 10 MG (21) TBPK tablet As directed x 6 days  . Probiotic Product (PROBIOTIC FORMULA PO) Take 1-2 capsules by mouth every morning.   Marland Kitchen QUEtiapine (SEROQUEL) 100 MG tablet Take 1  tablet (100 mg total) by mouth at bedtime.  . rosuvastatin (CRESTOR) 10 MG tablet Take 1 tablet (10 mg total) by mouth daily.  . shark liver oil-cocoa butter (PREPARATION H) 0.25-3-85.5 % suppository Place 1 suppository rectally as needed for hemorrhoids.  . vitamin E 400 UNIT capsule Take 400 Units by mouth every morning.      Past Surgical History:  Procedure Laterality Date  . ABDOMINAL HYSTERECTOMY    . BIOPSY N/A 12/28/2014   Procedure: SIGMOID AND RECTAL BIOPSIES;  Surgeon: Rogene Houston, MD;  Location: [facility];  Service: Endoscopy;  Laterality: N/A;  . CHOLECYSTECTOMY     s/p  . COLONOSCOPY  06/16/06. 04/27/2007   proctitis seen, biopsies showed chronic active colitis, no dysplasia   . EYE SURGERY     clear lense extraction 1998   . FLEXIBLE SIGMOIDOSCOPY N/A 12/28/2014   Procedure: FLEXIBLE SIGMOIDOSCOPY WITH PROPOFOL;  Surgeon: Rogene Houston, MD;  Location: [facility];  Service: Endoscopy;  Laterality: N/A;    Family History  Problem  Relation Age of Onset  . Heart attack Mother 63       Died in her sleep  . Diabetes Mother   . Heart attack Father 80       Died in her sleep  . Heart attack Brother 72       Defib, CABG  . Diabetes Brother   . Kidney disease Brother        dyalasis   . Cancer Sister        liver, breast   . Heart disease Sister     New complaints: None today  Social history: Lives by herself      Review of Systems  Constitutional: Negative for diaphoresis and weight loss.  Eyes: Negative for blurred vision, double vision and pain.  Respiratory: Negative for shortness of breath.   Cardiovascular: Negative for chest pain, palpitations, orthopnea and leg swelling.  Gastrointestinal: Positive for abdominal pain (intermittent).  Musculoskeletal: Positive for myalgias.  Skin: Negative for rash.  Neurological: Negative for dizziness, sensory change, loss of consciousness, weakness and headaches.  Endo/Heme/Allergies: Negative for polydipsia. Does  not bruise/bleed easily.  Psychiatric/Behavioral: Negative for memory loss. The patient has insomnia.   All other systems reviewed and are negative.    Observations/Objective: Alert and oriented- answers all questions appropriately No distress    Assessment and Plan: [name] comes in today with chief complaint of Medical Management of Chronic Issues   Diagnosis and orders addressed:  1. Essential hypertension Low sodium diet - benazepril-hydrochlorthiazide (LOTENSIN HCT) 20-12.5 MG tablet; TAKE (1) TABLET BY MOUTH ONCE DAILY.  Dispense: 90 tablet; Refill: 1  2. Hyperlipidemia with target LDL less than 100 Low fat diet   3. Recurrent major depressive disorder, in full remission ([place]) Going to wean off xanax- will give 1 rx of 60 tablets ad she will ean herself down- she knows that she will not get anymore after this rx. She will start lexapro today.- side effects discussed. - ALPRAZolam (XANAX) 0.5 MG tablet; Take 1 tablet (0.5 mg total) by mouth 2 (two) times daily as needed for anxiety.  Dispense: 60 tablet; Refill: 0  4. ULCERATIVE COLITIS, LEFT SIDED Watch diet to prevent flare ups  5. Fibromyalgia wil stop xanax - oxyCODONE (ROXICODONE) 5 MG immediate release tablet; Take 1 tablet (5 mg total) by mouth 2 (two) times daily.  Dispense: 60 tablet; Refill: 0 - oxyCODONE (ROXICODONE) 5 MG immediate release tablet; Take 1 tablet (5 mg total) by mouth 2 (two) times daily.  Dispense: 60 tablet; Refill: 0 - oxyCODONE (ROXICODONE) 5 MG immediate release tablet; Take 1 tablet (5 mg total) by mouth 2 (two) times daily.  Dispense: 60 tablet; Refill: 0  6. Primary insomnia Bedtime routine - QUEtiapine (SEROQUEL) 100 MG tablet; Take 1 tablet (100 mg total) by mouth at bedtime.  Dispense: 90 tablet; Refill: 1  7. Metabolic syndrome Watch carbs in diet  8. Stage 3b chronic kidney disease Will do labs at vist in 45 days   Labs pending Health Maintenance reviewed Diet and  exercise encouraged  Follow up plan: 45 days   I discussed the assessment and treatment plan with the patient. The patient was provided an opportunity to ask questions and all were answered. The patient agreed with the plan and demonstrated an understanding of the instructions.   The patient was advised to call back or seek an in-person evaluation if the symptoms worsen or if the condition fails to improve as anticipated.  The above assessment and  management plan was discussed with the patient. The patient verbalized understanding of and has agreed to the management plan. Patient is aware to call the clinic if symptoms persist or worsen. Patient is aware when to return to the clinic for a follow-up visit. Patient educated on when it is appropriate to go to the emergency department.   Time call ended:  12:37 I provided 27 minutes of non-face-to-face time during this encounter.    Mary-Margaret Hassell Done, FNP

## 2019-06-14 IMAGING — CT CT ABD-PELV W/O CM
2 of 4 series · 16 of 46 positions shown, 18 images · non-contrast
Comparison: 09/04/2017 radiograph, CT 03/20/2016

CLINICAL DATA: Abdominal pain rectal pain

EXAM:
CT ABDOMEN AND PELVIS WITHOUT CONTRAST
TECHNIQUE: Multidetector CT imaging of the abdomen and pelvis was performed
following the standard protocol without IV contrast.

[Series 2: axial st · axial · 0.73mm/px · z∈[-416,+14]mm · 13 of 94 slices shown, 15 images]
[im 4/94  soft-tissue]
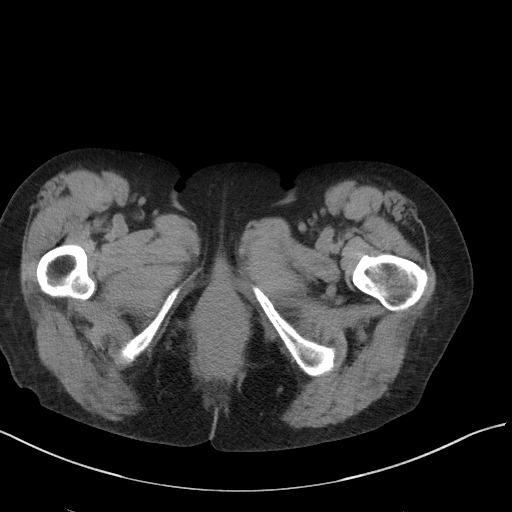
[im 4/94  bone]
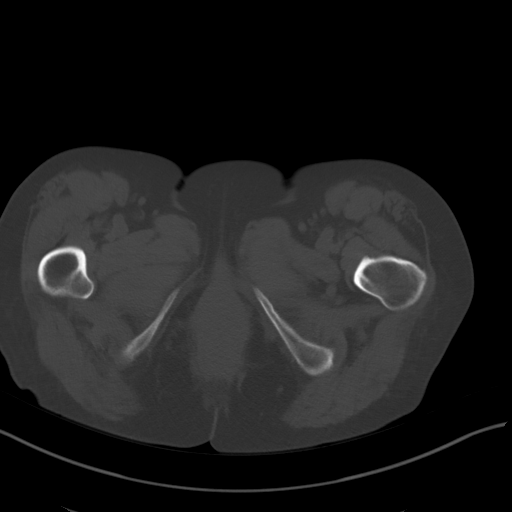
[im 12/94  soft-tissue]
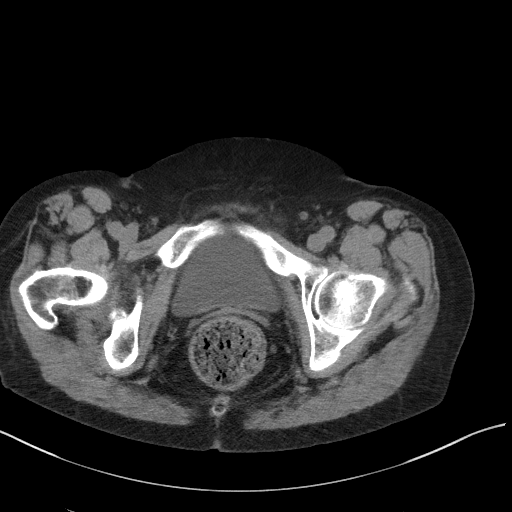
[im 20/94  soft-tissue]
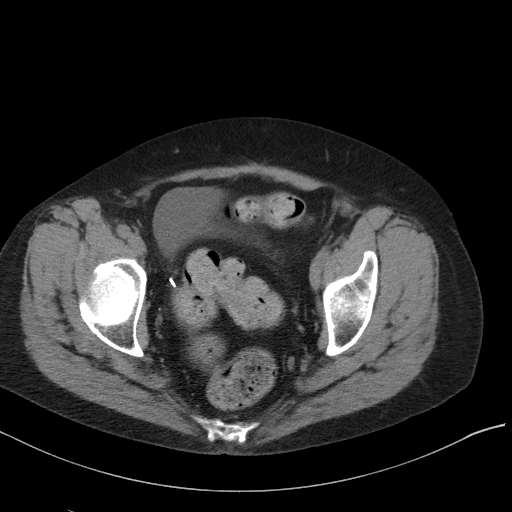
[im 28/94  soft-tissue]
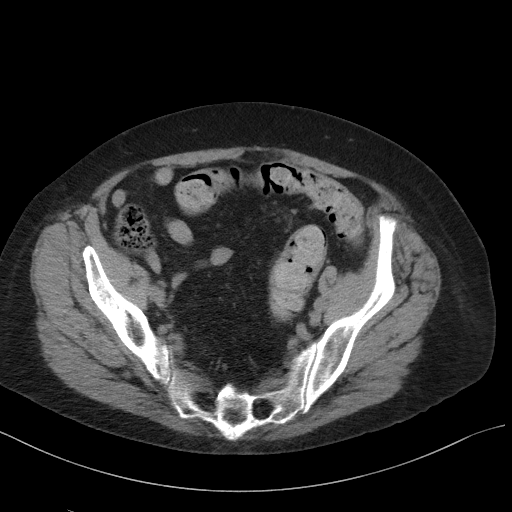
[im 32/94  soft-tissue]
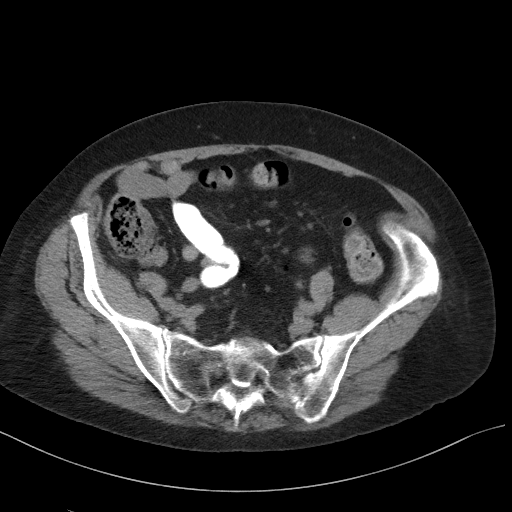
[im 39/94  soft-tissue]
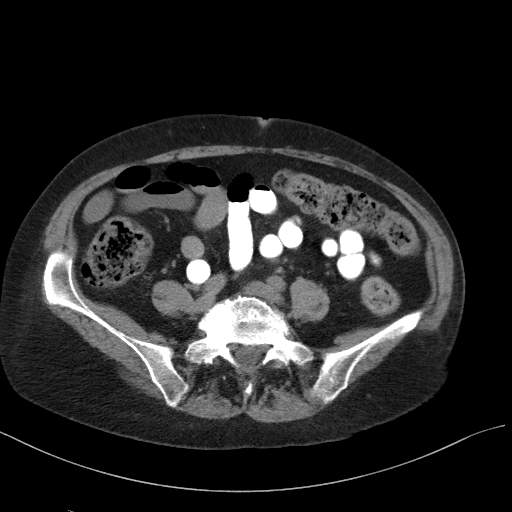
[im 47/94  soft-tissue]
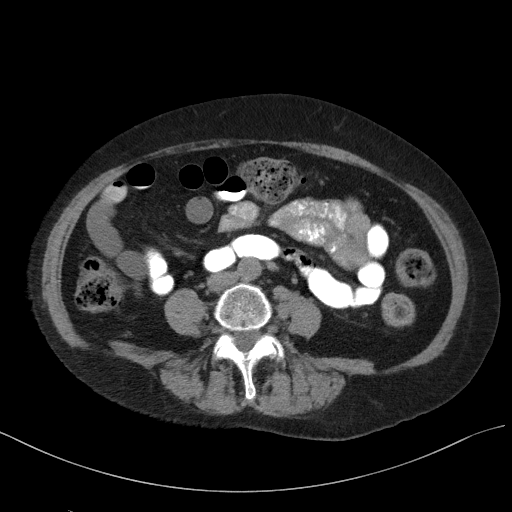
[im 55/94  soft-tissue]
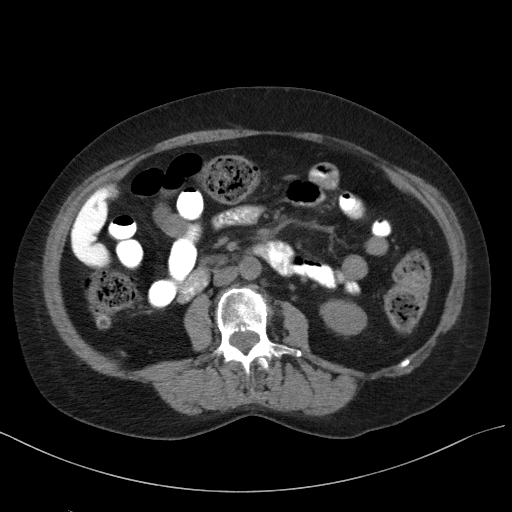
[im 63/94  soft-tissue]
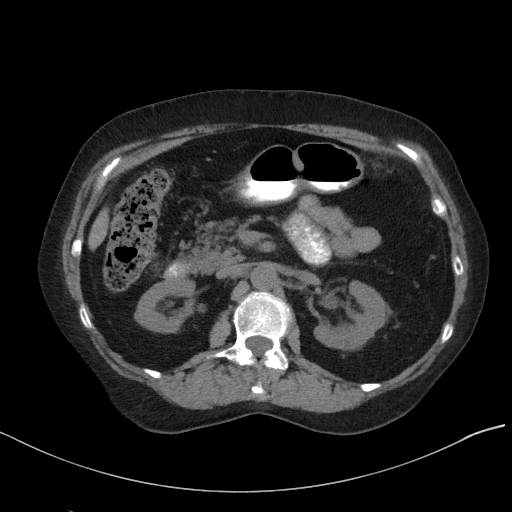
[im 63/94  bone]
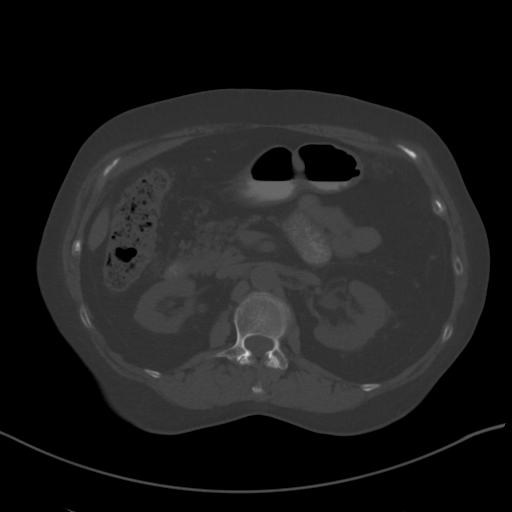
[im 66/94  soft-tissue]
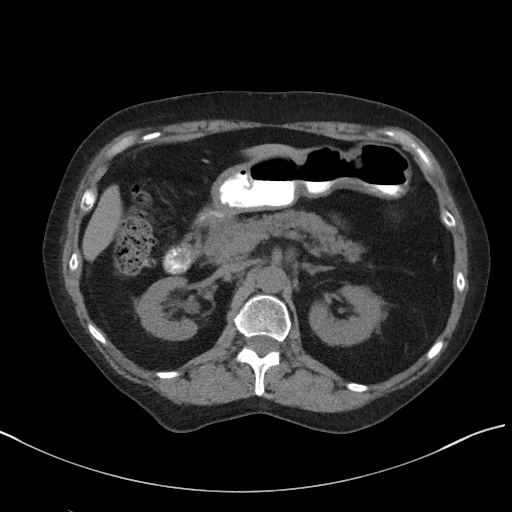
[im 74/94  soft-tissue]
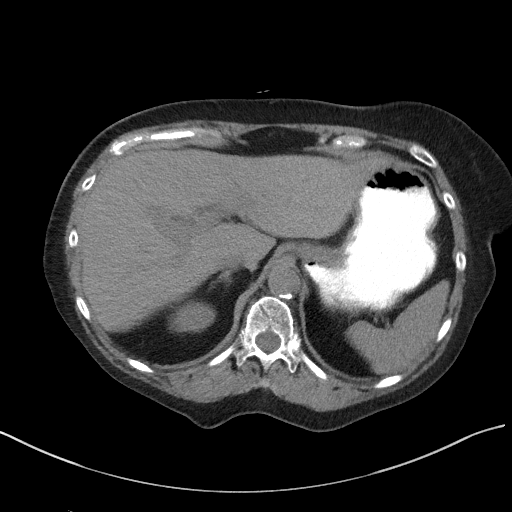
[im 82/94  soft-tissue]
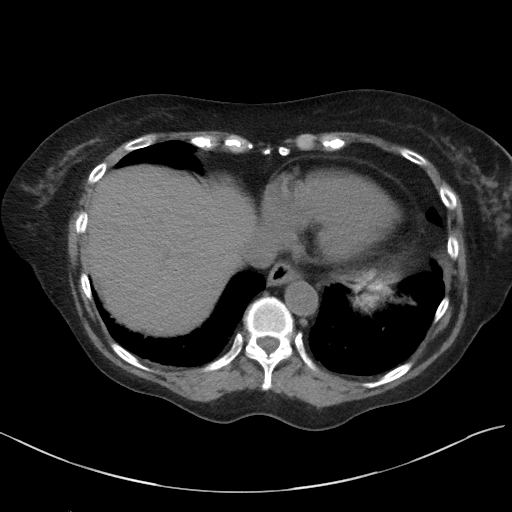
[im 90/94  soft-tissue]
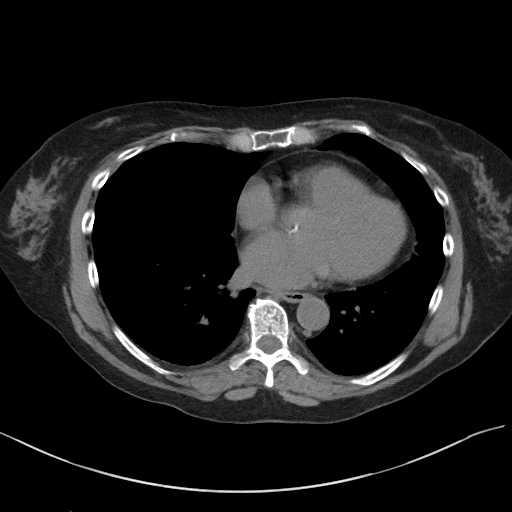

[Series 5: coronal st · coronal · 0.79mm/px · 3 of 104 slices shown]
[im 35/104  soft-tissue]
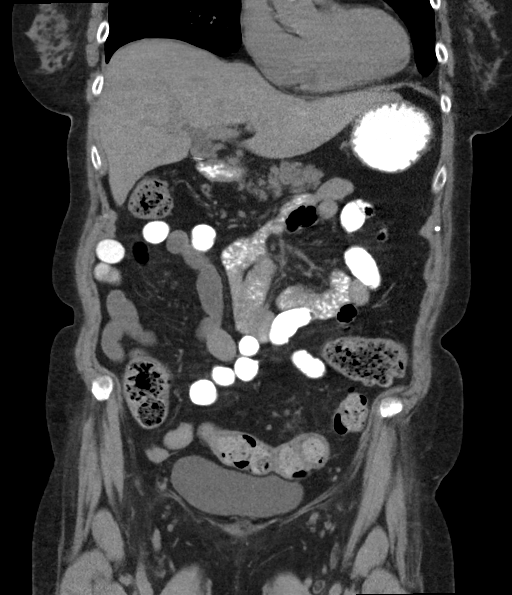
[im 46/104  soft-tissue]
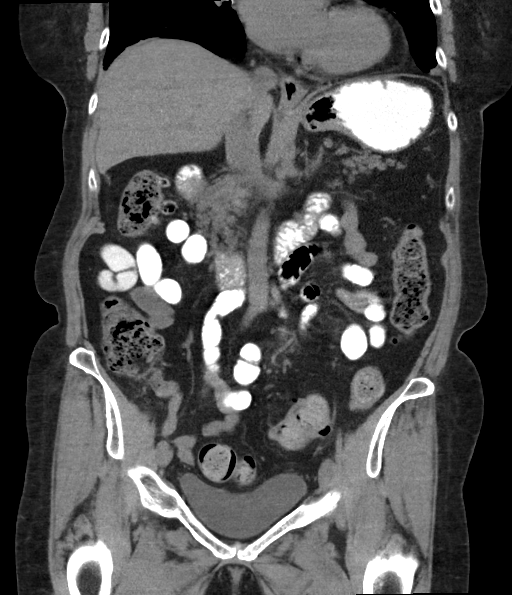
[im 58/104  soft-tissue]
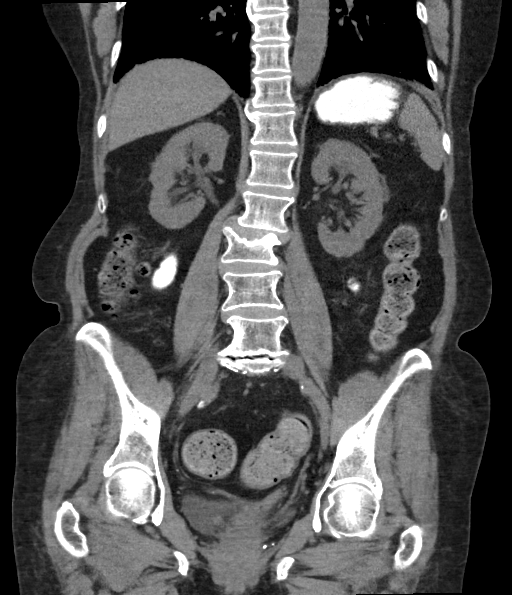

[16 of 46 positions shown; findings below may reference images not displayed]

FINDINGS: Lower chest: Lung bases demonstrate no acute consolidation or
pleural effusion. Heart size within normal limits.

Hepatobiliary: Subcentimeter hypodensity in the dome of the liver,
too small to further characterize but unchanged. Status post
cholecystectomy. Stable enlarged extrahepatic bile duct

Pancreas: Unremarkable. No pancreatic ductal dilatation or
surrounding inflammatory changes.

Spleen: Normal in size without focal abnormality.

Adrenals/Urinary Tract: Adrenal glands are within normal limits.
Prominent renal pelvises without frank hydronephrosis. The bladder
is normal

Stomach/Bowel: Stomach is nonenlarged. No dilated small bowel. Large
volume of stool throughout the colon. Diffuse colon diverticular
disease without acute inflammation. Mild partially visualized
perianal soft tissue thickening.

Vascular/Lymphatic: Mild aortic atherosclerosis. No aneurysmal
dilatation. Small nodes are nodules in the perirectal fat, for
example 8 mm right perirectal nodule, series 2, image number 79.

Reproductive: Status post hysterectomy. No adnexal masses.

Other: Negative for free air or free fluid.

Musculoskeletal: Chronic superior endplate deformity at L3.
IMPRESSION: 1. Large volume of stool within the colon, suggesting constipation.
No bowel obstruction is visualized.
2. Partially visualized soft tissue thickening at the anus,
recommend correlation with direct inspection to exclude mass in the
region.
3. Diffuse colon diverticular disease without acute inflammation
4. Other chronic findings as previously described

## 2019-06-14 IMAGING — DX DG ABDOMEN ACUTE W/ 1V CHEST
3 series · 3 of 3 positions shown · non-contrast
Comparison: Chest radiograph 07/28/2017, abdominal CT 03/20/2016

CLINICAL DATA: Abdominal pain. Patient reports vaginal and rectal
pain.

EXAM:
DG ABDOMEN ACUTE W/ 1V CHEST

[chest pa]
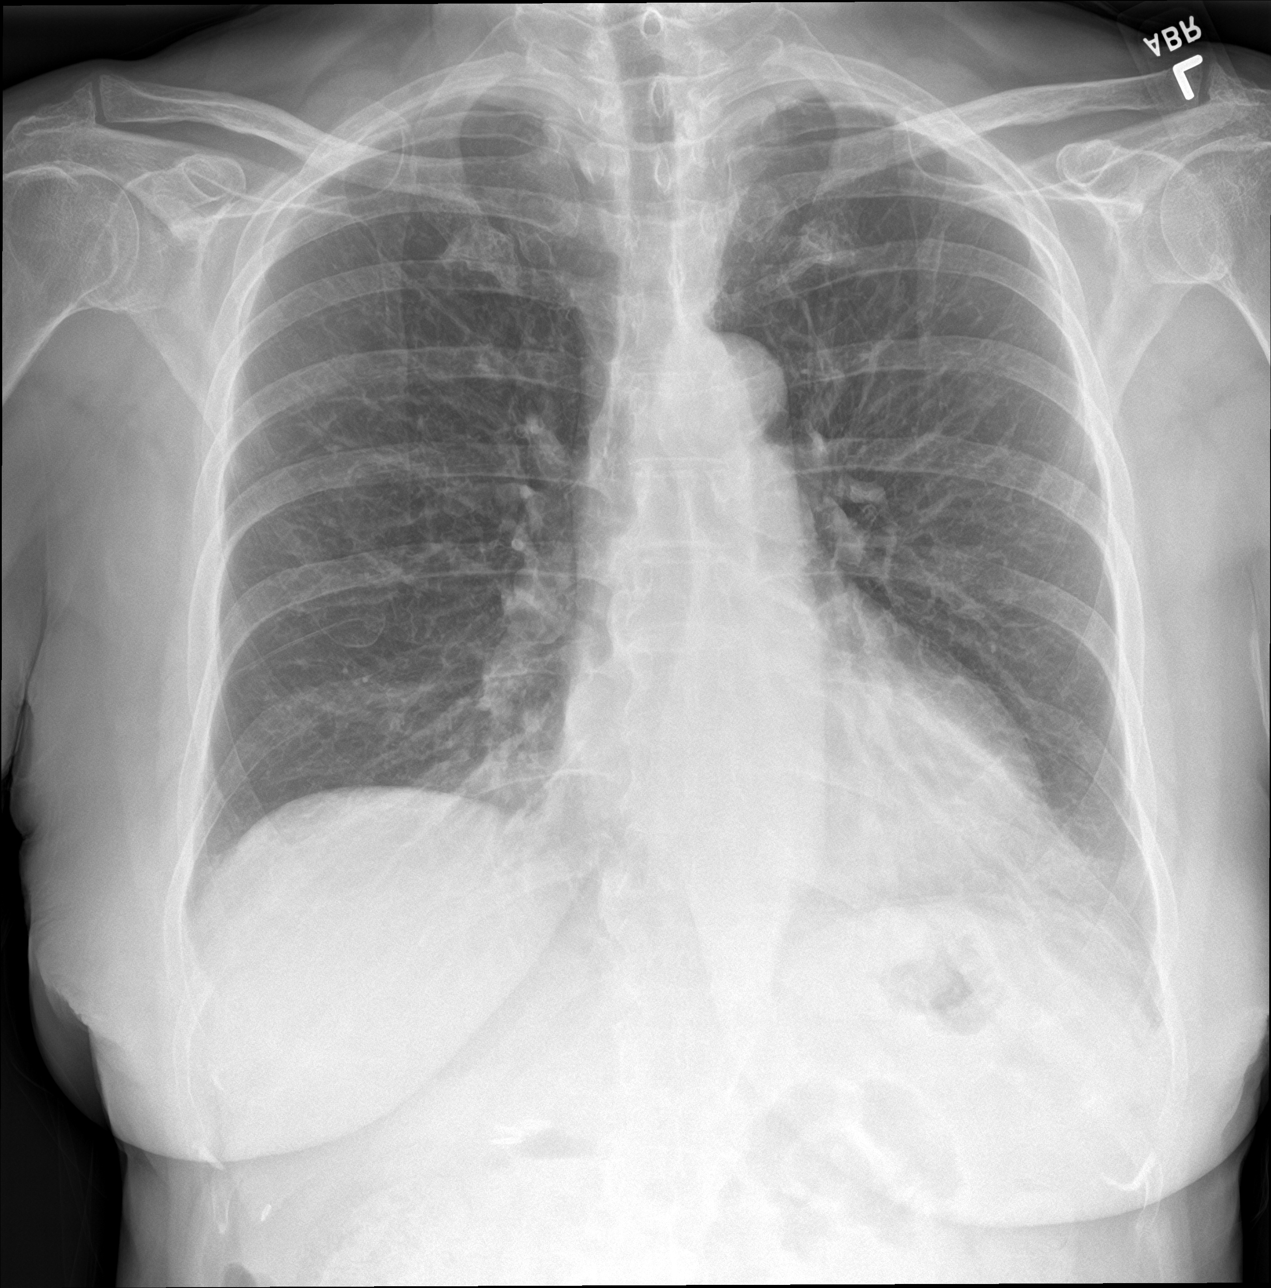

[abdomen erect]
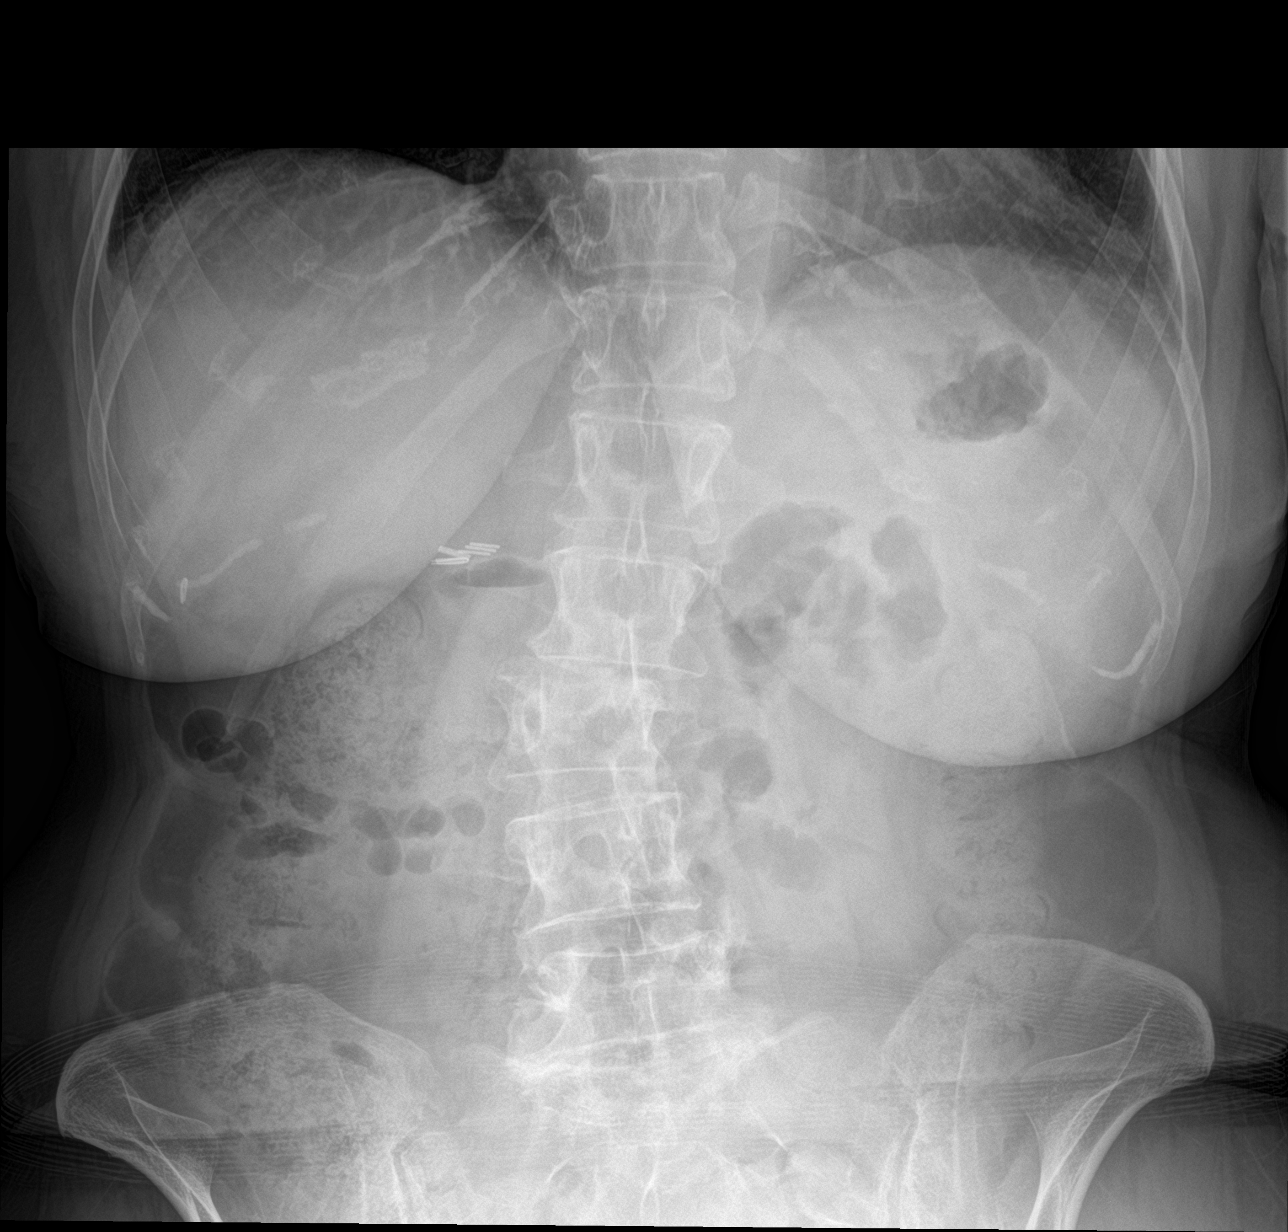

[abdomen supine]
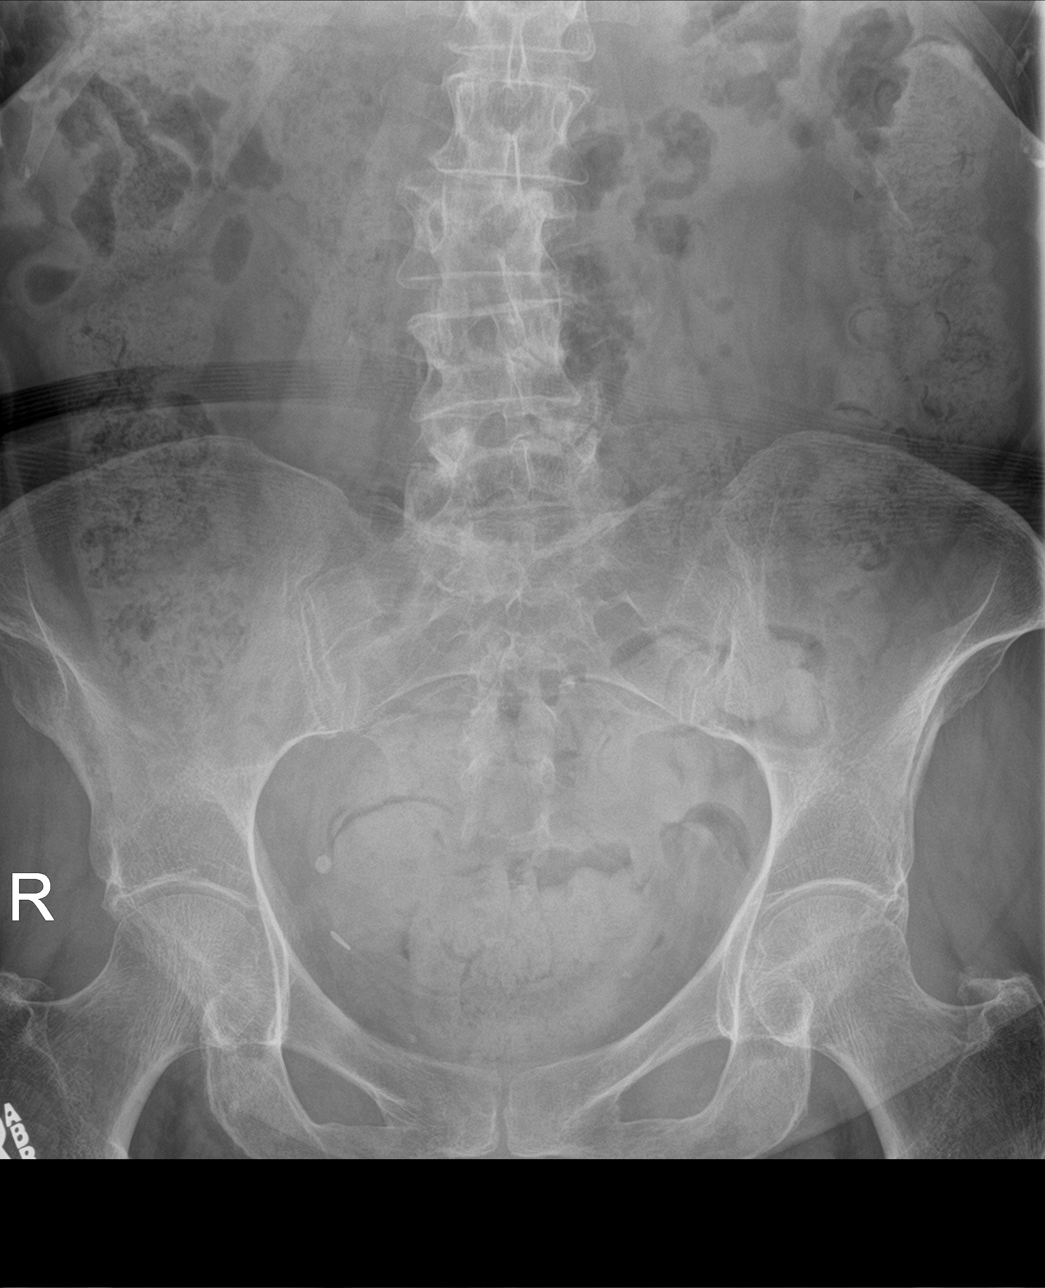

[3 of 3 positions shown; findings below may reference images not displayed]

FINDINGS: The cardiomediastinal contours are normal, similar upper normal
heart size. The lungs are clear. There is no free intra-abdominal
air. No dilated bowel loops to suggest obstruction. Moderate volume
of stool throughout the colon. Cholecystectomy clips in the right
upper quadrant. No radiopaque calculi. There are pelvic phleboliths.
No acute osseous abnormalities are seen. Scoliotic curvature of the
spine.
IMPRESSION: Unremarkable radiographs of the chest and abdomen.

## 2019-07-10 ENCOUNTER — Other Ambulatory Visit: Payer: Self-pay | Admitting: Family Medicine

## 2019-07-10 DIAGNOSIS — M545 Low back pain, unspecified: Secondary | ICD-10-CM

## 2019-07-10 NOTE — Telephone Encounter (Signed)
Last office visit 06/13/2019 Last refill 05/15/2019, #90, no refills

## 2019-07-24 ENCOUNTER — Ambulatory Visit (INDEPENDENT_AMBULATORY_CARE_PROVIDER_SITE_OTHER): Payer: Medicare Other | Admitting: Nurse Practitioner

## 2019-07-24 ENCOUNTER — Other Ambulatory Visit: Payer: Self-pay

## 2019-07-24 ENCOUNTER — Encounter: Payer: Self-pay | Admitting: Nurse Practitioner

## 2019-07-24 VITALS — BP 148/88 | HR 89 | Temp 98.0°F | Resp 20 | Ht 68.0 in | Wt 171.0 lb

## 2019-07-24 DIAGNOSIS — M797 Fibromyalgia: Secondary | ICD-10-CM

## 2019-07-24 DIAGNOSIS — M545 Low back pain, unspecified: Secondary | ICD-10-CM

## 2019-07-24 DIAGNOSIS — N1832 Chronic kidney disease, stage 3b: Secondary | ICD-10-CM | POA: Diagnosis not present

## 2019-07-24 DIAGNOSIS — I10 Essential (primary) hypertension: Secondary | ICD-10-CM

## 2019-07-24 DIAGNOSIS — K5150 Left sided colitis without complications: Secondary | ICD-10-CM

## 2019-07-24 DIAGNOSIS — R110 Nausea: Secondary | ICD-10-CM

## 2019-07-24 DIAGNOSIS — F3342 Major depressive disorder, recurrent, in full remission: Secondary | ICD-10-CM

## 2019-07-24 DIAGNOSIS — E8881 Metabolic syndrome: Secondary | ICD-10-CM

## 2019-07-24 DIAGNOSIS — E785 Hyperlipidemia, unspecified: Secondary | ICD-10-CM | POA: Diagnosis not present

## 2019-07-24 DIAGNOSIS — F5101 Primary insomnia: Secondary | ICD-10-CM

## 2019-07-24 DIAGNOSIS — R001 Bradycardia, unspecified: Secondary | ICD-10-CM

## 2019-07-24 MED ORDER — OXYCODONE HCL 5 MG PO TABS: 5.0000 mg | ORAL_TABLET | Freq: Two times a day (BID) | ORAL | 0 refills | Status: DC

## 2019-07-24 MED ORDER — METHOCARBAMOL 500 MG PO TABS: ORAL_TABLET | ORAL | 1 refills | Status: DC

## 2019-07-24 MED ORDER — ONDANSETRON HCL 4 MG PO TABS: 4.0000 mg | ORAL_TABLET | Freq: Three times a day (TID) | ORAL | 2 refills | Status: DC | PRN

## 2019-07-24 MED ORDER — BUSPIRONE HCL 10 MG PO TABS: 10.0000 mg | ORAL_TABLET | Freq: Three times a day (TID) | ORAL | 2 refills | Status: DC

## 2019-07-24 NOTE — Progress Notes (Signed)
Subjective:    Patient ID: [name], female    DOB: 1941-03-11, 79 y.o.   MRN: [id number]   Chief Complaint: Medical Management of Chronic Issues    HPI:  1. Essential hypertension No c/o chet pain, ob or headache. Doe not check blood preure at home. BP Readings from Last 3 Encounters:  07/24/19 (!) 194/107  08/25/18 140/90  08/22/18 140/90     2. Recurrent major depressive disorder, in full remission ([place]) He was on lexapro and that made her diarrhea wore. She si on seroquel to keep at night and that helps some with her depression. Depression screen [facility] 2/9 07/24/2019 06/13/2019 02/21/2019  Decreased Interest 0 1 1  Down, Depressed, Hopeless 0 1 1  PHQ - 2 Score 0 2 2  Altered sleeping - 0 0  Tired, decreased energy - 0 0  Change in appetite - 0 0  Feeling bad or failure about yourself  - 0 0  Trouble concentrating - 1 0  Moving slowly or fidgety/restless - 0 0  Suicidal thoughts - 0 0  PHQ-9 Score - 3 2  Difficult doing work/chores - Not difficult at all Somewhat difficult  Some recent data might be hidden      3. ULCERATIVE COLITIS, LEFT SIDED Ok today. I bad when diarrhea flare up. She take roxicodone around 2x a day for her pain which helps.  4. Stage 3b chronic kidney disease Lab Results  Component Value Date   CREATININE 1.25 (H) 05/18/2018     5. Bradycardia No near syncopial episode. She has an appointment with DR. Hochrien May 12,2021.  6. Fibromyalgia Is achy mot day, but moving around helps  7. Hyperlipidemia with target LDL less than 100 Doe not watch diet and does very little exercise.  8. Primary insomnia He wakes up evry couple of hours but she does sleep with seroquel  9. Metabolic syndrome Does not really watch diet. Lab Results  Component Value Date   HGBA1C 6.3 05/18/2018      New complaints: None today  Social history: Live by herelf- her dog has been sick  Controlled substance contract: 07/24/19     Review of  Systems  Constitutional: Negative for diaphoresis.  Eyes: Negative for pain.  Respiratory: Negative for shortness of breath.   Cardiovascular: Negative for chest pain, palpitations and leg swelling.  Gastrointestinal: Negative for abdominal pain.  Endocrine: Negative for polydipsia.  Skin: Negative for rash.  Neurological: Negative for dizziness, weakness and headaches.  Hematological: Does not bruise/bleed easily.  All other systems reviewed and are negative.      Objective:   Physical Exam Vitals and nursing note reviewed.  Constitutional:      General: She is not in acute distress.    Appearance: Normal appearance. She is well-developed.  HENT:     Head: Normocephalic.     Nose: Nose normal.  Eyes:     Pupils: Pupils are equal, round, and reactive to light.  Neck:     Vascular: No carotid bruit or JVD.  Cardiovascular:     Rate and Rhythm: Normal rate and regular rhythm.     Heart sounds: Normal heart sounds.  Pulmonary:     Effort: Pulmonary effort is normal. No respiratory distress.     Breath sounds: Normal breath sounds. No wheezing or rales.  Chest:     Chest wall: No tenderness.  Abdominal:     General: Bowel sounds are normal. There is no distension or abdominal bruit.  Palpations: Abdomen is soft. There is no hepatomegaly, splenomegaly, mass or pulsatile mass.     Tenderness: There is abdominal tenderness (diffuse).  Musculoskeletal:        General: Normal range of motion.     Cervical back: Normal range of motion and neck supple.  Lymphadenopathy:     Cervical: No cervical adenopathy.  Skin:    General: Skin is warm and dry.  Neurological:     Mental Status: She is alert and oriented to person, place, and time.     Deep Tendon Reflexes: Reflexes are normal and symmetric.  Psychiatric:        Behavior: Behavior normal.        Thought Content: Thought content normal.        Judgment: Judgment normal.     Blood pressure (!) 148/88, pulse 89,  temperature 98 F (36.7 C), temperature source Temporal, resp. rate 20, height _0  (1.727 m), weight 171 lb (77.6 kg), SpO2 97 %.         Assessment & Plan:  [name] comes in today with chief complaint of Medical Management of Chronic Issues   Diagnosis and orders addressed:  1. Essential hypertension Low odium diet - CBC with Differential/Platelet - CMP14+EGFR  2. Recurrent major depressive disorder, in full remission ([place]) Stress management Changed xanax to bupar 38m po BID prn  3. ULCERATIVE COLITIS, LEFT SIDED - busPIRone (BUSPAR) 10 MG tablet; Take 1 tablet (10 mg total) by mouth 3 (three) times daily.  Dispense: 60 tablet; Refill: 2  4. Stage 3b chronic kidney disease Labs pending  5. Bradycardia Rise slowly from sitting to standing  6. Fibromyalgia Keep muscles warm - oxyCODONE (ROXICODONE) 5 MG immediate release tablet; Take 1 tablet (5 mg total) by mouth 2 (two) times daily.  Dispense: 60 tablet; Refill: 0 - oxyCODONE (ROXICODONE) 5 MG immediate release tablet; Take 1 tablet (5 mg total) by mouth 2 (two) times daily.  Dispense: 60 tablet; Refill: 0 - oxyCODONE (ROXICODONE) 5 MG immediate release tablet; Take 1 tablet (5 mg total) by mouth 2 (two) times daily.  Dispense: 60 tablet; Refill: 0  7. Hyperlipidemia with target LDL less than 100 Low fat diet - Lipid panel  8. Primary insomnia Bedtime routine  9. Metabolic syndrome Watch carb in diet  10. Acute bilateral low back pain without sciatica - methocarbamol (ROBAXIN) 500 MG tablet; TAKE 1 TABLET EVERY 8 HOURS AS NEEDED FOR MUSCLE SPAAMS  Dispense: 30 tablet; Refill: 1  11. Nausea without vomiting - ondansetron (ZOFRAN) 4 MG tablet; Take 1 tablet (4 mg total) by mouth every 8 (eight) hours as needed for nausea or vomiting.  Dispense: 20 tablet; Refill: 2   Labs pending Health Maintenance reviewed Diet and exercise encouraged  Follow up plan: 3 months   Mary-Margaret MHassell Done FNP

## 2019-07-24 NOTE — Patient Instructions (Signed)

## 2019-07-25 LAB — CBC WITH DIFFERENTIAL/PLATELET
Basophils Absolute: 0.1 10*3/uL (ref 0.0–0.2)
Basos: 1 %
EOS (ABSOLUTE): 0.1 10*3/uL (ref 0.0–0.4)
Eos: 2 %
Hematocrit: 41.9 % (ref 34.0–46.6)
Hemoglobin: 13.8 g/dL (ref 11.1–15.9)
Immature Grans (Abs): 0 10*3/uL (ref 0.0–0.1)
Immature Granulocytes: 0 %
Lymphocytes Absolute: 1.9 10*3/uL (ref 0.7–3.1)
Lymphs: 29 %
MCH: 30.2 pg (ref 26.6–33.0)
MCHC: 32.9 g/dL (ref 31.5–35.7)
MCV: 92 fL (ref 79–97)
Monocytes Absolute: 0.4 10*3/uL (ref 0.1–0.9)
Monocytes: 6 %
Neutrophils Absolute: 3.9 10*3/uL (ref 1.4–7.0)
Neutrophils: 62 %
Platelets: 290 10*3/uL (ref 150–450)
RBC: 4.57 x10E6/uL (ref 3.77–5.28)
RDW: 13.3 % (ref 11.7–15.4)
WBC: 6.3 10*3/uL (ref 3.4–10.8)

## 2019-07-25 LAB — CMP14+EGFR
ALT: 25 IU/L (ref 0–32)
AST: 24 IU/L (ref 0–40)
Albumin/Globulin Ratio: 1.6 (ref 1.2–2.2)
Albumin: 4.3 g/dL (ref 3.7–4.7)
Alkaline Phosphatase: 77 IU/L (ref 39–117)
BUN/Creatinine Ratio: 13 (ref 12–28)
BUN: 16 mg/dL (ref 8–27)
Bilirubin Total: 0.5 mg/dL (ref 0.0–1.2)
CO2: 22 mmol/L (ref 20–29)
Calcium: 9.7 mg/dL (ref 8.7–10.3)
Chloride: 100 mmol/L (ref 96–106)
Creatinine, Ser: 1.19 mg/dL — ABNORMAL HIGH (ref 0.57–1.00)
GFR calc Af Amer: 51 mL/min/{1.73_m2} — ABNORMAL LOW (ref >59)
GFR calc non Af Amer: 44 mL/min/{1.73_m2} — ABNORMAL LOW (ref >59)
Globulin, Total: 2.7 g/dL (ref 1.5–4.5)
Glucose: 151 mg/dL — ABNORMAL HIGH (ref 65–99)
Potassium: 5.1 mmol/L (ref 3.5–5.2)
Sodium: 139 mmol/L (ref 134–144)
Total Protein: 7 g/dL (ref 6.0–8.5)

## 2019-07-25 LAB — LIPID PANEL
Chol/HDL Ratio: 6.7 ratio — ABNORMAL HIGH (ref 0.0–4.4)
Cholesterol, Total: 241 mg/dL — ABNORMAL HIGH (ref 100–199)
HDL: 36 mg/dL — ABNORMAL LOW (ref >39)
LDL Chol Calc (NIH): 140 mg/dL — ABNORMAL HIGH (ref 0–99)
Triglycerides: 358 mg/dL — ABNORMAL HIGH (ref 0–149)
VLDL Cholesterol Cal: 65 mg/dL — ABNORMAL HIGH (ref 5–40)

## 2019-07-31 MED ORDER — ROSUVASTATIN CALCIUM 10 MG PO TABS
10.0000 mg | ORAL_TABLET | Freq: Every day | ORAL | 1 refills | Status: DC
Start: 2019-07-31 — End: 2020-04-11

## 2019-07-31 NOTE — Addendum Note (Signed)
Addended by: [name] on: 07/31/2019 08:03 AM   Modules accepted: Orders

## 2019-08-01 DIAGNOSIS — R011 Cardiac murmur, unspecified: Secondary | ICD-10-CM | POA: Insufficient documentation

## 2019-08-01 DIAGNOSIS — Z7189 Other specified counseling: Secondary | ICD-10-CM | POA: Insufficient documentation

## 2019-08-01 NOTE — Progress Notes (Signed)
Cardiology Office Note   Date:  08/02/2019   ID:  [name], DOB 09/03/1940, MRN [id number]  PCP:  [name], FNP  Cardiologist:   No primary care provider on file.   Chief Complaint  Patient presents with  . Heart Murmur      History of Present Illness: [name] is a 79 y.o. female who presents for follow up of chest pain and a murmur.  She saw Dr. Harl Bowie for this.  The pain was felt to be atypical.  She saw him in 2019.  She did have an negative Lexiscan Myoview in 2007.  She was to have an echo after her last appt but I don't see that this was done.   She said that for some reason that she has been get scheduled.  She is back for routine follow-up.  She is having some right shoulder pain and does remember doing anything to it but it hurts to move her shoulder.  She is seeing a Restaurant manager, fast food.  It is a sharp discomfort.  She is not having any chest pressure, neck or left arm discomfort.  She is not having any new shortness of breath, PND orthopnea.  She has had no palpitations, presyncope or syncope.  Of note she was doing yard work about a month ago before her shoulder started bothering her and she was not having any problems with this.   Past Medical History:  Diagnosis Date  . Anemia due to blood loss 05/02/2011  . Anxiety   . Arthritis    rheumatoid   . Collagen vascular disease ([place])    ra  . Fibromyalgia   . Hyperglycemia, drug-induced 05/02/2011  . Hypertension   . Ulcerative colitis     Past Surgical History:  Procedure Laterality Date  . ABDOMINAL HYSTERECTOMY    . BIOPSY N/A 12/28/2014   Procedure: SIGMOID AND RECTAL BIOPSIES;  Surgeon: Rogene Houston, MD;  Location: [facility];  Service: Endoscopy;  Laterality: N/A;  . CHOLECYSTECTOMY     s/p  . COLONOSCOPY  06/16/06. 04/27/2007   proctitis seen, biopsies showed chronic active colitis, no dysplasia   . EYE SURGERY     clear lense extraction 1998   . FLEXIBLE SIGMOIDOSCOPY N/A 12/28/2014   Procedure: FLEXIBLE SIGMOIDOSCOPY WITH PROPOFOL;  Surgeon: Rogene Houston, MD;  Location: [facility];  Service: Endoscopy;  Laterality: N/A;     Current Outpatient Medications  Medication Sig Dispense Refill  . benazepril-hydrochlorthiazide (LOTENSIN HCT) 20-12.5 MG tablet Take 2 tablets by mouth daily. TAKE (2) TABLETS  BY MOUTH ONCE DAILY. 180 tablet 3  . busPIRone (BUSPAR) 10 MG tablet Take 1 tablet (10 mg total) by mouth 3 (three) times daily. 60 tablet 2  . co-enzyme Q-10 50 MG capsule Take 50 mg by mouth once a week.     . dicyclomine (BENTYL) 20 MG tablet Take 0.5-1 tablets (10-20 mg total) by mouth daily. 90 tablet 1  . hydrocortisone (ANUSOL-HC) 2.5 % rectal cream Place 1 application rectally 2 (two) times daily. 30 g 0  . hydrocortisone (ANUSOL-HC) 25 MG suppository Place 1 suppository (25 mg total) rectally 2 (two) times daily. 12 suppository 0  . Loperamide HCl (IMODIUM PO) Take 1 tablet by mouth daily as needed (diarrhea).    . loratadine-pseudoephedrine (CLARITIN-D 12-HOUR) 5-120 MG tablet Take 1 tablet by mouth every 12 (twelve) hours.    . methocarbamol (ROBAXIN) 500 MG tablet TAKE 1 TABLET EVERY 8 HOURS AS NEEDED FOR MUSCLE SPAAMS 30  tablet 1  . Multiple Vitamins-Minerals (WOMENS MULTI VITAMIN & MINERAL PO) Take 1 tablet by mouth every other day.     . Omega-3 Fatty Acids (FISH OIL) 1000 MG CPDR Take 3 capsules by mouth daily.     . ondansetron (ZOFRAN) 4 MG tablet Take 1 tablet (4 mg total) by mouth every 8 (eight) hours as needed for nausea or vomiting. 20 tablet 2  . OVER THE COUNTER MEDICATION Take 1 tablet by mouth daily. Vitamin K -2 Patient states that she takes daily with her Vitamin D.     . [START ON 11/13/2019] oxyCODONE (ROXICODONE) 5 MG immediate release tablet Take 1 tablet (5 mg total) by mouth 2 (two) times daily. 60 tablet 0  . [START ON 10/14/2019] oxyCODONE (ROXICODONE) 5 MG immediate release tablet Take 1 tablet (5 mg total) by mouth 2 (two) times daily. 60 tablet  0  . Probiotic Product (PROBIOTIC FORMULA PO) Take 1-2 capsules by mouth every morning.     Marland Kitchen QUEtiapine (SEROQUEL) 100 MG tablet Take 1 tablet (100 mg total) by mouth at bedtime. 90 tablet 1  . rosuvastatin (CRESTOR) 10 MG tablet Take 1 tablet (10 mg total) by mouth daily. 90 tablet 1  . shark liver oil-cocoa butter (PREPARATION H) 0.25-3-85.5 % suppository Place 1 suppository rectally as needed for hemorrhoids.    . vitamin E 400 UNIT capsule Take 400 Units by mouth every morning.     Marland Kitchen antiseptic oral rinse (BIOTENE) LIQD 1 application by Mouth Rinse route daily. FOR DRY MOUTH    . [START ON 09/14/2019] oxyCODONE (ROXICODONE) 5 MG immediate release tablet Take 1 tablet (5 mg total) by mouth 2 (two) times daily. 60 tablet 0   No current facility-administered medications for this visit.    Allergies:   Azathioprine, Tape, Codeine, Lac bovis, Penicillins, Povidone-iodine, Procaine hcl, Sulfonamide derivatives, Tramadol, Acyclovir and related, Azithromycin, Clonazepam, Dairy aid [lactase], Doxycycline, Latex, and Niacin and related    ROS:  Please see the history of present illness.   Otherwise, review of systems are positive for none.   All other systems are reviewed and negative.    PHYSICAL EXAM: VS:  BP (!) 152/88   Pulse 88   Ht 5' 9"  (1.753 m)   Wt 169 lb (76.7 kg)   BMI 24.96 kg/m  , BMI Body mass index is 24.96 kg/m. GENERAL:  Well appearing HEENT:  Pupils equal round and reactive, fundi not visualized, oral mucosa unremarkable NECK:  No jugular venous distention, waveform within normal limits, carotid upstroke brisk and symmetric, no bruits, no thyromegaly LYMPHATICS:  No cervical, inguinal adenopathy LUNGS:  Clear to auscultation bilaterally BACK:  No CVA tenderness CHEST:  Unremarkable HEART:  PMI not displaced or sustained,S1 and S2 within normal limits, no S3, no S4, no clicks, no rubs, no murmurs ABD:  Flat, positive bowel sounds normal in frequency in pitch, no bruits,  no rebound, no guarding, no midline pulsatile mass, no hepatomegaly, no splenomegaly EXT:  2 plus pulses throughout, no edema, no cyanosis no clubbing SKIN:  No rashes no nodules NEURO:  Cranial nerves II through XII grossly intact, motor grossly intact throughout PSYCH:  Cognitively intact, oriented to person place and time    EKG:  EKG is ordered today. The ekg ordered today demonstrates low voltage in the limb in chest leads, sinus rhythm, rate 88, nonspecific T wave flattening, no acute ST-T wave changes.   Recent Labs: 07/24/2019: ALT 25; BUN 16; Creatinine, Ser 1.19; Hemoglobin  13.8; Platelets 290; Potassium 5.1; Sodium 139    Lipid Panel    Component Value Date/Time   CHOL 241 (H) 07/24/2019 1537   CHOL 200 (H) 08/01/2012 1100   TRIG 358 (H) 07/24/2019 1537   TRIG 294 (H) 06/08/2014 1639   TRIG 171 (H) 08/01/2012 1100   HDL 36 (L) 07/24/2019 1537   HDL 44 06/08/2014 1639   HDL 55 08/01/2012 1100   CHOLHDL 6.7 (H) 07/24/2019 1537   CHOLHDL 6.1 03/04/2007 0420   VLDL 26 03/04/2007 0420   LDLCALC 140 (H) 07/24/2019 1537   LDLCALC 81 12/28/2013 1106   LDLCALC 111 (H) 08/01/2012 1100      Wt Readings from Last 3 Encounters:  08/02/19 169 lb (76.7 kg)  07/24/19 171 lb (77.6 kg)  08/25/18 163 lb (73.9 kg)      Other studies Reviewed: Additional studies/ records that were reviewed today include: Labs. Review of the above records demonstrates:  Please see elsewhere in the note.     ASSESSMENT AND PLAN:  CHEST PAIN:   She does not have chest pain that she described previously.  No further work-up.  MURMUR: I do not appreciate a heart murmur.  I would not suggest further testing is indicated.  HTN: Her blood pressure is not well controlled.  Of asked her to get up blood pressure diary.  I am going to increase benazepril HCT to 40/25. Marland Kitchen  She can double up on the pills she has until she has run out of these.  COVID EDUCATION: We talked about the vaccine.  She does not  want to get it.  Current medicines are reviewed at length with the patient today.  The patient does not have concerns regarding medicines.  The following changes have been made:  no change  Labs/ tests ordered today include: None  Orders Placed This Encounter  Procedures  . EKG 12-Lead     Disposition:   FU with me as needed.  I will be happy to review her blood pressure diary.   Signed, Minus Breeding, MD  08/02/2019 1:45 PM    [facility]

## 2019-08-02 ENCOUNTER — Encounter: Payer: Self-pay | Admitting: Cardiology

## 2019-08-02 ENCOUNTER — Ambulatory Visit: Payer: Medicare Other | Admitting: Cardiology

## 2019-08-02 VITALS — BP 152/88 | HR 88 | Ht 69.0 in | Wt 169.0 lb

## 2019-08-02 DIAGNOSIS — R072 Precordial pain: Secondary | ICD-10-CM

## 2019-08-02 DIAGNOSIS — Z7189 Other specified counseling: Secondary | ICD-10-CM

## 2019-08-02 DIAGNOSIS — R011 Cardiac murmur, unspecified: Secondary | ICD-10-CM | POA: Diagnosis not present

## 2019-08-02 DIAGNOSIS — I10 Essential (primary) hypertension: Secondary | ICD-10-CM | POA: Diagnosis not present

## 2019-08-02 MED ORDER — BENAZEPRIL-HYDROCHLOROTHIAZIDE 20-12.5 MG PO TABS: 2.0000 | ORAL_TABLET | Freq: Every day | ORAL | 3 refills | Status: DC

## 2019-08-02 NOTE — Patient Instructions (Addendum)
Medication Instructions:  Please increase your Benazepril-hydrochlorthiazide 20-12.5 mg to (2) tablets daily for a total of 40-25 mg daily. Continue all other medications as listed.  *If you need a refill on your cardiac medications before your next appointment, please call your pharmacy*  Follow-Up: At [facility], you and your health needs are our priority.  As part of our continuing mission to provide you with exceptional heart care, we have created designated Provider Care Teams.  These Care Teams include your primary Cardiologist (physician) and Advanced Practice Providers (APPs -  Physician Assistants and Nurse Practitioners) who all work together to provide you with the care you need, when you need it.  We recommend signing up for the patient portal called "MyChart".  Sign up information is provided on this After Visit Summary.  MyChart is used to connect with patients for Virtual Visits (Telemedicine).  Patients are able to view lab/test results, encounter notes, upcoming appointments, etc.  Non-urgent messages can be sent to your provider as well.   To learn more about what you can do with MyChart, go to [email or web address].    Follow up as needed with Dr Percival Spanish.  Thank you for choosing [facility]!!     [facility]

## 2019-09-15 ENCOUNTER — Other Ambulatory Visit: Payer: Self-pay | Admitting: Nurse Practitioner

## 2019-09-15 DIAGNOSIS — F3342 Major depressive disorder, recurrent, in full remission: Secondary | ICD-10-CM

## 2019-09-15 NOTE — Telephone Encounter (Signed)
Alprazolam was changed to Buspar at last OV with MMM

## 2019-09-27 ENCOUNTER — Ambulatory Visit (INDEPENDENT_AMBULATORY_CARE_PROVIDER_SITE_OTHER): Payer: Medicare Other | Admitting: *Deleted

## 2019-09-27 DIAGNOSIS — Z0000 Encounter for general adult medical examination without abnormal findings: Secondary | ICD-10-CM | POA: Diagnosis not present

## 2019-09-27 NOTE — Progress Notes (Signed)
MEDICARE ANNUAL WELLNESS VISIT  09/27/2019  Telephone Visit Disclaimer This Medicare AWV was conducted by telephone due to national recommendations for restrictions regarding the COVID-19 Pandemic (e.g. social distancing).  I verified, using two identifiers, that I am speaking with [name] or their authorized healthcare agent. I discussed the limitations, risks, security, and privacy concerns of performing an evaluation and management service by telephone and the potential availability of an in-person appointment in the future. The patient expressed understanding and agreed to proceed.   Subjective:  [name] is a 79 y.o. female patient of [name], [place] who had a Medicare Annual Wellness Visit today via telephone. [name] is Retired and lives alone. she has 0 biologic children but has 3 stepchildren. she reports that she is socially active and does interact with friends/family regularly. she is moderately physically active and enjoys gardening, canning fruits and vegetables and playing with her dog.  Patient Care Team: [name], FNP as PCP - General (Nurse Practitioner)  Advanced Directives 09/27/2019 08/25/2018 10/13/2017 09/03/2017 07/28/2017 04/15/2017 05/30/2015  Does Patient Have a Medical Advance Directive? Yes Yes No;Yes Yes Yes No Yes  Type of Paramedic of [place];Living will Living will;Healthcare Power of [place];Living will Healthcare Power of [place];Living will - [place];Living will  Does patient want to make changes to medical advance directive? No - Patient declined - No - Patient declined - - - -  Copy of [place] in Chart? No - copy requested No - copy requested No - copy requested No - copy requested No - copy requested - -  Would patient like information on creating a medical advance directive? - - No - Patient declined - No -  Patient declined No - Patient declined -  Pre-existing out of facility DNR order (yellow form or pink MOST form) - - - - - - -    Hospital Utilization Over the Past 12 Months: # of hospitalizations or ER visits: 0 # of surgeries: 0  Review of Systems    Patient reports that her overall health is unchanged compared to last year.  History obtained from chart review and the patient  Patient Reported Readings (BP, Pulse, CBG, Weight, etc) none  Pain Assessment Pain : No/denies pain     Current Medications & Allergies (verified) Allergies as of 09/27/2019      Reactions   Azathioprine Nausea And Vomiting, Other (See Comments)   SEVERE NAUSEA AND VOMITING WITH CHEST PAIN-  PATIENT INSISTS NEVER TO BE GIVEN ANYTHING SIMILAR TO THIS   Tape Other (See Comments), Rash   BLISTERS AND SKIN TEARING   Codeine Nausea And Vomiting   Lac Bovis Nausea And Vomiting   Penicillins Hives   Has patient had a PCN reaction causing immediate rash, facial/tongue/throat swelling, SOB or lightheadedness with hypotension: No Has patient had a PCN reaction causing severe rash involving mucus membranes or skin necrosis: No Has patient had a PCN reaction that required hospitalization: No Has patient had a PCN reaction occurring within the last 10 years: No If all of the above answers are "NO", then may proceed with Cephalosporin use.   Povidone-iodine Hives   BETADINE   Procaine Hcl Other (See Comments)   SEVERE GI UPSET (NAUSEA,VOMITING AND DIARRHEA)   Sulfonamide Derivatives Hives   Tramadol Itching   Acyclovir And Related    Azithromycin    Mycins    Clonazepam Other (  See Comments)   Skin burning, insomnia, diarrhea.   Dairy Aid [lactase]    Doxycycline Rash   Latex Rash   Niacin And Related Rash      Medication List       Accurate as of September 27, 2019 12:22 PM. If you have any questions, ask your nurse or doctor.        STOP taking these medications   loratadine-pseudoephedrine 5-120 MG  tablet Commonly known as: CLARITIN-D 12-hour     TAKE these medications   antiseptic oral rinse Liqd 1 application by Mouth Rinse route daily. FOR DRY MOUTH   benazepril-hydrochlorthiazide 20-12.5 MG tablet Commonly known as: LOTENSIN HCT Take 2 tablets by mouth daily. TAKE (2) TABLETS  BY MOUTH ONCE DAILY.   busPIRone 10 MG tablet Commonly known as: BUSPAR Take 1 tablet (10 mg total) by mouth 3 (three) times daily.   co-enzyme Q-10 50 MG capsule Take 50 mg by mouth once a week.   dicyclomine 20 MG tablet Commonly known as: BENTYL Take 0.5-1 tablets (10-20 mg total) by mouth daily.   Fish Oil 1000 MG Cpdr Take 3 capsules by mouth daily.   hydrocortisone 2.5 % rectal cream Commonly known as: Anusol-HC Place 1 application rectally 2 (two) times daily.   hydrocortisone 25 MG suppository Commonly known as: ANUSOL-HC Place 1 suppository (25 mg total) rectally 2 (two) times daily.   IMODIUM PO Take 1 tablet by mouth daily as needed (diarrhea).   methocarbamol 500 MG tablet Commonly known as: ROBAXIN TAKE 1 TABLET EVERY 8 HOURS AS NEEDED FOR MUSCLE SPAAMS   ondansetron 4 MG tablet Commonly known as: ZOFRAN Take 1 tablet (4 mg total) by mouth every 8 (eight) hours as needed for nausea or vomiting.   OVER THE COUNTER MEDICATION Take 1 tablet by mouth daily. Vitamin K -2 Patient states that she takes daily with her Vitamin D.   oxyCODONE 5 MG immediate release tablet Commonly known as: Roxicodone Take 1 tablet (5 mg total) by mouth 2 (two) times daily.   oxyCODONE 5 MG immediate release tablet Commonly known as: Roxicodone Take 1 tablet (5 mg total) by mouth 2 (two) times daily. Start taking on: October 14, 2019   oxyCODONE 5 MG immediate release tablet Commonly known as: Roxicodone Take 1 tablet (5 mg total) by mouth 2 (two) times daily. Start taking on: November 13, 2019   PROBIOTIC FORMULA PO Take 1-2 capsules by mouth every morning.   QUEtiapine 100 MG  tablet Commonly known as: SEROquel Take 1 tablet (100 mg total) by mouth at bedtime.   rosuvastatin 10 MG tablet Commonly known as: Crestor Take 1 tablet (10 mg total) by mouth daily.   shark liver oil-cocoa butter 0.25-3-85.5 % suppository Commonly known as: PREPARATION H Place 1 suppository rectally as needed for hemorrhoids.   vitamin E 180 MG (400 UNITS) capsule Take 400 Units by mouth every morning.   WOMENS MULTI VITAMIN & MINERAL PO Take 1 tablet by mouth every other day.       History (reviewed): Past Medical History:  Diagnosis Date  . Anemia due to blood loss 05/02/2011  . Anxiety   . Arthritis    rheumatoid   . Collagen vascular disease ([place])    ra  . Fibromyalgia   . Hyperglycemia, drug-induced 05/02/2011  . Hypertension   . Ulcerative colitis    Past Surgical History:  Procedure Laterality Date  . ABDOMINAL HYSTERECTOMY    . BIOPSY N/A 12/28/2014  Procedure: SIGMOID AND RECTAL BIOPSIES;  Surgeon: Rogene Houston, MD;  Location: [facility];  Service: Endoscopy;  Laterality: N/A;  . CHOLECYSTECTOMY     s/p  . COLONOSCOPY  06/16/06. 04/27/2007   proctitis seen, biopsies showed chronic active colitis, no dysplasia   . EYE SURGERY     clear lense extraction 1998   . FLEXIBLE SIGMOIDOSCOPY N/A 12/28/2014   Procedure: FLEXIBLE SIGMOIDOSCOPY WITH PROPOFOL;  Surgeon: Rogene Houston, MD;  Location: [facility];  Service: Endoscopy;  Laterality: N/A;   Family History  Problem Relation Age of Onset  . Heart attack Mother 33       Died in her sleep  . Diabetes Mother   . Heart attack Father 26       Died in her sleep  . Heart attack Brother 40       Defib, CABG  . Diabetes Brother   . Kidney disease Brother        dyalasis   . Cancer Sister        liver, breast   . Heart disease Sister    Social History   Socioeconomic History  . Marital status: Widowed    Spouse name: Not on file  . Number of children: 0  . Years of education: 60  . Highest education level:  High school graduate  Occupational History  . Occupation: retired   Tobacco Use  . Smoking status: Never Smoker  . Smokeless tobacco: Never Used  Vaping Use  . Vaping Use: Never used  Substance and Sexual Activity  . Alcohol use: Never    Alcohol/week: 0.0 standard drinks    Comment: Ocassionally a small glass of wine. Patient states that is has been 2 months since she drank anything.  . Drug use: No  . Sexual activity: Not Currently  Other Topics Concern  . Not on file  Social History Narrative   Lives alone.  3  step children living    Social Determinants of Health   Financial Resource Strain: Low Risk   . Difficulty of Paying Living Expenses: Not hard at all  Food Insecurity: No Food Insecurity  . Worried About Charity fundraiser in the Last Year: Never true  . Ran Out of Food in the Last Year: Never true  Transportation Needs: No Transportation Needs  . Lack of Transportation (Medical): No  . Lack of Transportation (Non-Medical): No  Physical Activity: Sufficiently Active  . Days of Exercise per Week: 7 days  . Minutes of Exercise per Session: 30 min  Stress: Stress Concern Present  . Feeling of Stress : Rather much  Social Connections: Moderately Integrated  . Frequency of Communication with Friends and Family: More than three times a week  . Frequency of Social Gatherings with Friends and Family: More than three times a week  . Attends Religious Services: More than 4 times per year  . Active Member of Clubs or Organizations: Yes  . Attends Archivist Meetings: More than 4 times per year  . Marital Status: Widowed    Activities of Daily Living In your present state of health, do you have any difficulty performing the following activities: 09/27/2019  Hearing? N  Vision? N  Comment has appt tomorrow for routine eye exam  Difficulty concentrating or making decisions? N  Walking or climbing stairs? N  Dressing or bathing? N  Doing errands, shopping? N   Preparing Food and eating ? N  Using the Toilet? N  In  the past six months, have you accidently leaked urine? Y  Comment once or twice  Do you have problems with loss of bowel control? N  Managing your Medications? N  Managing your Finances? N  Housekeeping or managing your Housekeeping? N  Some recent data might be hidden    Patient Education/ Literacy How often do you need to have someone help you when you read instructions, pamphlets, or other written materials from your doctor or pharmacy?: 1 - Never What is the last grade level you completed in school?: 12th grade  Exercise Current Exercise Habits: Home exercise routine, Type of exercise: walking, Time (Minutes): 30, Frequency (Times/Week): 7, Weekly Exercise (Minutes/Week): 210, Intensity: Mild, Exercise limited by: orthopedic condition(s)  Diet Patient reports consuming 3 meals a day and 2 snack(s) a day Patient reports that her primary diet is: Regular Patient reports that she does have regular access to food.   Depression Screen PHQ 2/9 Scores 09/27/2019 07/24/2019 06/13/2019 02/21/2019 11/22/2018 08/25/2018 08/22/2018  PHQ - 2 Score 0 0 2 2 0 0 0  PHQ- 9 Score - - 3 2 - - -     Fall Risk Fall Risk  09/27/2019 07/24/2019 06/13/2019 02/21/2019 11/22/2018  Falls in the past year? 0 0 0 0 0  Number falls in past yr: - - - - -  Injury with Fall? - - - - -     Objective:  [name] seemed alert and oriented and she participated appropriately during our telephone visit.  Blood Pressure Weight BMI  BP Readings from Last 3 Encounters:  08/02/19 (!) 152/88  07/24/19 (!) 148/88  08/25/18 140/90   Wt Readings from Last 3 Encounters:  08/02/19 169 lb (76.7 kg)  07/24/19 171 lb (77.6 kg)  08/25/18 163 lb (73.9 kg)   BMI Readings from Last 1 Encounters:  08/02/19 24.96 kg/m    *Unable to obtain current vital signs, weight, and BMI due to telephone visit type  Hearing/Vision  . Kier[place] did not seem to have difficulty with  hearing/understanding during the telephone conversation . Reports that she has had a formal eye exam by an eye care professional within the past year . Reports that she has not had a formal hearing evaluation within the past year *Unable to fully assess hearing and vision during telephone visit type  Cognitive Function: 6CIT Screen 09/27/2019 08/25/2018  What Year? 0 points 0 points  What month? 0 points 0 points  What time? 0 points 0 points  Count back from 20 0 points -  Months in reverse 0 points -  Repeat phrase 2 points 4 points  Total Score 2 -   (Normal:0-7, Significant for Dysfunction: >8)  Normal Cognitive Function Screening: Yes   Immunization & Health Maintenance Record Immunization History  Administered Date(s) Administered  . Hepatitis B 09/13/1990, 10/14/1990, 03/29/1991  . Influenza, High Dose Seasonal PF 12/18/2016  . Influenza,inj,Quad PF,6+ Mos 01/04/2015, 02/06/2016  . Influenza,inj,quad, With Preservative 01/17/2014  . Influenza-Unspecified 12/24/2009  . PPD Test 03/13/2013  . Pneumococcal Conjugate-13 01/29/2015  . Pneumococcal Polysaccharide-23 03/24/2011  . Tdap 10/20/2012    Health Maintenance  Topic Date Due  . Hepatitis C Screening  Never done  . COVID-19 Vaccine (1) Never done  . OPHTHALMOLOGY EXAM  02/17/2014  . FOOT EXAM  02/09/2018  . MAMMOGRAM  06/29/2018  . HEMOGLOBIN A1C  11/16/2018  . INFLUENZA VACCINE  10/22/2019  . TETANUS/TDAP  10/21/2022  . DEXA SCAN  Completed  . PNA vac Low  Risk Adult  Completed       Assessment  This is a routine wellness examination for [employer].  Health Maintenance: Due or Overdue Health Maintenance Due  Topic Date Due  . Hepatitis C Screening  Never done  . COVID-19 Vaccine (1) Declined  . OPHTHALMOLOGY EXAM  02/17/2014  . FOOT EXAM  02/09/2018  . MAMMOGRAM  06/29/2018  . HEMOGLOBIN A1C  11/16/2018    [name] does not need a referral for Community Assistance: Care  Management:   no Social Work:    no Prescription Assistance:  no Nutrition/Diabetes Education:  no   Plan:  Personalized Goals Goals Addressed            This Visit's Progress   . DIET - INCREASE WATER INTAKE       Try to drink 6-8 glasses of water daily      Personalized Health Maintenance & Screening Recommendations  Screening mammography Advanced directives: has an advanced directive - a copy HAS NOT been provided.  Lung Cancer Screening Recommended: no (Low Dose CT Chest recommended if Age 55-80 years, 30 pack-year currently smoking OR have quit w/in past 15 years) Hepatitis C Screening recommended: no HIV Screening recommended: no  Advanced Directives: Written information was not prepared per patient's request.  Referrals & Orders No orders of the defined types were placed in this encounter.   Follow-up Plan . Follow-up with [name], FNP as planned . Schedule your screening mammogram as discussed . If you change your mind about the COVID vaccine you can get it at your local pharmacy as we don't have it in office yet . If you change your mind about the Shingrix vaccine let us know at your next appointment  . Bring a copy of your Advanced Directives in for our records   I have personally reviewed and noted the following in the patient's chart:   . Medical and social history . Use of alcohol, tobacco or illicit drugs  . Current medications and supplements . Functional ability and status . Nutritional status . Physical activity . Advanced directives . List of other physicians . Hospitalizations, surgeries, and ER visits in previous 12 months . Vitals . Screenings to include cognitive, depression, and falls . Referrals and appointments  In addition, I have reviewed and discussed with [name] certain preventive protocols, quality metrics, and best practice recommendations. A written personalized care plan for preventive services as well as  general preventive health recommendations is available and can be mailed to the patient at her request.      Milas Hock, LPN  08/25/7844

## 2019-09-27 NOTE — Patient Instructions (Signed)
Preventive Care 78 Years and Older, Female Preventive care refers to lifestyle choices and visits with your health care provider that can promote health and wellness. This includes:  A yearly physical exam. This is also called an annual well check.  Regular dental and eye exams.  Immunizations.  Screening for certain conditions.  Healthy lifestyle choices, such as diet and exercise. What can I expect for my preventive care visit? Physical exam Your health care provider will check:  Height and weight. These may be used to calculate body mass index (BMI), which is a measurement that tells if you are at a healthy weight.  Heart rate and blood pressure.  Your skin for abnormal spots. Counseling Your health care provider may ask you questions about:  Alcohol, tobacco, and drug use.  Emotional well-being.  Home and relationship well-being.  Sexual activity.  Eating habits.  History of falls.  Memory and ability to understand (cognition).  Work and work Statistician.  Pregnancy and menstrual history. What immunizations do I need?  Influenza (flu) vaccine  This is recommended every year. Tetanus, diphtheria, and pertussis (Tdap) vaccine  You may need a Td booster every 10 years. Varicella (chickenpox) vaccine  You may need this vaccine if you have not already been vaccinated. Zoster (shingles) vaccine  You may need this after age 78. Pneumococcal conjugate (PCV13) vaccine  One dose is recommended after age 78. Pneumococcal polysaccharide (PPSV23) vaccine  One dose is recommended after age 78. Measles, mumps, and rubella (MMR) vaccine  You may need at least one dose of MMR if you were born in 1957 or later. You may also need a second dose. Meningococcal conjugate (MenACWY) vaccine  You may need this if you have certain conditions. Hepatitis A vaccine  You may need this if you have certain conditions or if you travel or work in places where you may be exposed  to hepatitis A. Hepatitis B vaccine  You may need this if you have certain conditions or if you travel or work in places where you may be exposed to hepatitis B. Haemophilus influenzae type b (Hib) vaccine  You may need this if you have certain conditions. You may receive vaccines as individual doses or as more than one vaccine together in one shot (combination vaccines). Talk with your health care provider about the risks and benefits of combination vaccines. What tests do I need? Blood tests  Lipid and cholesterol levels. These may be checked every 5 years, or more frequently depending on your overall health.  Hepatitis C test.  Hepatitis B test. Screening  Lung cancer screening. You may have this screening every year starting at age 78 if you have a 30-pack-year history of smoking and currently smoke or have quit within the past 15 years.  Colorectal cancer screening. All adults should have this screening starting at age 78 and continuing until age 15. Your health care provider may recommend screening at age 78 if you are at increased risk. You will have tests every 1-10 years, depending on your results and the type of screening test.  Diabetes screening. This is done by checking your blood sugar (glucose) after you have not eaten for a while (fasting). You may have this done every 1-3 years.  Mammogram. This may be done every 1-2 years. Talk with your health care provider about how often you should have regular mammograms.  BRCA-related cancer screening. This may be done if you have a family history of breast, ovarian, tubal, or peritoneal cancers.  Other tests  Sexually transmitted disease (STD) testing.  Bone density scan. This is done to screen for osteoporosis. You may have this done starting at age 78. Follow these instructions at home: Eating and drinking  Eat a diet that includes fresh fruits and vegetables, whole grains, lean protein, and low-fat dairy products. Limit  your intake of foods with high amounts of sugar, saturated fats, and salt.  Take vitamin and mineral supplements as recommended by your health care provider.  Do not drink alcohol if your health care provider tells you not to drink.  If you drink alcohol: ? Limit how much you have to 0-1 drink a day. ? Be aware of how much alcohol is in your drink. In the [place] one drink equals one 12 oz bottle of beer (355 mL), one 5 oz glass of wine (148 mL), or one 1 oz glass of hard liquor (44 mL). Lifestyle  Take daily care of your teeth and gums.  Stay active. Exercise for at least 30 minutes on 5 or more days each week.  Do not use any products that contain nicotine or tobacco, such as cigarettes, e-cigarettes, and chewing tobacco. If you need help quitting, ask your health care provider.  If you are sexually active, practice safe sex. Use a condom or other form of protection in order to prevent STIs (sexually transmitted infections).  Talk with your health care provider about taking a low-dose aspirin or statin. What's next?  Go to your health care provider once a year for a well check visit.  Ask your health care provider how often you should have your eyes and teeth checked.  Stay up to date on all vaccines. This information is not intended to replace advice given to you by your health care provider. Make sure you discuss any questions you have with your health care provider. Document Revised: 03/03/2018 Document Reviewed: 03/03/2018 Elsevier Patient Education  2020 [employer].

## 2019-10-24 ENCOUNTER — Ambulatory Visit (INDEPENDENT_AMBULATORY_CARE_PROVIDER_SITE_OTHER): Payer: Medicare Other | Admitting: Nurse Practitioner

## 2019-10-24 ENCOUNTER — Encounter: Payer: Self-pay | Admitting: Nurse Practitioner

## 2019-10-24 ENCOUNTER — Other Ambulatory Visit: Payer: Self-pay

## 2019-10-24 VITALS — BP 156/87 | HR 71 | Temp 97.1°F | Resp 20 | Ht 69.0 in | Wt 169.0 lb

## 2019-10-24 DIAGNOSIS — F3342 Major depressive disorder, recurrent, in full remission: Secondary | ICD-10-CM | POA: Diagnosis not present

## 2019-10-24 DIAGNOSIS — I10 Essential (primary) hypertension: Secondary | ICD-10-CM | POA: Diagnosis not present

## 2019-10-24 DIAGNOSIS — E785 Hyperlipidemia, unspecified: Secondary | ICD-10-CM | POA: Diagnosis not present

## 2019-10-24 DIAGNOSIS — N1832 Chronic kidney disease, stage 3b: Secondary | ICD-10-CM

## 2019-10-24 DIAGNOSIS — K5150 Left sided colitis without complications: Secondary | ICD-10-CM

## 2019-10-24 DIAGNOSIS — M545 Low back pain, unspecified: Secondary | ICD-10-CM

## 2019-10-24 DIAGNOSIS — M797 Fibromyalgia: Secondary | ICD-10-CM

## 2019-10-24 DIAGNOSIS — E8881 Metabolic syndrome: Secondary | ICD-10-CM

## 2019-10-24 DIAGNOSIS — F5101 Primary insomnia: Secondary | ICD-10-CM

## 2019-10-24 MED ORDER — METHOCARBAMOL 500 MG PO TABS: ORAL_TABLET | ORAL | 1 refills | Status: DC

## 2019-10-24 MED ORDER — OXYCODONE HCL 5 MG PO TABS: 5.0000 mg | ORAL_TABLET | Freq: Two times a day (BID) | ORAL | 0 refills | Status: DC

## 2019-10-24 MED ORDER — AMLODIPINE BESYLATE 2.5 MG PO TABS: 2.5000 mg | ORAL_TABLET | Freq: Every day | ORAL | 1 refills | Status: DC

## 2019-10-24 MED ORDER — BUSPIRONE HCL 10 MG PO TABS: 10.0000 mg | ORAL_TABLET | Freq: Three times a day (TID) | ORAL | 2 refills | Status: DC

## 2019-10-24 NOTE — Patient Instructions (Signed)

## 2019-10-24 NOTE — Addendum Note (Signed)
Addended by: Rolena Infante on: 10/24/2019 04:14 PM   Modules accepted: Orders

## 2019-10-24 NOTE — Progress Notes (Signed)
Subjective:    Patient ID: [name], female    DOB: [birth year], 79 y.o.   MRN: [id number]   Chief Complaint: Medical Management of Chronic Issues    HPI:  1. Essential hypertension No c/o chest pain, sob or headache. Does not check blood pressure at home. Says she takes her blood pressure meds in the evenings BP Readings from Last 3 Encounters:  10/24/19 (!) 156/87  08/02/19 (!) 152/88  07/24/19 (!) 148/88     2. Hyperlipidemia with target LDL less than 100 Tries to watch diet but does no dedicated exercise. She is on crestor and fenofibrate. Lab Results  Component Value Date   CHOL 241 (H) 07/24/2019   HDL 36 (L) 07/24/2019   LDLCALC 140 (H) 07/24/2019   TRIG 358 (H) 07/24/2019   CHOLHDL 6.7 (H) 07/24/2019   The 10-year ASCVD risk score Mikey Bussing DC Jr., et al., 2013) is: 59.3%   Values used to calculate the score:     Age: 89 years     Sex: Female     Is Non-Hispanic African American: No     Diabetic: Yes     Tobacco smoker: No     Systolic Blood Pressure: 262 mmHg     Is BP treated: Yes     HDL Cholesterol: 36 mg/dL     Total Cholesterol: 241 mg/dL   3. Metabolic syndrome Dos not check blood sugars daily. She says she does not really watch diet either.  4. Recurrent major depressive disorder, in full remission ([place]) She is doing well on no medication. Depression screen [facility] 2/9 10/24/2019 09/27/2019 07/24/2019  Decreased Interest 0 0 0  Down, Depressed, Hopeless 0 0 0  PHQ - 2 Score 0 0 0  Altered sleeping 0 - -  Tired, decreased energy 0 - -  Change in appetite 0 - -  Feeling bad or failure about yourself  0 - -  Trouble concentrating 0 - -  Moving slowly or fidgety/restless 0 - -  Suicidal thoughts 0 - -  PHQ-9 Score 0 - -  Difficult doing work/chores Not difficult at all - -  Some recent data might be hidden     5. Fibromyalgia Pain assessment: Cause of pain- fibromyalgia Pain location- different locations Pain on scale of 1-10- 4/10 Frequency-  daily What increases pain-stress What makes pain Better-pain meds and rest Effects on ADL - none Any change in general medical condition-none  Current opioids rx- oxycodone 14m BID # meds rx- 60 Effectiveness of current meds-helps Adverse reactions form pain meds-none Morphine equivalent- 15 MEDD  Pill count performed-No Last drug screen - 08/22/18 ( high risk q374mmoderate risk q6m5mow risk yearly ) Urine drug screen today- Yes [place]  If yes were their any concerning findings? - no   Overdose risk: 1  Pain contract signed on: 07/25/19   6. Primary insomnia takes seroquel to sleep at night and olly sleep 3-4 hours  7. ULCERATIVE COLITIS, LEFT SIDED Is on bentyl which helps with her symptoms. Causes her as much pain as fibromyalgia  8. Stage 3b chronic kidney disease Lab Results  Component Value Date   CREATININE 1.19 (H) 07/24/2019   BUN 16 07/24/2019   NA 139 07/24/2019   K 5.1 07/24/2019   CL 100 07/24/2019   CO2 22 07/24/2019       Outpatient Encounter Medications as of 10/24/2019  Medication Sig   antiseptic oral rinse (BIOTENE) LIQD 1 application  by Mouth Rinse route daily. FOR DRY MOUTH   benazepril-hydrochlorthiazide (LOTENSIN HCT) 20-12.5 MG tablet Take 2 tablets by mouth daily. TAKE (2) TABLETS  BY MOUTH ONCE DAILY.   busPIRone (BUSPAR) 10 MG tablet Take 1 tablet (10 mg total) by mouth 3 (three) times daily.   co-enzyme Q-10 50 MG capsule Take 50 mg by mouth once a week.    dicyclomine (BENTYL) 20 MG tablet Take 0.5-1 tablets (10-20 mg total) by mouth daily.   hydrocortisone (ANUSOL-HC) 2.5 % rectal cream Place 1 application rectally 2 (two) times daily.   Loperamide HCl (IMODIUM PO) Take 1 tablet by mouth daily as needed (diarrhea).   methocarbamol (ROBAXIN) 500 MG tablet TAKE 1 TABLET EVERY 8 HOURS AS NEEDED FOR MUSCLE SPAAMS   Multiple Vitamins-Minerals (WOMENS MULTI VITAMIN & MINERAL PO) Take 1 tablet by mouth every other  day.    Omega-3 Fatty Acids (FISH OIL) 1000 MG CPDR Take 3 capsules by mouth daily.    ondansetron (ZOFRAN) 4 MG tablet Take 1 tablet (4 mg total) by mouth every 8 (eight) hours as needed for nausea or vomiting.   [START ON 11/13/2019] oxyCODONE (ROXICODONE) 5 MG immediate release tablet Take 1 tablet (5 mg total) by mouth 2 (two) times daily.   oxyCODONE (ROXICODONE) 5 MG immediate release tablet Take 1 tablet (5 mg total) by mouth 2 (two) times daily.   Probiotic Product (PROBIOTIC FORMULA PO) Take 1-2 capsules by mouth every morning.    QUEtiapine (SEROQUEL) 100 MG tablet Take 1 tablet (100 mg total) by mouth at bedtime.   rosuvastatin (CRESTOR) 10 MG tablet Take 1 tablet (10 mg total) by mouth daily.   shark liver oil-cocoa butter (PREPARATION H) 0.25-3-85.5 % suppository Place 1 suppository rectally as needed for hemorrhoids.   vitamin E 400 UNIT capsule Take 400 Units by mouth every morning.      Past Surgical History:  Procedure Laterality Date   ABDOMINAL HYSTERECTOMY     BIOPSY N/A 12/28/2014   Procedure: SIGMOID AND RECTAL BIOPSIES;  Surgeon: Rogene Houston, MD;  Location: [facility];  Service: Endoscopy;  Laterality: N/A;   CHOLECYSTECTOMY     s/p   COLONOSCOPY  06/16/06. 04/27/2007   proctitis seen, biopsies showed chronic active colitis, no dysplasia    EYE SURGERY     clear lense extraction 1998    FLEXIBLE SIGMOIDOSCOPY N/A 12/28/2014   Procedure: FLEXIBLE SIGMOIDOSCOPY WITH PROPOFOL;  Surgeon: Rogene Houston, MD;  Location: [facility];  Service: Endoscopy;  Laterality: N/A;    Family History  Problem Relation Age of Onset   Heart attack Mother 33       Died in her sleep   Diabetes Mother    Heart attack Father 40       Died in her sleep   Heart attack Brother 41       Defib, CABG   Diabetes Brother    Kidney disease Brother        dyalasis    Cancer Sister        liver, breast    Heart disease Sister     New complaints: None today  Social  history: Lives by herself     Review of Systems  Constitutional: Negative for diaphoresis.  Eyes: Negative for pain.  Respiratory: Negative for shortness of breath.   Cardiovascular: Negative for chest pain, palpitations and leg swelling.  Gastrointestinal: Negative for abdominal pain.  Endocrine: Negative for polydipsia.  Skin: Negative for rash.  Neurological:  Negative for dizziness, weakness and headaches.  Hematological: Does not bruise/bleed easily.  All other systems reviewed and are negative.      Objective:   Physical Exam Vitals and nursing note reviewed.  Constitutional:      General: She is not in acute distress.    Appearance: Normal appearance. She is well-developed.  HENT:     Head: Normocephalic.     Nose: Nose normal.  Eyes:     Pupils: Pupils are equal, round, and reactive to light.  Neck:     Vascular: No carotid bruit or JVD.  Cardiovascular:     Rate and Rhythm: Normal rate and regular rhythm.     Heart sounds: Normal heart sounds.  Pulmonary:     Effort: Pulmonary effort is normal. No respiratory distress.     Breath sounds: Normal breath sounds. No wheezing or rales.  Chest:     Chest wall: No tenderness.  Abdominal:     General: Bowel sounds are normal. There is no distension or abdominal bruit.     Palpations: Abdomen is soft. There is no hepatomegaly, splenomegaly, mass or pulsatile mass.     Tenderness: There is no abdominal tenderness.  Musculoskeletal:        General: Normal range of motion.     Cervical back: Normal range of motion and neck supple.  Lymphadenopathy:     Cervical: No cervical adenopathy.  Skin:    General: Skin is warm and dry.  Neurological:     Mental Status: She is alert and oriented to person, place, and time.     Deep Tendon Reflexes: Reflexes are normal and symmetric.  Psychiatric:        Behavior: Behavior normal.        Thought Content: Thought content normal.        Judgment: Judgment normal.    BP (!)  156/87    Pulse 71    Temp (!) 97.1 F (36.2 C) (Temporal)    Resp 20    Ht 5' 9" (1.753 m)    Wt 169 lb (76.7 kg)    SpO2 96%    BMI 24.96 kg/m         Assessment & Plan:  [name] comes in today with chief complaint of Medical Management of Chronic Issues   Diagnosis and orders addressed:  1. Essential hypertension Low sodium diet - amLODipine (NORVASC) 2.5 MG tablet; Take 1 tablet (2.5 mg total) by mouth daily.  Dispense: 90 tablet; Refill: 1 - CBC with Differential/Platelet - CMP14+EGFR  2. Hyperlipidemia with target LDL less than 100 Low fat diet - Lipid panel  3. Metabolic syndrome Watch carbs in diet  4. Recurrentt major depressive disorder, in full remission ([place]) Stress management  5. Fibromyalgia stya active to keep muscles warm and loose - oxyCODONE (ROXICODONE) 5 MG immediate release tablet; Take 1 tablet (5 mg total) by mouth 2 (two) times daily.  Dispense: 60 tablet; Refill: 0 - oxyCODONE (ROXICODONE) 5 MG immediate release tablet; Take 1 tablet (5 mg total) by mouth 2 (two) times daily.  Dispense: 60 tablet; Refill: 0 - oxyCODONE (ROXICODONE) 5 MG immediate release tablet; Take 1 tablet (5 mg total) by mouth 2 (two) times daily.  Dispense: 60 tablet; Refill: 0  6. Primary insomnia Bedtime routine  7. ULCERATIVE COLITIS, LEFT SIDED Continue bentyl as needed - busPIRone (BUSPAR) 10 MG tablet; Take 1 tablet (10 mg total) by mouth 3 (three) times daily.  Dispense: 60 tablet; Refill:  2  8. Stage 3b chronic kidney disease ;labs pending  9. Acute bilateral low back pain without sciatica  methocarbamol (ROBAXIN) 500 MG tablet; TAKE 1 TABLET EVERY 8 HOURS AS NEEDED FOR MUSCLE SPAAMS  Dispense: 30 tablet; Refill: 1   Labs pending Health Maintenance reviewed- patient will schedule mammogram Diet and exercise encouraged  Follow up plan: 4 months  Mary-Margaret Hassell Done, FNP

## 2019-10-25 LAB — CBC WITH DIFFERENTIAL/PLATELET
Basophils Absolute: 0.1 10*3/uL (ref 0.0–0.2)
Basos: 1 %
EOS (ABSOLUTE): 0.1 10*3/uL (ref 0.0–0.4)
Eos: 1 %
Hematocrit: 41.9 % (ref 34.0–46.6)
Hemoglobin: 14 g/dL (ref 11.1–15.9)
Immature Grans (Abs): 0 10*3/uL (ref 0.0–0.1)
Immature Granulocytes: 0 %
Lymphocytes Absolute: 2.4 10*3/uL (ref 0.7–3.1)
Lymphs: 29 %
MCH: 30.4 pg (ref 26.6–33.0)
MCHC: 33.4 g/dL (ref 31.5–35.7)
MCV: 91 fL (ref 79–97)
Monocytes Absolute: 0.6 10*3/uL (ref 0.1–0.9)
Monocytes: 7 %
Neutrophils Absolute: 4.9 10*3/uL (ref 1.4–7.0)
Neutrophils: 62 %
Platelets: 320 10*3/uL (ref 150–450)
RBC: 4.6 x10E6/uL (ref 3.77–5.28)
RDW: 13.2 % (ref 11.7–15.4)
WBC: 8 10*3/uL (ref 3.4–10.8)

## 2019-10-25 LAB — LIPID PANEL
Chol/HDL Ratio: 3.9 ratio (ref 0.0–4.4)
Cholesterol, Total: 161 mg/dL (ref 100–199)
HDL: 41 mg/dL (ref >39)
LDL Chol Calc (NIH): 78 mg/dL (ref 0–99)
Triglycerides: 254 mg/dL — ABNORMAL HIGH (ref 0–149)
VLDL Cholesterol Cal: 42 mg/dL — ABNORMAL HIGH (ref 5–40)

## 2019-10-25 LAB — CMP14+EGFR
ALT: 25 IU/L (ref 0–32)
AST: 26 IU/L (ref 0–40)
Albumin/Globulin Ratio: 1.6 (ref 1.2–2.2)
Albumin: 4.6 g/dL (ref 3.7–4.7)
Alkaline Phosphatase: 77 IU/L (ref 48–121)
BUN/Creatinine Ratio: 12 (ref 12–28)
BUN: 18 mg/dL (ref 8–27)
Bilirubin Total: 0.7 mg/dL (ref 0.0–1.2)
CO2: 26 mmol/L (ref 20–29)
Calcium: 9.6 mg/dL (ref 8.7–10.3)
Chloride: 95 mmol/L — ABNORMAL LOW (ref 96–106)
Creatinine, Ser: 1.46 mg/dL — ABNORMAL HIGH (ref 0.57–1.00)
GFR calc Af Amer: 39 mL/min/{1.73_m2} — ABNORMAL LOW (ref >59)
GFR calc non Af Amer: 34 mL/min/{1.73_m2} — ABNORMAL LOW (ref >59)
Globulin, Total: 2.8 g/dL (ref 1.5–4.5)
Glucose: 131 mg/dL — ABNORMAL HIGH (ref 65–99)
Potassium: 4.5 mmol/L (ref 3.5–5.2)
Sodium: 134 mmol/L (ref 134–144)
Total Protein: 7.4 g/dL (ref 6.0–8.5)

## 2019-10-26 LAB — TOXASSURE SELECT 13 (MW), URINE

## 2019-10-26 LAB — URINE CULTURE

## 2019-11-22 ENCOUNTER — Other Ambulatory Visit: Payer: Self-pay

## 2019-11-22 ENCOUNTER — Ambulatory Visit: Payer: Medicare Other

## 2019-11-22 DIAGNOSIS — Z23 Encounter for immunization: Secondary | ICD-10-CM

## 2019-11-22 NOTE — Progress Notes (Signed)
   Covid-19 Vaccination Clinic  Name:  [name]    MRN: [id number] DOB: 04/19/1940  11/22/2019  [name] was observed post Covid-19 immunization for 30 minutes based on pre-vaccination screening without incident. She was provided with Vaccine Information Sheet and instruction to access the V-Safe system.   [name] was instructed to call 911 with any severe reactions post vaccine: Marland Kitchen Difficulty breathing  . Swelling of face and throat  . A fast heartbeat  . A bad rash all over body  . Dizziness and weakness   Immunizations Administered    Name Date Dose VIS Date Route   Moderna COVID-19 Vaccine 11/22/2019 11:51 AM 0.5 mL 02/2019 Intramuscular   Manufacturer: Moderna   Lot: [id number]   [place]: 10254-862-82

## 2019-12-15 ENCOUNTER — Other Ambulatory Visit: Payer: Self-pay | Admitting: Nurse Practitioner

## 2019-12-20 ENCOUNTER — Ambulatory Visit: Payer: Medicare Other

## 2020-01-10 ENCOUNTER — Telehealth: Payer: Self-pay

## 2020-01-10 ENCOUNTER — Ambulatory Visit: Payer: Medicare Other

## 2020-01-15 ENCOUNTER — Other Ambulatory Visit: Payer: Self-pay | Admitting: Nurse Practitioner

## 2020-01-17 ENCOUNTER — Ambulatory Visit: Payer: Medicare Other

## 2020-01-23 ENCOUNTER — Other Ambulatory Visit: Payer: Self-pay | Admitting: Nurse Practitioner

## 2020-01-23 DIAGNOSIS — M545 Low back pain, unspecified: Secondary | ICD-10-CM

## 2020-01-24 ENCOUNTER — Other Ambulatory Visit: Payer: Self-pay | Admitting: Nurse Practitioner

## 2020-01-24 DIAGNOSIS — M545 Low back pain, unspecified: Secondary | ICD-10-CM

## 2020-02-26 ENCOUNTER — Other Ambulatory Visit: Payer: Self-pay

## 2020-02-26 ENCOUNTER — Ambulatory Visit (INDEPENDENT_AMBULATORY_CARE_PROVIDER_SITE_OTHER): Payer: Medicare Other | Admitting: Nurse Practitioner

## 2020-02-26 ENCOUNTER — Encounter: Payer: Self-pay | Admitting: Nurse Practitioner

## 2020-02-26 VITALS — BP 138/91 | HR 89 | Temp 97.3°F | Resp 20 | Ht 69.0 in | Wt 170.0 lb

## 2020-02-26 DIAGNOSIS — F5101 Primary insomnia: Secondary | ICD-10-CM

## 2020-02-26 DIAGNOSIS — N1832 Chronic kidney disease, stage 3b: Secondary | ICD-10-CM

## 2020-02-26 DIAGNOSIS — I10 Essential (primary) hypertension: Secondary | ICD-10-CM

## 2020-02-26 DIAGNOSIS — E785 Hyperlipidemia, unspecified: Secondary | ICD-10-CM

## 2020-02-26 DIAGNOSIS — F3342 Major depressive disorder, recurrent, in full remission: Secondary | ICD-10-CM

## 2020-02-26 DIAGNOSIS — M797 Fibromyalgia: Secondary | ICD-10-CM

## 2020-02-26 DIAGNOSIS — E8881 Metabolic syndrome: Secondary | ICD-10-CM | POA: Diagnosis not present

## 2020-02-26 DIAGNOSIS — K5150 Left sided colitis without complications: Secondary | ICD-10-CM

## 2020-02-26 LAB — BAYER DCA HB A1C WAIVED: HB A1C (BAYER DCA - WAIVED): 8.1 % — ABNORMAL HIGH (ref <7.0)

## 2020-02-26 MED ORDER — AMLODIPINE BESYLATE 2.5 MG PO TABS: 2.5000 mg | ORAL_TABLET | Freq: Every day | ORAL | 1 refills | Status: DC

## 2020-02-26 MED ORDER — OXYCODONE HCL 5 MG PO TABS: 5.0000 mg | ORAL_TABLET | Freq: Two times a day (BID) | ORAL | 0 refills | Status: DC

## 2020-02-26 MED ORDER — BENAZEPRIL-HYDROCHLOROTHIAZIDE 20-12.5 MG PO TABS: 2.0000 | ORAL_TABLET | Freq: Every day | ORAL | 3 refills | Status: DC

## 2020-02-26 MED ORDER — ESCITALOPRAM OXALATE 20 MG PO TABS: 20.0000 mg | ORAL_TABLET | Freq: Every day | ORAL | 1 refills | Status: DC

## 2020-02-26 MED ORDER — QUETIAPINE FUMARATE 100 MG PO TABS: 100.0000 mg | ORAL_TABLET | Freq: Every day | ORAL | 1 refills | Status: DC

## 2020-02-26 MED ORDER — BUSPIRONE HCL 10 MG PO TABS: 10.0000 mg | ORAL_TABLET | Freq: Three times a day (TID) | ORAL | 2 refills | Status: DC

## 2020-02-26 NOTE — Progress Notes (Signed)
Subjective:    Patient ID: [name], female    DOB: 10/11/1940, 79 y.o.   MRN: [id number]   Chief Complaint: Medical Management of Chronic Issues    HPI:  1. Essential hypertension No c/o chest pain, sob or headache. Does not check blood pressure at home. BP Readings from Last 3 Encounters:  02/26/20 (!) 138/91  10/24/19 (!) 156/87  08/02/19 (!) 152/88     2. Hyperlipidemia with target LDL less than 100 Does watch diet some but does no exercise. Is on crestor daily Lab Results  Component Value Date   CHOL 161 10/24/2019   HDL 41 10/24/2019   LDLCALC 78 10/24/2019   TRIG 254 (H) 10/24/2019   CHOLHDL 3.9 10/24/2019     3. Metabolic syndrome Does not check blood sugars at home. Lab Results  Component Value Date   HGBA1C 6.3 05/18/2018     4. Stage 3b chronic kidney disease ([place]) No trouble passing her urine. Lab Results  Component Value Date   CREATININE 1.46 (H) 10/24/2019     5. ULCERATIVE COLITIS, LEFT SIDED Doing well. COvid vaccines kinda caused a flare up but she is doing well.  6. Recurrent major depressive disorder, in full remission ([place]) Is on lexapro, and she is not doing well right now. Her brother is really sick and they are not sure what they are going todo for him.  7. Fibromyalgia Has muscles aches daily Pain assessment: Cause of pain- fibromyalia and ulcerative colitis Pain location- bil shoulders hurt today- no abdominal pain today Pain on scale of 1-10- 5/10 today Frequency- daily What increases pain-to much activity What makes pain Better-rest Effects on ADL - none Any change in general medical condition-none  Current opioids rx- oxycodone 10m daily # meds rx- 60 Effectiveness of current meds-helps Adverse reactions from pain meds-none Morphine equivalent-  15MME  Pill count performed-No Last drug screen - 10/24/19 ( high risk q362mmoderate risk q6m88mow risk yearly ) Urine drug screen today- No [place]  [place]  If yes were their any concerning findings? - no   Overdose risk: 1  Pain contract signed on:07/24/19   8. Primary insomnia Is on seroquel nightly and helps her to sleep.    Outpatient Encounter Medications as of 02/26/2020  Medication Sig  . amLODipine (NORVASC) 2.5 MG tablet Take 1 tablet (2.5 mg total) by mouth daily.  . aMarland Kitchentiseptic oral rinse (BIOTENE) LIQD 1 application by Mouth Rinse route daily. FOR DRY MOUTH  . benazepril-hydrochlorthiazide (LOTENSIN HCT) 20-12.5 MG tablet Take 2 tablets by mouth daily. TAKE (2) TABLETS  BY MOUTH ONCE DAILY.  . cMarland Kitchen-enzyme Q-10 50 MG capsule Take 50 mg by mouth once a week.   . dicyclomine (BENTYL) 20 MG tablet Take 0.5-1 tablets (10-20 mg total) by mouth daily.  . eMarland Kitchencitalopram (LEXAPRO) 20 MG tablet TAKE 1 TABLET ONCE DAILY  . hydrocortisone (ANUSOL-HC) 2.5 % rectal cream Place 1 application rectally 2 (two) times daily.  . Loperamide HCl (IMODIUM PO) Take 1 tablet by mouth daily as needed (diarrhea).  . methocarbamol (ROBAXIN) 500 MG tablet TAKE 1 TABLET EVERY 8 HOURS AS NEEDED FOR MUSCLE SPAAMS  . Multiple Vitamins-Minerals (WOMENS MULTI VITAMIN & MINERAL PO) Take 1 tablet by mouth every other day.   . Omega-3 Fatty Acids (FISH OIL) 1000 MG CPDR Take 3 capsules by mouth daily.   . ondansetron (ZOFRAN) 4 MG tablet Take 1 tablet (4 mg total) by mouth every 8 (eight) hours as  needed for nausea or vomiting.  Marland Kitchen oxyCODONE (ROXICODONE) 5 MG immediate release tablet Take 1 tablet (5 mg total) by mouth 2 (two) times daily.  . Probiotic Product (PROBIOTIC FORMULA PO) Take 1-2 capsules by mouth every morning.   Marland Kitchen QUEtiapine (SEROQUEL) 100 MG tablet Take 1 tablet (100 mg total) by mouth at bedtime.  . rosuvastatin (CRESTOR) 10 MG tablet Take 1 tablet (10 mg total) by mouth daily.  . shark liver oil-cocoa butter (PREPARATION H) 0.25-3-85.5 % suppository Place 1 suppository rectally as needed for hemorrhoids.  . vitamin E 400 UNIT capsule Take 400 Units  by mouth every morning.   . busPIRone (BUSPAR) 10 MG tablet Take 1 tablet (10 mg total) by mouth 3 (three) times daily. (Patient not taking: Reported on 02/26/2020)  . oxyCODONE (ROXICODONE) 5 MG immediate release tablet Take 1 tablet (5 mg total) by mouth 2 (two) times daily.  Marland Kitchen oxyCODONE (ROXICODONE) 5 MG immediate release tablet Take 1 tablet (5 mg total) by mouth 2 (two) times daily.     Past Surgical History:  Procedure Laterality Date  . ABDOMINAL HYSTERECTOMY    . BIOPSY N/A 12/28/2014   Procedure: SIGMOID AND RECTAL BIOPSIES;  Surgeon: Rogene Houston, MD;  Location: [facility];  Service: Endoscopy;  Laterality: N/A;  . CHOLECYSTECTOMY     s/p  . COLONOSCOPY  06/16/06. 04/27/2007   proctitis seen, biopsies showed chronic active colitis, no dysplasia   . EYE SURGERY     clear lense extraction 1998   . FLEXIBLE SIGMOIDOSCOPY N/A 12/28/2014   Procedure: FLEXIBLE SIGMOIDOSCOPY WITH PROPOFOL;  Surgeon: Rogene Houston, MD;  Location: [facility];  Service: Endoscopy;  Laterality: N/A;    Family History  Problem Relation Age of Onset  . Heart attack Mother 9       Died in her sleep  . Diabetes Mother   . Heart attack Father 24       Died in her sleep  . Heart attack Brother 45       Defib, CABG  . Diabetes Brother   . Kidney disease Brother        dyalasis   . Cancer Sister        liver, breast   . Heart disease Sister     New complaints: None today  Social history: Lives by herself- her brother is really sick and she is worried about him.     Review of Systems  Constitutional: Negative for diaphoresis.  Eyes: Negative for pain.  Respiratory: Negative for shortness of breath.   Cardiovascular: Negative for chest pain, palpitations and leg swelling.  Gastrointestinal: Negative for abdominal pain.  Endocrine: Negative for polydipsia.  Skin: Negative for rash.  Neurological: Negative for dizziness, weakness and headaches.  Hematological: Does not bruise/bleed easily.    All other systems reviewed and are negative.      Objective:   Physical Exam Vitals and nursing note reviewed.  Constitutional:      General: She is not in acute distress.    Appearance: Normal appearance. She is well-developed.  HENT:     Head: Normocephalic.     Nose: Nose normal.  Eyes:     Pupils: Pupils are equal, round, and reactive to light.  Neck:     Vascular: No carotid bruit or JVD.  Cardiovascular:     Rate and Rhythm: Normal rate and regular rhythm.     Heart sounds: Normal heart sounds.  Pulmonary:     Effort: Pulmonary  effort is normal. No respiratory distress.     Breath sounds: Normal breath sounds. No wheezing or rales.  Chest:     Chest wall: No tenderness.  Abdominal:     General: Bowel sounds are normal. There is no distension or abdominal bruit.     Palpations: Abdomen is soft. There is no hepatomegaly, splenomegaly, mass or pulsatile mass.     Tenderness: There is no abdominal tenderness.  Musculoskeletal:        General: Normal range of motion.     Cervical back: Normal range of motion and neck supple.  Lymphadenopathy:     Cervical: No cervical adenopathy.  Skin:    General: Skin is warm and dry.  Neurological:     Mental Status: She is alert and oriented to person, place, and time.     Deep Tendon Reflexes: Reflexes are normal and symmetric.  Psychiatric:        Behavior: Behavior normal.        Thought Content: Thought content normal.        Judgment: Judgment normal.    BP (!) 138/91   Pulse 89   Temp (!) 97.3 F (36.3 C) (Temporal)   Resp 20   Ht 5' 9"  (1.753 m)   Wt 170 lb (77.1 kg)   SpO2 95%   BMI 25.10 kg/m         Assessment & Plan:  [name] comes in today with chief complaint of Medical Management of Chronic Issues   Diagnosis and orders addressed:  1. Essential hypertension Low sodium diet - amLODipine (NORVASC) 2.5 MG tablet; Take 1 tablet (2.5 mg total) by mouth daily.  Dispense: 90 tablet; Refill: 1 -  benazepril-hydrochlorthiazide (LOTENSIN HCT) 20-12.5 MG tablet; Take 2 tablets by mouth daily. TAKE (2) TABLETS  BY MOUTH ONCE DAILY.  Dispense: 180 tablet; Refill: 3  2. Hyperlipidemia with target LDL less than 100 Low fat diet  3. Metabolic syndrome Watch carbs in diet Labs pending  4. Stage 3b chronic kidney disease ([place]) Labs pending  5. ULCERATIVE COLITIS, LEFT SIDED Watch diet to prevent flare up - busPIRone (BUSPAR) 10 MG tablet; Take 1 tablet (10 mg total) by mouth 3 (three) times daily.  Dispense: 60 tablet; Refill: 2  6. Recurrent major depressive disorder, in full remission [facility]) Stress management  7. Fibromyalgia - oxyCODONE (ROXICODONE) 5 MG immediate release tablet; Take 1 tablet (5 mg total) by mouth 2 (two) times daily.  Dispense: 60 tablet; Refill: 0 - oxyCODONE (ROXICODONE) 5 MG immediate release tablet; Take 1 tablet (5 mg total) by mouth 2 (two) times daily.  Dispense: 60 tablet; Refill: 0 - oxyCODONE (ROXICODONE) 5 MG immediate release tablet; Take 1 tablet (5 mg total) by mouth 2 (two) times daily.  Dispense: 60 tablet; Refill: 0  8. Primary insomnia Bedtime routine - QUEtiapine (SEROQUEL) 100 MG tablet; Take 1 tablet (100 mg total) by mouth at bedtime.  Dispense: 90 tablet; Refill: 1   Labs pending Health Maintenance reviewed Diet and exercise encouraged  Follow up plan: 3 months   Mary-Margaret Hassell Done, FNP

## 2020-02-26 NOTE — Addendum Note (Signed)
Addended by: Rolena Infante on: 02/26/2020 02:51 PM   Modules accepted: Orders

## 2020-02-26 NOTE — Patient Instructions (Signed)
Colitis  Colitis is inflammation of the colon. Colitis may last a short time (be acute), or it may last a long time (become chronic). What are the causes? This condition may be caused by:  Viruses.  Bacteria.  Reaction to medicine.  Certain autoimmune diseases such as Crohn's disease or ulcerative colitis.  Radiation treatment.  Decreased blood flow to the bowel (ischemia). What are the signs or symptoms? Symptoms of this condition include:  Watery diarrhea.  Passing bloody or tarry stool.  Pain.  Fever.  Vomiting.  Tiredness (fatigue).  Weight loss.  Bloating.  Abdominal pain.  Having fewer bowel movements than usual.  A strong and sudden urge to have a bowel movement.  Feeling like the bowel is not empty after a bowel movement. How is this diagnosed? This condition is diagnosed with a stool test or a blood test. You may also have other tests, such as:  X-rays.  CT scan.  Colonoscopy.  Endoscopy.  Biopsy. How is this treated? Treatment for this condition depends on the cause. The condition may be treated by:  Resting the bowel. This involves not eating or drinking for a period of time.  Fluids that are given through an IV.  Medicine for pain and diarrhea.  Antibiotic medicines.  Cortisone medicines.  Surgery. Follow these instructions at home: Eating and drinking   Follow instructions from your health care provider about eating or drinking restrictions.  Drink enough fluid to keep your urine pale yellow.  Work with a dietitian to determine which foods cause your condition to flare up.  Avoid foods that cause flare-ups.  Eat a well-balanced diet. General instructions  If you were prescribed an antibiotic medicine, take it as told by your health care provider. Do not stop taking the antibiotic even if you start to feel better.  Take over-the-counter and prescription medicines only as told by your health care provider.  Keep all  follow-up visits as told by your health care provider. This is important. Contact a health care provider if:  Your symptoms do not go away.  You develop new symptoms. Get help right away if you:  Have a fever that does not go away with treatment.  Develop chills.  Have extreme weakness, fainting, or dehydration.  Have repeated vomiting.  Develop severe pain in your abdomen.  Pass bloody or tarry stool. Summary  Colitis is inflammation of the colon. Colitis may last a short time (be acute), or it may last a long time (become chronic).  Treatment for this condition depends on the cause and may include resting the bowel, taking medicines, or having surgery.  If you were prescribed an antibiotic medicine, take it as told by your health care provider. Do not stop taking the antibiotic even if you start to feel better.  Get help right away if you develop severe pain in your abdomen.  Keep all follow-up visits as told by your health care provider. This is important. This information is not intended to replace advice given to you by your health care provider. Make sure you discuss any questions you have with your health care provider. Document Revised: 09/09/2017 Document Reviewed: 09/09/2017 Elsevier Patient Education  2020 [employer].

## 2020-02-27 LAB — CBC WITH DIFFERENTIAL/PLATELET
Basophils Absolute: 0 10*3/uL (ref 0.0–0.2)
Basos: 1 %
EOS (ABSOLUTE): 0.1 10*3/uL (ref 0.0–0.4)
Eos: 2 %
Hematocrit: 42.3 % (ref 34.0–46.6)
Hemoglobin: 14.2 g/dL (ref 11.1–15.9)
Immature Grans (Abs): 0 10*3/uL (ref 0.0–0.1)
Immature Granulocytes: 0 %
Lymphocytes Absolute: 1.6 10*3/uL (ref 0.7–3.1)
Lymphs: 30 %
MCH: 30.1 pg (ref 26.6–33.0)
MCHC: 33.6 g/dL (ref 31.5–35.7)
MCV: 90 fL (ref 79–97)
Monocytes Absolute: 0.3 10*3/uL (ref 0.1–0.9)
Monocytes: 6 %
Neutrophils Absolute: 3.4 10*3/uL (ref 1.4–7.0)
Neutrophils: 61 %
Platelets: 277 10*3/uL (ref 150–450)
RBC: 4.71 x10E6/uL (ref 3.77–5.28)
RDW: 12.7 % (ref 11.7–15.4)
WBC: 5.5 10*3/uL (ref 3.4–10.8)

## 2020-02-27 LAB — CMP14+EGFR
ALT: 23 IU/L (ref 0–32)
AST: 23 IU/L (ref 0–40)
Albumin/Globulin Ratio: 1.6 (ref 1.2–2.2)
Albumin: 4.4 g/dL (ref 3.7–4.7)
Alkaline Phosphatase: 75 IU/L (ref 44–121)
BUN/Creatinine Ratio: 16 (ref 12–28)
BUN: 21 mg/dL (ref 8–27)
Bilirubin Total: 0.8 mg/dL (ref 0.0–1.2)
CO2: 25 mmol/L (ref 20–29)
Calcium: 9.7 mg/dL (ref 8.7–10.3)
Chloride: 99 mmol/L (ref 96–106)
Creatinine, Ser: 1.34 mg/dL — ABNORMAL HIGH (ref 0.57–1.00)
GFR calc Af Amer: 44 mL/min/{1.73_m2} — ABNORMAL LOW (ref >59)
GFR calc non Af Amer: 38 mL/min/{1.73_m2} — ABNORMAL LOW (ref >59)
Globulin, Total: 2.8 g/dL (ref 1.5–4.5)
Glucose: 236 mg/dL — ABNORMAL HIGH (ref 65–99)
Potassium: 4.1 mmol/L (ref 3.5–5.2)
Sodium: 132 mmol/L — ABNORMAL LOW (ref 134–144)
Total Protein: 7.2 g/dL (ref 6.0–8.5)

## 2020-02-27 LAB — LIPID PANEL
Chol/HDL Ratio: 5.2 ratio — ABNORMAL HIGH (ref 0.0–4.4)
Cholesterol, Total: 212 mg/dL — ABNORMAL HIGH (ref 100–199)
HDL: 41 mg/dL (ref >39)
LDL Chol Calc (NIH): 103 mg/dL — ABNORMAL HIGH (ref 0–99)
Triglycerides: 404 mg/dL — ABNORMAL HIGH (ref 0–149)
VLDL Cholesterol Cal: 68 mg/dL — ABNORMAL HIGH (ref 5–40)

## 2020-02-28 ENCOUNTER — Ambulatory Visit: Payer: Medicare Other

## 2020-04-03 DIAGNOSIS — R922 Inconclusive mammogram: Secondary | ICD-10-CM | POA: Diagnosis not present

## 2020-04-03 DIAGNOSIS — R928 Other abnormal and inconclusive findings on diagnostic imaging of breast: Secondary | ICD-10-CM | POA: Diagnosis not present

## 2020-04-10 ENCOUNTER — Other Ambulatory Visit: Payer: Self-pay | Admitting: Nurse Practitioner

## 2020-05-27 ENCOUNTER — Ambulatory Visit (INDEPENDENT_AMBULATORY_CARE_PROVIDER_SITE_OTHER): Payer: Medicare Other | Admitting: Nurse Practitioner

## 2020-05-27 ENCOUNTER — Encounter: Payer: Self-pay | Admitting: Nurse Practitioner

## 2020-05-27 ENCOUNTER — Other Ambulatory Visit: Payer: Self-pay

## 2020-05-27 VITALS — BP 136/84 | HR 83 | Temp 98.4°F | Resp 20 | Ht 69.0 in | Wt 169.0 lb

## 2020-05-27 DIAGNOSIS — I10 Essential (primary) hypertension: Secondary | ICD-10-CM | POA: Diagnosis not present

## 2020-05-27 DIAGNOSIS — E1122 Type 2 diabetes mellitus with diabetic chronic kidney disease: Secondary | ICD-10-CM | POA: Insufficient documentation

## 2020-05-27 DIAGNOSIS — R001 Bradycardia, unspecified: Secondary | ICD-10-CM

## 2020-05-27 DIAGNOSIS — M797 Fibromyalgia: Secondary | ICD-10-CM

## 2020-05-27 DIAGNOSIS — K5150 Left sided colitis without complications: Secondary | ICD-10-CM

## 2020-05-27 DIAGNOSIS — F3342 Major depressive disorder, recurrent, in full remission: Secondary | ICD-10-CM

## 2020-05-27 DIAGNOSIS — F5101 Primary insomnia: Secondary | ICD-10-CM

## 2020-05-27 DIAGNOSIS — N1832 Chronic kidney disease, stage 3b: Secondary | ICD-10-CM

## 2020-05-27 DIAGNOSIS — E785 Hyperlipidemia, unspecified: Secondary | ICD-10-CM

## 2020-05-27 DIAGNOSIS — E119 Type 2 diabetes mellitus without complications: Secondary | ICD-10-CM | POA: Insufficient documentation

## 2020-05-27 MED ORDER — BLOOD GLUCOSE METER KIT: PACK | 0 refills | Status: DC

## 2020-05-27 MED ORDER — DAPAGLIFLOZIN PROPANEDIOL 5 MG PO TABS: 5.0000 mg | ORAL_TABLET | Freq: Every day | ORAL | 2 refills | Status: DC

## 2020-05-27 MED ORDER — ROSUVASTATIN CALCIUM 10 MG PO TABS: 10.0000 mg | ORAL_TABLET | Freq: Every day | ORAL | 0 refills | Status: DC

## 2020-05-27 MED ORDER — HYDROCORTISONE (PERIANAL) 2.5 % EX CREA
1.0000 | TOPICAL_CREAM | Freq: Two times a day (BID) | CUTANEOUS | 0 refills | Status: DC
Start: 2020-05-27 — End: 2020-07-19

## 2020-05-27 MED ORDER — ONDANSETRON HCL 4 MG PO TABS: 4.0000 mg | ORAL_TABLET | Freq: Three times a day (TID) | ORAL | 2 refills | Status: DC | PRN

## 2020-05-27 MED ORDER — OXYCODONE HCL 5 MG PO TABS: 5.0000 mg | ORAL_TABLET | Freq: Two times a day (BID) | ORAL | 0 refills | Status: DC

## 2020-05-27 MED ORDER — METHOCARBAMOL 500 MG PO TABS: 500.0000 mg | ORAL_TABLET | Freq: Four times a day (QID) | ORAL | 1 refills | Status: DC | PRN

## 2020-05-27 NOTE — Progress Notes (Signed)
Subjective:    Patient ID: [name], female    DOB: 1940-04-26, 80 y.o.   MRN: [id number]   Chief Complaint: Medical Management of Chronic Issues    HPI:  1. Essential hypertension No c/o chest pain, sob or headache. Does not check blood pressure at home. BP Readings from Last 3 Encounters:  02/26/20 (!) 138/91  10/24/19 (!) 156/87  08/02/19 (!) 152/88     2. ULCERATIVE COLITIS, LEFT SIDED If she watches what she eats she will have very few flare ups.  3. Stage 3b chronic kidney disease ([place]) No c/o of trouble voiding Lab Results  Component Value Date   CREATININE 1.34 (H) 02/26/2020     4. Bradycardia Heart rate is always somewhat low. She denies any near syncopal or syncopal episodes.  5. Hyperlipidemia with target LDL less than 100 Does not watch diet and does very little exercise Lab Results  Component Value Date   CHOL 212 (H) 02/26/2020   HDL 41 02/26/2020   LDLCALC 103 (H) 02/26/2020   TRIG 404 (H) 02/26/2020   CHOLHDL 5.2 (H) 02/26/2020   The 10-year ASCVD risk score Mikey Bussing DC Jr., et al., 2013) is: 57.7%   6. Diabetes  Does not watch blood sugars and does no exercise. Lab Results  Component Value Date   HGBA1C 8.1 (H) 02/26/2020      7. Recurrent major depressive disorder, in full remission ([place]) Is on lexapro daily which she is doing well on. Denies any side effects.  Depression screen [facility] 2/9 05/27/2020 02/26/2020 10/24/2019 09/27/2019 07/24/2019  Decreased Interest 1 1 0 0 0  Down, Depressed, Hopeless 1 0 0 0 0  PHQ - 2 Score 2 1 0 0 0  Altered sleeping 1 1 0 - -  Tired, decreased energy 1 1 0 - -  Change in appetite 1 1 0 - -  Feeling bad or failure about yourself  0 0 0 - -  Trouble concentrating 0 0 0 - -  Moving slowly or fidgety/restless 0 0 0 - -  Suicidal thoughts 0 0 0 - -  PHQ-9 Score 5 4 0 - -  Difficult doing work/chores Not difficult at all Not difficult at all Not difficult at all - -  Some recent data might be hidden      8. Fibromyalgia Has chronic pain. Pain assessment: Cause of pain- fibromyalgia and her Ulcerative colitis Pain location- varies from day to day Pain on scale of 1-10- 5/10 today Frequency- daily What increases pain-to much activity or poor appetite What makes pain Better-rest, heat  Effects on ADL - none Any change in general medical condition-none  Current opioids rx- oxycodone 45m BID # meds rx- 60 Effectiveness of current meds-helps Adverse reactions from pain meds-none Morphine equivalent- 15MME  Pill count performed-No Last drug screen - 10/24/19 ( high risk q374mmoderate risk q6m37mow risk yearly ) Urine drug screen today- No Was the [place]- yes  If yes were their any concerning findings? - none   Overdose risk: 1  Pain contract signed on: 07/25/19   9. Primary insomnia Does not sleep well at night, but because of pain meds we are not able to give her anything else.     Outpatient Encounter Medications as of 05/27/2020  Medication Sig  . amLODipine (NORVASC) 2.5 MG tablet Take 1 tablet (2.5 mg total) by mouth daily.  . aMarland Kitchentiseptic oral rinse (BIOTENE) LIQD 1 application by Mouth Rinse route daily. FOR DRY MOUTH  .  benazepril-hydrochlorthiazide (LOTENSIN HCT) 20-12.5 MG tablet Take 2 tablets by mouth daily. TAKE (2) TABLETS  BY MOUTH ONCE DAILY.  . busPIRone (BUSPAR) 10 MG tablet Take 1 tablet (10 mg total) by mouth 3 (three) times daily.  Marland Kitchen co-enzyme Q-10 50 MG capsule Take 50 mg by mouth once a week.   . dicyclomine (BENTYL) 20 MG tablet Take 0.5-1 tablets (10-20 mg total) by mouth daily.  Marland Kitchen escitalopram (LEXAPRO) 20 MG tablet Take 1 tablet (20 mg total) by mouth daily.  . hydrocortisone (ANUSOL-HC) 2.5 % rectal cream Place 1 application rectally 2 (two) times daily.  . Loperamide HCl (IMODIUM PO) Take 1 tablet by mouth daily as needed (diarrhea).  . methocarbamol (ROBAXIN) 500 MG tablet TAKE 1 TABLET EVERY 8 HOURS AS NEEDED FOR MUSCLE SPAAMS  .  Multiple Vitamins-Minerals (WOMENS MULTI VITAMIN & MINERAL PO) Take 1 tablet by mouth every other day.   . Omega-3 Fatty Acids (FISH OIL) 1000 MG CPDR Take 3 capsules by mouth daily.   . ondansetron (ZOFRAN) 4 MG tablet Take 1 tablet (4 mg total) by mouth every 8 (eight) hours as needed for nausea or vomiting.  Marland Kitchen oxyCODONE (ROXICODONE) 5 MG immediate release tablet Take 1 tablet (5 mg total) by mouth 2 (two) times daily.  Marland Kitchen oxyCODONE (ROXICODONE) 5 MG immediate release tablet Take 1 tablet (5 mg total) by mouth 2 (two) times daily.  Marland Kitchen oxyCODONE (ROXICODONE) 5 MG immediate release tablet Take 1 tablet (5 mg total) by mouth 2 (two) times daily.  . Probiotic Product (PROBIOTIC FORMULA PO) Take 1-2 capsules by mouth every morning.   Marland Kitchen QUEtiapine (SEROQUEL) 100 MG tablet Take 1 tablet (100 mg total) by mouth at bedtime.  . rosuvastatin (CRESTOR) 10 MG tablet Take 1 tablet (10 mg total) by mouth daily.  . shark liver oil-cocoa butter (PREPARATION H) 0.25-3-85.5 % suppository Place 1 suppository rectally as needed for hemorrhoids.  . vitamin E 400 UNIT capsule Take 400 Units by mouth every morning.      Past Surgical History:  Procedure Laterality Date  . ABDOMINAL HYSTERECTOMY    . BIOPSY N/A 12/28/2014   Procedure: SIGMOID AND RECTAL BIOPSIES;  Surgeon: Rogene Houston, MD;  Location: [facility];  Service: Endoscopy;  Laterality: N/A;  . CHOLECYSTECTOMY     s/p  . COLONOSCOPY  06/16/06. 04/27/2007   proctitis seen, biopsies showed chronic active colitis, no dysplasia   . EYE SURGERY     clear lense extraction 1998   . FLEXIBLE SIGMOIDOSCOPY N/A 12/28/2014   Procedure: FLEXIBLE SIGMOIDOSCOPY WITH PROPOFOL;  Surgeon: Rogene Houston, MD;  Location: [facility];  Service: Endoscopy;  Laterality: N/A;    Family History  Problem Relation Age of Onset  . Heart attack Mother 39       Died in her sleep  . Diabetes Mother   . Heart attack Father 87       Died in her sleep  . Heart attack Brother 40        Defib, CABG  . Diabetes Brother   . Kidney disease Brother        dyalasis   . Cancer Sister        liver, breast   . Heart disease Sister     New complaints: None today  Social history: Lives by herself     Review of Systems  Constitutional: Negative for diaphoresis.  Eyes: Negative for pain.  Respiratory: Negative for shortness of breath.   Cardiovascular: Negative for  chest pain, palpitations and leg swelling.  Gastrointestinal: Negative for abdominal pain.  Endocrine: Negative for polydipsia.  Skin: Negative for rash.  Neurological: Negative for dizziness, weakness and headaches.  Hematological: Does not bruise/bleed easily.  All other systems reviewed and are negative.      Objective:   Physical Exam Vitals and nursing note reviewed.  Constitutional:      General: She is not in acute distress.    Appearance: Normal appearance. She is well-developed and well-nourished.  HENT:     Head: Normocephalic.     Nose: Nose normal.     Mouth/Throat:     Mouth: Oropharynx is clear and moist.  Eyes:     Extraocular Movements: EOM normal.     Pupils: Pupils are equal, round, and reactive to light.  Neck:     Vascular: No carotid bruit or JVD.  Cardiovascular:     Rate and Rhythm: Normal rate and regular rhythm.     Pulses: Intact distal pulses.     Heart sounds: Normal heart sounds.  Pulmonary:     Effort: Pulmonary effort is normal. No respiratory distress.     Breath sounds: Normal breath sounds. No wheezing or rales.  Chest:     Chest wall: No tenderness.  Abdominal:     General: Bowel sounds are normal. There is no distension or abdominal bruit. Aorta is normal.     Palpations: Abdomen is soft. There is no hepatomegaly, splenomegaly, mass or pulsatile mass.     Tenderness: There is no abdominal tenderness.  Musculoskeletal:        General: No edema. Normal range of motion.     Cervical back: Normal range of motion and neck supple.  Lymphadenopathy:      Cervical: No cervical adenopathy.  Skin:    General: Skin is warm and dry.  Neurological:     Mental Status: She is alert and oriented to person, place, and time.     Deep Tendon Reflexes: Reflexes are normal and symmetric.  Psychiatric:        Mood and Affect: Mood and affect normal.        Behavior: Behavior normal.        Thought Content: Thought content normal.        Judgment: Judgment normal.    BP 136/84   Pulse 83   Temp 98.4 F (36.9 C) (Temporal)   Resp 20   Ht 5' 9"  (1.753 m)   Wt 169 lb (76.7 kg)   SpO2 95%   BMI 24.96 kg/m          Assessment & Plan:  Kaedence Connelly comes in today with chief complaint of Medical Management of Chronic Issues   Diagnosis and orders addressed:  1. Essential hypertension Low sodium diet  2. ULCERATIVE COLITIS, LEFT SIDED Watch diet to prevent flare up - ondansetron (ZOFRAN) 4 MG tablet; Take 1 tablet (4 mg total) by mouth every 8 (eight) hours as needed for nausea or vomiting.  Dispense: 20 tablet; Refill: 2 - rosuvastatin (CRESTOR) 10 MG tablet; Take 1 tablet (10 mg total) by mouth daily.  Dispense: 90 tablet; Refill: 0 - hydrocortisone (ANUSOL-HC) 2.5 % rectal cream; Place 1 application rectally 2 (two) times daily.  Dispense: 30 g; Refill: 0  3. Stage 3b chronic kidney disease ([place]) Labs pending  4. Bradycardia  5. Hyperlipidemia with target LDL less than 100 Low fat diet  6. Recurrent major depressive disorder, in full remission [facility]) Stress management  7. Fibromyalgia Keep muscles warm - oxyCODONE (ROXICODONE) 5 MG immediate release tablet; Take 1 tablet (5 mg total) by mouth 2 (two) times daily.  Dispense: 60 tablet; Refill: 0 - oxyCODONE (ROXICODONE) 5 MG immediate release tablet; Take 1 tablet (5 mg total) by mouth 2 (two) times daily.  Dispense: 60 tablet; Refill: 0 - oxyCODONE (ROXICODONE) 5 MG immediate release tablet; Take 1 tablet (5 mg total) by mouth 2 (two) times daily.  Dispense: 60 tablet; Refill:  0 - methocarbamol (ROBAXIN) 500 MG tablet; Take 1 tablet (500 mg total) by mouth every 6 (six) hours as needed for muscle spasms.  Dispense: 30 tablet; Refill: 1  8. Primary insomnia Bedtime rotuine  9. Type 2 diabetes mellitus without complication, without long-term current use of insulin ([place]) New diagnosis Stat farxiga 37m daily Keep diary of blood sugar Prescription for glucpmeter   Labs pending Health Maintenance reviewed Diet and exercise encouraged  Follow up plan: 1 month   MPine [place] FNP

## 2020-05-27 NOTE — Patient Instructions (Signed)

## 2020-05-28 ENCOUNTER — Telehealth: Payer: Self-pay | Admitting: *Deleted

## 2020-05-28 DIAGNOSIS — E119 Type 2 diabetes mellitus without complications: Secondary | ICD-10-CM

## 2020-05-28 MED ORDER — EMPAGLIFLOZIN 10 MG PO TABS: 10.0000 mg | ORAL_TABLET | Freq: Every day | ORAL | 2 refills | Status: DC

## 2020-05-28 NOTE — Telephone Encounter (Signed)
PA denied- She must try and fail jardiance and invokana before they will approve farxiga

## 2020-05-28 NOTE — Telephone Encounter (Signed)
Farxiga 5 mg PA started   Key: [id number] Sent to plan

## 2020-06-05 ENCOUNTER — Telehealth: Payer: Self-pay

## 2020-06-05 NOTE — Telephone Encounter (Signed)
Patient is aware you are out today will be back tomorrow when sugar checked after meds it was 389. She is going to keep check on sugar watch what she was eatting. Do you want her to come back in sooner for a DM check?

## 2020-06-06 NOTE — Telephone Encounter (Signed)
Patient notified and verbalized understanding. 

## 2020-06-06 NOTE — Telephone Encounter (Signed)
Just continue to keep a check

## 2020-06-29 ENCOUNTER — Other Ambulatory Visit: Payer: Self-pay | Admitting: Nurse Practitioner

## 2020-06-29 DIAGNOSIS — K5150 Left sided colitis without complications: Secondary | ICD-10-CM

## 2020-07-02 ENCOUNTER — Other Ambulatory Visit: Payer: Self-pay

## 2020-07-02 ENCOUNTER — Ambulatory Visit: Payer: Medicare Other | Admitting: Nurse Practitioner

## 2020-07-02 ENCOUNTER — Encounter: Payer: Self-pay | Admitting: Nurse Practitioner

## 2020-07-02 VITALS — BP 116/71 | HR 87 | Temp 97.8°F | Resp 20 | Ht 69.0 in | Wt 165.0 lb

## 2020-07-02 DIAGNOSIS — R300 Dysuria: Secondary | ICD-10-CM | POA: Diagnosis not present

## 2020-07-02 DIAGNOSIS — E119 Type 2 diabetes mellitus without complications: Secondary | ICD-10-CM

## 2020-07-02 LAB — URINALYSIS, COMPLETE
Bilirubin, UA: NEGATIVE
Ketones, UA: NEGATIVE
Leukocytes,UA: NEGATIVE
Nitrite, UA: NEGATIVE
Protein,UA: NEGATIVE
RBC, UA: NEGATIVE
Specific Gravity, UA: <1.005 — ABNORMAL LOW (ref 1.005–1.030)
Urobilinogen, Ur: 0.2 mg/dL (ref 0.2–1.0)
pH, UA: 5 (ref 5.0–7.5)

## 2020-07-02 LAB — MICROSCOPIC EXAMINATION: RBC, Urine: NONE SEEN /hpf (ref 0–2)

## 2020-07-02 LAB — BAYER DCA HB A1C WAIVED: HB A1C (BAYER DCA - WAIVED): 11.1 % — ABNORMAL HIGH (ref <7.0)

## 2020-07-02 MED ORDER — JANUMET 50-500 MG PO TABS
1.0000 | ORAL_TABLET | Freq: Two times a day (BID) | ORAL | 1 refills | Status: DC
Start: 2020-07-02 — End: 2020-07-30

## 2020-07-02 NOTE — Progress Notes (Signed)
   Subjective:    Patient ID: [name], female    DOB: 06/01/1940, 80 y.o.   MRN: [id number]   Chief Complaint: Diabetes Type 2 A1c recheck  HPI Pt presents today for a recheck of her A1c. She was recently diagnosed with Type II DM. Her last A1c was 8.1 Dec 6, FBG 236. Insurance would not pay for faxiga, so began Jardiance one month ago. She is here for an A1c recheck. She states she has been feeling dizzy, weak, muscle pains, HA, nausea, burning on urination, chills.  She has continued to take the Jardiance despite her symptoms.   FBG been running between 130 and 180.  Trying to avoid sugar and staying low carb.    Review of Systems  Constitutional: Positive for fatigue. Negative for chills and fever.  Respiratory: Negative for cough, shortness of breath and wheezing.   Cardiovascular: Negative for chest pain and palpitations.  Gastrointestinal: Positive for diarrhea. Negative for abdominal pain and constipation.  Genitourinary: Positive for dysuria, frequency, pelvic pain and urgency.  Musculoskeletal: Positive for gait problem.  Neurological: Positive for dizziness and headaches.       Objective:   Physical Exam Constitutional:      Appearance: Normal appearance.  Cardiovascular:     Rate and Rhythm: Normal rate and regular rhythm.     Pulses: Normal pulses.     Heart sounds: Normal heart sounds.  Pulmonary:     Effort: Pulmonary effort is normal.     Breath sounds: Normal breath sounds.  Abdominal:     Palpations: Abdomen is soft.     Tenderness: There is abdominal tenderness.  Musculoskeletal:        General: Normal range of motion.     Cervical back: Normal range of motion.  Skin:    General: Skin is warm and dry.     Capillary Refill: Capillary refill takes less than 2 seconds.  Neurological:     Mental Status: She is alert and oriented to person, place, and time.  Psychiatric:        Mood and Affect: Mood normal.        Behavior: Behavior normal.      Vitals:   07/02/20 1414  BP: 116/71  Pulse: 87  Resp: 20  Temp: 97.8 F (36.6 C)  SpO2: 97%    A1c 11.1% today  UA (-) for leuks and nitrites     Assessment & Plan:   [name] comes in today with chief complaint of Diabetes   Diagnosis and orders addressed:  1. Type 2 diabetes mellitus without complication, without long-term current use of insulin ([place]) Stop Jardiance. Begin Janumet. Monitor FBG every AM. No need to do several readings through the day. Low carb/Low sugar diet reinforced.   - Bayer DCA Hb A1c Waived - sitaGLIPtin-metformin (JANUMET) 50-500 MG tablet; Take 1 tablet by mouth 2 (two) times daily with a meal.  Dispense: 60 tablet; Refill: 1  2. Dysuria  - Urinalysis, Complete    Follow up plan: 30 days.  Dollene Primrose, RN, BSN, FNP-Student  Mary-Margaret Hassell Done, FNP

## 2020-07-02 NOTE — Patient Instructions (Signed)

## 2020-07-15 ENCOUNTER — Emergency Department (HOSPITAL_COMMUNITY): Payer: Medicare Other

## 2020-07-15 ENCOUNTER — Encounter (HOSPITAL_COMMUNITY): Payer: Self-pay | Admitting: Emergency Medicine

## 2020-07-15 ENCOUNTER — Other Ambulatory Visit: Payer: Self-pay

## 2020-07-15 ENCOUNTER — Emergency Department (HOSPITAL_COMMUNITY)
Admission: EM | Admit: 2020-07-15 | Discharge: 2020-07-16 | Disposition: A | Discharge status: Home / Self Care | Payer: Medicare Other | Attending: Emergency Medicine | Admitting: Emergency Medicine

## 2020-07-15 DIAGNOSIS — K760 Fatty (change of) liver, not elsewhere classified: Secondary | ICD-10-CM | POA: Diagnosis not present

## 2020-07-15 DIAGNOSIS — K5641 Fecal impaction: Secondary | ICD-10-CM | POA: Diagnosis not present

## 2020-07-15 DIAGNOSIS — K3189 Other diseases of stomach and duodenum: Secondary | ICD-10-CM | POA: Diagnosis not present

## 2020-07-15 DIAGNOSIS — K5903 Drug induced constipation: Secondary | ICD-10-CM

## 2020-07-15 DIAGNOSIS — R188 Other ascites: Secondary | ICD-10-CM | POA: Diagnosis not present

## 2020-07-15 DIAGNOSIS — Z79899 Other long term (current) drug therapy: Secondary | ICD-10-CM | POA: Insufficient documentation

## 2020-07-15 DIAGNOSIS — R110 Nausea: Secondary | ICD-10-CM | POA: Diagnosis not present

## 2020-07-15 DIAGNOSIS — I10 Essential (primary) hypertension: Secondary | ICD-10-CM | POA: Insufficient documentation

## 2020-07-15 DIAGNOSIS — N390 Urinary tract infection, site not specified: Secondary | ICD-10-CM | POA: Diagnosis not present

## 2020-07-15 DIAGNOSIS — E119 Type 2 diabetes mellitus without complications: Secondary | ICD-10-CM | POA: Insufficient documentation

## 2020-07-15 DIAGNOSIS — K6289 Other specified diseases of anus and rectum: Secondary | ICD-10-CM

## 2020-07-15 DIAGNOSIS — Z7984 Long term (current) use of oral hypoglycemic drugs: Secondary | ICD-10-CM | POA: Insufficient documentation

## 2020-07-15 DIAGNOSIS — K5900 Constipation, unspecified: Secondary | ICD-10-CM | POA: Diagnosis not present

## 2020-07-15 DIAGNOSIS — R1084 Generalized abdominal pain: Secondary | ICD-10-CM | POA: Diagnosis not present

## 2020-07-15 DIAGNOSIS — Z91040 Latex allergy status: Secondary | ICD-10-CM | POA: Insufficient documentation

## 2020-07-15 DIAGNOSIS — K6389 Other specified diseases of intestine: Secondary | ICD-10-CM | POA: Diagnosis not present

## 2020-07-15 DIAGNOSIS — R000 Tachycardia, unspecified: Secondary | ICD-10-CM | POA: Diagnosis not present

## 2020-07-15 DIAGNOSIS — R1030 Lower abdominal pain, unspecified: Secondary | ICD-10-CM | POA: Diagnosis present

## 2020-07-15 HISTORY — DX: Type 2 diabetes mellitus without complications: E11.9

## 2020-07-15 LAB — CBC WITH DIFFERENTIAL/PLATELET
Abs Immature Granulocytes: 0.03 10*3/uL (ref 0.00–0.07)
Basophils Absolute: 0 10*3/uL (ref 0.0–0.1)
Basophils Relative: 0 %
Eosinophils Absolute: 0 10*3/uL (ref 0.0–0.5)
Eosinophils Relative: 0 %
HCT: 37.9 % (ref 36.0–46.0)
Hemoglobin: 12.6 g/dL (ref 12.0–15.0)
Immature Granulocytes: 0 %
Lymphocytes Relative: 15 %
Lymphs Abs: 1.2 10*3/uL (ref 0.7–4.0)
MCH: 30.5 pg (ref 26.0–34.0)
MCHC: 33.2 g/dL (ref 30.0–36.0)
MCV: 91.8 fL (ref 80.0–100.0)
Monocytes Absolute: 0.6 10*3/uL (ref 0.1–1.0)
Monocytes Relative: 8 %
Neutro Abs: 6.1 10*3/uL (ref 1.7–7.7)
Neutrophils Relative %: 77 %
Platelets: 236 10*3/uL (ref 150–400)
RBC: 4.13 MIL/uL (ref 3.87–5.11)
RDW: 13.1 % (ref 11.5–15.5)
WBC: 8 10*3/uL (ref 4.0–10.5)
nRBC: 0 % (ref 0.0–0.2)

## 2020-07-15 LAB — COMPREHENSIVE METABOLIC PANEL
ALT: 18 U/L (ref 0–44)
AST: 20 U/L (ref 15–41)
Albumin: 3.4 g/dL — ABNORMAL LOW (ref 3.5–5.0)
Alkaline Phosphatase: 42 U/L (ref 38–126)
Anion gap: 11 (ref 5–15)
BUN: 14 mg/dL (ref 8–23)
CO2: 21 mmol/L — ABNORMAL LOW (ref 22–32)
Calcium: 8.3 mg/dL — ABNORMAL LOW (ref 8.9–10.3)
Chloride: 98 mmol/L (ref 98–111)
Creatinine, Ser: 1.38 mg/dL — ABNORMAL HIGH (ref 0.44–1.00)
GFR, Estimated: 39 mL/min — ABNORMAL LOW (ref >60)
Glucose, Bld: 206 mg/dL — ABNORMAL HIGH (ref 70–99)
Potassium: 3.4 mmol/L — ABNORMAL LOW (ref 3.5–5.1)
Sodium: 130 mmol/L — ABNORMAL LOW (ref 135–145)
Total Bilirubin: 1.5 mg/dL — ABNORMAL HIGH (ref 0.3–1.2)
Total Protein: 6.1 g/dL — ABNORMAL LOW (ref 6.5–8.1)

## 2020-07-15 LAB — LIPASE, BLOOD: Lipase: 21 U/L (ref 11–51)

## 2020-07-15 MED ORDER — IOHEXOL 300 MG/ML  SOLN
75.0000 mL | Freq: Once | INTRAMUSCULAR | Status: AC | PRN
  Administered 2020-07-15: 75 mL via INTRAVENOUS

## 2020-07-15 MED ORDER — ONDANSETRON HCL 4 MG/2ML IJ SOLN
4.0000 mg | Freq: Once | INTRAMUSCULAR | Status: AC
  Administered 2020-07-15: 4 mg via INTRAVENOUS
  Filled 2020-07-15: qty 2

## 2020-07-15 MED ORDER — HYDROMORPHONE HCL 1 MG/ML IJ SOLN
0.5000 mg | Freq: Once | INTRAMUSCULAR | Status: AC
  Administered 2020-07-16: 0.5 mg via INTRAVENOUS
  Filled 2020-07-15: qty 1

## 2020-07-15 MED ORDER — SODIUM CHLORIDE 0.9 % IV BOLUS
500.0000 mL | Freq: Once | INTRAVENOUS | Status: AC
  Administered 2020-07-15: 500 mL via INTRAVENOUS

## 2020-07-15 MED ORDER — HYDROMORPHONE HCL 1 MG/ML IJ SOLN
0.5000 mg | Freq: Once | INTRAMUSCULAR | Status: AC
  Administered 2020-07-15: 0.5 mg via INTRAVENOUS
  Filled 2020-07-15: qty 1

## 2020-07-15 NOTE — ED Notes (Signed)
Pt up to bedside commode at this time.

## 2020-07-15 NOTE — ED Provider Notes (Signed)
[facility] DEPT Provider Note: Georgena Spurling, MD, FACEP  CSN: [id number] MRN: [id number] ARRIVAL: 07/15/20 at [place]: [place]  Abdominal Pain and Constipation   HISTORY OF PRESENT ILLNESS  07/15/20 11:18 PM [name] is a 80 y.o. female with a history of ulcerative colitis.  She is here with abdominal pain for the past 5 days.  She does not have a bowel movement during that time.  The pain is located in her lower abdomen and she rates it currently as a 9 out of 10.  It is worse with movement or palpation.  It has both dull and sharp components.  She has had nausea but no vomiting.  She has had little appetite.  She was given Zofran and Dilaudid prior to my evaluation without adequate control of her pain.  The patient is on chronic oxycodone 5 mg, receiving 60 tablets every month.  She did try Metamucil and a dose of MiraLAX this weekend without relief of her constipation.   Past Medical History:  Diagnosis Date  . Anemia due to blood loss 05/02/2011  . Anxiety   . Arthritis    rheumatoid   . Collagen vascular disease ([place])    ra  . Diabetes mellitus without complication ([place])   . Fibromyalgia   . Hyperglycemia, drug-induced 05/02/2011  . Hypertension   . Ulcerative colitis     Past Surgical History:  Procedure Laterality Date  . ABDOMINAL HYSTERECTOMY    . BIOPSY N/A 12/28/2014   Procedure: SIGMOID AND RECTAL BIOPSIES;  Surgeon: Rogene Houston, MD;  Location: [facility];  Service: Endoscopy;  Laterality: N/A;  . CHOLECYSTECTOMY     s/p  . COLONOSCOPY  06/16/06. 04/27/2007   proctitis seen, biopsies showed chronic active colitis, no dysplasia   . EYE SURGERY     clear lense extraction 1998   . FLEXIBLE SIGMOIDOSCOPY N/A 12/28/2014   Procedure: FLEXIBLE SIGMOIDOSCOPY WITH PROPOFOL;  Surgeon: Rogene Houston, MD;  Location: [facility];  Service: Endoscopy;  Laterality: N/A;    Family History  Problem Relation Age of Onset  . Heart attack Mother 59        Died in her sleep  . Diabetes Mother   . Heart attack Father 86       Died in her sleep  . Heart attack Brother 65       Defib, CABG  . Diabetes Brother   . Kidney disease Brother        dyalasis   . Cancer Sister        liver, breast   . Heart disease Sister     Social History   Tobacco Use  . Smoking status: Never Smoker  . Smokeless tobacco: Never Used  Vaping Use  . Vaping Use: Never used  Substance Use Topics  . Alcohol use: Never    Alcohol/week: 0.0 standard drinks    Comment: Ocassionally a small glass of wine. Patient states that is has been 2 months since she drank anything.  . Drug use: No    Prior to Admission medications   Medication Sig Start Date End Date Taking? Authorizing Provider  amLODipine (NORVASC) 2.5 MG tablet Take 1 tablet (2.5 mg total) by mouth daily. 02/26/20   Hassell Done Mary-Margaret, FNP  antiseptic oral rinse (BIOTENE) LIQD 1 application by Mouth Rinse route daily. FOR DRY MOUTH    [provider]  benazepril-hydrochlorthiazide (LOTENSIN HCT) 20-12.5 MG tablet Take 2 tablets by mouth daily. TAKE (  2) TABLETS  BY MOUTH ONCE DAILY. 02/26/20   [name], FNP  blood glucose meter kit and supplies Dispense based on patient and insurance preference. Use up to four times daily as directed. (FOR ICD-10 E10.9, E11.9). 05/27/20   [name], FNP  blood glucose meter kit and supplies Dispense based on patient and insurance preference. Use up to four times daily as directed. (FOR ICD-10 E10.9, E11.9). 05/27/20   Hassell Done, Mary-Margaret, FNP  busPIRone (BUSPAR) 10 MG tablet Take 1 tablet (10 mg total) by mouth 3 (three) times daily. 02/26/20   Hassell Done, Mary-Margaret, FNP  co-enzyme Q-10 50 MG capsule Take 50 mg by mouth once a week.    [provider]  dicyclomine (BENTYL) 20 MG tablet Take 1/2-1 tablets (10-20 mg total) by mouth daily. 07/01/20   Hassell Done, Mary-Margaret, FNP  escitalopram (LEXAPRO) 20 MG tablet Take 1 tablet (20  mg total) by mouth daily. 02/26/20   Hassell Done, Mary-Margaret, FNP  hydrocortisone (ANUSOL-HC) 2.5 % rectal cream Place 1 application rectally 2 (two) times daily. 05/27/20   Hassell Done, Mary-Margaret, FNP  Loperamide HCl (IMODIUM PO) Take 1 tablet by mouth daily as needed (diarrhea).    [provider]  methocarbamol (ROBAXIN) 500 MG tablet Take 1 tablet (500 mg total) by mouth every 6 (six) hours as needed for muscle spasms. 05/27/20   [name], FNP  Multiple Vitamins-Minerals (WOMENS MULTI VITAMIN & MINERAL PO) Take 1 tablet by mouth every other day.    [provider]  Omega-3 Fatty Acids (FISH OIL) 1000 MG CPDR Take 3 capsules by mouth daily.     [provider]  ondansetron (ZOFRAN) 4 MG tablet Take 1 tablet (4 mg total) by mouth every 8 (eight) hours as needed for nausea or vomiting. 05/27/20   Hassell Done, Mary-Margaret, FNP  oxyCODONE (ROXICODONE) 5 MG immediate release tablet Take 1 tablet (5 mg total) by mouth 2 (two) times daily. 08/09/20 09/08/20  Hassell Done, Mary-Margaret, FNP  oxyCODONE (ROXICODONE) 5 MG immediate release tablet Take 1 tablet (5 mg total) by mouth 2 (two) times daily. 07/10/20 08/09/20  Hassell Done, Mary-Margaret, FNP  oxyCODONE (ROXICODONE) 5 MG immediate release tablet Take 1 tablet (5 mg total) by mouth 2 (two) times daily. 06/10/20 07/10/20  Hassell Done Mary-Margaret, FNP  Probiotic Product (PROBIOTIC FORMULA PO) Take 1-2 capsules by mouth every morning.    [provider]  QUEtiapine (SEROQUEL) 100 MG tablet Take 1 tablet (100 mg total) by mouth at bedtime. 02/26/20   Hassell Done, Mary-Margaret, FNP  rosuvastatin (CRESTOR) 10 MG tablet Take 1 tablet (10 mg total) by mouth daily. 05/27/20   [name], FNP  shark liver oil-cocoa butter (PREPARATION H) 0.25-3-85.5 % suppository Place 1 suppository rectally as needed for hemorrhoids.    [provider]  sitaGLIPtin-metformin (JANUMET) 50-500 MG tablet Take 1 tablet by mouth 2 (two) times daily  with a meal. 07/02/20   Hassell Done, Mary-Margaret, FNP  vitamin E 400 UNIT capsule Take 400 Units by mouth every morning.    [provider]  sodium chloride (OCEAN) 0.65 % SOLN nasal spray Place 2 sprays into the nose as needed for congestion. 05/05/11 06/07/11  Delfina Redwood, MD    Allergies Azathioprine, Tape, Codeine, Lac bovis, Penicillins, Povidone-iodine, Procaine hcl, Sulfonamide derivatives, Tramadol, Acyclovir and related, Azithromycin, Clonazepam, Dairy aid [lactase], Doxycycline, Latex, and Niacin and related   REVIEW OF SYSTEMS  Negative except as noted here or in the History of Present Illness.   PHYSICAL EXAMINATION  Initial Vital Signs Blood  pressure (!) 153/86, pulse 96, temperature 97.8 F (36.6 C), temperature source Oral, resp. rate 20, height 5' 9"  (1.753 m), weight 75 kg, SpO2 95 %.  Examination General: Well-developed, well-nourished female in no acute distress; appearance consistent with age of record HENT: normocephalic; atraumatic Eyes: pupils equal, round and reactive to light; extraocular muscles intact Neck: supple Heart: regular rate and rhythm Lungs: clear to auscultation bilaterally Abdomen: soft; nondistended; lower abdominal tenderness; bowel sounds present Rectal: Soft fecal impaction; perianal skin inflammation Extremities: No deformity; full range of motion; pulses normal Neurologic: Awake, alert and oriented; motor function intact in all extremities and symmetric; no facial droop Skin: Warm and dry Psychiatric: Flat affect   RESULTS  Summary of this visit's results, reviewed and interpreted by myself:   EKG Interpretation  Date/Time:    Ventricular Rate:    PR Interval:    QRS Duration:   QT Interval:    QTC Calculation:   R Axis:     Text Interpretation:        Laboratory Studies: Results for orders placed or performed during the hospital encounter of 07/15/20 (from the past 24 hour(s))  CBC with Differential      Status: None   Collection Time: 07/15/20 10:55 PM  Result Value Ref Range   WBC 8.0 4.0 - 10.5 K/uL   RBC 4.13 3.87 - 5.11 MIL/uL   Hemoglobin 12.6 12.0 - 15.0 g/dL   HCT 37.9 36.0 - 46.0 %   MCV 91.8 80.0 - 100.0 fL   MCH 30.5 26.0 - 34.0 pg   MCHC 33.2 30.0 - 36.0 g/dL   RDW 13.1 11.5 - 15.5 %   Platelets 236 150 - 400 K/uL   nRBC 0.0 0.0 - 0.2 %   Neutrophils Relative % 77 %   Neutro Abs 6.1 1.7 - 7.7 K/uL   Lymphocytes Relative 15 %   Lymphs Abs 1.2 0.7 - 4.0 K/uL   Monocytes Relative 8 %   Monocytes Absolute 0.6 0.1 - 1.0 K/uL   Eosinophils Relative 0 %   Eosinophils Absolute 0.0 0.0 - 0.5 K/uL   Basophils Relative 0 %   Basophils Absolute 0.0 0.0 - 0.1 K/uL   Immature Granulocytes 0 %   Abs Immature Granulocytes 0.03 0.00 - 0.07 K/uL  Comprehensive metabolic panel     Status: Abnormal   Collection Time: 07/15/20 10:55 PM  Result Value Ref Range   Sodium 130 (L) 135 - 145 mmol/L   Potassium 3.4 (L) 3.5 - 5.1 mmol/L   Chloride 98 98 - 111 mmol/L   CO2 21 (L) 22 - 32 mmol/L   Glucose, Bld 206 (H) 70 - 99 mg/dL   BUN 14 8 - 23 mg/dL   Creatinine, Ser 1.38 (H) 0.44 - 1.00 mg/dL   Calcium 8.3 (L) 8.9 - 10.3 mg/dL   Total Protein 6.1 (L) 6.5 - 8.1 g/dL   Albumin 3.4 (L) 3.5 - 5.0 g/dL   AST 20 15 - 41 U/L   ALT 18 0 - 44 U/L   Alkaline Phosphatase 42 38 - 126 U/L   Total Bilirubin 1.5 (H) 0.3 - 1.2 mg/dL   GFR, Estimated 39 (L) >60 mL/min   Anion gap 11 5 - 15  Lipase, blood     Status: None   Collection Time: 07/15/20 10:55 PM  Result Value Ref Range   Lipase 21 11 - 51 U/L  Urinalysis, Routine w reflex microscopic Urine, Clean Catch     Status: Abnormal  Collection Time: 07/16/20 12:40 AM  Result Value Ref Range   Color, Urine YELLOW YELLOW   APPearance HAZY (A) CLEAR   Specific Gravity, Urine 1.015 1.005 - 1.030   pH 6.0 5.0 - 8.0   Glucose, UA 50 (A) NEGATIVE mg/dL   Hgb urine dipstick MODERATE (A) NEGATIVE   Bilirubin Urine NEGATIVE NEGATIVE   Ketones,  ur 5 (A) NEGATIVE mg/dL   Protein, ur NEGATIVE NEGATIVE mg/dL   Nitrite NEGATIVE NEGATIVE   Leukocytes,Ua LARGE (A) NEGATIVE   RBC / HPF 6-10 0 - 5 RBC/hpf   WBC, UA 21-50 0 - 5 WBC/hpf   Bacteria, UA RARE (A) NONE SEEN   Squamous Epithelial / LPF 0-5 0 - 5   Hyaline Casts, UA PRESENT    Imaging Studies: CT ABDOMEN PELVIS W CONTRAST  Result Date: 07/16/2020 CLINICAL DATA:  Left lower quadrant pain and tenderness. EXAM: CT ABDOMEN AND PELVIS WITH CONTRAST TECHNIQUE: Multidetector CT imaging of the abdomen and pelvis was performed using the standard protocol following bolus administration of intravenous contrast. CONTRAST:  66m OMNIPAQUE IOHEXOL 300 MG/ML  SOLN COMPARISON:  09/04/2017 FINDINGS: Lower chest: Mild atelectasis in the lung bases. Cardiac enlargement. Hepatobiliary: Diffuse fatty infiltration of the liver. No focal liver abnormality is seen. Status post cholecystectomy. No biliary dilatation. Pancreas: Unremarkable. No pancreatic ductal dilatation or surrounding inflammatory changes. Spleen: Normal in size without focal abnormality. Adrenals/Urinary Tract: Adrenal glands are unremarkable. Kidneys are normal, without renal calculi, focal lesion, or hydronephrosis. Bladder is unremarkable. Stomach/Bowel: Stomach, small bowel, and colon are not abnormally distended. No wall thickening or inflammatory changes are appreciated. The colon is diffusely stool-filled, particularly in the rectosigmoid region. Fluid in the right colon. Mild wall thickening suggested in the descending and sigmoid colon. Changes likely to represent constipation possibly associated with colitis. Appendix is not identified. Vascular/Lymphatic: No significant vascular findings are present. No enlarged abdominal or pelvic lymph nodes. Reproductive: Status post hysterectomy. No adnexal masses. Other: No abdominal wall hernia or abnormality. No abdominopelvic ascites. Musculoskeletal: Superior endplate compression of L3  demonstrates mild progression since previous study. Degenerative changes consistent with chronic process. IMPRESSION: 1. Diffusely stool-filled colon, particularly in the rectosigmoid region. Mild wall thickening in the descending and sigmoid colon. Changes likely to represent constipation possibly associated with colitis. 2. Diffuse fatty infiltration of the liver. 3. Mild progression of superior endplate compression of L3. Electronically Signed   By: WLucienne CapersM.D.   On: 07/16/2020 00:20    ED COURSE and MDM  Nursing notes, initial and subsequent vitals signs, including pulse oximetry, reviewed and interpreted by myself.  Vitals:   07/16/20 0130 07/16/20 0200 07/16/20 0230 07/16/20 0300  BP: 125/77 (!) 143/88 138/79 (!) 148/94  Pulse: 92   99  Resp:    19  Temp:      TempSrc:      SpO2: 93%   98%  Weight:      Height:       Medications  oxyCODONE (Oxy IR/ROXICODONE) immediate release tablet 5 mg (has no administration in time range)  fosfomycin (MONUROL) packet 3 g (has no administration in time range)  sodium chloride 0.9 % bolus 500 mL (0 mLs Intravenous Stopped 07/16/20 0000)  HYDROmorphone (DILAUDID) injection 0.5 mg (0.5 mg Intravenous Given 07/15/20 2239)  ondansetron (ZOFRAN) injection 4 mg (4 mg Intravenous Given 07/15/20 2240)  HYDROmorphone (DILAUDID) injection 0.5 mg (0.5 mg Intravenous Given 07/16/20 0043)  iohexol (OMNIPAQUE) 300 MG/ML solution 75 mL (75 mLs Intravenous Contrast  Given 07/15/20 2352)  lidocaine (XYLOCAINE) 2 % jelly 1 application (1 application Other Given 07/16/20 0326)  sorbitol, milk of mag, mineral oil, glycerin (SMOG) enema (960 mLs Rectal Given 07/16/20 0326)   5:00 AM Patient unable to tolerate an enema.  Her impaction was not amenable to manual removal due to the softness/looseness of the stool.  She has been able to void a small amount on her own with some relief in her discomfort.  We will start her on daily MiraLAX.  We will also provide a  barrier cream for her perianal irritation.  Urinalysis is consistent with urinary tract infection we will treat with a dose of fosfomycin.   PROCEDURES  Procedures   ED DIAGNOSES     ICD-10-CM   1. Drug-induced constipation  K59.03   2. Fecal impaction ([place])  K56.41   3. Lower urinary tract infectious disease  N39.0   4. Perirectal skin irritation  K62.89        Chantay Whitelock, Jenny Reichmann, MD 07/16/20 [phone]

## 2020-07-15 NOTE — ED Triage Notes (Signed)
Pt c/o severe abd cramping. Pt states she has not had a bowel movement since Wed. Pt states she has taken stool softners with no relief.

## 2020-07-15 NOTE — ED Provider Notes (Signed)
MSE note.  Patient with left lower quadrant pain and tenderness.  Labs and x-rays ordered.  Patient stable   Milton Ferguson, MD 07/15/20 2228

## 2020-07-16 DIAGNOSIS — K3189 Other diseases of stomach and duodenum: Secondary | ICD-10-CM | POA: Diagnosis not present

## 2020-07-16 DIAGNOSIS — R188 Other ascites: Secondary | ICD-10-CM | POA: Diagnosis not present

## 2020-07-16 DIAGNOSIS — K6389 Other specified diseases of intestine: Secondary | ICD-10-CM | POA: Diagnosis not present

## 2020-07-16 DIAGNOSIS — K760 Fatty (change of) liver, not elsewhere classified: Secondary | ICD-10-CM | POA: Diagnosis not present

## 2020-07-16 LAB — URINALYSIS, ROUTINE W REFLEX MICROSCOPIC
Bilirubin Urine: NEGATIVE
Glucose, UA: 50 mg/dL — AB
Ketones, ur: 5 mg/dL — AB
Nitrite: NEGATIVE
Protein, ur: NEGATIVE mg/dL
Specific Gravity, Urine: 1.015 (ref 1.005–1.030)
pH: 6 (ref 5.0–8.0)

## 2020-07-16 MED ORDER — SORBITOL 70 % SOLN
960.0000 mL | TOPICAL_OIL | Freq: Once | ORAL | Status: AC
  Administered 2020-07-16: 960 mL via RECTAL
  Filled 2020-07-16: qty 473

## 2020-07-16 MED ORDER — LIDOCAINE HCL URETHRAL/MUCOSAL 2 % EX GEL
1.0000 "application " | Freq: Once | CUTANEOUS | Status: AC
  Administered 2020-07-16: 1
  Filled 2020-07-16: qty 10

## 2020-07-16 MED ORDER — OXYCODONE HCL 5 MG PO TABS
5.0000 mg | ORAL_TABLET | Freq: Once | ORAL | Status: AC
  Administered 2020-07-16: 5 mg via ORAL
  Filled 2020-07-16: qty 1

## 2020-07-16 MED ORDER — POLYETHYLENE GLYCOL 3350 17 G PO PACK: PACK | ORAL | 0 refills | Status: DC

## 2020-07-16 MED ORDER — FOSFOMYCIN TROMETHAMINE 3 G PO PACK
3.0000 g | PACK | Freq: Once | ORAL | Status: AC
  Administered 2020-07-16: 3 g via ORAL
  Filled 2020-07-16: qty 3

## 2020-07-16 MED ORDER — MAGNESIUM CITRATE PO SOLN
1.0000 | Freq: Once | ORAL | Status: AC
  Administered 2020-07-16: 1 via ORAL
  Filled 2020-07-16: qty 296

## 2020-07-16 NOTE — ED Notes (Signed)
[name] [phone]

## 2020-07-16 NOTE — ED Notes (Signed)
Pt remains unsuccessful with BM

## 2020-07-16 NOTE — ED Notes (Signed)
Attempt to give enema by rn. Pt made rn stop enema during admin then got up to the restroom. Pt c/o abdominal pain.

## 2020-07-16 NOTE — ED Notes (Signed)
[name] on the way for pt

## 2020-07-16 NOTE — ED Notes (Signed)
Pt took 1 sip of Mag. Citrate and refuses to drink any more

## 2020-07-17 LAB — URINE CULTURE

## 2020-07-18 ENCOUNTER — Telehealth: Payer: Self-pay

## 2020-07-18 NOTE — Telephone Encounter (Signed)
Called pt and discussed recent ER visit. Made appt for tomorrow for urine reck and ER follow up

## 2020-07-19 ENCOUNTER — Ambulatory Visit (INDEPENDENT_AMBULATORY_CARE_PROVIDER_SITE_OTHER): Payer: Medicare Other

## 2020-07-19 ENCOUNTER — Encounter: Payer: Self-pay | Admitting: Nurse Practitioner

## 2020-07-19 ENCOUNTER — Other Ambulatory Visit: Payer: Self-pay

## 2020-07-19 ENCOUNTER — Ambulatory Visit (INDEPENDENT_AMBULATORY_CARE_PROVIDER_SITE_OTHER): Payer: Medicare Other | Admitting: Nurse Practitioner

## 2020-07-19 VITALS — BP 123/78 | HR 91 | Temp 97.0°F | Resp 20 | Ht 69.0 in | Wt 169.0 lb

## 2020-07-19 DIAGNOSIS — K5903 Drug induced constipation: Secondary | ICD-10-CM

## 2020-07-19 DIAGNOSIS — K5900 Constipation, unspecified: Secondary | ICD-10-CM | POA: Diagnosis not present

## 2020-07-19 DIAGNOSIS — Z87440 Personal history of urinary (tract) infections: Secondary | ICD-10-CM | POA: Diagnosis not present

## 2020-07-19 DIAGNOSIS — Z09 Encounter for follow-up examination after completed treatment for conditions other than malignant neoplasm: Secondary | ICD-10-CM | POA: Diagnosis not present

## 2020-07-19 MED ORDER — HYDROCORTISONE (PERIANAL) 2.5 % EX CREA
1.0000 | TOPICAL_CREAM | Freq: Two times a day (BID) | CUTANEOUS | 5 refills | Status: DC
Start: 2020-07-19 — End: 2021-09-25

## 2020-07-19 NOTE — Patient Instructions (Signed)
Constipation, Adult Constipation is when a person has trouble pooping (having a bowel movement). When you have this condition, you may poop fewer than 3 times a week. Your poop (stool) may also be dry, hard, or bigger than normal. Follow these instructions at home: Eating and drinking  Eat foods that have a lot of fiber, such as: ? Fresh fruits and vegetables. ? Whole grains. ? Beans.  Eat less of foods that are low in fiber and high in fat and sugar, such as: ? [place] fries. ? Hamburgers. ? Cookies. ? Candy. ? Soda.  Drink enough fluid to keep your pee (urine) pale yellow.   General instructions  Exercise regularly or as told by your doctor. Try to do 150 minutes of exercise each week.  Go to the restroom when you feel like you need to poop. Do not hold it in.  Take over-the-counter and prescription medicines only as told by your doctor. These include any fiber supplements.  When you poop: ? Do deep breathing while relaxing your lower belly (abdomen). ? Relax your pelvic floor. The pelvic floor is a group of muscles that support the rectum, bladder, and intestines (as well as the uterus in women).  Watch your condition for any changes. Tell your doctor if you notice any.  Keep all follow-up visits as told by your doctor. This is important. Contact a doctor if:  You have pain that gets worse.  You have a fever.  You have not pooped for 4 days.  You vomit.  You are not hungry.  You lose weight.  You are bleeding from the opening of the butt (anus).  You have thin, pencil-like poop. Get help right away if:  You have a fever, and your symptoms suddenly get worse.  You leak poop or have blood in your poop.  Your belly feels hard or bigger than normal (bloated).  You have very bad belly pain.  You feel dizzy or you faint. Summary  Constipation is when a person poops fewer than 3 times a week, has trouble pooping, or has poop that is dry, hard, or bigger than  normal.  Eat foods that have a lot of fiber.  Drink enough fluid to keep your pee (urine) pale yellow.  Take over-the-counter and prescription medicines only as told by your doctor. These include any fiber supplements. This information is not intended to replace advice given to you by your health care provider. Make sure you discuss any questions you have with your health care provider. Document Revised: 01/25/2019 Document Reviewed: 01/25/2019 Elsevier Patient Education  [place].

## 2020-07-19 NOTE — Progress Notes (Signed)
   Subjective:    Patient ID: [name], female    DOB: 10/23/1940, 80 y.o.   MRN: [id number]   Chief Complaint: Hospitalization Follow-up   HPI Patient went to the hospital on 07/15/20 and was dx with drug induced constipation with fecal impaction. They were not able to remove impaction in ED nor could she tolerate an enema. She was sent home on miralax. She also had slight UTI that was treated with fosfomycin 1x dose. She has had several bowel movements since she went home nad only goes a little at a time. Still having some abdominal pain.    Review of Systems  Constitutional: Negative for diaphoresis.  Eyes: Negative for pain.  Respiratory: Negative for shortness of breath.   Cardiovascular: Negative for chest pain, palpitations and leg swelling.  Gastrointestinal: Negative for abdominal pain.  Endocrine: Negative for polydipsia.  Skin: Negative for rash.  Neurological: Negative for dizziness, weakness and headaches.  Hematological: Does not bruise/bleed easily.  All other systems reviewed and are negative.      Objective:   Physical Exam Vitals and nursing note reviewed.  Constitutional:      Appearance: Normal appearance.  Cardiovascular:     Rate and Rhythm: Normal rate and regular rhythm.     Heart sounds: Normal heart sounds.  Pulmonary:     Effort: Pulmonary effort is normal.     Breath sounds: Normal breath sounds.  Abdominal:     General: Bowel sounds are normal.     Palpations: Abdomen is soft.     Tenderness: There is abdominal tenderness. There is guarding (not unusual for her).  Skin:    General: Skin is warm.  Neurological:     General: No focal deficit present.     Mental Status: She is alert and oriented to person, place, and time.  Psychiatric:        Mood and Affect: Mood normal.        Behavior: Behavior normal.     BP 123/78   Pulse 91   Temp (!) 97 F (36.1 C) (Temporal)   Resp 20   Ht 5' 9"  (1.753 m)   Wt 169 lb (76.7 kg)   SpO2 94%    BMI 24.96 kg/m   KUB- constipation-Preliminary reading by Ronnald Collum, FNP  [facility]      Assessment & Plan:  [name] in today with chief complaint of Hospitalization Follow-up   1. Hx: UTI (urinary tract infection) Patient inable to void while I office - Urinalysis  2. Drug-induced constipation miralax daily in apple juice linzess 70mg 1 po daily- samples - DG Abd 1 View   3. Hospital follow up Hospital records reviewed   The above assessment and management plan was discussed with the patient. The patient verbalized understanding of and has agreed to the management plan. Patient is aware to call the clinic if symptoms persist or worsen. Patient is aware when to return to the clinic for a follow-up visit. Patient educated on when it is appropriate to go to the emergency department.   Mary-Margaret MHassell Done FNP

## 2020-07-22 ENCOUNTER — Telehealth: Payer: Self-pay

## 2020-07-23 ENCOUNTER — Other Ambulatory Visit: Payer: Medicare Other

## 2020-07-23 DIAGNOSIS — Z87440 Personal history of urinary (tract) infections: Secondary | ICD-10-CM | POA: Diagnosis not present

## 2020-07-23 LAB — URINALYSIS
Bilirubin, UA: NEGATIVE
Glucose, UA: NEGATIVE
Ketones, UA: NEGATIVE
Nitrite, UA: NEGATIVE
Protein,UA: NEGATIVE
RBC, UA: NEGATIVE
Specific Gravity, UA: 1.015 (ref 1.005–1.030)
Urobilinogen, Ur: 0.2 mg/dL (ref 0.2–1.0)
pH, UA: 5.5 (ref 5.0–7.5)

## 2020-07-26 ENCOUNTER — Telehealth: Payer: Self-pay

## 2020-07-26 NOTE — Telephone Encounter (Signed)
Patient notified and verbalized understanding. 

## 2020-07-30 ENCOUNTER — Encounter: Payer: Self-pay | Admitting: Nurse Practitioner

## 2020-07-30 ENCOUNTER — Other Ambulatory Visit: Payer: Self-pay

## 2020-07-30 ENCOUNTER — Ambulatory Visit (INDEPENDENT_AMBULATORY_CARE_PROVIDER_SITE_OTHER): Payer: Medicare Other | Admitting: Nurse Practitioner

## 2020-07-30 VITALS — BP 136/85 | HR 90 | Temp 97.7°F | Resp 20 | Ht 69.0 in | Wt 167.0 lb

## 2020-07-30 DIAGNOSIS — E119 Type 2 diabetes mellitus without complications: Secondary | ICD-10-CM

## 2020-07-30 LAB — BAYER DCA HB A1C WAIVED: HB A1C (BAYER DCA - WAIVED): 9.4 % — ABNORMAL HIGH (ref <7.0)

## 2020-07-30 MED ORDER — JANUMET 50-500 MG PO TABS
1.0000 | ORAL_TABLET | Freq: Two times a day (BID) | ORAL | 1 refills | Status: DC
Start: 2020-07-30 — End: 2020-09-30

## 2020-07-30 NOTE — Patient Instructions (Signed)

## 2020-07-30 NOTE — Progress Notes (Signed)
   Subjective:    Patient ID: [name], female    DOB: 01/08/1941, 80 y.o.   MRN: [id number]   Chief Complaint: Diabetes   HPI Patient was seen for follow up on 07/02/20. At last visit we stopped jardiance due to dizziness. We started her on janumet BID. Her blood sugars are averaging around 150 in the mornings. Lab Results  Component Value Date   HGBA1C 11.1 (H) 07/02/2020     Review of Systems  Constitutional: Negative for diaphoresis.  Eyes: Negative for pain.  Respiratory: Negative for shortness of breath.   Cardiovascular: Negative for chest pain, palpitations and leg swelling.  Gastrointestinal: Negative for abdominal pain.  Endocrine: Negative for polydipsia.  Skin: Negative for rash.  Neurological: Negative for dizziness, weakness and headaches.  Hematological: Does not bruise/bleed easily.  All other systems reviewed and are negative.      Objective:   Physical Exam Vitals and nursing note reviewed.  Constitutional:      Appearance: Normal appearance.  Cardiovascular:     Rate and Rhythm: Normal rate and regular rhythm.     Heart sounds: Normal heart sounds.  Pulmonary:     Effort: Pulmonary effort is normal.     Breath sounds: Normal breath sounds.  Skin:    General: Skin is warm.  Neurological:     General: No focal deficit present.     Mental Status: She is alert and oriented to person, place, and time.  Psychiatric:        Mood and Affect: Mood normal.        Behavior: Behavior normal.    BP 136/85   Pulse 90   Temp 97.7 F (36.5 C) (Temporal)   Resp 20   Ht 5' 9"  (1.753 m)   Wt 167 lb (75.8 kg)   SpO2 96%   BMI 24.66 kg/m   HGBA1c 9.4% down from 11.1%jan      Assessment & Plan:  [name] in today with chief complaint of Diabetes   1. Type 2 diabetes mellitus without complication, without long-term current use of insulin (HCC) Carb counting Continue current meds Recheck in 2 months - Bayer DCA Hb A1c Waived -  sitaGLIPtin-metformin (JANUMET) 50-500 MG tablet; Take 1 tablet by mouth 2 (two) times daily with a meal.  Dispense: 180 tablet; Refill: 1    The above assessment and management plan was discussed with the patient. The patient verbalized understanding of and has agreed to the management plan. Patient is aware to call the clinic if symptoms persist or worsen. Patient is aware when to return to the clinic for a follow-up visit. Patient educated on when it is appropriate to go to the emergency department.   Mary-Margaret Hassell Done, FNP

## 2020-09-09 ENCOUNTER — Other Ambulatory Visit: Payer: Self-pay | Admitting: Nurse Practitioner

## 2020-09-10 ENCOUNTER — Telehealth: Payer: Self-pay | Admitting: Nurse Practitioner

## 2020-09-10 DIAGNOSIS — M797 Fibromyalgia: Secondary | ICD-10-CM

## 2020-09-10 MED ORDER — OXYCODONE HCL 5 MG PO TABS: 5.0000 mg | ORAL_TABLET | Freq: Two times a day (BID) | ORAL | 0 refills | Status: DC

## 2020-09-10 NOTE — Telephone Encounter (Signed)
  Prescription Request  09/10/2020  What is the name of the medication or equipment? oxycodone  Have you contacted your pharmacy to request a refill? (if applicable) yes  Which pharmacy would you like this sent to? [facility]    Patient notified that their request is being sent to the clinical staff for review and that they should receive a response within 2 business days.

## 2020-09-10 NOTE — Telephone Encounter (Signed)
Patient aware.

## 2020-09-10 NOTE — Telephone Encounter (Signed)
Pt called pharmacy and they said that her oxycodone was denied because she needed an appt. She already had an appt on 09/27/20 and I was able to get her in sooner, putting her in on 6/27. Will MMM be able to call in her medicine until then or will she have to wait until her appt? She said that she usually comes every 6 months and the appt for 7/8 was made by MMM when she came in on 5/10. Please call back and advise.

## 2020-09-16 ENCOUNTER — Ambulatory Visit: Payer: Medicare Other | Admitting: Nurse Practitioner

## 2020-09-16 ENCOUNTER — Telehealth: Payer: Self-pay | Admitting: Nurse Practitioner

## 2020-09-16 MED ORDER — GLUCOSE BLOOD VI STRP
ORAL_STRIP | 12 refills | Status: DC
Start: 2020-09-16 — End: 2021-09-25

## 2020-09-16 NOTE — Telephone Encounter (Signed)
Rx sent to pharmacy and patient aware.  

## 2020-09-16 NOTE — Telephone Encounter (Signed)
  Prescription Request  09/16/2020  What is the name of the medication or equipment? One touch verio test strips  Have you contacted your pharmacy to request a refill? (if applicable) no  Which pharmacy would you like this sent to? [facility]   Patient notified that their request is being sent to the clinical staff for review and that they should receive a response within 2 business days.

## 2020-09-17 ENCOUNTER — Telehealth: Payer: Self-pay | Admitting: Nurse Practitioner

## 2020-09-17 NOTE — Telephone Encounter (Signed)
  Prescription Request  09/17/2020  What is the name of the medication or equipment? Test Strips for One Touch  Have you contacted your pharmacy to request a refill? (if applicable) yes  Which pharmacy would you like this sent to? [facility]   Patient notified that their request is being sent to the clinical staff for review and that they should receive a response within 2 business days.    MMM's pt.  Pt has 1 left, so please call today!  She called yesterday, too!

## 2020-09-17 NOTE — Telephone Encounter (Signed)
Pt aware [name] has them out for delivery for her

## 2020-09-27 ENCOUNTER — Encounter: Payer: Self-pay | Admitting: *Deleted

## 2020-09-27 ENCOUNTER — Ambulatory Visit (INDEPENDENT_AMBULATORY_CARE_PROVIDER_SITE_OTHER): Payer: Medicare Other | Admitting: *Deleted

## 2020-09-27 ENCOUNTER — Ambulatory Visit: Payer: Self-pay | Admitting: Nurse Practitioner

## 2020-09-27 DIAGNOSIS — Z0000 Encounter for general adult medical examination without abnormal findings: Secondary | ICD-10-CM

## 2020-09-27 NOTE — Progress Notes (Signed)
MEDICARE ANNUAL WELLNESS VISIT  09/27/2020  Telephone Visit Disclaimer This Medicare AWV was conducted by telephone due to national recommendations for restrictions regarding the COVID-19 Pandemic (e.g. social distancing).  I verified, using two identifiers, that I am speaking with [name] or their authorized healthcare agent. I discussed the limitations, risks, security, and privacy concerns of performing an evaluation and management service by telephone and the potential availability of an in-person appointment in the future. The patient expressed understanding and agreed to proceed.  Location of Patient: home Location of Provider (nurse):  [facility]  Subjective:    [name] is a 80 y.o. female patient of [name], [place] who had a Medicare Annual Wellness Visit today via telephone. [name] is Retired and lives alone. she has 3 step children. she reports that she is socially active and does interact with friends/family regularly. she is moderately physically active and enjoys dancing and gardening.  Patient Care Team: [name], FNP as PCP - General (Nurse Practitioner)  Advanced Directives 09/27/2020 07/15/2020 09/27/2019 08/25/2018 10/13/2017 09/03/2017 07/28/2017  Does Patient Have a Medical Advance Directive? Yes No Yes Yes No;Yes Yes Yes  Type of Advance Directive Living will - [place];Living will Living will;Healthcare Power of [place];Living will Healthcare Power of [place];Living will  Does patient want to make changes to medical advance directive? No - Patient declined - No - Patient declined - No - Patient declined - -  Copy of [place] in Chart? - - No - copy requested No - copy requested No - copy requested No - copy requested No - copy requested  Would patient like information on creating a medical advance directive? - No - Patient  declined - - No - Patient declined - No - Patient declined  Pre-existing out of facility DNR order (yellow form or pink MOST form) - - - - - - -    Hospital Utilization Over the Past 12 Months: # of hospitalizations or ER visits: 0 # of surgeries: 0  Review of Systems    Patient reports that her overall health is unchanged compared to last year.  History obtained from the patient  Patient Reported Readings (BP, Pulse, CBG, Weight, etc) none  Pain Assessment Pain : 0-10 (head hurts and stomach pain at times- feels like it is from diabetic meds) Pain Score: 9  Pain Type: Chronic pain (uses Tylenol- and pain varies) Pain Location: Head Pain Orientation:  (dizzy- doesnt drive when having pain) Pain Descriptors / Indicators: Headache Pain Onset: Other (comment) (comes and goes) Pain Frequency: Occasional Pain Relieving Factors: tylenol and flonase Effect of Pain on Daily Activities: doesnt drive or do much when having pain  Pain Relieving Factors: tylenol and flonase  Current Medications & Allergies (verified) Allergies as of 09/27/2020       Reactions   Azathioprine Nausea And Vomiting, Other (See Comments)   SEVERE NAUSEA AND VOMITING WITH CHEST PAIN-  PATIENT INSISTS NEVER TO BE GIVEN ANYTHING SIMILAR TO THIS   Tape Other (See Comments), Rash   BLISTERS AND SKIN TEARING   Codeine Nausea And Vomiting   Lac Bovis Nausea And Vomiting   Penicillins Hives   Has patient had a PCN reaction causing immediate rash, facial/tongue/throat swelling, SOB or lightheadedness with hypotension: No Has patient had a PCN reaction causing severe rash involving mucus membranes or skin necrosis: No Has patient had a PCN reaction that  required hospitalization: No Has patient had a PCN reaction occurring within the last 10 years: No If all of the above answers are "NO", then may proceed with Cephalosporin use.   Povidone-iodine Hives   BETADINE   Procaine Hcl Other (See Comments)   SEVERE GI  UPSET (NAUSEA,VOMITING AND DIARRHEA)   Sulfonamide Derivatives Hives   Tramadol Itching   Acyclovir And Related    Azithromycin    Mycins    Clonazepam Other (See Comments)   Skin burning, insomnia, diarrhea.   Dairy Aid [lactase]    Doxycycline Rash   Latex Rash   Niacin And Related Rash        Medication List        Accurate as of September 27, 2020  1:41 PM. If you have any questions, ask your nurse or doctor.          amLODipine 2.5 MG tablet Commonly known as: NORVASC Take 1 tablet (2.5 mg total) by mouth daily.   antiseptic oral rinse Liqd 1 application by Mouth Rinse route daily. FOR DRY MOUTH   benazepril-hydrochlorthiazide 20-12.5 MG tablet Commonly known as: LOTENSIN HCT Take 2 tablets by mouth daily. TAKE (2) TABLETS  BY MOUTH ONCE DAILY.   blood glucose meter kit and supplies Dispense based on patient and insurance preference. Use up to four times daily as directed. (FOR ICD-10 E10.9, E11.9).   blood glucose meter kit and supplies Dispense based on patient and insurance preference. Use up to four times daily as directed. (FOR ICD-10 E10.9, E11.9).   busPIRone 10 MG tablet Commonly known as: BUSPAR Take 1 tablet (10 mg total) by mouth 3 (three) times daily.   co-enzyme Q-10 50 MG capsule Take 50 mg by mouth once a week.   dicyclomine 20 MG tablet Commonly known as: BENTYL Take 1/2-1 tablets (10-20 mg total) by mouth daily.   escitalopram 20 MG tablet Commonly known as: LEXAPRO Take 1 tablet (20 mg total) by mouth daily.   Fish Oil 1000 MG Cpdr Take 3 capsules by mouth daily.   glucose blood test strip Check blood sugar daily and as needed   hydrocortisone 2.5 % rectal cream Commonly known as: Anusol-HC Place 1 application rectally 2 (two) times daily.   IMODIUM PO Take 1 tablet by mouth daily as needed (diarrhea).   Janumet 50-500 MG tablet Generic drug: sitaGLIPtin-metformin Take 1 tablet by mouth 2 (two) times daily with a meal.    methocarbamol 500 MG tablet Commonly known as: ROBAXIN Take 1 tablet (500 mg total) by mouth every 6 (six) hours as needed for muscle spasms.   ondansetron 4 MG tablet Commonly known as: ZOFRAN Take 1 tablet (4 mg total) by mouth every 8 (eight) hours as needed for nausea or vomiting.   oxyCODONE 5 MG immediate release tablet Commonly known as: Roxicodone Take 1 tablet (5 mg total) by mouth 2 (two) times daily.   oxyCODONE 5 MG immediate release tablet Commonly known as: Roxicodone Take 1 tablet (5 mg total) by mouth 2 (two) times daily.   oxyCODONE 5 MG immediate release tablet Commonly known as: Roxicodone Take 1 tablet (5 mg total) by mouth 2 (two) times daily.   polyethylene glycol 17 g packet Commonly known as: MIRALAX / GLYCOLAX Take 1 packet daily with 8 ounces of water as needed for constipation.   PROBIOTIC FORMULA PO Take 1-2 capsules by mouth every morning.   QUEtiapine 100 MG tablet Commonly known as: SEROquel Take 1 tablet (100 mg total)  by mouth at bedtime.   rosuvastatin 10 MG tablet Commonly known as: CRESTOR Take 1 tablet (10 mg total) by mouth daily.   shark liver oil-cocoa butter 0.25-3-85.5 % suppository Commonly known as: PREPARATION H Place 1 suppository rectally as needed for hemorrhoids.   vitamin E 180 MG (400 UNITS) capsule Take 400 Units by mouth every morning.   WOMENS MULTI VITAMIN & MINERAL PO Take 1 tablet by mouth every other day.        History (reviewed): Past Medical History:  Diagnosis Date   Anemia due to blood loss 05/02/2011   Anxiety    Arthritis    rheumatoid    Collagen vascular disease ([place])    ra   Diabetes mellitus without complication ([place])    Fibromyalgia    Hyperglycemia, drug-induced 05/02/2011   Hypertension    Ulcerative colitis    Past Surgical History:  Procedure Laterality Date   ABDOMINAL HYSTERECTOMY     BIOPSY N/A 12/28/2014   Procedure: SIGMOID AND RECTAL BIOPSIES;  Surgeon: Rogene Houston, MD;   Location: [facility];  Service: Endoscopy;  Laterality: N/A;   CHOLECYSTECTOMY     s/p   COLONOSCOPY  06/16/06. 04/27/2007   proctitis seen, biopsies showed chronic active colitis, no dysplasia    EYE SURGERY     clear lense extraction 1998    FLEXIBLE SIGMOIDOSCOPY N/A 12/28/2014   Procedure: FLEXIBLE SIGMOIDOSCOPY WITH PROPOFOL;  Surgeon: Rogene Houston, MD;  Location: [facility];  Service: Endoscopy;  Laterality: N/A;   Family History  Problem Relation Age of Onset   Heart attack Mother 69       Died in her sleep   Diabetes Mother    Heart attack Father 24       Died in her sleep   Heart attack Brother 47       Defib, CABG   Diabetes Brother    Kidney disease Brother        dyalasis    Cancer Sister        liver, breast    Heart disease Sister    Social History   Socioeconomic History   Marital status: Widowed    Spouse name: Not on file   Number of children: 0   Years of education: 12   Highest education level: High school graduate  Occupational History   Occupation: retired   Tobacco Use   Smoking status: Never   Smokeless tobacco: Never  Vaping Use   Vaping Use: Never used  Substance and Sexual Activity   Alcohol use: Never    Alcohol/week: 0.0 standard drinks    Comment: Ocassionally a small glass of wine. Patient states that is has been 2 months since she drank anything.   Drug use: No   Sexual activity: Not Currently  Other Topics Concern   Not on file  Social History Narrative   Lives alone.  3  step children living    Social Determinants of Health   Financial Resource Strain: Not on file  Food Insecurity: Not on file  Transportation Needs: Not on file  Physical Activity: Not on file  Stress: Not on file  Social Connections: Not on file    Activities of Daily Living In your present state of health, do you have any difficulty performing the following activities: 09/27/2020  Hearing? N  Vision? Y  Comment has lenses in eyes since 24- has upcoming appt  with eye doctor  Difficulty concentrating or making decisions? N  Walking or climbing stairs? Y  Comment only when dizzy  Dressing or bathing? N  Doing errands, shopping? N  Preparing Food and eating ? N  Using the Toilet? N  In the past six months, have you accidently leaked urine? N  Do you have problems with loss of bowel control? Y  Comment when has stomach virus or esat something that upsets stomach  Managing your Medications? N  Managing your Finances? N  Housekeeping or managing your Housekeeping? N  Comment someone helps with house once a month  Some recent data might be hidden    Patient Education/ Literacy How often do you need to have someone help you when you read instructions, pamphlets, or other written materials from your doctor or pharmacy?: 1 - Never What is the last grade level you completed in school?: 3 years of college  Exercise Current Exercise Habits: The patient does not participate in regular exercise at present (dances occasionally), Exercise limited by: Other - see comments (ocassional headaches and stomach pain)  Diet Patient reports consuming  2-3  meals a day and 2-3 snack(s) a day Patient reports that her primary diet is: Diabetic Patient reports that she does have regular access to food.   Depression Screen PHQ 2/9 Scores 09/27/2020 07/30/2020 07/19/2020 05/27/2020 02/26/2020 10/24/2019 09/27/2019  PHQ - 2 Score 0 0 0 2 1 0 0  PHQ- 9 Score - - - 5 4 0 -     Fall Risk Fall Risk  09/27/2020 07/30/2020 07/19/2020 07/02/2020 05/27/2020  Falls in the past year? 1 0 0 1 0  Number falls in past yr: 0 - - 1 -  Injury with Fall? 0 - - 0 -  Risk for fall due to : Other (Comment) - - History of fall(s) -  Risk for fall due to: Comment happend when sick with stomach virus - - - -  Follow up Falls evaluation completed - - Education provided -     Objective:  [name] seemed alert and oriented and she participated appropriately during our telephone visit.  Blood  Pressure Weight BMI  BP Readings from Last 3 Encounters:  07/30/20 136/85  07/19/20 123/78  07/16/20 (!) 148/94   Wt Readings from Last 3 Encounters:  07/30/20 167 lb (75.8 kg)  07/19/20 169 lb (76.7 kg)  07/15/20 165 lb 5.5 oz (75 kg)   BMI Readings from Last 1 Encounters:  07/30/20 24.66 kg/m    *Unable to obtain current vital signs, weight, and BMI due to telephone visit type  Hearing/Vision  Vaughan Basta did not seem to have difficulty with hearing/understanding during the telephone conversation Reports that she has had a formal eye exam by an eye care professional within the past year Reports that she has not had a formal hearing evaluation within the past year *Unable to fully assess hearing and vision during telephone visit type  Cognitive Function: 6CIT Screen 09/27/2020 09/27/2019 08/25/2018  What Year? 0 points 0 points 0 points  What month? 0 points 0 points 0 points  What time? 0 points 0 points 0 points  Count back from 20 0 points 0 points -  Months in reverse 0 points 0 points -  Repeat phrase 0 points 2 points 4 points  Total Score 0 2 -   (Normal:0-7, Significant for Dysfunction: >8)  Normal Cognitive Function Screening: Yes   Immunization & Health Maintenance Record Immunization History  Administered Date(s) Administered   Hepatitis B 09/13/1990, 10/14/1990, 03/29/1991   Influenza, High  Dose Seasonal PF 12/18/2016   Influenza,inj,Quad PF,6+ Mos 01/04/2015, 02/06/2016   Influenza,inj,quad, With Preservative 01/17/2014   Influenza-Unspecified 12/24/2009   Moderna Sars-Covid-2 Vaccination 11/22/2019, 01/23/2020   PPD Test 03/13/2013   Pneumococcal Conjugate-13 01/29/2015   Pneumococcal Polysaccharide-23 03/24/2011   Tdap 10/20/2012    Health Maintenance  Topic Date Due   Zoster Vaccines- Shingrix (1 of 2) Never done   OPHTHALMOLOGY EXAM  02/17/2014   COVID-19 Vaccine (3 - Booster for Moderna series) 06/22/2020   Hepatitis C Screening  10/23/2020 (Originally  10/11/1958)   INFLUENZA VACCINE  10/21/2020   HEMOGLOBIN A1C  01/30/2021   FOOT EXAM  02/25/2021   MAMMOGRAM  04/03/2021   TETANUS/TDAP  10/21/2022   DEXA SCAN  Completed   PNA vac Low Risk Adult  Completed   HPV VACCINES  Aged Out       Assessment  This is a routine wellness examination for [employer].  Health Maintenance: Due or Overdue Health Maintenance Due  Topic Date Due   Zoster Vaccines- Shingrix (1 of 2) Never done   OPHTHALMOLOGY EXAM  02/17/2014   COVID-19 Vaccine (3 - Booster for Moderna series) 06/22/2020    [name] does not need a referral for Community Assistance: Care Management:   no Social Work:    no Prescription Assistance:  no Nutrition/Diabetes Education:  no   Plan:  Personalized Goals  Goals Addressed             This Visit's Progress    Patient Stated       09/27/2020 AWV Goal: Exercise for General Health  Patient will verbalize understanding of the benefits of increased physical activity: Exercising regularly is important. It will improve your overall fitness, flexibility, and endurance. Regular exercise also will improve your overall health. It can help you control your weight, reduce stress, and improve your bone density. Over the next year, patient will increase physical activity as tolerated with a goal of at least 150 minutes of moderate physical activity per week.  You can tell that you are exercising at a moderate intensity if your heart starts beating faster and you start breathing faster but can still hold a conversation. Moderate-intensity exercise ideas include: Walking 1 mile (1.6 km) in about 15 minutes Biking Hiking Golfing Dancing Water aerobics Patient will verbalize understanding of everyday activities that increase physical activity by providing examples like the following: Yard work, such as: Sales promotion account executive Gardening Washing  windows or floors Patient will be able to explain general safety guidelines for exercising:  Before you start a new exercise program, talk with your health care provider. Do not exercise so much that you hurt yourself, feel dizzy, or get very short of breath. Wear comfortable clothes and wear shoes with good support. Drink plenty of water while you exercise to prevent dehydration or heat stroke. Work out until your breathing and your heartbeat get faster.         Personalized Health Maintenance & Screening Recommendations  Discuss shingles vaccine with provider at upcoming appointment  Lung Cancer Screening Recommended: no (Low Dose CT Chest recommended if Age 61-80 years, 30 pack-year currently smoking OR have quit w/in past 15 years) Hepatitis C Screening recommended: no HIV Screening recommended: no  Advanced Directives: Written information was not prepared per patient's request.  Referrals & Orders No orders of the defined types were placed in this encounter.   Follow-up Plan Follow-up  with Hassell Done, Mary-Margaret, FNP as planned    I have personally reviewed and noted the following in the patient's chart:   Medical and social history Use of alcohol, tobacco or illicit drugs  Current medications and supplements Functional ability and status Nutritional status Physical activity Advanced directives List of other physicians Hospitalizations, surgeries, and ER visits in previous 12 months Vitals Screenings to include cognitive, depression, and falls Referrals and appointments  In addition, I have reviewed and discussed with [name] certain preventive protocols, quality metrics, and best practice recommendations. A written personalized care plan for preventive services as well as general preventive health recommendations is available and can be mailed to the patient at her request.      Baldomero Lamy LPN  10/23/3381

## 2020-09-30 ENCOUNTER — Ambulatory Visit (INDEPENDENT_AMBULATORY_CARE_PROVIDER_SITE_OTHER): Payer: Medicare Other | Admitting: Nurse Practitioner

## 2020-09-30 ENCOUNTER — Encounter: Payer: Self-pay | Admitting: Nurse Practitioner

## 2020-09-30 ENCOUNTER — Other Ambulatory Visit: Payer: Self-pay

## 2020-09-30 VITALS — BP 111/70 | HR 94 | Temp 97.1°F | Resp 20 | Ht 69.0 in | Wt 165.0 lb

## 2020-09-30 DIAGNOSIS — I10 Essential (primary) hypertension: Secondary | ICD-10-CM

## 2020-09-30 DIAGNOSIS — N1832 Chronic kidney disease, stage 3b: Secondary | ICD-10-CM

## 2020-09-30 DIAGNOSIS — E119 Type 2 diabetes mellitus without complications: Secondary | ICD-10-CM | POA: Diagnosis not present

## 2020-09-30 DIAGNOSIS — F5101 Primary insomnia: Secondary | ICD-10-CM

## 2020-09-30 DIAGNOSIS — M797 Fibromyalgia: Secondary | ICD-10-CM

## 2020-09-30 DIAGNOSIS — E785 Hyperlipidemia, unspecified: Secondary | ICD-10-CM | POA: Diagnosis not present

## 2020-09-30 DIAGNOSIS — F3342 Major depressive disorder, recurrent, in full remission: Secondary | ICD-10-CM

## 2020-09-30 DIAGNOSIS — K5150 Left sided colitis without complications: Secondary | ICD-10-CM

## 2020-09-30 LAB — BAYER DCA HB A1C WAIVED: HB A1C (BAYER DCA - WAIVED): 7.3 % — ABNORMAL HIGH (ref <7.0)

## 2020-09-30 MED ORDER — JANUMET 50-500 MG PO TABS
1.0000 | ORAL_TABLET | Freq: Two times a day (BID) | ORAL | 1 refills | Status: DC
Start: 2020-09-30 — End: 2021-01-10

## 2020-09-30 MED ORDER — ROSUVASTATIN CALCIUM 10 MG PO TABS: 10.0000 mg | ORAL_TABLET | Freq: Every day | ORAL | 1 refills | Status: DC

## 2020-09-30 MED ORDER — ESCITALOPRAM OXALATE 20 MG PO TABS: 20.0000 mg | ORAL_TABLET | Freq: Every day | ORAL | 1 refills | Status: DC

## 2020-09-30 MED ORDER — BENAZEPRIL-HYDROCHLOROTHIAZIDE 20-12.5 MG PO TABS: 2.0000 | ORAL_TABLET | Freq: Every day | ORAL | 3 refills | Status: DC

## 2020-09-30 MED ORDER — OXYCODONE HCL 5 MG PO TABS: 5.0000 mg | ORAL_TABLET | Freq: Two times a day (BID) | ORAL | 0 refills | Status: DC

## 2020-09-30 MED ORDER — METHOCARBAMOL 500 MG PO TABS: 500.0000 mg | ORAL_TABLET | Freq: Four times a day (QID) | ORAL | 1 refills | Status: DC | PRN

## 2020-09-30 MED ORDER — ONDANSETRON HCL 4 MG PO TABS: 4.0000 mg | ORAL_TABLET | Freq: Three times a day (TID) | ORAL | 2 refills | Status: DC | PRN

## 2020-09-30 MED ORDER — TRAZODONE HCL 50 MG PO TABS: 25.0000 mg | ORAL_TABLET | Freq: Every evening | ORAL | 3 refills | Status: DC | PRN

## 2020-09-30 MED ORDER — AMLODIPINE BESYLATE 2.5 MG PO TABS: 2.5000 mg | ORAL_TABLET | Freq: Every day | ORAL | 1 refills | Status: DC

## 2020-09-30 MED ORDER — BUSPIRONE HCL 10 MG PO TABS: 10.0000 mg | ORAL_TABLET | Freq: Three times a day (TID) | ORAL | 2 refills | Status: DC

## 2020-09-30 NOTE — Patient Instructions (Signed)
Insomnia Insomnia is a sleep disorder that makes it difficult to fall asleep or stay asleep. Insomnia can cause fatigue, low energy, difficulty concentrating, moodswings, and poor performance at work or school. There are three different ways to classify insomnia: Difficulty falling asleep. Difficulty staying asleep. Waking up too early in the morning. Any type of insomnia can be long-term (chronic) or short-term (acute). Both are common. Short-term insomnia usually lasts for three months or less. Chronic insomnia occurs at least three times a week for longer than threemonths. What are the causes? Insomnia may be caused by another condition, situation, or substance, such as: Anxiety. Certain medicines. Gastroesophageal reflux disease (GERD) or other gastrointestinal conditions. Asthma or other breathing conditions. Restless legs syndrome, sleep apnea, or other sleep disorders. Chronic pain. Menopause. Stroke. Abuse of alcohol, tobacco, or illegal drugs. Mental health conditions, such as depression. Caffeine. Neurological disorders, such as Alzheimer's disease. An overactive thyroid (hyperthyroidism). Sometimes, the cause of insomnia may not be known. What increases the risk? Risk factors for insomnia include: Gender. Women are affected more often than men. Age. Insomnia is more common as you get older. Stress. Lack of exercise. Irregular work schedule or working night shifts. Traveling between different time zones. Certain medical and mental health conditions. What are the signs or symptoms? If you have insomnia, the main symptom is having trouble falling asleep or having trouble staying asleep. This may lead to other symptoms, such as: Feeling fatigued or having low energy. Feeling nervous about going to sleep. Not feeling rested in the morning. Having trouble concentrating. Feeling irritable, anxious, or depressed. How is this diagnosed? This condition may be diagnosed based  on: Your symptoms and medical history. Your health care provider may ask about: Your sleep habits. Any medical conditions you have. Your mental health. A physical exam. How is this treated? Treatment for insomnia depends on the cause. Treatment may focus on treating an underlying condition that is causing insomnia. Treatment may also include: Medicines to help you sleep. Counseling or therapy. Lifestyle adjustments to help you sleep better. Follow these instructions at home: Eating and drinking  Limit or avoid alcohol, caffeinated beverages, and cigarettes, especially close to bedtime. These can disrupt your sleep. Do not eat a large meal or eat spicy foods right before bedtime. This can lead to digestive discomfort that can make it hard for you to sleep.  Sleep habits  Keep a sleep diary to help you and your health care provider figure out what could be causing your insomnia. Write down: When you sleep. When you wake up during the night. How well you sleep. How rested you feel the next day. Any side effects of medicines you are taking. What you eat and drink. Make your bedroom a dark, comfortable place where it is easy to fall asleep. Put up shades or blackout curtains to block light from outside. Use a white noise machine to block noise. Keep the temperature cool. Limit screen use before bedtime. This includes: Watching TV. Using your smartphone, tablet, or computer. Stick to a routine that includes going to bed and waking up at the same times every day and night. This can help you fall asleep faster. Consider making a quiet activity, such as reading, part of your nighttime routine. Try to avoid taking naps during the day so that you sleep better at night. Get out of bed if you are still awake after 15 minutes of trying to sleep. Keep the lights down, but try reading or doing a quiet  activity. When you feel sleepy, go back to bed.  General instructions Take over-the-counter  and prescription medicines only as told by your health care provider. Exercise regularly, as told by your health care provider. Avoid exercise starting several hours before bedtime. Use relaxation techniques to manage stress. Ask your health care provider to suggest some techniques that may work well for you. These may include: Breathing exercises. Routines to release muscle tension. Visualizing peaceful scenes. Make sure that you drive carefully. Avoid driving if you feel very sleepy. Keep all follow-up visits as told by your health care provider. This is important. Contact a health care provider if: You are tired throughout the day. You have trouble in your daily routine due to sleepiness. You continue to have sleep problems, or your sleep problems get worse. Get help right away if: You have serious thoughts about hurting yourself or someone else. If you ever feel like you may hurt yourself or others, or have thoughts about taking your own life, get help right away. You can go to your nearest emergency department or call: Your local emergency services (911 in the [place] A suicide crisis helpline, such as the [facility] at 435-392-5176. This is open 24 hours a day. Summary Insomnia is a sleep disorder that makes it difficult to fall asleep or stay asleep. Insomnia can be long-term (chronic) or short-term (acute). Treatment for insomnia depends on the cause. Treatment may focus on treating an underlying condition that is causing insomnia. Keep a sleep diary to help you and your health care provider figure out what could be causing your insomnia. This information is not intended to replace advice given to you by your health care provider. Make sure you discuss any questions you have with your healthcare provider. Document Revised: 01/18/2020 Document Reviewed: 01/18/2020 Elsevier Patient Education  2022 [employer].

## 2020-09-30 NOTE — Progress Notes (Signed)
Subjective:    Patient ID: [name], female    DOB: 10/20/1940, 80 y.o.   MRN: [id number]   Chief Complaint: medical management of chronic issues     HPI:  1. Essential hypertension No c/o chest pain, sob or headache. Does not check blood pressure at home. BP Readings from Last 3 Encounters:  07/30/20 136/85  07/19/20 123/78  07/16/20 (!) 148/94     2. Hyperlipidemia with target LDL less than 100 Does not watch diet and does no dedicated exercise. Lab Results  Component Value Date   CHOL 212 (H) 02/26/2020   HDL 41 02/26/2020   LDLCALC 103 (H) 02/26/2020   TRIG 404 (H) 02/26/2020   CHOLHDL 5.2 (H) 02/26/2020     3. Type 2 diabetes mellitus without complication, without long-term current use of insulin (HCC) Fasting blood sugars are running around 100-140. She has no low blood sugars. We made no changes to her meds at last visit. Lab Results  Component Value Date   HGBA1C 9.4 (H) 07/30/2020     4. ULCERATIVE COLITIS, LEFT SIDED Has diarrhea and constipation. Has abdominal pain daily. Is on pain meds for fibromyalgia which helps with colitis pain. Miralax has helped her if she takes it daily.  5. Stage 3b chronic kidney disease ([place]) Np problems voiding Lab Results  Component Value Date   CREATININE 1.38 (H) 07/15/2020     6. Recurrent major depressive disorder, in full remission ([place]) Is currently on combination of lexapro and buspar . Says she is doing well. No medication side effects.   7. Fibromyalgia Pain assessment: Cause of pain- fibromyalgia Pain location- varries from day to day Pain on scale of 1-10- 4/10 Frequency- daily What increases pain-nothing What makes pain Better-rest Effects on ADL - none Any change in general medical condition-none  Current opioids rx- roxicodone BID # meds rx- 60 Effectiveness of current meds-helps Adverse reactions from pain meds-none Morphine equivalent- 15MME  Pill count performed-No Last drug screen  - 10/24/19 ( high risk q38m moderate risk q630mlow risk yearly ) Urine drug screen today- No Was the [place]- yes  If yes were their any concerning findings? - no   Overdose risk: 1   Pain contract signed on:09/30/20   8. Primary insomnia Is on seroquel nightly to sleep. Sleeps about 2 hours and wakes up.    Outpatient Encounter Medications as of 09/30/2020  Medication Sig   amLODipine (NORVASC) 2.5 MG tablet Take 1 tablet (2.5 mg total) by mouth daily.   antiseptic oral rinse (BIOTENE) LIQD 1 application by Mouth Rinse route daily. FOR DRY MOUTH   benazepril-hydrochlorthiazide (LOTENSIN HCT) 20-12.5 MG tablet Take 2 tablets by mouth daily. TAKE (2) TABLETS  BY MOUTH ONCE DAILY.   blood glucose meter kit and supplies Dispense based on patient and insurance preference. Use up to four times daily as directed. (FOR ICD-10 E10.9, E11.9).   blood glucose meter kit and supplies Dispense based on patient and insurance preference. Use up to four times daily as directed. (FOR ICD-10 E10.9, E11.9).   busPIRone (BUSPAR) 10 MG tablet Take 1 tablet (10 mg total) by mouth 3 (three) times daily.   co-enzyme Q-10 50 MG capsule Take 50 mg by mouth once a week.   dicyclomine (BENTYL) 20 MG tablet Take 1/2-1 tablets (10-20 mg total) by mouth daily.   escitalopram (LEXAPRO) 20 MG tablet Take 1 tablet (20 mg total) by mouth daily.   glucose blood test strip Check blood sugar  daily and as needed   hydrocortisone (ANUSOL-HC) 2.5 % rectal cream Place 1 application rectally 2 (two) times daily.   Loperamide HCl (IMODIUM PO) Take 1 tablet by mouth daily as needed (diarrhea).   methocarbamol (ROBAXIN) 500 MG tablet Take 1 tablet (500 mg total) by mouth every 6 (six) hours as needed for muscle spasms.   Multiple Vitamins-Minerals (WOMENS MULTI VITAMIN & MINERAL PO) Take 1 tablet by mouth every other day.   Omega-3 Fatty Acids (FISH OIL) 1000 MG CPDR Take 3 capsules by mouth daily.    ondansetron (ZOFRAN) 4  MG tablet Take 1 tablet (4 mg total) by mouth every 8 (eight) hours as needed for nausea or vomiting.   oxyCODONE (ROXICODONE) 5 MG immediate release tablet Take 1 tablet (5 mg total) by mouth 2 (two) times daily.   oxyCODONE (ROXICODONE) 5 MG immediate release tablet Take 1 tablet (5 mg total) by mouth 2 (two) times daily.   oxyCODONE (ROXICODONE) 5 MG immediate release tablet Take 1 tablet (5 mg total) by mouth 2 (two) times daily.   polyethylene glycol (MIRALAX / GLYCOLAX) 17 g packet Take 1 packet daily with 8 ounces of water as needed for constipation.   Probiotic Product (PROBIOTIC FORMULA PO) Take 1-2 capsules by mouth every morning.   QUEtiapine (SEROQUEL) 100 MG tablet Take 1 tablet (100 mg total) by mouth at bedtime.   rosuvastatin (CRESTOR) 10 MG tablet Take 1 tablet (10 mg total) by mouth daily.   shark liver oil-cocoa butter (PREPARATION H) 0.25-3-85.5 % suppository Place 1 suppository rectally as needed for hemorrhoids.   sitaGLIPtin-metformin (JANUMET) 50-500 MG tablet Take 1 tablet by mouth 2 (two) times daily with a meal.   vitamin E 400 UNIT capsule Take 400 Units by mouth every morning.   [DISCONTINUED] sodium chloride (OCEAN) 0.65 % SOLN nasal spray Place 2 sprays into the nose as needed for congestion.   No facility-administered encounter medications on file as of 09/30/2020.    Past Surgical History:  Procedure Laterality Date   ABDOMINAL HYSTERECTOMY     BIOPSY N/A 12/28/2014   Procedure: SIGMOID AND RECTAL BIOPSIES;  Surgeon: Rogene Houston, MD;  Location: [facility];  Service: Endoscopy;  Laterality: N/A;   CHOLECYSTECTOMY     s/p   COLONOSCOPY  06/16/06. 04/27/2007   proctitis seen, biopsies showed chronic active colitis, no dysplasia    EYE SURGERY     clear lense extraction 1998    FLEXIBLE SIGMOIDOSCOPY N/A 12/28/2014   Procedure: FLEXIBLE SIGMOIDOSCOPY WITH PROPOFOL;  Surgeon: Rogene Houston, MD;  Location: [facility];  Service: Endoscopy;  Laterality: N/A;     Family History  Problem Relation Age of Onset   Heart attack Mother 51       Died in her sleep   Diabetes Mother    Heart attack Father 64       Died in her sleep   Heart attack Brother 37       Defib, CABG   Diabetes Brother    Kidney disease Brother        dyalasis    Cancer Sister        liver, breast    Heart disease Sister     New complaints: None today  Social history: Lives by herself  Controlled substance contract: 09/30/20     Review of Systems  Constitutional:  Negative for diaphoresis.  Eyes:  Negative for pain.  Respiratory:  Negative for shortness of breath.   Cardiovascular:  Negative for chest pain, palpitations and leg swelling.  Gastrointestinal:  Negative for abdominal pain.  Endocrine: Negative for polydipsia.  Skin:  Negative for rash.  Neurological:  Negative for dizziness, weakness and headaches.  Hematological:  Does not bruise/bleed easily.  All other systems reviewed and are negative.     Objective:   Physical Exam Vitals and nursing note reviewed.  Constitutional:      General: She is not in acute distress.    Appearance: Normal appearance. She is well-developed.  HENT:     Head: Normocephalic.     Right Ear: Tympanic membrane normal.     Left Ear: Tympanic membrane normal.     Nose: Nose normal.     Mouth/Throat:     Mouth: Mucous membranes are moist.  Eyes:     Pupils: Pupils are equal, round, and reactive to light.  Neck:     Vascular: No carotid bruit or JVD.  Cardiovascular:     Rate and Rhythm: Normal rate and regular rhythm.     Heart sounds: Normal heart sounds.  Pulmonary:     Effort: Pulmonary effort is normal. No respiratory distress.     Breath sounds: Normal breath sounds. No wheezing or rales.  Chest:     Chest wall: No tenderness.  Abdominal:     General: Bowel sounds are normal. There is no distension or abdominal bruit.     Palpations: Abdomen is soft. There is no hepatomegaly, splenomegaly, mass or  pulsatile mass.     Tenderness: There is no abdominal tenderness.  Musculoskeletal:        General: Normal range of motion.     Cervical back: Normal range of motion and neck supple.     Right lower leg: Edema (mild ankle) present.     Left lower leg: Edema (mild ankle) present.  Lymphadenopathy:     Cervical: No cervical adenopathy.  Skin:    General: Skin is warm and dry.  Neurological:     Mental Status: She is alert and oriented to person, place, and time.     Deep Tendon Reflexes: Reflexes are normal and symmetric.  Psychiatric:        Behavior: Behavior normal.        Thought Content: Thought content normal.        Judgment: Judgment normal.    BP 111/70   Pulse 94   Temp (!) 97.1 F (36.2 C) (Temporal)   Resp 20   Ht _0  (1.753 m)   Wt 165 lb (74.8 kg)   SpO2 94%   BMI 24.37 kg/m   HGBA1c 7.3%      Assessment & Plan:  [name] comes in today with chief complaint of No chief complaint on file.   Diagnosis and orders addressed:  1. Essential hypertension Low sodium diet - CBC with Differential/Platelet - CMP14+EGFR - amLODipine (NORVASC) 2.5 MG tablet; Take 1 tablet (2.5 mg total) by mouth daily.  Dispense: 90 tablet; Refill: 1 - benazepril-hydrochlorthiazide (LOTENSIN HCT) 20-12.5 MG tablet; Take 2 tablets by mouth daily. TAKE (2) TABLETS  BY MOUTH ONCE DAILY.  Dispense: 180 tablet; Refill: 3  2. Hyperlipidemia with target LDL less than 100 Low fat diet - Lipid panel  3. Type 2 diabetes mellitus without complication, without long-term current use of insulin (HCC) Continue tio wtach carbs in diet - Bayer DCA Hb A1c Waived - sitaGLIPtin-metformin (JANUMET) 50-500 MG tablet; Take 1 tablet by mouth 2 (two) times daily with a meal.  Dispense: 180 tablet; Refill: 1  4. ULCERATIVE COLITIS, LEFT SIDED Continue miralax daily - rosuvastatin (CRESTOR) 10 MG tablet; Take 1 tablet (10 mg total) by mouth daily.  Dispense: 90 tablet; Refill: 1 - ondansetron  (ZOFRAN) 4 MG tablet; Take 1 tablet (4 mg total) by mouth every 8 (eight) hours as needed for nausea or vomiting.  Dispense: 20 tablet; Refill: 2  5. Stage 3b chronic kidney disease ([place]) Labs pending  6. Recurrent major depressive disorder, in full remission ([place]) Stress management - busPIRone (BUSPAR) 10 MG tablet; Take 1 tablet (10 mg total) by mouth 3 (three) times daily.  Dispense: 60 tablet; Refill: 2 - escitalopram (LEXAPRO) 20 MG tablet; Take 1 tablet (20 mg total) by mouth daily.  Dispense: 90 tablet; Refill: 1  7. Fibromyalgia Stay active - oxyCODONE (ROXICODONE) 5 MG immediate release tablet; Take 1 tablet (5 mg total) by mouth 2 (two) times daily.  Dispense: 60 tablet; Refill: 0 - oxyCODONE (ROXICODONE) 5 MG immediate release tablet; Take 1 tablet (5 mg total) by mouth 2 (two) times daily.  Dispense: 60 tablet; Refill: 0 - oxyCODONE (ROXICODONE) 5 MG immediate release tablet; Take 1 tablet (5 mg total) by mouth 2 (two) times daily.  Dispense: 60 tablet; Refill: 0 - methocarbamol (ROBAXIN) 500 MG tablet; Take 1 tablet (500 mg total) by mouth every 6 (six) hours as needed for muscle spasms.  Dispense: 30 tablet; Refill: 1  8. Primary insomnia Stop seroquel Try trazadone- will discuss if helos at next appointmnet - traZODone (DESYREL) 50 MG tablet; Take 0.5-1 tablets (25-50 mg total) by mouth at bedtime as needed for sleep.  Dispense: 30 tablet; Refill: 3   Labs pending Health Maintenance reviewed Diet and exercise encouraged  Follow up plan: 3 months   [name] Hassell Done, FNP

## 2020-10-01 LAB — CMP14+EGFR
ALT: 16 IU/L (ref 0–32)
AST: 22 IU/L (ref 0–40)
Albumin/Globulin Ratio: 2.1 (ref 1.2–2.2)
Albumin: 4.6 g/dL (ref 3.7–4.7)
Alkaline Phosphatase: 51 IU/L (ref 44–121)
BUN/Creatinine Ratio: 16 (ref 12–28)
BUN: 25 mg/dL (ref 8–27)
Bilirubin Total: 0.5 mg/dL (ref 0.0–1.2)
CO2: 22 mmol/L (ref 20–29)
Calcium: 9.2 mg/dL (ref 8.7–10.3)
Chloride: 100 mmol/L (ref 96–106)
Creatinine, Ser: 1.6 mg/dL — ABNORMAL HIGH (ref 0.57–1.00)
Globulin, Total: 2.2 g/dL (ref 1.5–4.5)
Glucose: 150 mg/dL — ABNORMAL HIGH (ref 65–99)
Potassium: 4.8 mmol/L (ref 3.5–5.2)
Sodium: 139 mmol/L (ref 134–144)
Total Protein: 6.8 g/dL (ref 6.0–8.5)
eGFR: 33 mL/min/{1.73_m2} — ABNORMAL LOW (ref >59)

## 2020-10-01 LAB — CBC WITH DIFFERENTIAL/PLATELET
Basophils Absolute: 0.1 10*3/uL (ref 0.0–0.2)
Basos: 1 %
EOS (ABSOLUTE): 0.1 10*3/uL (ref 0.0–0.4)
Eos: 1 %
Hematocrit: 37.4 % (ref 34.0–46.6)
Hemoglobin: 12.7 g/dL (ref 11.1–15.9)
Immature Grans (Abs): 0 10*3/uL (ref 0.0–0.1)
Immature Granulocytes: 1 %
Lymphocytes Absolute: 1.7 10*3/uL (ref 0.7–3.1)
Lymphs: 26 %
MCH: 30.5 pg (ref 26.6–33.0)
MCHC: 34 g/dL (ref 31.5–35.7)
MCV: 90 fL (ref 79–97)
Monocytes Absolute: 0.4 10*3/uL (ref 0.1–0.9)
Monocytes: 6 %
Neutrophils Absolute: 4.3 10*3/uL (ref 1.4–7.0)
Neutrophils: 65 %
Platelets: 301 10*3/uL (ref 150–450)
RBC: 4.17 x10E6/uL (ref 3.77–5.28)
RDW: 13 % (ref 11.7–15.4)
WBC: 6.6 10*3/uL (ref 3.4–10.8)

## 2020-10-01 LAB — LIPID PANEL
Chol/HDL Ratio: 3.1 ratio (ref 0.0–4.4)
Cholesterol, Total: 111 mg/dL (ref 100–199)
HDL: 36 mg/dL — ABNORMAL LOW (ref >39)
LDL Chol Calc (NIH): 45 mg/dL (ref 0–99)
Triglycerides: 183 mg/dL — ABNORMAL HIGH (ref 0–149)
VLDL Cholesterol Cal: 30 mg/dL (ref 5–40)

## 2020-10-14 ENCOUNTER — Other Ambulatory Visit: Payer: Self-pay | Admitting: Nurse Practitioner

## 2020-10-14 DIAGNOSIS — M797 Fibromyalgia: Secondary | ICD-10-CM

## 2020-10-21 ENCOUNTER — Ambulatory Visit (INDEPENDENT_AMBULATORY_CARE_PROVIDER_SITE_OTHER): Payer: Medicare Other | Admitting: Nurse Practitioner

## 2020-10-21 ENCOUNTER — Encounter: Payer: Self-pay | Admitting: Nurse Practitioner

## 2020-10-21 VITALS — Temp 99.4°F

## 2020-10-21 DIAGNOSIS — J028 Acute pharyngitis due to other specified organisms: Secondary | ICD-10-CM

## 2020-10-21 DIAGNOSIS — H6691 Otitis media, unspecified, right ear: Secondary | ICD-10-CM | POA: Diagnosis not present

## 2020-10-21 DIAGNOSIS — J029 Acute pharyngitis, unspecified: Secondary | ICD-10-CM | POA: Insufficient documentation

## 2020-10-21 MED ORDER — CLINDAMYCIN HCL 300 MG PO CAPS: 300.0000 mg | ORAL_CAPSULE | Freq: Three times a day (TID) | ORAL | 0 refills | Status: DC

## 2020-10-21 NOTE — Progress Notes (Signed)
Virtual Visit  Note Due to COVID-19 pandemic this visit was conducted virtually. This visit type was conducted due to national recommendations for restrictions regarding the COVID-19 Pandemic (e.g. social distancing, sheltering in place) in an effort to limit this patient's exposure and mitigate transmission in our community. All issues noted in this document were discussed and addressed.  A physical exam was not performed with this format.  I connected with [name] on 10/21/20 at 12:05 PM by telephone and verified that I am speaking with the correct person using two identifiers. [name] is currently located at home during visit. The provider, Ivy Lynn, NP is located in their office at time of visit.  I discussed the limitations, risks, security and privacy concerns of performing an evaluation and management service by telephone and the availability of in person appointments. I also discussed with the patient that there may be a patient responsible charge related to this service. The patient expressed understanding and agreed to proceed.   History and Present Illness:  Sore Throat  This is a recurrent problem. The problem has been gradually worsening. Neither side of throat is experiencing more pain than the other. Maximum temperature: Chills. The pain is moderate. Associated symptoms include ear pain. Pertinent negatives include no coughing, ear discharge, swollen glands or trouble swallowing. Associated symptoms comments: Nausea. She has had no exposure to strep.  Otalgia  There is pain in both ears. This is a recurrent problem. The current episode started in the past 7 days. The problem occurs constantly. The problem has been gradually worsening. There has been no fever. The pain is moderate. Associated symptoms include a sore throat. Pertinent negatives include no coughing, ear discharge or rash. She has tried nothing for the symptoms.     Review of Systems  Constitutional:   Positive for chills.  HENT:  Positive for ear pain and sore throat. Negative for ear discharge and trouble swallowing.   Respiratory:  Negative for cough.   Skin:  Negative for rash.  All other systems reviewed and are negative.   Observations/Objective: Televisit patient not in distress  Assessment and Plan: Symptoms of pharyngitis not well controlled.  Unable to find appropriate antibiotic for patient due to number of allergies.  Started patient on clindamycin 300 mg capsule by mouth 3 times daily advised patient to increase hydration, gargle with salt water or Chloraseptic spray, Tylenol for pain and fever,   Symptoms not well controlled.  Otalgia recurrent in the last 1 week.  Clindamycin 300 mg capsule 3 times daily.  Education provided to patient.  Rx sent to pharmacy.  Follow-up with worsening unresolved symptoms.    Follow Up Instructions: Follow-up with worsening unresolved symptoms.    I discussed the assessment and treatment plan with the patient. The patient was provided an opportunity to ask questions and all were answered. The patient agreed with the plan and demonstrated an understanding of the instructions.   The patient was advised to call back or seek an in-person evaluation if the symptoms worsen or if the condition fails to improve as anticipated.  The above assessment and management plan was discussed with the patient. The patient verbalized understanding of and has agreed to the management plan. Patient is aware to call the clinic if symptoms persist or worsen. Patient is aware when to return to the clinic for a follow-up visit. Patient educated on when it is appropriate to go to the emergency department.   Time call ended: 12:16 PM  I provided 11 minutes of  non face-to-face time during this encounter.    Ivy Lynn, NP

## 2020-10-21 NOTE — Assessment & Plan Note (Signed)
Symptoms not well controlled.  Otalgia recurrent in the last 1 week.  Clindamycin 300 mg capsule 3 times daily.  Education provided to patient.  Rx sent to pharmacy.  Follow-up with worsening unresolved symptoms.

## 2020-10-21 NOTE — Assessment & Plan Note (Signed)
Symptoms of pharyngitis not well controlled in the last 7 days.  Increase hydration, Chloraseptic spray, Tylenol for pain and fever, clindamycin 300 mg tablet by mouth 3 times daily.  Follow-up with worsening unresolved symptoms.

## 2020-10-29 ENCOUNTER — Other Ambulatory Visit: Payer: Self-pay | Admitting: Nurse Practitioner

## 2020-10-29 DIAGNOSIS — M797 Fibromyalgia: Secondary | ICD-10-CM

## 2020-10-30 DIAGNOSIS — H5213 Myopia, bilateral: Secondary | ICD-10-CM | POA: Diagnosis not present

## 2020-10-30 LAB — HM DIABETES EYE EXAM

## 2020-11-11 ENCOUNTER — Other Ambulatory Visit: Payer: Self-pay | Admitting: Nurse Practitioner

## 2020-12-10 ENCOUNTER — Other Ambulatory Visit: Payer: Self-pay | Admitting: Nurse Practitioner

## 2020-12-10 DIAGNOSIS — K5150 Left sided colitis without complications: Secondary | ICD-10-CM

## 2020-12-19 ENCOUNTER — Ambulatory Visit (INDEPENDENT_AMBULATORY_CARE_PROVIDER_SITE_OTHER): Payer: Medicare Other | Admitting: Nurse Practitioner

## 2020-12-19 ENCOUNTER — Encounter: Payer: Self-pay | Admitting: Nurse Practitioner

## 2020-12-19 DIAGNOSIS — K5732 Diverticulitis of large intestine without perforation or abscess without bleeding: Secondary | ICD-10-CM

## 2020-12-19 MED ORDER — METRONIDAZOLE 500 MG PO TABS
500.0000 mg | ORAL_TABLET | Freq: Two times a day (BID) | ORAL | 0 refills | Status: DC
Start: 2020-12-19 — End: 2021-01-10

## 2020-12-19 MED ORDER — CIPROFLOXACIN HCL 500 MG PO TABS: 500.0000 mg | ORAL_TABLET | Freq: Two times a day (BID) | ORAL | 0 refills | Status: DC

## 2020-12-19 NOTE — Progress Notes (Signed)
Virtual Visit  Note Due to COVID-19 pandemic this visit was conducted virtually. This visit type was conducted due to national recommendations for restrictions regarding the COVID-19 Pandemic (e.g. social distancing, sheltering in place) in an effort to limit this patient's exposure and mitigate transmission in our community. All issues noted in this document were discussed and addressed.  A physical exam was not performed with this format.  I connected with [name] on 12/19/20 at 10:25 by telephone and verified that I am speaking with the correct person using two identifiers. [name] is currently located at home and no one  is currently with her during visit. The provider, Mary-Margaret Hassell Done, FNP is located in their office at time of visit.  I discussed the limitations, risks, security and privacy concerns of performing an evaluation and management service by telephone and the availability of in person appointments. I also discussed with the patient that there may be a patient responsible charge related to this service. The patient expressed understanding and agreed to proceed.   History and Present Illness:  Patient calls in c/o of severe abdominal pain. She is having a flare up of her diverticulitis. The pain is 10/10. No fever.    Review of Systems  Constitutional:  Negative for diaphoresis and weight loss.  Eyes:  Negative for blurred vision, double vision and pain.  Respiratory:  Negative for shortness of breath.   Cardiovascular:  Negative for chest pain, palpitations, orthopnea and leg swelling.  Gastrointestinal:  Negative for abdominal pain.  Skin:  Negative for rash.  Neurological:  Negative for dizziness, sensory change, loss of consciousness, weakness and headaches.  Endo/Heme/Allergies:  Negative for polydipsia. Does not bruise/bleed easily.  Psychiatric/Behavioral:  Negative for memory loss. The patient does not have insomnia.   All other systems reviewed and  are negative.   Observations/Objective: Alert and oriented- answers all questions appropriately No distress Abdominal pain on palpation  Assessment and Plan: [name] in today with chief complaint of No chief complaint on file.   1. Diverticulitis large intestine w/o perforation or abscess w/o bleeding Meds ordered this encounter  Medications   ciprofloxacin (CIPRO) 500 MG tablet    Sig: Take 1 tablet (500 mg total) by mouth 2 (two) times daily.    Dispense:  14 tablet    Refill:  0    Order Specific Question:   Supervising Provider    Answer:   Caryl Pina A [[id number]]   metroNIDAZOLE (FLAGYL) 500 MG tablet    Sig: Take 1 tablet (500 mg total) by mouth 2 (two) times daily.    Dispense:  14 tablet    Refill:  0    Order Specific Question:   Supervising Provider    Answer:   Caryl Pina A [id number]   Patient wanted to increase pain meds- discussed that we do not want to increase. Told her to take 1/2 of pain meds at noon time then take other half tonight. Watch diet Imodium OTC for diarrhea. RTO prn   Follow Up Instructions: prn    I discussed the assessment and treatment plan with the patient. The patient was provided an opportunity to ask questions and all were answered. The patient agreed with the plan and demonstrated an understanding of the instructions.   The patient was advised to call back or seek an in-person evaluation if the symptoms worsen or if the condition fails to improve as anticipated.  The above assessment and management plan was discussed with the  patient. The patient verbalized understanding of and has agreed to the management plan. Patient is aware to call the clinic if symptoms persist or worsen. Patient is aware when to return to the clinic for a follow-up visit. Patient educated on when it is appropriate to go to the emergency department.   Time call ended:  10:37  I provided 12 minutes of  non face-to-face time during this  encounter.    Mary-Margaret Hassell Done, FNP

## 2021-01-06 ENCOUNTER — Telehealth: Payer: Self-pay | Admitting: Nurse Practitioner

## 2021-01-06 DIAGNOSIS — M797 Fibromyalgia: Secondary | ICD-10-CM

## 2021-01-07 NOTE — Telephone Encounter (Signed)
Patient called and appointment moved to Friday October 21st.

## 2021-01-07 NOTE — Telephone Encounter (Signed)
Pt saw mmm  September 30, 2020 and mmm made her next appointment for 01/13/21. Her AVS said to come back around 12/31/20. Pt is out of oxycodone and mmm refused to refill meds. Please advise

## 2021-01-10 ENCOUNTER — Encounter: Payer: Self-pay | Admitting: Nurse Practitioner

## 2021-01-10 ENCOUNTER — Other Ambulatory Visit: Payer: Self-pay

## 2021-01-10 ENCOUNTER — Ambulatory Visit (INDEPENDENT_AMBULATORY_CARE_PROVIDER_SITE_OTHER): Payer: Medicare Other | Admitting: Nurse Practitioner

## 2021-01-10 VITALS — BP 106/62 | HR 79 | Temp 97.6°F | Resp 20 | Ht 69.0 in | Wt 154.0 lb

## 2021-01-10 DIAGNOSIS — F3342 Major depressive disorder, recurrent, in full remission: Secondary | ICD-10-CM

## 2021-01-10 DIAGNOSIS — E785 Hyperlipidemia, unspecified: Secondary | ICD-10-CM | POA: Diagnosis not present

## 2021-01-10 DIAGNOSIS — M797 Fibromyalgia: Secondary | ICD-10-CM

## 2021-01-10 DIAGNOSIS — R001 Bradycardia, unspecified: Secondary | ICD-10-CM

## 2021-01-10 DIAGNOSIS — F5101 Primary insomnia: Secondary | ICD-10-CM

## 2021-01-10 DIAGNOSIS — I10 Essential (primary) hypertension: Secondary | ICD-10-CM | POA: Diagnosis not present

## 2021-01-10 DIAGNOSIS — E1122 Type 2 diabetes mellitus with diabetic chronic kidney disease: Secondary | ICD-10-CM

## 2021-01-10 DIAGNOSIS — N1832 Chronic kidney disease, stage 3b: Secondary | ICD-10-CM | POA: Diagnosis not present

## 2021-01-10 DIAGNOSIS — K5150 Left sided colitis without complications: Secondary | ICD-10-CM

## 2021-01-10 LAB — BAYER DCA HB A1C WAIVED: HB A1C (BAYER DCA - WAIVED): 6.5 % — ABNORMAL HIGH (ref 4.8–5.6)

## 2021-01-10 MED ORDER — OXYCODONE HCL 5 MG PO TABS: 5.0000 mg | ORAL_TABLET | Freq: Two times a day (BID) | ORAL | 0 refills | Status: DC

## 2021-01-10 MED ORDER — AMLODIPINE BESYLATE 2.5 MG PO TABS: 2.5000 mg | ORAL_TABLET | Freq: Every day | ORAL | 1 refills | Status: DC

## 2021-01-10 MED ORDER — BENAZEPRIL-HYDROCHLOROTHIAZIDE 20-12.5 MG PO TABS: 2.0000 | ORAL_TABLET | Freq: Every day | ORAL | 3 refills | Status: DC

## 2021-01-10 MED ORDER — SITAGLIPTIN PHOSPHATE 100 MG PO TABS: 100.0000 mg | ORAL_TABLET | Freq: Every day | ORAL | 1 refills | Status: DC

## 2021-01-10 MED ORDER — BUSPIRONE HCL 10 MG PO TABS: 10.0000 mg | ORAL_TABLET | Freq: Three times a day (TID) | ORAL | 2 refills | Status: DC

## 2021-01-10 MED ORDER — ESCITALOPRAM OXALATE 20 MG PO TABS: 20.0000 mg | ORAL_TABLET | Freq: Every day | ORAL | 1 refills | Status: DC

## 2021-01-10 MED ORDER — TRAZODONE HCL 50 MG PO TABS: 25.0000 mg | ORAL_TABLET | Freq: Every evening | ORAL | 1 refills | Status: DC | PRN

## 2021-01-10 MED ORDER — ROSUVASTATIN CALCIUM 10 MG PO TABS: 10.0000 mg | ORAL_TABLET | Freq: Every day | ORAL | 1 refills | Status: DC

## 2021-01-10 NOTE — Patient Instructions (Signed)
Carbohydrate Counting for Diabetes Mellitus, Adult Carbohydrate counting is a method of keeping track of how many carbohydrates you eat. Eating carbohydrates naturally increases the amount of sugar (glucose) in the blood. Counting how many carbohydrates you eat improves your blood glucose control, which helps you manage your diabetes. It is important to know how many carbohydrates you can safely have in each meal. This is different for every person. A dietitian can help you make a meal plan and calculate how many carbohydrates you should have at each meal and snack. What foods contain carbohydrates? Carbohydrates are found in the following foods: Grains, such as breads and cereals. Dried beans and soy products. Starchy vegetables, such as potatoes, peas, and corn. Fruit and fruit juices. Milk and yogurt. Sweets and snack foods, such as cake, cookies, candy, chips, and soft drinks. How do I count carbohydrates in foods? There are two ways to count carbohydrates in food. You can read food labels or learn standard serving sizes of foods. You can use either of the methods or a combination of both. Using the Nutrition Facts label The Nutrition Facts list is included on the labels of almost all packaged foods and beverages in the [place] It includes: The serving size. Information about nutrients in each serving, including the grams (g) of carbohydrate per serving. To use the Nutrition Facts: Decide how many servings you will have. Multiply the number of servings by the number of carbohydrates per serving. The resulting number is the total amount of carbohydrates that you will be having. Learning the standard serving sizes of foods When you eat carbohydrate foods that are not packaged or do not include Nutrition Facts on the label, you need to measure the servings in order to count the amount of carbohydrates. Measure the foods that you will eat with a food scale or measuring cup, if needed. Decide how  many standard-size servings you will eat. Multiply the number of servings by 15. For foods that contain carbohydrates, one serving equals 15 g of carbohydrates. For example, if you eat 2 cups or 10 oz (300 g) of strawberries, you will have eaten 2 servings and 30 g of carbohydrates (2 servings x 15 g = 30 g). For foods that have more than one food mixed, such as soups and casseroles, you must count the carbohydrates in each food that is included. The following list contains standard serving sizes of common carbohydrate-rich foods. Each of these servings has about 15 g of carbohydrates: 1 slice of bread. 1 six-inch (15 cm) tortilla. ? cup or 2 oz (53 g) cooked rice or pasta.  cup or 3 oz (85 g) cooked or canned, drained and rinsed beans or lentils.  cup or 3 oz (85 g) starchy vegetable, such as peas, corn, or squash.  cup or 4 oz (120 g) hot cereal.  cup or 3 oz (85 g) boiled or mashed potatoes, or  or 3 oz (85 g) of a large baked potato.  cup or 4 fl oz (118 mL) fruit juice. 1 cup or 8 fl oz (237 mL) milk. 1 small or 4 oz (106 g) apple.  or 2 oz (63 g) of a medium banana. 1 cup or 5 oz (150 g) strawberries. 3 cups or 1 oz (24 g) popped popcorn. What is an example of carbohydrate counting? To calculate the number of carbohydrates in this sample meal, follow the steps shown below. Sample meal 3 oz (85 g) chicken breast. ? cup or 4 oz (106 g) brown  rice.  cup or 3 oz (85 g) corn. 1 cup or 8 fl oz (237 mL) milk. 1 cup or 5 oz (150 g) strawberries with sugar-free whipped topping. Carbohydrate calculation Identify the foods that contain carbohydrates: Rice. Corn. Milk. Strawberries. Calculate how many servings you have of each food: 2 servings rice. 1 serving corn. 1 serving milk. 1 serving strawberries. Multiply each number of servings by 15 g: 2 servings rice x 15 g = 30 g. 1 serving corn x 15 g = 15 g. 1 serving milk x 15 g = 15 g. 1 serving strawberries x 15 g = 15  g. Add together all of the amounts to find the total grams of carbohydrates eaten: 30 g + 15 g + 15 g + 15 g = 75 g of carbohydrates total. What are tips for following this plan? Shopping Develop a meal plan and then make a shopping list. Buy fresh and frozen vegetables, fresh and frozen fruit, dairy, eggs, beans, lentils, and whole grains. Look at food labels. Choose foods that have more fiber and less sugar. Avoid processed foods and foods with added sugars. Meal planning Aim to have the same amount of carbohydrates at each meal and for each snack time. Plan to have regular, balanced meals and snacks. Where to find more information American Diabetes Association: www.diabetes.org Centers for Disease Control and Prevention: http://www.wolf.info/ Summary Carbohydrate counting is a method of keeping track of how many carbohydrates you eat. Eating carbohydrates naturally increases the amount of sugar (glucose) in the blood. Counting how many carbohydrates you eat improves your blood glucose control, which helps you manage your diabetes. A dietitian can help you make a meal plan and calculate how many carbohydrates you should have at each meal and snack. This information is not intended to replace advice given to you by your health care provider. Make sure you discuss any questions you have with your health care provider. Document Revised: 03/09/2019 Document Reviewed: 03/10/2019 Elsevier Patient Education  2021 [employer].

## 2021-01-10 NOTE — Progress Notes (Signed)
Subjective:    Patient ID: [name], female    DOB: [birth year], 80 y.o.   MRN: [id number]  Chief Complaint: medical management of chronic issues     HPI:  1. Essential hypertension No c/o chest pan, sob or headache. Does not check blood pressure at home. BP Readings from Last 3 Encounters:  09/30/20 111/70  07/30/20 136/85  07/19/20 123/78     2. Bradycardia No syncopial or near syncopial episodes  3. Type 2 diabetes mellitus with stage 3b chronic kidney disease, without long-term current use of insulin (HCC) Fasting blood sugars running between 110-140. O low blood sugars. Denies any low blood sugars. She says the janumet is giving her diarrhea. Lab Results  Component Value Date   HGBA1C 7.3 (H) 09/30/2020     4. Stage 3b chronic kidney disease ([place]) Lab Results  Component Value Date   CREATININE 1.60 (H) 09/30/2020     5. Hyperlipidemia with target LDL less than 100 Does try to watch diet but does no dedicated exercise. Lab Results  Component Value Date   CHOL 111 09/30/2020   HDL 36 (L) 09/30/2020   LDLCALC 45 09/30/2020   TRIG 183 (H) 09/30/2020   CHOLHDL 3.1 09/30/2020     6. Primary insomnia Is on trazadone to sleep. Sleeps about 7-8 hours anight  7. Recurrent major depressive disorder, in full remission ([place]) Is on combination of lexapro and buspar. She  doing well despite the death of her brother recently. Depression screen [facility] 2/9 01/10/2021 09/30/2020 09/27/2020  Decreased Interest 0 0 0  Down, Depressed, Hopeless 0 0 0  PHQ - 2 Score 0 0 0  Altered sleeping 1 1 -  Tired, decreased energy 0 1 -  Change in appetite 0 0 -  Feeling bad or failure about yourself  0 0 -  Trouble concentrating 0 0 -  Moving slowly or fidgety/restless 0 0 -  Suicidal thoughts 0 0 -  PHQ-9 Score 1 2 -  Difficult doing work/chores Not difficult at all Not difficult at all -  Some recent data might be hidden     8. ULCERATIVE COLITIS, LEFT SIDED Has chronic  pain. See below  9. Fibromyalgia Pain assessment: Cause of pain- ulcerative colitis and fibromyalgia Pain location- abdomen and muscles Pain on scale of 1-10- 5/10 currently Frequency- daily What increases pain-nothing What makes pain Better-nothing Effects on ADL - none Any change in general medical condition-none  Current opioids rx- oxycodone 48m bid # meds rx- 60 Effectiveness of current meds-none Adverse reactions from pain meds-none Morphine equivalent- 15MME  Pill count performed-No Last drug screen - 10/24/19 ( high risk [id number] risk [id number] risk yearly ) Urine drug screen today- Yes [place]  If yes were their any concerning findings? - no   Overdose risk: 1    Pain contract signed on: 10/03/20  Wt Readings from Last 3 Encounters:  01/10/21 154 lb (69.9 kg)  09/30/20 165 lb (74.8 kg)  07/30/20 167 lb (75.8 kg)   BMI Readings from Last 3 Encounters:  01/10/21 22.74 kg/m  09/30/20 24.37 kg/m  07/30/20 24.66 kg/m      Outpatient Encounter Medications as of 01/10/2021  Medication Sig   amLODipine (NORVASC) 2.5 MG tablet Take 1 tablet (2.5 mg total) by mouth daily.   antiseptic oral rinse (BIOTENE) LIQD 1 application by Mouth Rinse route daily. FOR DRY MOUTH   benazepril-hydrochlorthiazide (LOTENSIN HCT) 20-12.5 MG tablet Take 2 tablets by  mouth daily. TAKE (2) TABLETS  BY MOUTH ONCE DAILY.   blood glucose meter kit and supplies Dispense based on patient and insurance preference. Use up to four times daily as directed. (FOR ICD-10 E10.9, E11.9).   blood glucose meter kit and supplies Dispense based on patient and insurance preference. Use up to four times daily as directed. (FOR ICD-10 E10.9, E11.9).   busPIRone (BUSPAR) 10 MG tablet Take 1 tablet (10 mg total) by mouth 3 (three) times daily.   ciprofloxacin (CIPRO) 500 MG tablet Take 1 tablet (500 mg total) by mouth 2 (two) times daily.   clindamycin (CLEOCIN) 300 MG capsule Take 1  capsule (300 mg total) by mouth 3 (three) times daily.   co-enzyme Q-10 50 MG capsule Take 50 mg by mouth once a week.   dicyclomine (BENTYL) 20 MG tablet Take 1/2-1 tablets (10-20 mg total) by mouth daily.   escitalopram (LEXAPRO) 20 MG tablet Take 1 tablet (20 mg total) by mouth daily.   glucose blood test strip Check blood sugar daily and as needed   hydrocortisone (ANUSOL-HC) 2.5 % rectal cream Place 1 application rectally 2 (two) times daily.   Loperamide HCl (IMODIUM PO) Take 1 tablet by mouth daily as needed (diarrhea).   methocarbamol (ROBAXIN) 500 MG tablet TAKE ONE TABLET BY MOUTH EVERY 6 HOURS AS NEEDED FOR MUSCLE SPASMS   metroNIDAZOLE (FLAGYL) 500 MG tablet Take 1 tablet (500 mg total) by mouth 2 (two) times daily.   Multiple Vitamins-Minerals (WOMENS MULTI VITAMIN & MINERAL PO) Take 1 tablet by mouth every other day.   Omega-3 Fatty Acids (FISH OIL) 1000 MG CPDR Take 3 capsules by mouth daily.    ondansetron (ZOFRAN) 4 MG tablet Take 1 tablet (4 mg total) by mouth every 8 (eight) hours as needed for nausea or vomiting.   oxyCODONE (ROXICODONE) 5 MG immediate release tablet Take 1 tablet (5 mg total) by mouth 2 (two) times daily.   oxyCODONE (ROXICODONE) 5 MG immediate release tablet Take 1 tablet (5 mg total) by mouth 2 (two) times daily.   oxyCODONE (ROXICODONE) 5 MG immediate release tablet Take 1 tablet (5 mg total) by mouth 2 (two) times daily.   polyethylene glycol (MIRALAX / GLYCOLAX) 17 g packet Take 1 packet daily with 8 ounces of water as needed for constipation.   Probiotic Product (PROBIOTIC FORMULA PO) Take 1-2 capsules by mouth every morning.   rosuvastatin (CRESTOR) 10 MG tablet Take 1 tablet (10 mg total) by mouth daily.   shark liver oil-cocoa butter (PREPARATION H) 0.25-3-85.5 % suppository Place 1 suppository rectally as needed for hemorrhoids.   sitaGLIPtin-metformin (JANUMET) 50-500 MG tablet Take 1 tablet by mouth 2 (two) times daily with a meal.   traZODone  (DESYREL) 50 MG tablet Take 0.5-1 tablets (25-50 mg total) by mouth at bedtime as needed for sleep.   vitamin E 400 UNIT capsule Take 400 Units by mouth every morning.   [DISCONTINUED] sodium chloride (OCEAN) 0.65 % SOLN nasal spray Place 2 sprays into the nose as needed for congestion.   No facility-administered encounter medications on file as of 01/10/2021.    Past Surgical History:  Procedure Laterality Date   ABDOMINAL HYSTERECTOMY     BIOPSY N/A 12/28/2014   Procedure: SIGMOID AND RECTAL BIOPSIES;  Surgeon: Rogene Houston, MD;  Location: [facility];  Service: Endoscopy;  Laterality: N/A;   CHOLECYSTECTOMY     s/p   COLONOSCOPY  06/16/06. 04/27/2007   proctitis seen, biopsies showed chronic active  colitis, no dysplasia    EYE SURGERY     clear lense extraction 1998    FLEXIBLE SIGMOIDOSCOPY N/A 12/28/2014   Procedure: FLEXIBLE SIGMOIDOSCOPY WITH PROPOFOL;  Surgeon: Rogene Houston, MD;  Location: [facility];  Service: Endoscopy;  Laterality: N/A;    Family History  Problem Relation Age of Onset   Heart attack Mother 84       Died in her sleep   Diabetes Mother    Heart attack Father 27       Died in her sleep   Heart attack Brother 50       Defib, CABG   Diabetes Brother    Kidney disease Brother        dyalasis    Cancer Sister        liver, breast    Heart disease Sister     New complaints: None today  Social history: Lives by herself. Family checks on her frequently  Controlled substance contract: 10/03/20     Review of Systems  Constitutional:  Negative for diaphoresis.  Eyes:  Negative for pain.  Respiratory:  Negative for shortness of breath.   Cardiovascular:  Negative for chest pain, palpitations and leg swelling.  Gastrointestinal:  Negative for abdominal pain.  Endocrine: Negative for polydipsia.  Skin:  Negative for rash.  Neurological:  Negative for dizziness, weakness and headaches.  Hematological:  Does not bruise/bleed easily.  All other systems  reviewed and are negative.     Objective:   Physical Exam Vitals and nursing note reviewed.  Constitutional:      General: She is not in acute distress.    Appearance: Normal appearance. She is well-developed.  HENT:     Head: Normocephalic.     Right Ear: Tympanic membrane normal.     Left Ear: Tympanic membrane normal.     Nose: Nose normal.     Mouth/Throat:     Mouth: Mucous membranes are moist.  Eyes:     Pupils: Pupils are equal, round, and reactive to light.  Neck:     Vascular: No carotid bruit or JVD.  Cardiovascular:     Rate and Rhythm: Normal rate and regular rhythm.     Heart sounds: Normal heart sounds.  Pulmonary:     Effort: Pulmonary effort is normal. No respiratory distress.     Breath sounds: Normal breath sounds. No wheezing or rales.  Chest:     Chest wall: No tenderness.  Abdominal:     General: Bowel sounds are normal. There is no distension or abdominal bruit.     Palpations: Abdomen is soft. There is no hepatomegaly, splenomegaly, mass or pulsatile mass.     Tenderness: There is no abdominal tenderness.  Musculoskeletal:        General: Normal range of motion.     Cervical back: Normal range of motion and neck supple.  Lymphadenopathy:     Cervical: No cervical adenopathy.  Skin:    General: Skin is warm and dry.  Neurological:     Mental Status: She is alert and oriented to person, place, and time.     Deep Tendon Reflexes: Reflexes are normal and symmetric.  Psychiatric:        Behavior: Behavior normal.        Thought Content: Thought content normal.        Judgment: Judgment normal.     BP 106/62   Pulse 79   Temp 97.6 F (36.4 C) (Temporal)  Resp 20   Ht 5' 9"  (1.753 m)   Wt 154 lb (69.9 kg)   SpO2 98%   BMI 22.74 kg/m  Hgba1c d6.5%      Assessment & Plan:  [name] comes in today with chief complaint of Medical Management of Chronic Issues   Diagnosis and orders addressed:  1. Essential hypertension Low sodium  diet - CBC with Differential/Platelet - CMP14+EGFR - amLODipine (NORVASC) 2.5 MG tablet; Take 1 tablet (2.5 mg total) by mouth daily.  Dispense: 90 tablet; Refill: 1 - benazepril-hydrochlorthiazide (LOTENSIN HCT) 20-12.5 MG tablet; Take 2 tablets by mouth daily. TAKE (2) TABLETS  BY MOUTH ONCE DAILY.  Dispense: 180 tablet; Refill: 3  2. Bradycardia Report any syncopal episodes  3. Type 2 diabetes mellitus with stage 3b chronic kidney disease, without long-term current use of insulin (HCC) Stop janumet Changed to januvia 143m daily Strict carb counting - Bayer DCA Hb A1c Waived  4. Stage 3b chronic kidney disease ([place] pending  5. Hyperlipidemia with target LDL less than 100 Low fat diet - Lipid panel - rosuvastatin (CRESTOR) 10 MG tablet; Take 1 tablet (10 mg total) by mouth daily.  Dispense: 90 tablet; Refill: 1  6. Primary insomnia Bedtime routines - traZODone (DESYREL) 50 MG tablet; Take 0.5-1 tablets (25-50 mg total) by mouth at bedtime as needed for sleep.  Dispense: 90 tablet; Refill: 1  7. Recurrent major depressive disorder, in full remission ([place] management - escitalopram (LEXAPRO) 20 MG tablet; Take 1 tablet (20 mg total) by mouth daily.  Dispense: 90 tablet; Refill: 1 - busPIRone (BUSPAR) 10 MG tablet; Take 1 tablet (10 mg total) by mouth 3 (three) times daily.  Dispense: 60 tablet; Refill: 2  8. ULCERATIVE COLITIS, LEFT SIDED Watch diet to prevent flare up  9. Fibromyalgia Moist heat Stay active - oxyCODONE (ROXICODONE) 5 MG immediate release tablet; Take 1 tablet (5 mg total) by mouth 2 (two) times daily.  Dispense: 60 tablet; Refill: 0 - oxyCODONE (ROXICODONE) 5 MG immediate release tablet; Take 1 tablet (5 mg total) by mouth 2 (two) times daily.  Dispense: 60 tablet; Refill: 0 - oxyCODONE (ROXICODONE) 5 MG immediate release tablet; Take 1 tablet (5 mg total) by mouth 2 (two) times daily.  Dispense: 60 tablet; Refill: 0   Labs pending Health  Maintenance reviewed Diet and exercise encouraged  Follow up plan: 3 months   Mary-Margaret MHassell Done FNP

## 2021-01-11 LAB — CMP14+EGFR
ALT: 12 IU/L (ref 0–32)
AST: 19 IU/L (ref 0–40)
Albumin/Globulin Ratio: 1.9 (ref 1.2–2.2)
Albumin: 4.3 g/dL (ref 3.7–4.7)
Alkaline Phosphatase: 46 IU/L (ref 44–121)
BUN/Creatinine Ratio: 14 (ref 12–28)
BUN: 24 mg/dL (ref 8–27)
Bilirubin Total: 0.7 mg/dL (ref 0.0–1.2)
CO2: 25 mmol/L (ref 20–29)
Calcium: 9.4 mg/dL (ref 8.7–10.3)
Chloride: 90 mmol/L — ABNORMAL LOW (ref 96–106)
Creatinine, Ser: 1.72 mg/dL — ABNORMAL HIGH (ref 0.57–1.00)
Globulin, Total: 2.3 g/dL (ref 1.5–4.5)
Glucose: 119 mg/dL — ABNORMAL HIGH (ref 70–99)
Potassium: 4.1 mmol/L (ref 3.5–5.2)
Sodium: 128 mmol/L — ABNORMAL LOW (ref 134–144)
Total Protein: 6.6 g/dL (ref 6.0–8.5)
eGFR: 30 mL/min/{1.73_m2} — ABNORMAL LOW (ref >59)

## 2021-01-11 LAB — CBC WITH DIFFERENTIAL/PLATELET
Basophils Absolute: 0.1 10*3/uL (ref 0.0–0.2)
Basos: 1 %
EOS (ABSOLUTE): 0 10*3/uL (ref 0.0–0.4)
Eos: 1 %
Hematocrit: 36.7 % (ref 34.0–46.6)
Hemoglobin: 12.4 g/dL (ref 11.1–15.9)
Immature Grans (Abs): 0 10*3/uL (ref 0.0–0.1)
Immature Granulocytes: 0 %
Lymphocytes Absolute: 1.9 10*3/uL (ref 0.7–3.1)
Lymphs: 32 %
MCH: 30.2 pg (ref 26.6–33.0)
MCHC: 33.8 g/dL (ref 31.5–35.7)
MCV: 89 fL (ref 79–97)
Monocytes Absolute: 0.5 10*3/uL (ref 0.1–0.9)
Monocytes: 8 %
Neutrophils Absolute: 3.5 10*3/uL (ref 1.4–7.0)
Neutrophils: 58 %
Platelets: 306 10*3/uL (ref 150–450)
RBC: 4.11 x10E6/uL (ref 3.77–5.28)
RDW: 12.6 % (ref 11.7–15.4)
WBC: 5.9 10*3/uL (ref 3.4–10.8)

## 2021-01-11 LAB — LIPID PANEL
Chol/HDL Ratio: 2.8 ratio (ref 0.0–4.4)
Cholesterol, Total: 114 mg/dL (ref 100–199)
HDL: 41 mg/dL (ref >39)
LDL Chol Calc (NIH): 44 mg/dL (ref 0–99)
Triglycerides: 174 mg/dL — ABNORMAL HIGH (ref 0–149)
VLDL Cholesterol Cal: 29 mg/dL (ref 5–40)

## 2021-01-13 ENCOUNTER — Ambulatory Visit: Payer: Self-pay | Admitting: Nurse Practitioner

## 2021-02-25 ENCOUNTER — Other Ambulatory Visit: Payer: Self-pay | Admitting: Nurse Practitioner

## 2021-04-08 ENCOUNTER — Ambulatory Visit: Payer: Medicare Other | Admitting: Nurse Practitioner

## 2021-04-09 ENCOUNTER — Encounter: Payer: Self-pay | Admitting: Nurse Practitioner

## 2021-04-10 ENCOUNTER — Encounter: Payer: Self-pay | Admitting: Nurse Practitioner

## 2021-04-10 ENCOUNTER — Ambulatory Visit (INDEPENDENT_AMBULATORY_CARE_PROVIDER_SITE_OTHER): Payer: Medicare Other | Admitting: Nurse Practitioner

## 2021-04-10 VITALS — BP 116/63 | HR 86 | Temp 97.5°F | Resp 20 | Ht 69.0 in | Wt 150.0 lb

## 2021-04-10 DIAGNOSIS — E1122 Type 2 diabetes mellitus with diabetic chronic kidney disease: Secondary | ICD-10-CM

## 2021-04-10 DIAGNOSIS — I10 Essential (primary) hypertension: Secondary | ICD-10-CM

## 2021-04-10 DIAGNOSIS — K5150 Left sided colitis without complications: Secondary | ICD-10-CM

## 2021-04-10 DIAGNOSIS — F3342 Major depressive disorder, recurrent, in full remission: Secondary | ICD-10-CM

## 2021-04-10 DIAGNOSIS — E785 Hyperlipidemia, unspecified: Secondary | ICD-10-CM | POA: Diagnosis not present

## 2021-04-10 DIAGNOSIS — N1832 Chronic kidney disease, stage 3b: Secondary | ICD-10-CM | POA: Diagnosis not present

## 2021-04-10 DIAGNOSIS — F5101 Primary insomnia: Secondary | ICD-10-CM

## 2021-04-10 DIAGNOSIS — M797 Fibromyalgia: Secondary | ICD-10-CM

## 2021-04-10 LAB — BAYER DCA HB A1C WAIVED: HB A1C (BAYER DCA - WAIVED): 6.3 % — ABNORMAL HIGH (ref 4.8–5.6)

## 2021-04-10 MED ORDER — OXYCODONE HCL 5 MG PO TABS: 5.0000 mg | ORAL_TABLET | Freq: Two times a day (BID) | ORAL | 0 refills | Status: DC

## 2021-04-10 MED ORDER — METHOCARBAMOL 500 MG PO TABS: ORAL_TABLET | ORAL | 1 refills | Status: DC

## 2021-04-10 MED ORDER — ONDANSETRON HCL 4 MG PO TABS: 4.0000 mg | ORAL_TABLET | Freq: Three times a day (TID) | ORAL | 2 refills | Status: DC | PRN

## 2021-04-10 NOTE — Patient Instructions (Signed)

## 2021-04-10 NOTE — Addendum Note (Signed)
Addended by: [name] on: 04/10/2021 04:09 PM   Modules accepted: Orders

## 2021-04-10 NOTE — Progress Notes (Signed)
Subjective:    Patient ID: [name], female    DOB: Sep 05, 1940, 81 y.o.   MRN: [id number]  Chief Complaint: medical management of chronic issues     HPI:  [name] is a 81 y.o. who identifies as a female who was assigned female at birth.   Social history: Lives with: by herself Work history: retired   Scientist, forensic in today for follow up of the following chronic medical issues:  1. Essential hypertension No c/o chest pain, sob or headache. Does not check blood pressure at home. BP Readings from Last 3 Encounters:  01/10/21 106/62  09/30/20 111/70  07/30/20 136/85     2. Hyperlipidemia with target LDL less than 100 Does try to watch diet but does little to no exercise. Lab Results  Component Value Date   CHOL 114 01/10/2021   HDL 41 01/10/2021   LDLCALC 44 01/10/2021   TRIG 174 (H) 01/10/2021   CHOLHDL 2.8 01/10/2021     3. Type 2 diabetes mellitus with stage 3b chronic kidney disease, without long-term current use of insulin ([place]) Janumet was stopped at last visit due to diarrhea. She is now just on januvia. Blood sugars have been running under 160. Not sure about any low blood sugars. Lab Results  Component Value Date   HGBA1C 6.5 (H) 01/10/2021     4. Stage 3b chronic kidney disease ([place]) No voiding issues Lab Results  Component Value Date   CREATININE 1.72 (H) 01/10/2021     5. ULCERATIVE COLITIS, LEFT SIDED Has constant abdominal pain with frequent diarrhea. Lab Results  Component Value Date   HGBA1C 6.5 (H) 01/10/2021     6. Fibromyalgia Pain assessment: Cause of pain- fibromyalgia Pain location- varies from day to day Pain on scale of 1-10- 4/10 Frequency- daly What increases pain-nothing really that she has noticed What makes pain Better-rest Effects on ADL - none Any change in general medical condition-none  Current opioids rx- oxycodone 7m BID # meds rx- 60 Effectiveness of current meds-helps but always has some pain Adverse  reactions from pain meds-none Morphine equivalent- 15MME  Pill count performed-No Last drug screen - 10/24/19 ( high risk q376mmoderate risk q6m65mow risk yearly ) Urine drug screen today- Yes Was the NCCH[facility]viewed- yes  If yes were their any concerning findings? - no   Overdose risk: 1  Pain contract signed on: 10/03/20   7. Recurrent major depressive disorder, in full remission ([facility] on combination of buspar and lexapro. Works well for her.  8. Primary insomnia Is on trazadone and says she sleeps about 6-8 hours a night   New complaints: None today  Allergies  Allergen Reactions   Azathioprine Nausea And Vomiting and Other (See Comments)    SEVERE NAUSEA AND VOMITING WITH CHEST PAIN-  PATIENT INSISTS NEVER TO BE GIVEN ANYTHING SIMILAR TO THIS   Tape Other (See Comments) and Rash    BLISTERS AND SKIN TEARING   Codeine Nausea And Vomiting   Lac Bovis Nausea And Vomiting   Penicillins Hives    Has patient had a PCN reaction causing immediate rash, facial/tongue/throat swelling, SOB or lightheadedness with hypotension: No Has patient had a PCN reaction causing severe rash involving mucus membranes or skin necrosis: No Has patient had a PCN reaction that required hospitalization: No Has patient had a PCN reaction occurring within the last 10 years: No If all of the above answers are "NO", then may proceed with Cephalosporin use.  Povidone-Iodine Hives    BETADINE   Procaine Hcl Other (See Comments)    SEVERE GI UPSET (NAUSEA,VOMITING AND DIARRHEA)   Sulfonamide Derivatives Hives   Tramadol Itching   Acyclovir And Related    Azithromycin     Mycins    Clonazepam Other (See Comments)    Skin burning, insomnia, diarrhea.   Dairy Aid [Tilactase]    Doxycycline Rash   Latex Rash   Niacin And Related Rash   Outpatient Encounter Medications as of 04/10/2021  Medication Sig   amLODipine (NORVASC) 2.5 MG tablet Take 1 tablet (2.5 mg total) by mouth daily.    antiseptic oral rinse (BIOTENE) LIQD 1 application by Mouth Rinse route daily. FOR DRY MOUTH   benazepril-hydrochlorthiazide (LOTENSIN HCT) 20-12.5 MG tablet Take 2 tablets by mouth daily. TAKE (2) TABLETS  BY MOUTH ONCE DAILY.   blood glucose meter kit and supplies Dispense based on patient and insurance preference. Use up to four times daily as directed. (FOR ICD-10 E10.9, E11.9).   blood glucose meter kit and supplies Dispense based on patient and insurance preference. Use up to four times daily as directed. (FOR ICD-10 E10.9, E11.9).   Blood Glucose Monitoring Suppl (ONETOUCH VERIO FLEX SYSTEM) w/Device KIT USE AS DIRECTED WITH TESTING SUPPLIES UP TO 4 TIMES A DAY   busPIRone (BUSPAR) 10 MG tablet Take 1 tablet (10 mg total) by mouth 3 (three) times daily.   co-enzyme Q-10 50 MG capsule Take 50 mg by mouth once a week.   dicyclomine (BENTYL) 20 MG tablet Take 1/2-1 tablets (10-20 mg total) by mouth daily.   escitalopram (LEXAPRO) 20 MG tablet Take 1 tablet (20 mg total) by mouth daily.   glucose blood test strip Check blood sugar daily and as needed   hydrocortisone (ANUSOL-HC) 2.5 % rectal cream Place 1 application rectally 2 (two) times daily.   Lancets (ONETOUCH DELICA PLUS [id number]) MISC CHECK BLOOD SUGAR UP TO 4 TIMES A DAY OR AS DIRECTED   Loperamide HCl (IMODIUM PO) Take 1 tablet by mouth daily as needed (diarrhea).   methocarbamol (ROBAXIN) 500 MG tablet TAKE ONE TABLET BY MOUTH EVERY 6 HOURS AS NEEDED FOR MUSCLE SPASMS   Multiple Vitamins-Minerals (WOMENS MULTI VITAMIN & MINERAL PO) Take 1 tablet by mouth every other day.   Omega-3 Fatty Acids (FISH OIL) 1000 MG CPDR Take 3 capsules by mouth daily.    ondansetron (ZOFRAN) 4 MG tablet Take 1 tablet (4 mg total) by mouth every 8 (eight) hours as needed for nausea or vomiting.   oxyCODONE (ROXICODONE) 5 MG immediate release tablet Take 1 tablet (5 mg total) by mouth 2 (two) times daily.   oxyCODONE (ROXICODONE) 5 MG immediate release  tablet Take 1 tablet (5 mg total) by mouth 2 (two) times daily.   oxyCODONE (ROXICODONE) 5 MG immediate release tablet Take 1 tablet (5 mg total) by mouth 2 (two) times daily.   polyethylene glycol (MIRALAX / GLYCOLAX) 17 g packet Take 1 packet daily with 8 ounces of water as needed for constipation.   Probiotic Product (PROBIOTIC FORMULA PO) Take 1-2 capsules by mouth every morning.   rosuvastatin (CRESTOR) 10 MG tablet Take 1 tablet (10 mg total) by mouth daily.   shark liver oil-cocoa butter (PREPARATION H) 0.25-3-85.5 % suppository Place 1 suppository rectally as needed for hemorrhoids.   sitaGLIPtin (JANUVIA) 100 MG tablet Take 1 tablet (100 mg total) by mouth daily.   traZODone (DESYREL) 50 MG tablet Take 0.5-1 tablets (25-50 mg total) by  mouth at bedtime as needed for sleep.   vitamin E 400 UNIT capsule Take 400 Units by mouth every morning.   [DISCONTINUED] sodium chloride (OCEAN) 0.65 % SOLN nasal spray Place 2 sprays into the nose as needed for congestion.   No facility-administered encounter medications on file as of 04/10/2021.    Past Surgical History:  Procedure Laterality Date   ABDOMINAL HYSTERECTOMY     BIOPSY N/A 12/28/2014   Procedure: SIGMOID AND RECTAL BIOPSIES;  Surgeon: Rogene Houston, MD;  Location: [facility];  Service: Endoscopy;  Laterality: N/A;   CHOLECYSTECTOMY     s/p   COLONOSCOPY  06/16/06. 04/27/2007   proctitis seen, biopsies showed chronic active colitis, no dysplasia    EYE SURGERY     clear lense extraction 1998    FLEXIBLE SIGMOIDOSCOPY N/A 12/28/2014   Procedure: FLEXIBLE SIGMOIDOSCOPY WITH PROPOFOL;  Surgeon: Rogene Houston, MD;  Location: [facility];  Service: Endoscopy;  Laterality: N/A;    Family History  Problem Relation Age of Onset   Heart attack Mother 34       Died in her sleep   Diabetes Mother    Heart attack Father 48       Died in her sleep   Heart attack Brother 22       Defib, CABG   Diabetes Brother    Kidney disease Brother         dyalasis    Cancer Sister        liver, breast    Heart disease Sister          Review of Systems  Constitutional:  Negative for diaphoresis.  Eyes:  Negative for pain.  Respiratory:  Negative for shortness of breath.   Cardiovascular:  Negative for chest pain, palpitations and leg swelling.  Gastrointestinal:  Negative for abdominal pain.  Endocrine: Negative for polydipsia.  Skin:  Negative for rash.  Neurological:  Negative for dizziness, weakness and headaches.  Hematological:  Does not bruise/bleed easily.  All other systems reviewed and are negative.     Objective:   Physical Exam Vitals and nursing note reviewed.  Constitutional:      General: She is not in acute distress.    Appearance: Normal appearance. She is well-developed.  HENT:     Head: Normocephalic.     Right Ear: Tympanic membrane normal.     Left Ear: Tympanic membrane normal.     Nose: Nose normal.     Mouth/Throat:     Mouth: Mucous membranes are moist.  Eyes:     Pupils: Pupils are equal, round, and reactive to light.  Neck:     Vascular: No carotid bruit or JVD.  Cardiovascular:     Rate and Rhythm: Normal rate and regular rhythm.     Heart sounds: Normal heart sounds.  Pulmonary:     Effort: Pulmonary effort is normal. No respiratory distress.     Breath sounds: Normal breath sounds. No wheezing or rales.  Chest:     Chest wall: No tenderness.  Abdominal:     General: Bowel sounds are normal. There is no distension or abdominal bruit.     Palpations: Abdomen is soft. There is no hepatomegaly, splenomegaly, mass or pulsatile mass.     Tenderness: There is no abdominal tenderness.  Musculoskeletal:        General: Normal range of motion.     Cervical back: Normal range of motion and neck supple.  Lymphadenopathy:  Cervical: No cervical adenopathy.  Skin:    General: Skin is warm and dry.  Neurological:     Mental Status: She is alert and oriented to person, place, and time.      Deep Tendon Reflexes: Reflexes are normal and symmetric.  Psychiatric:        Behavior: Behavior normal.        Thought Content: Thought content normal.        Judgment: Judgment normal.    BP 116/63    Pulse 86    Temp (!) 97.5 F (36.4 C) (Temporal)    Resp 20    Ht _0  (1.753 m)    Wt 150 lb (68 kg)    SpO2 97%    BMI 22.15 kg/m   Hgba1c 6.3%     Assessment & Plan:   Curry Dulski comes in today with chief complaint of Medical Management of Chronic Issues   Diagnosis and orders addressed:  1. Essential hypertension Low sodium diet - CBC with Differential/Platelet - CMP14+EGFR  2. Hyperlipidemia with target LDL less than 100 Low fat diet - Lipid panel  3. Type 2 diabetes mellitus with stage 3b chronic kidney disease, without long-term current use of insulin (HCC) Continue to watch carbsin diet - Bayer DCA Hb A1c Waived  4. Stage 3b chronic kidney disease ([place]) Labs pending  5. ULCERATIVE COLITIS, LEFT SIDED - ondansetron (ZOFRAN) 4 MG tablet; Take 1 tablet (4 mg total) by mouth every 8 (eight) hours as needed for nausea or vomiting.  Dispense: 20 tablet; Refill: 2  6. Fibromyalgia Stay active  - oxyCODONE (ROXICODONE) 5 MG immediate release tablet; Take 1 tablet (5 mg total) by mouth 2 (two) times daily.  Dispense: 60 tablet; Refill: 0 - oxyCODONE (ROXICODONE) 5 MG immediate release tablet; Take 1 tablet (5 mg total) by mouth 2 (two) times daily.  Dispense: 60 tablet; Refill: 0 - oxyCODONE (ROXICODONE) 5 MG immediate release tablet; Take 1 tablet (5 mg total) by mouth 2 (two) times daily.  Dispense: 60 tablet; Refill: 0 - methocarbamol (ROBAXIN) 500 MG tablet; TAKE ONE TABLET BY MOUTH EVERY 6 HOURS AS NEEDED FOR MUSCLE SPASMS  Dispense: 30 tablet; Refill: 1  7. Recurrent major depressive disorder, in full remission ([place]) Stress management  8. Primary insomnia Bedtime routine   Labs pending Health Maintenance reviewed Diet and exercise encouraged  Follow up  plan: prn   Mary-Margaret Hassell Done, FNP

## 2021-04-11 LAB — CBC WITH DIFFERENTIAL/PLATELET
Basophils Absolute: 0 10*3/uL (ref 0.0–0.2)
Basos: 0 %
EOS (ABSOLUTE): 0.1 10*3/uL (ref 0.0–0.4)
Eos: 1 %
Hematocrit: 36.9 % (ref 34.0–46.6)
Hemoglobin: 12.4 g/dL (ref 11.1–15.9)
Immature Grans (Abs): 0 10*3/uL (ref 0.0–0.1)
Immature Granulocytes: 1 %
Lymphocytes Absolute: 1.6 10*3/uL (ref 0.7–3.1)
Lymphs: 22 %
MCH: 30.2 pg (ref 26.6–33.0)
MCHC: 33.6 g/dL (ref 31.5–35.7)
MCV: 90 fL (ref 79–97)
Monocytes Absolute: 0.6 10*3/uL (ref 0.1–0.9)
Monocytes: 9 %
Neutrophils Absolute: 5 10*3/uL (ref 1.4–7.0)
Neutrophils: 67 %
Platelets: 327 10*3/uL (ref 150–450)
RBC: 4.11 x10E6/uL (ref 3.77–5.28)
RDW: 13.5 % (ref 11.7–15.4)
WBC: 7.3 10*3/uL (ref 3.4–10.8)

## 2021-04-11 LAB — CMP14+EGFR
ALT: 13 IU/L (ref 0–32)
AST: 20 IU/L (ref 0–40)
Albumin/Globulin Ratio: 2 (ref 1.2–2.2)
Albumin: 4.5 g/dL (ref 3.7–4.7)
Alkaline Phosphatase: 51 IU/L (ref 44–121)
BUN/Creatinine Ratio: 14 (ref 12–28)
BUN: 18 mg/dL (ref 8–27)
Bilirubin Total: 0.8 mg/dL (ref 0.0–1.2)
CO2: 21 mmol/L (ref 20–29)
Calcium: 9.4 mg/dL (ref 8.7–10.3)
Chloride: 88 mmol/L — ABNORMAL LOW (ref 96–106)
Creatinine, Ser: 1.33 mg/dL — ABNORMAL HIGH (ref 0.57–1.00)
Globulin, Total: 2.2 g/dL (ref 1.5–4.5)
Glucose: 114 mg/dL — ABNORMAL HIGH (ref 70–99)
Potassium: 4.1 mmol/L (ref 3.5–5.2)
Sodium: 126 mmol/L — ABNORMAL LOW (ref 134–144)
Total Protein: 6.7 g/dL (ref 6.0–8.5)
eGFR: 41 mL/min/{1.73_m2} — ABNORMAL LOW (ref >59)

## 2021-04-11 LAB — LIPID PANEL
Chol/HDL Ratio: 2.7 ratio (ref 0.0–4.4)
Cholesterol, Total: 143 mg/dL (ref 100–199)
HDL: 53 mg/dL (ref >39)
LDL Chol Calc (NIH): 68 mg/dL (ref 0–99)
Triglycerides: 127 mg/dL (ref 0–149)
VLDL Cholesterol Cal: 22 mg/dL (ref 5–40)

## 2021-04-16 LAB — TOXASSURE SELECT 13 (MW), URINE

## 2021-05-03 ENCOUNTER — Other Ambulatory Visit: Payer: Self-pay | Admitting: Nurse Practitioner

## 2021-05-03 DIAGNOSIS — M797 Fibromyalgia: Secondary | ICD-10-CM

## 2021-05-05 ENCOUNTER — Other Ambulatory Visit: Payer: Self-pay | Admitting: Nurse Practitioner

## 2021-05-05 DIAGNOSIS — K5150 Left sided colitis without complications: Secondary | ICD-10-CM

## 2021-05-24 ENCOUNTER — Other Ambulatory Visit: Payer: Self-pay | Admitting: Nurse Practitioner

## 2021-05-24 DIAGNOSIS — F3342 Major depressive disorder, recurrent, in full remission: Secondary | ICD-10-CM

## 2021-07-05 ENCOUNTER — Other Ambulatory Visit: Payer: Self-pay | Admitting: Nurse Practitioner

## 2021-07-05 DIAGNOSIS — F3342 Major depressive disorder, recurrent, in full remission: Secondary | ICD-10-CM

## 2021-07-09 ENCOUNTER — Encounter: Payer: Self-pay | Admitting: Nurse Practitioner

## 2021-07-09 ENCOUNTER — Ambulatory Visit (INDEPENDENT_AMBULATORY_CARE_PROVIDER_SITE_OTHER): Payer: Medicare Other | Admitting: Nurse Practitioner

## 2021-07-09 VITALS — BP 138/83 | HR 70 | Temp 97.8°F | Resp 20 | Ht 69.0 in | Wt 149.0 lb

## 2021-07-09 DIAGNOSIS — E1122 Type 2 diabetes mellitus with diabetic chronic kidney disease: Secondary | ICD-10-CM

## 2021-07-09 DIAGNOSIS — F3342 Major depressive disorder, recurrent, in full remission: Secondary | ICD-10-CM

## 2021-07-09 DIAGNOSIS — I10 Essential (primary) hypertension: Secondary | ICD-10-CM

## 2021-07-09 DIAGNOSIS — F5101 Primary insomnia: Secondary | ICD-10-CM

## 2021-07-09 DIAGNOSIS — E785 Hyperlipidemia, unspecified: Secondary | ICD-10-CM

## 2021-07-09 DIAGNOSIS — N1832 Chronic kidney disease, stage 3b: Secondary | ICD-10-CM | POA: Diagnosis not present

## 2021-07-09 DIAGNOSIS — K5150 Left sided colitis without complications: Secondary | ICD-10-CM

## 2021-07-09 DIAGNOSIS — M797 Fibromyalgia: Secondary | ICD-10-CM

## 2021-07-09 LAB — BAYER DCA HB A1C WAIVED: HB A1C (BAYER DCA - WAIVED): 6.5 % — ABNORMAL HIGH (ref 4.8–5.6)

## 2021-07-09 MED ORDER — SITAGLIPTIN PHOSPHATE 100 MG PO TABS: 100.0000 mg | ORAL_TABLET | Freq: Every day | ORAL | 1 refills | Status: DC

## 2021-07-09 MED ORDER — ROSUVASTATIN CALCIUM 10 MG PO TABS: 10.0000 mg | ORAL_TABLET | Freq: Every day | ORAL | 1 refills | Status: DC

## 2021-07-09 MED ORDER — TRAZODONE HCL 50 MG PO TABS: 25.0000 mg | ORAL_TABLET | Freq: Every evening | ORAL | 1 refills | Status: DC | PRN

## 2021-07-09 MED ORDER — OXYCODONE HCL 5 MG PO TABS: 5.0000 mg | ORAL_TABLET | Freq: Two times a day (BID) | ORAL | 0 refills | Status: DC

## 2021-07-09 MED ORDER — ESCITALOPRAM OXALATE 20 MG PO TABS: 20.0000 mg | ORAL_TABLET | Freq: Every day | ORAL | 1 refills | Status: DC

## 2021-07-09 MED ORDER — DICYCLOMINE HCL 20 MG PO TABS: ORAL_TABLET | ORAL | 1 refills | Status: DC

## 2021-07-09 MED ORDER — AMLODIPINE BESYLATE 2.5 MG PO TABS: 2.5000 mg | ORAL_TABLET | Freq: Every day | ORAL | 1 refills | Status: DC

## 2021-07-09 MED ORDER — BENAZEPRIL-HYDROCHLOROTHIAZIDE 20-12.5 MG PO TABS: 2.0000 | ORAL_TABLET | Freq: Every day | ORAL | 3 refills | Status: DC

## 2021-07-09 MED ORDER — BUSPIRONE HCL 10 MG PO TABS: 10.0000 mg | ORAL_TABLET | Freq: Three times a day (TID) | ORAL | 2 refills | Status: DC

## 2021-07-09 NOTE — Progress Notes (Signed)
? ?Subjective:  ? ? Patient ID: [name], female    DOB: 01/25/1941, 81 y.o.   MRN: [id number] ? ? ?Chief Complaint: Medical Management of Chronic Issues ?  ? ?HPI: ? ?[name] is a 81 y.o. who identifies as a female who was assigned female at birth.  ? ?Social history: ?Lives with: by herself ?Work history: retired from [employer] in [place] ? ? ?Comes in today for follow up of the following chronic medical issues: ? ?1. Essential hypertension ?No c/o chest pain, sob or headache. Does not check blood pressure at home. ?BP Readings from Last 3 Encounters:  ?07/09/21 138/83  ?04/10/21 116/63  ?01/10/21 106/62  ? ? ? ?2. Hyperlipidemia with target LDL less than 100 ?She does watch diet but does no dedicated exercise. ?Lab Results  ?Component Value Date  ? CHOL 143 04/10/2021  ? HDL 53 04/10/2021  ? [place] 68 04/10/2021  ? TRIG 127 04/10/2021  ? CHOLHDL 2.7 04/10/2021  ? ? ? ?3. Type 2 diabetes mellitus with stage 3b chronic kidney disease, without long-term current use of insulin ([place]) ?Fasting blood sugars are running 100-140. She has not had any recent low blood sugars. ?Lab Results  ?Component Value Date  ? HGBA1C 6.3 (H) 04/10/2021  ? ? ? ?4. Stage 3b chronic kidney disease ([place]) ?No problems voiding ?Lab Results  ?Component Value Date  ? CREATININE 1.33 (H) 04/10/2021  ? ? ? ?5. ULCERATIVE COLITIS, LEFT SIDED ?6. Fibromyalgia ?Pain assessment: ?Cause of pain- ulcerative colitis and fibromyalgia ?Pain location- all over and abdomen ?Pain on scale of 1-10- 5/10 currently ?Frequency- daily ?What increases pain-not being able to go to the restroom ?What makes pain Better-normal bowel movements ?Effects on ADL - none ?Any change in general medical condition-none ? ?Current opioids rx- roxicodone 70m BID ?# meds rx- 673?Effectiveness of current meds-helsp ?Adverse reactions from pain meds-none ?Morphine equivalent- 15 MME ? ?Pill count performed-No ?Last drug screen - 04/10/21 ?( high risk q325mmoderate risk  q6m40mow risk yearly ) ?Urine drug screen today- No ?Was the NCCArmaviewed- yes ? If yes were their any concerning findings? - no ? ? ?Overdose risk: 1 ? ? ? ?Pain contract signed on:10/03/20 ? ? ?7. Recurrent major depressive disorder, in full remission ([place]Is on lexapro anad buspar. Is doing well. ? ?  07/09/2021  ?  2:49 PM 04/10/2021  ?  2:50 PM 01/10/2021  ?  3:23 PM  ?Depression screen PHQ 2/9  ?Decreased Interest 0 2 0  ?Down, Depressed, Hopeless 0 2 0  ?PHQ - 2 Score 0 4 0  ?Altered sleeping 0 2 1  ?Tired, decreased energy 0 2 0  ?Change in appetite 0 2 0  ?Feeling bad or failure about yourself  0 3 0  ?Trouble concentrating 0 0 0  ?Moving slowly or fidgety/restless 0 0 0  ?Suicidal thoughts 0 0 0  ?PHQ-9 Score 0 13 1  ?Difficult doing work/chores Not difficult at all Somewhat difficult Not difficult at all  ? ? ?  07/09/2021  ?  2:49 PM 04/10/2021  ?  2:50 PM 01/10/2021  ?  3:23 PM 09/30/2020  ?  4:16 PM  ?GAD 7 : Generalized Anxiety Score  ?Nervous, Anxious, on Edge 0 0 1 0  ?Control/stop worrying 0 1 1 0  ?Worry too much - different things 0 1 1 0  ?Trouble relaxing 0 1 1 1   ?Restless 0 0 0 0  ?Easily annoyed or irritable 0  0 0 0  ?Afraid - awful might happen 0 0 0 0  ?Total GAD 7 Score 0 3 4 1   ?Anxiety Difficulty Not difficult at all Not difficult at all Not difficult at all Not difficult at all  ? ? ? ? ?8. Primary insomnia ?Is on trazadone to sleep well. Sleeps about 7-8 hours ? ? ?New complaints: ?None today ? ?Allergies  ?Allergen Reactions  ? Azathioprine Nausea And Vomiting and Other (See Comments)  ?  SEVERE NAUSEA AND VOMITING WITH CHEST PAIN-  PATIENT INSISTS NEVER TO BE GIVEN ANYTHING SIMILAR TO THIS  ? Tape Other (See Comments) and Rash  ?  BLISTERS AND SKIN TEARING  ? Codeine Nausea And Vomiting  ? Lac Bovis Nausea And Vomiting  ? Penicillins Hives  ?  Has patient had a PCN reaction causing immediate rash, facial/tongue/throat swelling, SOB or lightheadedness with hypotension: No ?Has patient  had a PCN reaction causing severe rash involving mucus membranes or skin necrosis: No ?Has patient had a PCN reaction that required hospitalization: No ?Has patient had a PCN reaction occurring within the last 10 years: No ?If all of the above answers are "NO", then may proceed with Cephalosporin use. ?  ? Povidone-Iodine Hives  ?  BETADINE  ? Procaine Hcl Other (See Comments)  ?  SEVERE GI UPSET (NAUSEA,VOMITING AND DIARRHEA)  ? Sulfonamide Derivatives Hives  ? Tramadol Itching  ? Acyclovir And Related   ? Azithromycin   ?  Mycins   ? Clonazepam Other (See Comments)  ?  Skin burning, insomnia, diarrhea.  ? Dairy Aid [Tilactase]   ? Doxycycline Rash  ? Latex Rash  ? Niacin And Related Rash  ? ?Outpatient Encounter Medications as of 07/09/2021  ?Medication Sig  ? amLODipine (NORVASC) 2.5 MG tablet Take 1 tablet (2.5 mg total) by mouth daily.  ? antiseptic oral rinse (BIOTENE) LIQD 1 application by Mouth Rinse route daily. FOR DRY MOUTH  ? benazepril-hydrochlorthiazide (LOTENSIN HCT) 20-12.5 MG tablet Take 2 tablets by mouth daily. TAKE (2) TABLETS  BY MOUTH ONCE DAILY.  ? Blood Glucose Monitoring Suppl (ONETOUCH VERIO FLEX SYSTEM) w/Device KIT USE AS DIRECTED WITH TESTING SUPPLIES UP TO 4 TIMES A DAY  ? busPIRone (BUSPAR) 10 MG tablet TAKE ONE TABLET THREE TIMES DAILY  ? co-enzyme Q-10 50 MG capsule Take 50 mg by mouth once a week.  ? dicyclomine (BENTYL) 20 MG tablet Take 1/2-1 tablets (10-20 mg total) by mouth daily.  ? escitalopram (LEXAPRO) 20 MG tablet Take 1 tablet (20 mg total) by mouth daily.  ? glucose blood test strip Check blood sugar daily and as needed  ? hydrocortisone (ANUSOL-HC) 2.5 % rectal cream Place 1 application rectally 2 (two) times daily.  ? Lancets (ONETOUCH DELICA PLUS [id number]) MISC CHECK BLOOD SUGAR UP TO 4 TIMES A DAY OR AS DIRECTED  ? Loperamide HCl (IMODIUM PO) Take 1 tablet by mouth daily as needed (diarrhea).  ? methocarbamol (ROBAXIN) 500 MG tablet TAKE ONE TABLET EVERY 6 HOURS AS  NEEDED FOR MUSCLE SPASMS  ? Multiple Vitamins-Minerals (WOMENS MULTI VITAMIN & MINERAL PO) Take 1 tablet by mouth every other day.  ? Omega-3 Fatty Acids (FISH OIL) 1000 MG CPDR Take 3 capsules by mouth daily.   ? ondansetron (ZOFRAN) 4 MG tablet Take 1 tablet (4 mg total) by mouth every 8 (eight) hours as needed for nausea or vomiting.  ? oxyCODONE (ROXICODONE) 5 MG immediate release tablet Take 1 tablet (5 mg total) by mouth 2 (  two) times daily.  ? polyethylene glycol (MIRALAX / GLYCOLAX) 17 g packet Take 1 packet daily with 8 ounces of water as needed for constipation.  ? Probiotic Product (PROBIOTIC FORMULA PO) Take 1-2 capsules by mouth every morning.  ? rosuvastatin (CRESTOR) 10 MG tablet Take 1 tablet (10 mg total) by mouth daily.  ? shark liver oil-cocoa butter (PREPARATION H) 0.25-3-85.5 % suppository Place 1 suppository rectally as needed for hemorrhoids.  ? sitaGLIPtin (JANUVIA) 100 MG tablet Take 1 tablet (100 mg total) by mouth daily.  ? traZODone (DESYREL) 50 MG tablet Take 0.5-1 tablets (25-50 mg total) by mouth at bedtime as needed for sleep.  ? vitamin E 400 UNIT capsule Take 400 Units by mouth every morning.  ? oxyCODONE (ROXICODONE) 5 MG immediate release tablet Take 1 tablet (5 mg total) by mouth 2 (two) times daily.  ? oxyCODONE (ROXICODONE) 5 MG immediate release tablet Take 1 tablet (5 mg total) by mouth 2 (two) times daily.  ? [DISCONTINUED] sodium chloride (OCEAN) 0.65 % SOLN nasal spray Place 2 sprays into the nose as needed for congestion.  ? ?No facility-administered encounter medications on file as of 07/09/2021.  ? ? ?Past Surgical History:  ?Procedure Laterality Date  ? ABDOMINAL HYSTERECTOMY    ? BIOPSY N/A 12/28/2014  ? Procedure: SIGMOID AND RECTAL BIOPSIES;  Surgeon: Rogene Houston, MD;  Location: [facility];  Service: Endoscopy;  Laterality: N/A;  ? CHOLECYSTECTOMY    ? s/p  ? COLONOSCOPY  06/16/06. 04/27/2007  ? proctitis seen, biopsies showed chronic active colitis, no dysplasia   ?  EYE SURGERY    ? clear lense extraction 1998   ? FLEXIBLE SIGMOIDOSCOPY N/A 12/28/2014  ? Procedure: FLEXIBLE SIGMOIDOSCOPY WITH PROPOFOL;  Surgeon: Rogene Houston, MD;  Location: [facility];  Service: Endo

## 2021-07-09 NOTE — Patient Instructions (Signed)
Stress, Adult ?Stress is a normal reaction to life events. Stress is what you feel when life demands more than you are used to, or more than you think you can handle. ?Some stress can be useful, such as studying for a test or meeting a deadline at work. Stress that occurs too often or for too long can cause problems. Long-lasting stress is called chronic stress. Chronic stress can affect your emotional health and interfere with relationships and normal daily activities. ?Too much stress can weaken your body's defense system (immune system) and increase your risk for physical illness. If you already have a medical problem, stress can make it worse. ?What are the causes? ?All sorts of life events can cause stress. An event that causes stress for one person may not be stressful for someone else. Major life events, whether positive or negative, commonly cause stress. Examples include: ?Losing a job or starting a new job. ?Losing a loved one. ?Moving to a new town or home. ?Getting married or divorced. ?Having a baby. ?Getting injured or sick. ?Less obvious life events can also cause stress, especially if they occur day after day or in combination with each other. Examples include: ?Working long hours. ?Driving in traffic. ?Caring for children. ?Being in debt. ?Being in a difficult relationship. ?What are the signs or symptoms? ?Stress can cause emotional and physical symptoms and can lead to unhealthy behaviors. These include the following: ?Emotional symptoms ?Anxiety. This is feeling worried, afraid, on edge, overwhelmed, or out of control. ?Anger, including irritation or impatience. ?Depression. This is feeling sad, down, helpless, or guilty. ?Trouble focusing, remembering, or making decisions. ?Physical symptoms ?Aches and pains. These may affect your head, neck, back, stomach, or other areas of your body. ?Tight muscles or a clenched jaw. ?Low energy. ?Trouble sleeping. ?Unhealthy behaviors ?Eating to feel better  (overeating) or skipping meals. ?Working too much or putting off tasks. ?Smoking, drinking alcohol, or using drugs to feel better. ?How is this diagnosed? ?A stress disorder is diagnosed through an assessment by your health care provider. A stress disorder may be diagnosed based on: ?Your symptoms and any stressful life events. ?Your medical history. ?Tests to rule out other causes of your symptoms. ?Depending on your condition, your health care provider may refer you to a specialist for further evaluation. ?How is this treated? ? ?Stress management techniques are the recommended treatment for stress. Medicine is not typically recommended for treating stress. ?Techniques to reduce your reaction to stressful life events include: ?Identifying stress. Monitor yourself for symptoms of stress and notice what causes stress for you. These skills may help you to avoid or prepare for stressful events. ?Managing time. Set your priorities, keep a calendar of events, and learn to say no. These actions can help you avoid taking on too much. ?Techniques for dealing with stress include: ?Rethinking the problem. Try to think realistically about stressful events rather than ignoring them or overreacting. Try to find the positives in a stressful situation rather than focusing on the negatives. ?Exercise. Physical exercise can release both physical and emotional tension. The key is to find a form of exercise that you enjoy and do it regularly. ?Relaxation techniques. These relax the body and mind. Find one or more that you enjoy and use the techniques regularly. Examples include: ?Meditation, deep breathing, or progressive relaxation techniques. ?Yoga or tai chi. ?Biofeedback, mindfulness techniques, or journaling. ?Listening to music, being in nature, or taking part in other hobbies. ?Practicing a healthy lifestyle. Eat a balanced  diet, drink plenty of water, limit or avoid caffeine, and get plenty of sleep. ?Having a strong support  network. Spend time with family, friends, or other people you enjoy being around. Express your feelings and talk things over with someone you trust. ?Counseling or talk therapy with a mental health provider may help if you are having trouble managing stress by yourself. ?Follow these instructions at home: ?Lifestyle ? ?Avoid drugs. ?Do not use any products that contain nicotine or tobacco. These products include cigarettes, chewing tobacco, and vaping devices, such as e-cigarettes. If you need help quitting, ask your health care provider. ?If you drink alcohol: ?Limit how much you have to: ?0-1 drink a day for women who are not pregnant. ?0-2 drinks a day for men. ?Know how much alcohol is in a drink. In the [place] one drink equals one 12 oz bottle of beer (355 mL), one 5 oz glass of wine (148 mL), or one 1? oz glass of hard liquor (44 mL). ?Do not use alcohol or drugs to relax. ?Eat a balanced diet that includes fresh fruits and vegetables, whole grains, lean meats, fish, eggs, beans, and low-fat dairy. Avoid processed foods and foods high in added fat, sugar, and salt. ?Exercise at least 30 minutes on 5 or more days each week. ?Get 7-8 hours of sleep each night. ?General instructions ? ?Practice stress management techniques as told by your health care provider. ?Drink enough fluid to keep your urine pale yellow. ?Take over-the-counter and prescription medicines only as told by your health care provider. ?Keep all follow-up visits. This is important. ?Contact a health care provider if: ?Your symptoms get worse. ?You have new symptoms. ?You feel overwhelmed by your problems and can no longer manage them by yourself. ?Get help right away if: ?You have thoughts of hurting yourself or others. ?Get help right awayif you feel like you may hurt yourself or others, or have thoughts about taking your own life. Go to your nearest emergency room or: ?Call 911. ?Call the [facility] at [phone] or  988. This is open 24 hours a day. ?Text the Crisis Text Line at 440-443-4489. ?Summary ?Stress is a normal reaction to life events. It can cause problems if it happens too often or for too long. ?Practicing stress management techniques is the best way to treat stress. ?Counseling or talk therapy with a mental health provider may help if you are having trouble managing stress by yourself. ?This information is not intended to replace advice given to you by your health care provider. Make sure you discuss any questions you have with your health care provider. ?Document Revised: 10/17/2020 Document Reviewed: 10/17/2020 ?Elsevier Patient Education ? [place]. ? ?

## 2021-07-10 LAB — CMP14+EGFR
ALT: 12 IU/L (ref 0–32)
AST: 21 IU/L (ref 0–40)
Albumin/Globulin Ratio: 2.4 — ABNORMAL HIGH (ref 1.2–2.2)
Albumin: 4.6 g/dL (ref 3.7–4.7)
Alkaline Phosphatase: 56 IU/L (ref 44–121)
BUN/Creatinine Ratio: 14 (ref 12–28)
BUN: 18 mg/dL (ref 8–27)
Bilirubin Total: 0.7 mg/dL (ref 0.0–1.2)
CO2: 23 mmol/L (ref 20–29)
Calcium: 9.3 mg/dL (ref 8.7–10.3)
Chloride: 93 mmol/L — ABNORMAL LOW (ref 96–106)
Creatinine, Ser: 1.25 mg/dL — ABNORMAL HIGH (ref 0.57–1.00)
Globulin, Total: 1.9 g/dL (ref 1.5–4.5)
Glucose: 142 mg/dL — ABNORMAL HIGH (ref 70–99)
Potassium: 3.5 mmol/L (ref 3.5–5.2)
Sodium: 133 mmol/L — ABNORMAL LOW (ref 134–144)
Total Protein: 6.5 g/dL (ref 6.0–8.5)
eGFR: 44 mL/min/{1.73_m2} — ABNORMAL LOW (ref >59)

## 2021-07-10 LAB — CBC WITH DIFFERENTIAL/PLATELET
Basophils Absolute: 0.1 10*3/uL (ref 0.0–0.2)
Basos: 1 %
EOS (ABSOLUTE): 0.1 10*3/uL (ref 0.0–0.4)
Eos: 1 %
Hematocrit: 39.6 % (ref 34.0–46.6)
Hemoglobin: 12.9 g/dL (ref 11.1–15.9)
Immature Grans (Abs): 0 10*3/uL (ref 0.0–0.1)
Immature Granulocytes: 0 %
Lymphocytes Absolute: 1.2 10*3/uL (ref 0.7–3.1)
Lymphs: 17 %
MCH: 30 pg (ref 26.6–33.0)
MCHC: 32.6 g/dL (ref 31.5–35.7)
MCV: 92 fL (ref 79–97)
Monocytes Absolute: 0.5 10*3/uL (ref 0.1–0.9)
Monocytes: 6 %
Neutrophils Absolute: 5.6 10*3/uL (ref 1.4–7.0)
Neutrophils: 75 %
Platelets: 316 10*3/uL (ref 150–450)
RBC: 4.3 x10E6/uL (ref 3.77–5.28)
RDW: 12.5 % (ref 11.7–15.4)
WBC: 7.4 10*3/uL (ref 3.4–10.8)

## 2021-07-10 LAB — LIPID PANEL
Chol/HDL Ratio: 2.8 ratio (ref 0.0–4.4)
Cholesterol, Total: 154 mg/dL (ref 100–199)
HDL: 55 mg/dL (ref >39)
LDL Chol Calc (NIH): 77 mg/dL (ref 0–99)
Triglycerides: 128 mg/dL (ref 0–149)
VLDL Cholesterol Cal: 22 mg/dL (ref 5–40)

## 2021-07-10 LAB — MICROALBUMIN / CREATININE URINE RATIO
Creatinine, Urine: 166.9 mg/dL
Microalb/Creat Ratio: 10 mg/g creat (ref 0–29)
Microalbumin, Urine: 16.4 ug/mL

## 2021-08-07 ENCOUNTER — Telehealth: Payer: Self-pay | Admitting: Nurse Practitioner

## 2021-08-07 DIAGNOSIS — M797 Fibromyalgia: Secondary | ICD-10-CM

## 2021-08-07 MED ORDER — OXYCODONE HCL 5 MG PO TABS: 5.0000 mg | ORAL_TABLET | Freq: Two times a day (BID) | ORAL | 0 refills | Status: DC

## 2021-08-07 NOTE — Telephone Encounter (Signed)
Roxi sent to [facility] pharmacy

## 2021-08-29 ENCOUNTER — Other Ambulatory Visit: Payer: Self-pay | Admitting: Nurse Practitioner

## 2021-08-29 DIAGNOSIS — Z1231 Encounter for screening mammogram for malignant neoplasm of breast: Secondary | ICD-10-CM

## 2021-09-04 ENCOUNTER — Other Ambulatory Visit: Payer: Self-pay | Admitting: Nurse Practitioner

## 2021-09-04 DIAGNOSIS — M797 Fibromyalgia: Secondary | ICD-10-CM

## 2021-09-05 NOTE — Telephone Encounter (Signed)
Patient aware. She states she has a prescription at the pharmacy.

## 2021-09-05 NOTE — Telephone Encounter (Addendum)
Needs face to face. Schedule on 10/09/2021

## 2021-09-05 NOTE — Telephone Encounter (Signed)
Last OV 07/09/2021. Last RF 08/08/2021. Next OV 10/09/2021

## 2021-09-06 ENCOUNTER — Other Ambulatory Visit: Payer: Self-pay | Admitting: Nurse Practitioner

## 2021-09-06 DIAGNOSIS — M797 Fibromyalgia: Secondary | ICD-10-CM

## 2021-09-08 NOTE — Telephone Encounter (Signed)
[facility] patient - controlled substance please deny

## 2021-09-15 ENCOUNTER — Ambulatory Visit
Admission: RE | Admit: 2021-09-15 | Discharge: 2021-09-15 | Disposition: A | Discharge status: Home / Self Care | Payer: Medicare Other | Source: Ambulatory Visit | Attending: Nurse Practitioner | Admitting: Nurse Practitioner

## 2021-09-15 DIAGNOSIS — Z1231 Encounter for screening mammogram for malignant neoplasm of breast: Secondary | ICD-10-CM

## 2021-09-17 ENCOUNTER — Telehealth: Payer: Self-pay | Admitting: Nurse Practitioner

## 2021-09-17 NOTE — Telephone Encounter (Signed)
Left message for patient to call back and schedule Medicare Annual Wellness Visit (AWV) to be completed by video or phone.   Last AWV: 09/27/2020  Please schedule at anytime with [place]     45 minute appointment  Any questions, please contact me at [phone]

## 2021-09-22 ENCOUNTER — Other Ambulatory Visit: Payer: Self-pay | Admitting: Nurse Practitioner

## 2021-09-22 DIAGNOSIS — M797 Fibromyalgia: Secondary | ICD-10-CM

## 2021-09-24 ENCOUNTER — Other Ambulatory Visit: Payer: Self-pay | Admitting: Nurse Practitioner

## 2021-09-24 DIAGNOSIS — M797 Fibromyalgia: Secondary | ICD-10-CM

## 2021-09-24 DIAGNOSIS — K5903 Drug induced constipation: Secondary | ICD-10-CM

## 2021-09-25 ENCOUNTER — Telehealth: Payer: Self-pay | Admitting: Nurse Practitioner

## 2021-09-25 DIAGNOSIS — M797 Fibromyalgia: Secondary | ICD-10-CM

## 2021-09-25 MED ORDER — OXYCODONE HCL 5 MG PO TABS: 5.0000 mg | ORAL_TABLET | Freq: Two times a day (BID) | ORAL | 0 refills | Status: DC

## 2021-09-25 NOTE — Telephone Encounter (Signed)
Patient needs her oxyCODONE (ROXICODONE) 5 MG immediate release tablet sent to [facility] because [facility] does not have any in stock. Please call back and advise.

## 2021-10-03 ENCOUNTER — Telehealth: Payer: Self-pay | Admitting: *Deleted

## 2021-10-03 DIAGNOSIS — L03012 Cellulitis of left finger: Secondary | ICD-10-CM | POA: Diagnosis not present

## 2021-10-03 NOTE — Telephone Encounter (Signed)
Pt called in to get an antibiotic, she cut her finger a week ago, today it started swelling & has puss. We unfortunately can not prescribe abx without seeing her and we do not have any appts left at this time of day.Instructed pt she needed to go to the Urgent Care to be seen since she is a diabetic and this can not wait till her appt next Thurs w/ her PCP her.

## 2021-10-09 ENCOUNTER — Encounter: Payer: Self-pay | Admitting: Nurse Practitioner

## 2021-10-09 ENCOUNTER — Ambulatory Visit: Payer: Medicare Other | Admitting: Nurse Practitioner

## 2021-10-09 VITALS — BP 160/78 | HR 74 | Temp 97.7°F | Resp 20 | Ht 69.0 in | Wt 158.0 lb

## 2021-10-09 DIAGNOSIS — E785 Hyperlipidemia, unspecified: Secondary | ICD-10-CM | POA: Diagnosis not present

## 2021-10-09 DIAGNOSIS — R001 Bradycardia, unspecified: Secondary | ICD-10-CM

## 2021-10-09 DIAGNOSIS — E1122 Type 2 diabetes mellitus with diabetic chronic kidney disease: Secondary | ICD-10-CM

## 2021-10-09 DIAGNOSIS — I10 Essential (primary) hypertension: Secondary | ICD-10-CM | POA: Diagnosis not present

## 2021-10-09 DIAGNOSIS — F3342 Major depressive disorder, recurrent, in full remission: Secondary | ICD-10-CM

## 2021-10-09 DIAGNOSIS — M797 Fibromyalgia: Secondary | ICD-10-CM

## 2021-10-09 DIAGNOSIS — R252 Cramp and spasm: Secondary | ICD-10-CM

## 2021-10-09 DIAGNOSIS — K5150 Left sided colitis without complications: Secondary | ICD-10-CM

## 2021-10-09 DIAGNOSIS — F5101 Primary insomnia: Secondary | ICD-10-CM

## 2021-10-09 DIAGNOSIS — N1832 Chronic kidney disease, stage 3b: Secondary | ICD-10-CM

## 2021-10-09 LAB — BAYER DCA HB A1C WAIVED: HB A1C (BAYER DCA - WAIVED): 6.2 % — ABNORMAL HIGH (ref 4.8–5.6)

## 2021-10-09 MED ORDER — DICYCLOMINE HCL 20 MG PO TABS: ORAL_TABLET | ORAL | 1 refills | Status: DC

## 2021-10-09 MED ORDER — BUSPIRONE HCL 10 MG PO TABS: 10.0000 mg | ORAL_TABLET | Freq: Three times a day (TID) | ORAL | 2 refills | Status: DC

## 2021-10-09 MED ORDER — OXYCODONE HCL 5 MG PO TABS: 5.0000 mg | ORAL_TABLET | Freq: Two times a day (BID) | ORAL | 0 refills | Status: DC

## 2021-10-09 MED ORDER — ESCITALOPRAM OXALATE 20 MG PO TABS: 20.0000 mg | ORAL_TABLET | Freq: Every day | ORAL | 1 refills | Status: DC

## 2021-10-09 MED ORDER — METHOCARBAMOL 500 MG PO TABS: ORAL_TABLET | ORAL | 1 refills | Status: DC

## 2021-10-09 MED ORDER — TRAZODONE HCL 50 MG PO TABS: 25.0000 mg | ORAL_TABLET | Freq: Every evening | ORAL | 1 refills | Status: DC | PRN

## 2021-10-09 MED ORDER — ROSUVASTATIN CALCIUM 10 MG PO TABS: 10.0000 mg | ORAL_TABLET | Freq: Every day | ORAL | 1 refills | Status: DC

## 2021-10-09 MED ORDER — SITAGLIPTIN PHOSPHATE 100 MG PO TABS: 100.0000 mg | ORAL_TABLET | Freq: Every day | ORAL | 1 refills | Status: DC

## 2021-10-09 NOTE — Progress Notes (Signed)
Subjective:    Patient ID: [name], female    DOB: 09-16-1940, 81 y.o.   MRN: [id number]   Chief Complaint: medical management of chronic issues     HPI:  [name] is a 81 y.o. who identifies as a female who was assigned female at birth.   Social history: Lives with: by herself- family check son her daily Work history: retired   Scientist, forensic in today for follow up of the following chronic medical issues:  1. Essential hypertension No c/o chest pain, sob or headache. Does not check blood pressure at home. BP Readings from Last 3 Encounters:  07/09/21 138/83  04/10/21 116/63  01/10/21 106/62     2. Hyperlipidemia with target LDL less than 100 Doe snot really watch diet , but does not have a good appetite either. Lab Results  Component Value Date   CHOL 154 07/09/2021   HDL 55 07/09/2021   LDLCALC 77 07/09/2021   TRIG 128 07/09/2021   CHOLHDL 2.8 07/09/2021     3. Type 2 diabetes mellitus with stage 3b chronic kidney disease, without long-term current use of insulin (HCC) Fasting blood sugars 110 this morning. No low blood sugars Lab Results  Component Value Date   HGBA1C 6.5 (H) 07/09/2021     4. Stage 3b chronic kidney disease (HCC) No voiding issues Lab Results  Component Value Date   CREATININE 1.25 (H) 07/09/2021     5. ULCERATIVE COLITIS, LEFT SIDED Has been doing fairly well/ still having frequent diarrhea.  6. Fibromyalgia Pain assessment: Cause of pain- fibromyalgia and ulcerative colitis Pain location- muscle that vary and abdominal pain Pain on scale of 1-10- 8-10/10 today- cramps in lower ext. Frequency- daily What increases pain-flare up of ulcerative colitis really aggravates pain threshold What makes pain Better-rest and diet Effects on ADL - none Any change in general medical condition-none  Current opioids rx- roxicodone 14m bid # meds rx- 60 Effectiveness of current meds-helps Adverse reactions from pain meds-none Morphine  equivalent- 15MME  Pill count performed-No Last drug screen - 04/10/21 ( high risk q347mmoderate risk [id number] risk yearly ) Urine drug screen today- No Was [place]- yes  If yes were their any concerning findings? - no   Overdose risk: 1   Pain contract signed on: 10/09/21   7. Recurrent major depressive disorder, in full remission ([facility] [place] on lexapro  daly and buspar as needed. She does not really like buspar, but will not give her xanax or valium due to taking pain medication  8. Bradycardia Denies dizziness, syncopal and near syncopal eppisodes. Pulse Readings from Last 3 Encounters:  07/09/21 70  04/10/21 86  01/10/21 79     9. Primary insomnia Is on trazadone to sleep at nighgt. Is doing wel. Sleeps about 7-8 hours   New complaints: None today  Allergies  Allergen Reactions   Azathioprine Nausea And Vomiting and Other (See Comments)    SEVERE NAUSEA AND VOMITING WITH CHEST PAIN-  PATIENT INSISTS NEVER TO BE GIVEN ANYTHING SIMILAR TO THIS   Tape Other (See Comments) and Rash    BLISTERS AND SKIN TEARING   Codeine Nausea And Vomiting   Milk (Cow) Nausea And Vomiting   Penicillins Hives    Has patient had a PCN reaction causing immediate rash, facial/tongue/throat swelling, SOB or lightheadedness with hypotension: No Has patient had a PCN reaction causing severe rash involving mucus membranes or skin necrosis: No Has patient had a PCN reaction that  required hospitalization: No Has patient had a PCN reaction occurring within the last 10 years: No If all of the above answers are "NO", then may proceed with Cephalosporin use.    Povidone-Iodine Hives    BETADINE   Procaine Hcl Other (See Comments)    SEVERE GI UPSET (NAUSEA,VOMITING AND DIARRHEA)   Sulfonamide Derivatives Hives   Tramadol Itching   Acyclovir And Related    Azithromycin     Mycins    Clonazepam Other (See Comments)    Skin burning, insomnia, diarrhea.   Dairy Aid [Tilactase]     Doxycycline Rash   Latex Rash   Niacin And Related Rash   Outpatient Encounter Medications as of 10/09/2021  Medication Sig   amLODipine (NORVASC) 2.5 MG tablet Take 1 tablet (2.5 mg total) by mouth daily.   antiseptic oral rinse (BIOTENE) LIQD 1 application by Mouth Rinse route daily. FOR DRY MOUTH   benazepril-hydrochlorthiazide (LOTENSIN HCT) 20-12.5 MG tablet Take 2 tablets by mouth daily. TAKE (2) TABLETS  BY MOUTH ONCE DAILY.   Blood Glucose Monitoring Suppl (ONETOUCH VERIO FLEX SYSTEM) w/Device KIT USE AS DIRECTED WITH TESTING SUPPLIES UP TO 4 TIMES A DAY   busPIRone (BUSPAR) 10 MG tablet Take 1 tablet (10 mg total) by mouth 3 (three) times daily.   co-enzyme Q-10 50 MG capsule Take 50 mg by mouth once a week.   dicyclomine (BENTYL) 20 MG tablet Take 1/2-1 tablets (10-20 mg total) by mouth daily.   escitalopram (LEXAPRO) 20 MG tablet Take 1 tablet (20 mg total) by mouth daily.   glucose blood (ONETOUCH VERIO) test strip Test BS up to 4 times daily or as directed Dx E11.22   hydrocortisone (ANUSOL-HC) 2.5 % rectal cream PLACE 1 APPLICATION RECTALLY 2 TIMES A DAY   Lancets (ONETOUCH DELICA PLUS LOVFIE33I) MISC CHECK BLOOD SUGAR UP TO 4 TIMES A DAY OR AS DIRECTED   Loperamide HCl (IMODIUM PO) Take 1 tablet by mouth daily as needed (diarrhea).   methocarbamol (ROBAXIN) 500 MG tablet TAKE ONE TABLET EVERY 6 HOURS AS NEEDED FOR MUSCLE SPASMS   Multiple Vitamins-Minerals (WOMENS MULTI VITAMIN & MINERAL PO) Take 1 tablet by mouth every other day.   Omega-3 Fatty Acids (FISH OIL) 1000 MG CPDR Take 3 capsules by mouth daily.    ondansetron (ZOFRAN) 4 MG tablet Take 1 tablet (4 mg total) by mouth every 8 (eight) hours as needed for nausea or vomiting.   oxyCODONE (ROXICODONE) 5 MG immediate release tablet Take 1 tablet (5 mg total) by mouth 2 (two) times daily.   oxyCODONE (ROXICODONE) 5 MG immediate release tablet Take 1 tablet (5 mg total) by mouth 2 (two) times daily.   oxyCODONE (ROXICODONE) 5  MG immediate release tablet Take 1 tablet (5 mg total) by mouth 2 (two) times daily.   polyethylene glycol (MIRALAX / GLYCOLAX) 17 g packet Take 1 packet daily with 8 ounces of water as needed for constipation.   Probiotic Product (PROBIOTIC FORMULA PO) Take 1-2 capsules by mouth every morning.   rosuvastatin (CRESTOR) 10 MG tablet Take 1 tablet (10 mg total) by mouth daily.   shark liver oil-cocoa butter (PREPARATION H) 0.25-3-85.5 % suppository Place 1 suppository rectally as needed for hemorrhoids.   sitaGLIPtin (JANUVIA) 100 MG tablet Take 1 tablet (100 mg total) by mouth daily.   traZODone (DESYREL) 50 MG tablet Take 0.5-1 tablets (25-50 mg total) by mouth at bedtime as needed for sleep.   vitamin E 400 UNIT capsule Take 400  Units by mouth every morning.   [DISCONTINUED] sodium chloride (OCEAN) 0.65 % SOLN nasal spray Place 2 sprays into the nose as needed for congestion.   No facility-administered encounter medications on file as of 10/09/2021.    Past Surgical History:  Procedure Laterality Date   ABDOMINAL HYSTERECTOMY     BIOPSY N/A 12/28/2014   Procedure: SIGMOID AND RECTAL BIOPSIES;  Surgeon: Rogene Houston, MD;  Location: [facility];  Service: Endoscopy;  Laterality: N/A;   CHOLECYSTECTOMY     s/p   COLONOSCOPY  06/16/06. 04/27/2007   proctitis seen, biopsies showed chronic active colitis, no dysplasia    EYE SURGERY     clear lense extraction 1998    FLEXIBLE SIGMOIDOSCOPY N/A 12/28/2014   Procedure: FLEXIBLE SIGMOIDOSCOPY WITH PROPOFOL;  Surgeon: Rogene Houston, MD;  Location: [facility];  Service: Endoscopy;  Laterality: N/A;    Family History  Problem Relation Age of Onset   Heart attack Mother 52       Died in her sleep   Diabetes Mother    Heart attack Father 38       Died in her sleep   Cancer Sister        liver, breast    Heart disease Sister    Breast cancer Maternal Aunt    Breast cancer Maternal Aunt    Heart attack Brother 59       Defib, CABG   Diabetes  Brother    Kidney disease Brother        dyalasis            Review of Systems  Constitutional:  Negative for diaphoresis.  Eyes:  Negative for pain.  Respiratory:  Negative for shortness of breath.   Cardiovascular:  Negative for chest pain, palpitations and leg swelling.  Gastrointestinal:  Negative for abdominal pain.  Endocrine: Negative for polydipsia.  Skin:  Negative for rash.  Neurological:  Negative for dizziness, weakness and headaches.  Hematological:  Does not bruise/bleed easily.  All other systems reviewed and are negative.      Objective:   Physical Exam Vitals and nursing note reviewed.  Constitutional:      General: She is not in acute distress.    Appearance: Normal appearance. She is well-developed.  HENT:     Head: Normocephalic.     Right Ear: Tympanic membrane normal.     Left Ear: Tympanic membrane normal.     Nose: Nose normal.     Mouth/Throat:     Mouth: Mucous membranes are moist.  Eyes:     Pupils: Pupils are equal, round, and reactive to light.  Neck:     Vascular: No carotid bruit or JVD.  Cardiovascular:     Rate and Rhythm: Normal rate and regular rhythm.     Heart sounds: Normal heart sounds.  Pulmonary:     Effort: Pulmonary effort is normal. No respiratory distress.     Breath sounds: Normal breath sounds. No wheezing or rales.  Chest:     Chest wall: No tenderness.  Abdominal:     General: Bowel sounds are normal. There is no distension or abdominal bruit.     Palpations: Abdomen is soft. There is no hepatomegaly, splenomegaly, mass or pulsatile mass.     Tenderness: There is no abdominal tenderness.  Musculoskeletal:        General: Normal range of motion.     Cervical back: Normal range of motion and neck supple.  Comments: Having muscle cramps in bil lower ext  Lymphadenopathy:     Cervical: No cervical adenopathy.  Skin:    General: Skin is warm and dry.  Neurological:     Mental Status: She is alert and  oriented to person, place, and time.     Deep Tendon Reflexes: Reflexes are normal and symmetric.  Psychiatric:        Behavior: Behavior normal.        Thought Content: Thought content normal.        Judgment: Judgment normal.   BP (!) 188/104   Pulse 74   Temp 97.7 F (36.5 C) (Temporal)   Resp 20   Ht 5' 9"  (1.753 m)   Wt 158 lb (71.7 kg)   SpO2 100%   BMI 23.33 kg/m      Hgab1c 6.2%    Assessment & Plan:   Phyllicia Dudek comes in today with chief complaint of Medical Management of Chronic Issues   Diagnosis and orders addressed:  1. Essential hypertension Low sodium diet - CBC with Differential/Platelet - CMP14+EGFR - CBC with Differential/Platelet - CMP14+EGFR  2. Hyperlipidemia with target LDL less than 100 Low fat diet - Lipid panel - Lipid panel  3. Type 2 diabetes mellitus with stage 3b chronic kidney disease, without long-term current use of insulin (HCC) Continue to watch carbs in diet - Bayer DCA Hb A1c Waived - sitaGLIPtin (JANUVIA) 100 MG tablet; Take 1 tablet (100 mg total) by mouth daily.  Dispense: 90 tablet; Refill: 1  4. Stage 3b chronic kidney disease ([place]) Labs pending  5. ULCERATIVE COLITIS, LEFT SIDED Watch diet to prevent flare up - rosuvastatin (CRESTOR) 10 MG tablet; Take 1 tablet (10 mg total) by mouth daily.  Dispense: 90 tablet; Refill: 1 - dicyclomine (BENTYL) 20 MG tablet; Take 1/2-1 tablets (10-20 mg total) by mouth daily.  Dispense: 90 tablet; Refill: 1  6. Fibromyalgia Heat stay active - oxyCODONE (ROXICODONE) 5 MG immediate release tablet; Take 1 tablet (5 mg total) by mouth 2 (two) times daily.  Dispense: 60 tablet; Refill: 0 - oxyCODONE (ROXICODONE) 5 MG immediate release tablet; Take 1 tablet (5 mg total) by mouth 2 (two) times daily.  Dispense: 60 tablet; Refill: 0 - oxyCODONE (ROXICODONE) 5 MG immediate release tablet; Take 1 tablet (5 mg total) by mouth 2 (two) times daily.  Dispense: 60 tablet; Refill: 0 -  methocarbamol (ROBAXIN) 500 MG tablet; TAKE ONE TABLET EVERY 6 HOURS AS NEEDED FOR MUSCLE SPASMS  Dispense: 30 tablet; Refill: 1  7. Recurrent major depressive disorder, in full remission ([place]) Stress management - escitalopram (LEXAPRO) 20 MG tablet; Take 1 tablet (20 mg total) by mouth daily.  Dispense: 90 tablet; Refill: 1 - busPIRone (BUSPAR) 10 MG tablet; Take 1 tablet (10 mg total) by mouth 3 (three) times daily.  Dispense: 60 tablet; Refill: 2  8. Bradycardia Report any dizzinees or near syncopal episodes  9. Primary insomnia Bedtime routine - traZODone (DESYREL) 50 MG tablet; Take 0.5-1 tablets (25-50 mg total) by mouth at bedtime as needed for sleep.  Dispense: 90 tablet; Refill: 1  10. Muscle cramps Mustard Heat Message Club soda  Labs pending Health Maintenance reviewed Diet and exercise encouraged  Follow up plan: 3 months   [place]nez, FNP

## 2021-10-09 NOTE — Patient Instructions (Signed)
Muscle Cramps and Spasms Muscle cramps and spasms occur when a muscle or muscles tighten and you have no control over this tightening (involuntary muscle contraction). They are a common problem and can develop in any muscle. The most common place is in the calf muscles of the leg. Muscle cramps and muscle spasms are both involuntary muscle contractions, but there are some differences between the two: Muscle cramps are painful. They come and go and may last for a few seconds or up to 15 minutes. Muscle cramps are often more forceful and last longer than muscle spasms. Muscle spasms may or may not be painful. They may also last just a few seconds or much longer. Certain medical conditions, such as diabetes or Parkinson's disease, can make it more likely to develop cramps or spasms. However, cramps or spasms are usually not caused by a serious underlying problem. Common causes include: Doing more physical work or exercise than your body is ready for (overexertion). Overuse from repeating certain movements too many times. Remaining in a certain position for a long period of time. Improper preparation, form, or technique while playing a sport or doing an activity. Dehydration. Injury. Side effects of some medicines. Abnormally low levels of the salts and minerals in your blood (electrolytes), especially potassium and calcium. This could happen if you are taking water pills (diuretics) or if you are pregnant. In many cases, the cause of muscle cramps or spasms is not known. Follow these instructions at home: Managing pain and stiffness     Try massaging, stretching, and relaxing the affected muscle. Do this for several minutes at a time. If directed, apply heat to tight or tense muscles as often as told by your health care provider. Use the heat source that your health care provider recommends, such as a moist heat pack or a heating pad. Place a towel between your skin and the heat source. Leave the  heat on for 20-30 minutes. Remove the heat if your skin turns bright red. This is especially important if you are unable to feel pain, heat, or cold. You may have a greater risk of getting burned. If directed, put ice on the affected area. This may help if you are sore or have pain after a cramp or spasm. Put ice in a plastic bag. Place a towel between your skin and the bag. Leave the ice on for 20 minutes, 2-3 times a day. Try taking hot showers or baths to help relax tight muscles. Eating and drinking Drink enough fluid to keep your urine pale yellow. Staying well hydrated may help prevent cramps or spasms. Eat a healthy diet that includes plenty of nutrients to help your muscles function. A healthy diet includes fruits and vegetables, lean protein, whole grains, and low-fat or nonfat dairy products. General instructions If you are having frequent cramps, avoid intense exercise for several days. Take over-the-counter and prescription medicines only as told by your health care provider. Pay attention to any changes in your symptoms. Keep all follow-up visits as told by your health care provider. This is important. Contact a health care provider if: Your cramps or spasms get more severe or happen more often. Your cramps or spasms do not improve over time. Summary Muscle cramps and spasms occur when a muscle or muscles tighten and you have no control over this tightening (involuntary muscle contraction). The most common place for cramps or spasms to occur is in the calf muscles of the leg. Massaging, stretching, and relaxing the affected  muscle may relieve the cramp or spasm. Drink enough fluid to keep your urine pale yellow. Staying well hydrated may help prevent cramps or spasms. This information is not intended to replace advice given to you by your health care provider. Make sure you discuss any questions you have with your health care provider. Document Revised: 09/27/2020 Document  Reviewed: 09/27/2020 Elsevier Patient Education  [place].

## 2021-10-10 LAB — CBC WITH DIFFERENTIAL/PLATELET
Basophils Absolute: 0.1 10*3/uL (ref 0.0–0.2)
Basos: 1 %
EOS (ABSOLUTE): 0.1 10*3/uL (ref 0.0–0.4)
Eos: 2 %
Hematocrit: 35.1 % (ref 34.0–46.6)
Hemoglobin: 11.4 g/dL (ref 11.1–15.9)
Immature Grans (Abs): 0 10*3/uL (ref 0.0–0.1)
Immature Granulocytes: 0 %
Lymphocytes Absolute: 1.5 10*3/uL (ref 0.7–3.1)
Lymphs: 28 %
MCH: 30.3 pg (ref 26.6–33.0)
MCHC: 32.5 g/dL (ref 31.5–35.7)
MCV: 93 fL (ref 79–97)
Monocytes Absolute: 0.4 10*3/uL (ref 0.1–0.9)
Monocytes: 7 %
Neutrophils Absolute: 3.4 10*3/uL (ref 1.4–7.0)
Neutrophils: 62 %
Platelets: 297 10*3/uL (ref 150–450)
RBC: 3.76 x10E6/uL — ABNORMAL LOW (ref 3.77–5.28)
RDW: 12.6 % (ref 11.7–15.4)
WBC: 5.5 10*3/uL (ref 3.4–10.8)

## 2021-10-10 LAB — LIPID PANEL
Chol/HDL Ratio: 2.4 ratio (ref 0.0–4.4)
Cholesterol, Total: 113 mg/dL (ref 100–199)
HDL: 48 mg/dL (ref >39)
LDL Chol Calc (NIH): 51 mg/dL (ref 0–99)
Triglycerides: 68 mg/dL (ref 0–149)
VLDL Cholesterol Cal: 14 mg/dL (ref 5–40)

## 2021-10-10 LAB — CMP14+EGFR
ALT: 22 IU/L (ref 0–32)
AST: 26 IU/L (ref 0–40)
Albumin/Globulin Ratio: 1.9 (ref 1.2–2.2)
Albumin: 4.3 g/dL (ref 3.8–4.8)
Alkaline Phosphatase: 59 IU/L (ref 44–121)
BUN/Creatinine Ratio: 13 (ref 12–28)
BUN: 17 mg/dL (ref 8–27)
Bilirubin Total: 0.3 mg/dL (ref 0.0–1.2)
CO2: 26 mmol/L (ref 20–29)
Calcium: 9.4 mg/dL (ref 8.7–10.3)
Chloride: 101 mmol/L (ref 96–106)
Creatinine, Ser: 1.26 mg/dL — ABNORMAL HIGH (ref 0.57–1.00)
Globulin, Total: 2.3 g/dL (ref 1.5–4.5)
Glucose: 116 mg/dL — ABNORMAL HIGH (ref 70–99)
Potassium: 4.6 mmol/L (ref 3.5–5.2)
Sodium: 139 mmol/L (ref 134–144)
Total Protein: 6.6 g/dL (ref 6.0–8.5)
eGFR: 43 mL/min/{1.73_m2} — ABNORMAL LOW (ref >59)

## 2021-10-24 ENCOUNTER — Telehealth: Payer: Self-pay | Admitting: Nurse Practitioner

## 2021-10-24 MED ORDER — OXYCODONE HCL 10 MG PO TABS: 5.0000 mg | ORAL_TABLET | Freq: Two times a day (BID) | ORAL | 0 refills | Status: DC

## 2021-10-24 NOTE — Telephone Encounter (Signed)
Roxicodone sent to [facility]- 56m tabelts sent in so can only teka 1/2 tabet BID  Meds ordered this encounter  Medications   Oxycodone HCl 10 MG TABS    Sig: Take 0.5 tablets (5 mg total) by mouth 2 (two) times daily.    Dispense:  30 tablet    Refill:  0    Order Specific Question:   Supervising Provider    Answer:   DWorthy Rancher[[[id number]]  [place] FNP

## 2021-11-19 ENCOUNTER — Ambulatory Visit: Payer: Medicare Other | Admitting: Cardiology

## 2021-11-20 ENCOUNTER — Telehealth: Payer: Self-pay | Admitting: Nurse Practitioner

## 2021-11-20 DIAGNOSIS — M797 Fibromyalgia: Secondary | ICD-10-CM

## 2021-11-20 NOTE — Telephone Encounter (Signed)
Pt asking for oxyCODONE (ROXICODONE) 5 MG immediate release tablet to be sent to [facility]. [place] is out. It is due on 11/24/2021. Please call back

## 2021-11-21 ENCOUNTER — Telehealth: Payer: Self-pay | Admitting: Nurse Practitioner

## 2021-11-21 DIAGNOSIS — M797 Fibromyalgia: Secondary | ICD-10-CM

## 2021-11-21 MED ORDER — OXYCODONE HCL 5 MG PO TABS: 5.0000 mg | ORAL_TABLET | Freq: Two times a day (BID) | ORAL | 0 refills | Status: DC

## 2021-11-21 NOTE — Telephone Encounter (Signed)
Prescription sent to pharmacy.

## 2021-11-21 NOTE — Telephone Encounter (Signed)
Left detailed message rx requested has been sent.

## 2021-11-21 NOTE — Telephone Encounter (Signed)
Refer to other phone message

## 2021-11-21 NOTE — Telephone Encounter (Signed)
Patient aware and verbalizes understanding. 

## 2021-12-08 ENCOUNTER — Other Ambulatory Visit: Payer: Self-pay | Admitting: Nurse Practitioner

## 2021-12-08 DIAGNOSIS — I10 Essential (primary) hypertension: Secondary | ICD-10-CM

## 2021-12-26 ENCOUNTER — Telehealth: Payer: Self-pay | Admitting: Nurse Practitioner

## 2021-12-26 DIAGNOSIS — I10 Essential (primary) hypertension: Secondary | ICD-10-CM

## 2021-12-26 DIAGNOSIS — E1122 Type 2 diabetes mellitus with diabetic chronic kidney disease: Secondary | ICD-10-CM

## 2021-12-26 DIAGNOSIS — N1832 Chronic kidney disease, stage 3b: Secondary | ICD-10-CM

## 2021-12-26 DIAGNOSIS — E785 Hyperlipidemia, unspecified: Secondary | ICD-10-CM

## 2021-12-26 MED ORDER — EMPAGLIFLOZIN 10 MG PO TABS
10.0000 mg | ORAL_TABLET | Freq: Every day | ORAL | 1 refills | Status: DC
Start: 2021-12-26 — End: 2022-04-30

## 2021-12-26 NOTE — Telephone Encounter (Signed)
Patient aware and verbalized understanding. °

## 2021-12-26 NOTE — Telephone Encounter (Signed)
Januvia changed to Jardiance 10 mg. Prescription sent to pharmacy

## 2021-12-29 NOTE — Telephone Encounter (Signed)
Please schedule her an appointment with Almy[facility] Free asap. Referral placed.

## 2021-12-29 NOTE — Addendum Note (Signed)
Addended by: Evelina Dun A on: 12/29/2021 02:08 PM   Modules accepted: Orders

## 2021-12-29 NOTE — Telephone Encounter (Signed)
Jardiance 10 mg was called in to place Januvia and it's higher than Januvia. Please call.

## 2021-12-30 ENCOUNTER — Telehealth: Payer: Self-pay | Admitting: Pharmacist

## 2021-12-30 ENCOUNTER — Telehealth: Payer: Self-pay

## 2021-12-30 NOTE — Chronic Care Management (AMB) (Signed)
  Chronic Care Management   Note  12/30/2021 Name: Aishi Courts MRN: [id number] DOB: 03/30/1940  [name] is a 81 y.o. year old female who is a primary care patient of [name], FNP. I reached out to [name] by phone today in response to a referral sent by Ms. Mellody Drown PCP.  Ms. Borchard was given information about Chronic Care Management services today including:  CCM service includes personalized support from designated clinical staff supervised by her physician, including individualized plan of care and coordination with other care providers 24/7 contact phone numbers for assistance for urgent and routine care needs. Service will only be billed when office clinical staff spend 20 minutes or more in a month to coordinate care. Only one practitioner may furnish and bill the service in a calendar month. The patient may stop CCM services at any time (effective at the end of the month) by phone call to the office staff. The patient is responsible for co-pay (up to 20% after annual deductible is met) if co-pay is required by the individual health plan.   Patient agreed to services and verbal consent obtained.   Follow up plan: Telephone appointment with care management team member scheduled for:01/16/2022  Noreene Larsson, [place], [place] [address] Direct Dial: [phone] [email or web address]

## 2021-12-30 NOTE — Telephone Encounter (Signed)
Jardiance samples placed up front for patient to pick up

## 2021-12-30 NOTE — Progress Notes (Deleted)
Cardiology Office Note   Date:  12/30/2021   ID:  [name], DOB 1941/03/04, MRN [id number]  PCP:  [name], FNP  Cardiologist:   None   No chief complaint on file.     History of Present Illness: [name] is a 81 y.o. female who presents for follow up of chest pain and a murmur.  She saw Dr. Harl Bowie for this.  The pain was felt to be atypical.  She saw him in 2019.  She did have an negative Lexiscan Myoview in 2007.  At the last visit I increased the benazpril HCT..   ***     *** She was to have an echo after her last appt but I don't see that this was done.   She said that for some reason that she has been get scheduled.  She is back for routine follow-up.  She is having some right shoulder pain and does remember doing anything to it but it hurts to move her shoulder.  She is seeing a Restaurant manager, fast food.  It is a sharp discomfort.  She is not having any chest pressure, neck or left arm discomfort.  She is not having any new shortness of breath, PND orthopnea.  She has had no palpitations, presyncope or syncope.  Of note she was doing yard work about a month ago before her shoulder started bothering her and she was not having any problems with this.   Past Medical History:  Diagnosis Date   Anemia due to blood loss 05/02/2011   Anxiety    Arthritis    rheumatoid    Collagen vascular disease ([place])    ra   Diabetes mellitus without complication ([place])    Fibromyalgia    Hyperglycemia, drug-induced 05/02/2011   Hypertension    Ulcerative colitis     Past Surgical History:  Procedure Laterality Date   ABDOMINAL HYSTERECTOMY     BIOPSY N/A 12/28/2014   Procedure: SIGMOID AND RECTAL BIOPSIES;  Surgeon: Rogene Houston, MD;  Location: [facility];  Service: Endoscopy;  Laterality: N/A;   CHOLECYSTECTOMY     s/p   COLONOSCOPY  06/16/06. 04/27/2007   proctitis seen, biopsies showed chronic active colitis, no dysplasia    EYE SURGERY     clear lense extraction 1998     FLEXIBLE SIGMOIDOSCOPY N/A 12/28/2014   Procedure: FLEXIBLE SIGMOIDOSCOPY WITH PROPOFOL;  Surgeon: Rogene Houston, MD;  Location: [facility];  Service: Endoscopy;  Laterality: N/A;     Current Outpatient Medications  Medication Sig Dispense Refill   amLODipine (NORVASC) 2.5 MG tablet Take 1 tablet (2.5 mg total) by mouth daily. 90 tablet 1   antiseptic oral rinse (BIOTENE) LIQD 1 application by Mouth Rinse route daily. FOR DRY MOUTH     benazepril-hydrochlorthiazide (LOTENSIN HCT) 20-12.5 MG tablet Take 2 tablets by mouth daily. TAKE (2) TABLETS  BY MOUTH ONCE DAILY. 180 tablet 3   Blood Glucose Monitoring Suppl (ONETOUCH VERIO FLEX SYSTEM) w/Device KIT USE AS DIRECTED WITH TESTING SUPPLIES UP TO 4 TIMES A DAY 1 kit 0   busPIRone (BUSPAR) 10 MG tablet Take 1 tablet (10 mg total) by mouth 3 (three) times daily. 60 tablet 2   co-enzyme Q-10 50 MG capsule Take 50 mg by mouth once a week.     dicyclomine (BENTYL) 20 MG tablet Take 1/2-1 tablets (10-20 mg total) by mouth daily. 90 tablet 1   empagliflozin (JARDIANCE) 10 MG TABS tablet Take 1 tablet (10 mg total) by mouth  daily before breakfast. 90 tablet 1   escitalopram (LEXAPRO) 20 MG tablet Take 1 tablet (20 mg total) by mouth daily. 90 tablet 1   glucose blood (ONETOUCH VERIO) test strip Test BS up to 4 times daily or as directed Dx E11.22 400 strip 3   hydrocortisone (ANUSOL-HC) 2.5 % rectal cream PLACE 1 APPLICATION RECTALLY 2 TIMES A DAY 30 g 5   Lancets (ONETOUCH DELICA PLUS YTKZSW10X) MISC CHECK BLOOD SUGAR UP TO 4 TIMES A DAY OR AS DIRECTED 100 each 0   Loperamide HCl (IMODIUM PO) Take 1 tablet by mouth daily as needed (diarrhea).     methocarbamol (ROBAXIN) 500 MG tablet TAKE ONE TABLET EVERY 6 HOURS AS NEEDED FOR MUSCLE SPASMS 30 tablet 1   Multiple Vitamins-Minerals (WOMENS MULTI VITAMIN & MINERAL PO) Take 1 tablet by mouth every other day.     Omega-3 Fatty Acids (FISH OIL) 1000 MG CPDR Take 3 capsules by mouth daily.      ondansetron  (ZOFRAN) 4 MG tablet Take 1 tablet (4 mg total) by mouth every 8 (eight) hours as needed for nausea or vomiting. 20 tablet 2   oxyCODONE (ROXICODONE) 5 MG immediate release tablet Take 1 tablet (5 mg total) by mouth 2 (two) times daily. 60 tablet 0   oxyCODONE (ROXICODONE) 5 MG immediate release tablet Take 1 tablet (5 mg total) by mouth 2 (two) times daily. 60 tablet 0   oxyCODONE (ROXICODONE) 5 MG immediate release tablet Take 1 tablet (5 mg total) by mouth 2 (two) times daily. 60 tablet 0   Oxycodone HCl 10 MG TABS Take 0.5 tablets (5 mg total) by mouth 2 (two) times daily. 30 tablet 0   polyethylene glycol (MIRALAX / GLYCOLAX) 17 g packet Take 1 packet daily with 8 ounces of water as needed for constipation. 14 each 0   Probiotic Product (PROBIOTIC FORMULA PO) Take 1-2 capsules by mouth every morning.     rosuvastatin (CRESTOR) 10 MG tablet Take 1 tablet (10 mg total) by mouth daily. 90 tablet 1   shark liver oil-cocoa butter (PREPARATION H) 0.25-3-85.5 % suppository Place 1 suppository rectally as needed for hemorrhoids.     traZODone (DESYREL) 50 MG tablet Take 0.5-1 tablets (25-50 mg total) by mouth at bedtime as needed for sleep. 90 tablet 1   vitamin E 400 UNIT capsule Take 400 Units by mouth every morning.     No current facility-administered medications for this visit.    Allergies:   Azathioprine, Tape, Codeine, Milk (cow), Penicillins, Povidone-iodine, Procaine hcl, Sulfonamide derivatives, Tramadol, Acyclovir and related, Azithromycin, Clonazepam, Dairy aid [tilactase], Doxycycline, Latex, and Niacin and related    ROS:  Please see the history of present illness.   Otherwise, review of systems are positive for ***.   All other systems are reviewed and negative.    PHYSICAL EXAM: VS:  There were no vitals taken for this visit. , BMI There is no height or weight on file to calculate BMI. GENERAL:  Well appearing NECK:  No jugular venous distention, waveform within normal limits,  carotid upstroke brisk and symmetric, no bruits, no thyromegaly LUNGS:  Clear to auscultation bilaterally CHEST:  Unremarkable HEART:  PMI not displaced or sustained,S1 and S2 within normal limits, no S3, no S4, no clicks, no rubs, *** murmurs ABD:  Flat, positive bowel sounds normal in frequency in pitch, no bruits, no rebound, no guarding, no midline pulsatile mass, no hepatomegaly, no splenomegaly EXT:  2 plus pulses throughout, no edema,  no cyanosis no clubbing     ***GENERAL:  Well appearing HEENT:  Pupils equal round and reactive, fundi not visualized, oral mucosa unremarkable NECK:  No jugular venous distention, waveform within normal limits, carotid upstroke brisk and symmetric, no bruits, no thyromegaly LYMPHATICS:  No cervical, inguinal adenopathy LUNGS:  Clear to auscultation bilaterally BACK:  No CVA tenderness CHEST:  Unremarkable HEART:  PMI not displaced or sustained,S1 and S2 within normal limits, no S3, no S4, no clicks, no rubs, no murmurs ABD:  Flat, positive bowel sounds normal in frequency in pitch, no bruits, no rebound, no guarding, no midline pulsatile mass, no hepatomegaly, no splenomegaly EXT:  2 plus pulses throughout, no edema, no cyanosis no clubbing SKIN:  No rashes no nodules NEURO:  Cranial nerves II through XII grossly intact, motor grossly intact throughout PSYCH:  Cognitively intact, oriented to person place and time    EKG:  EKG is *** ordered today. The ekg ordered today demonstrates low voltage in the limb in chest leads, sinus rhythm, rate 88, nonspecific T wave flattening, no acute ST-T wave changes.   Recent Labs: 10/09/2021: ALT 22; BUN 17; Creatinine, Ser 1.26; Hemoglobin 11.4; Platelets 297; Potassium 4.6; Sodium 139    Lipid Panel    Component Value Date/Time   CHOL 113 10/09/2021 1524   CHOL 200 (H) 08/01/2012 1100   TRIG 68 10/09/2021 1524   TRIG 294 (H) 06/08/2014 1639   TRIG 171 (H) 08/01/2012 1100   HDL 48 10/09/2021 1524    HDL 44 06/08/2014 1639   HDL 55 08/01/2012 1100   CHOLHDL 2.4 10/09/2021 1524   CHOLHDL 6.1 03/04/2007 0420   VLDL 26 03/04/2007 0420   LDLCALC 51 10/09/2021 1524   LDLCALC 81 12/28/2013 1106   LDLCALC 111 (H) 08/01/2012 1100      Wt Readings from Last 3 Encounters:  10/09/21 158 lb (71.7 kg)  07/09/21 149 lb (67.6 kg)  04/10/21 150 lb (68 kg)      Other studies Reviewed: Additional studies/ records that were reviewed today include: ***. Review of the above records demonstrates:  Please see elsewhere in the note.     ASSESSMENT AND PLAN:  CHEST PAIN:   ***    She does not have chest pain that she described previously.  No further work-up.  MURMUR:    ***  I   do not appreciate a heart murmur.  I would not suggest further testing is indicated.  HTN: Her blood pressure is *** not well controlled.  Of asked her to get up blood pressure diary.  I am going to increase benazepril HCT to 40/25. Marland Kitchen  She can double up on the pills she has until she has run out of these.   Current medicines are reviewed at length with the patient today.  The patient does not have concerns regarding medicines.  The following changes have been made:  ***  Labs/ tests ordered today include: ***  No orders of the defined types were placed in this encounter.    Disposition:   FU with me as ***   Signed, Minus Breeding, MD  12/30/2021 8:17 PM    [facility]

## 2021-12-31 ENCOUNTER — Ambulatory Visit: Payer: Medicare Other | Admitting: Cardiology

## 2021-12-31 DIAGNOSIS — R011 Cardiac murmur, unspecified: Secondary | ICD-10-CM

## 2021-12-31 DIAGNOSIS — I10 Essential (primary) hypertension: Secondary | ICD-10-CM

## 2021-12-31 DIAGNOSIS — R072 Precordial pain: Secondary | ICD-10-CM

## 2022-01-13 ENCOUNTER — Other Ambulatory Visit: Payer: Self-pay | Admitting: Nurse Practitioner

## 2022-01-13 DIAGNOSIS — K5150 Left sided colitis without complications: Secondary | ICD-10-CM

## 2022-01-15 ENCOUNTER — Telehealth: Payer: Self-pay

## 2022-01-15 NOTE — Chronic Care Management (AMB) (Signed)
  Chronic Care Management Note  01/15/2022 Name: [name] MRN: [id number] DOB: 1940-05-22  [name] is a 81 y.o. year old female who is a primary care patient of [name], FNP and is actively engaged with the care management team. I reached out to [name] by phone today to assist with re-scheduling an initial visit with the Pharmacist  Follow up plan: Patient declines further follow up and engagement by the care management team. Appropriate care team members and provider have been notified via electronic communication.   Noreene Larsson, [place], [place] [address] Direct Dial: [phone] [email or web address]

## 2022-01-16 ENCOUNTER — Telehealth: Payer: Medicare Other

## 2022-01-19 ENCOUNTER — Ambulatory Visit (INDEPENDENT_AMBULATORY_CARE_PROVIDER_SITE_OTHER): Payer: Medicare Other

## 2022-01-19 DIAGNOSIS — I10 Essential (primary) hypertension: Secondary | ICD-10-CM

## 2022-01-19 DIAGNOSIS — N1832 Chronic kidney disease, stage 3b: Secondary | ICD-10-CM

## 2022-01-19 DIAGNOSIS — E1122 Type 2 diabetes mellitus with diabetic chronic kidney disease: Secondary | ICD-10-CM

## 2022-01-19 NOTE — Chronic Care Management (AMB) (Signed)
Chronic Care Management Provider Comprehensive Care Plan    01/19/2022 Name: [name] MRN: [id number] DOB: 06/08/1940  Referral to Chronic Care Management (CCM) services was placed by Provider:  [name], FNP on Date: 12-29-2021.  Chronic Condition 1: HTN Provider Assessment and Plan Low sodium diet - CBC with Differential/Platelet - CMP14+EGFR - CBC with Differential/Platelet - CMP14+EGFR   Expected Outcome/Goals Addressed This Visit (Provider CCM goals/Provider Assessment and plan   CCM (HYPERTENSION)  EXPECTED OUTCOME:  MONITOR,SELF- MANAGE AND REDUCE SYMPTOMS OF HYPERTENSION    Symptom Management Condition 1: Take all medications as prescribed Attend all scheduled provider appointments Call pharmacy for medication refills 3-7 days in advance of running out of medications Call provider office for new concerns or questions  call the Suicide and Crisis Lifeline: 988 call the [place] National Suicide Prevention Lifeline: 303-322-4591 or TTY: 734-063-7947 TTY 2261176301) to talk to a trained counselor call 1-800-273-TALK (toll free, 24 hour hotline) if experiencing a Mental Health or [place]  check blood pressure weekly write blood pressure results in a log or diary learn about high blood pressure take blood pressure log to all doctor appointments call doctor for signs and symptoms of high blood pressure keep all doctor appointments take medications for blood pressure exactly as prescribed report new symptoms to your doctor  Chronic Condition 2: DM Provider Assessment and Plan Continue to watch carbs in diet - Bayer DCA Hb A1c Waived - sitaGLIPtin (JANUVIA) 100 MG tablet; Take 1 tablet (100 mg total) by mouth daily.  Dispense: 90 tablet; Refill: 1   Expected Outcome/Goals Addressed This Visit (Provider CCM goals/Provider Assessment and plan   CCM (DIABETES) EXPECTED OUTCOME: MONITOR, SELF-MANAGE AND REDUCE SYMPTOMS OF  DIABETES   Symptom Management Condition 2: Take all medications as prescribed Attend all scheduled provider appointments Call provider office for new concerns or questions  call the Suicide and Crisis Lifeline: 988 call the [place] National Suicide Prevention Lifeline: 5342122868 or TTY: [phone] [place] [phone]) to talk to a trained counselor call 1-800-273-TALK (toll free, 24 hour hotline) if experiencing a Mental Health or [place]  check feet daily for cuts, sores or redness trim toenails straight across drink 6 to 8 glasses of water each day fill half of plate with vegetables manage portion size prepare main meal at home 3 to 5 days each week wash and dry feet carefully every day wear comfortable, cotton socks wear comfortable, well-fitting shoes  Chronic Condition 3: CKD Provider Assessment and Plan Labs pending  Continue to watch carbs in diet - Bayer DCA Hb A1c Waived - sitaGLIPtin (JANUVIA) 100 MG tablet; Take 1 tablet (100 mg total) by mouth daily.  Dispense: 90 tablet; Refill: 1  Expected Outcome/Goals Addressed This Visit (Provider CCM goals/Provider Assessment and plan   CCM (KIDNEY FAILURE)  EXPECTED OUTCOME:  MONITOR, SELF-MANAGE AND REDUCE SYMPTOMS OF KIDNEY FAILURE  Symptom Management Condition 3: Take all medications as prescribed Attend all scheduled provider appointments Call provider office for new concerns or questions  call the Suicide and Crisis Lifeline: 988 call the [place] National Suicide Prevention Lifeline: (818)465-2589 or TTY: (339)786-8769 TTY [phone]) to talk to a trained counselor call 1-800-273-TALK (toll free, 24 hour hotline) if experiencing a Mental Health or [place] Crisis   Problem List Patient Active Problem List   Diagnosis Date Noted   Type 2 diabetes mellitus with diabetic chronic kidney disease ([place]) 05/27/2020   Murmur 08/01/2019   CKD (chronic kidney disease) stage 3, GFR  30-59 ml/min ([place])  11/07/2015   Hyperlipidemia with target LDL less than 100 01/24/2015   Insomnia 01/24/2015   Bradycardia 05/03/2011   Fibromyalgia 11/04/2010   Depression 11/04/2010   Essential hypertension 03/08/2007   ULCERATIVE COLITIS, LEFT SIDED 03/08/2007    Medication Management  Current Outpatient Medications:    amLODipine (NORVASC) 2.5 MG tablet, Take 1 tablet (2.5 mg total) by mouth daily., Disp: 90 tablet, Rfl: 1   antiseptic oral rinse (BIOTENE) LIQD, 1 application by Mouth Rinse route daily. FOR DRY MOUTH, Disp: , Rfl:    benazepril-hydrochlorthiazide (LOTENSIN HCT) 20-12.5 MG tablet, Take 2 tablets by mouth daily. TAKE (2) TABLETS  BY MOUTH ONCE DAILY., Disp: 180 tablet, Rfl: 3   Blood Glucose Monitoring Suppl (ONETOUCH VERIO FLEX SYSTEM) w/Device KIT, USE AS DIRECTED WITH TESTING SUPPLIES UP TO 4 TIMES A DAY, Disp: 1 kit, Rfl: 0   busPIRone (BUSPAR) 10 MG tablet, Take 1 tablet (10 mg total) by mouth 3 (three) times daily., Disp: 60 tablet, Rfl: 2   co-enzyme Q-10 50 MG capsule, Take 50 mg by mouth once a week., Disp: , Rfl:    dicyclomine (BENTYL) 20 MG tablet, Take 1/2-1 tablets (10-20 mg total) by mouth daily., Disp: 90 tablet, Rfl: 1   empagliflozin (JARDIANCE) 10 MG TABS tablet, Take 1 tablet (10 mg total) by mouth daily before breakfast., Disp: 90 tablet, Rfl: 1   escitalopram (LEXAPRO) 20 MG tablet, Take 1 tablet (20 mg total) by mouth daily., Disp: 90 tablet, Rfl: 1   glucose blood (ONETOUCH VERIO) test strip, Test BS up to 4 times daily or as directed Dx E11.22, Disp: 400 strip, Rfl: 3   hydrocortisone (ANUSOL-HC) 2.5 % rectal cream, PLACE 1 APPLICATION RECTALLY 2 TIMES A DAY, Disp: 30 g, Rfl: 5   Lancets (ONETOUCH DELICA PLUS [id number]) MISC, CHECK BLOOD SUGAR UP TO 4 TIMES A DAY OR AS DIRECTED, Disp: 100 each, Rfl: 0   Loperamide HCl (IMODIUM PO), Take 1 tablet by mouth daily as needed (diarrhea)., Disp: , Rfl:    methocarbamol (ROBAXIN) 500 MG tablet, TAKE ONE TABLET EVERY 6  HOURS AS NEEDED FOR MUSCLE SPASMS, Disp: 30 tablet, Rfl: 1   Multiple Vitamins-Minerals (WOMENS MULTI VITAMIN & MINERAL PO), Take 1 tablet by mouth every other day., Disp: , Rfl:    Omega-3 Fatty Acids (FISH OIL) 1000 MG CPDR, Take 3 capsules by mouth daily. , Disp: , Rfl:    ondansetron (ZOFRAN) 4 MG tablet, TAKE ONE TABLET EVERY 8 HOURS AS NEEDED FOR NAUSEA AND VOMITING, Disp: 20 tablet, Rfl: 0   oxyCODONE (ROXICODONE) 5 MG immediate release tablet, Take 1 tablet (5 mg total) by mouth 2 (two) times daily., Disp: 60 tablet, Rfl: 0   oxyCODONE (ROXICODONE) 5 MG immediate release tablet, Take 1 tablet (5 mg total) by mouth 2 (two) times daily., Disp: 60 tablet, Rfl: 0   oxyCODONE (ROXICODONE) 5 MG immediate release tablet, Take 1 tablet (5 mg total) by mouth 2 (two) times daily., Disp: 60 tablet, Rfl: 0   Oxycodone HCl 10 MG TABS, Take 0.5 tablets (5 mg total) by mouth 2 (two) times daily., Disp: 30 tablet, Rfl: 0   polyethylene glycol (MIRALAX / GLYCOLAX) 17 g packet, Take 1 packet daily with 8 ounces of water as needed for constipation., Disp: 14 each, Rfl: 0   Probiotic Product (PROBIOTIC FORMULA PO), Take 1-2 capsules by mouth every morning., Disp: , Rfl:    rosuvastatin (CRESTOR) 10 MG tablet, Take 1 tablet (10  mg total) by mouth daily., Disp: 90 tablet, Rfl: 1   shark liver oil-cocoa butter (PREPARATION H) 0.25-3-85.5 % suppository, Place 1 suppository rectally as needed for hemorrhoids., Disp: , Rfl:    traZODone (DESYREL) 50 MG tablet, Take 0.5-1 tablets (25-50 mg total) by mouth at bedtime as needed for sleep., Disp: 90 tablet, Rfl: 1   vitamin E 400 UNIT capsule, Take 400 Units by mouth every morning., Disp: , Rfl:   Cognitive Assessment Identity Confirmed: : Name; DOB Cognitive Status: Normal   Functional Assessment Hearing Difficulty or Deaf: no Wear Glasses or Blind: no Concentrating, Remembering or Making Decisions Difficulty (CP): no Difficulty Communicating: no Difficulty  Eating/Swallowing: no Walking or Climbing Stairs Difficulty: no Dressing/Bathing Difficulty: no Doing Errands Independently Difficulty (such as shopping) (CP): no Change in Functional Status Since Onset of Current Illness/Injury: no   Caregiver Assessment  Primary Source of Support/Comfort: extended family Name of Support/Comfort Primary Source: niece- Etheleen Sia People in Home: alone Family Caregiver if Needed: other relative(s) Family Caregiver Names: Bobbi Atkins Primary Roles/Responsibilities: retired   Planned Interventions  Assessed the patient     understanding of chronic kidney disease    Evaluation of current treatment plan related to chronic kidney disease self management and patient's adherence to plan as established by provider      Provided education to patient re: stroke prevention, s/s of heart attack and stroke    Reviewed medications with patient and discussed importance of compliance    Advised patient, providing education and rationale, to monitor blood pressure daily and record, calling PCP for findings outside established parameters    Discussed complications of poorly controlled blood pressure such as heart disease, stroke, circulatory complications, vision complications, kidney impairment, sexual dysfunction    Discussed plans with patient for ongoing care management follow up and provided patient with direct contact information for care management team    Discussed the impact of chronic kidney disease on daily life and mental health and acknowledged and normalized feelings of disempowerment, fear, and frustration    Provided education on kidney disease progression    Engage patient in early, proactive and ongoing discussion about goals of care and what matters most to them    Support coping and stress management by recognizing current strategies and assist in developing new strategies such as mindfulness, journaling, relaxation techniques, problem-solving     Evaluation of current treatment plan related to hypertension self management and patient's adherence to plan as established by provider;   Provided education to patient re: stroke prevention, s/s of heart attack and stroke; Reviewed prescribed diet heart healthy/ADA diet  Reviewed medications with patient and discussed importance of compliance;  Discussed plans with patient for ongoing care management follow up and provided patient with direct contact information for care management team; Advised patient, providing education and rationale, to monitor blood pressure daily and record, calling PCP for findings outside established parameters;  Advised patient to discuss changes in blood pressure or heart health with provider; Provided education on prescribed diet heart healthy/ADA diet ;  Discussed complications of poorly controlled blood pressure such as heart disease, stroke, circulatory complications, vision complications, kidney impairment, sexual dysfunction;  Screening for signs and symptoms of depression related to chronic disease state;  Assessed social determinant of health barriers;  Provided education to patient about basic DM disease process; Reviewed medications with patient and discussed importance of medication adherence;        Reviewed prescribed diet with patient Heart Healthy/ADA diet ;  Counseled on importance of regular laboratory monitoring as prescribed;        Discussed plans with patient for ongoing care management follow up and provided patient with direct contact information for care management team;      Provided patient with written educational materials related to hypo and hyperglycemia and importance of correct treatment;       Reviewed scheduled/upcoming provider appointments including: 01-26-2022 at 330 pm;         call provider for findings outside established parameters;       Referral made to pharmacy team for assistance with working with pharmacy for medications  assistance and education;       Review of patient status, including review of consultants reports, relevant laboratory and other test results, and medications completed;       Advised patient to discuss changes in DM or other chronic conditions with provider;      Screening for signs and symptoms of depression related to chronic disease state;        Assessed social determinant of health barriers;     The patient states she does not feel well today and hasn't for the last couple of days. She states she has diarrhea and "bad" acid reflux. She states that she is staying hydrated and wants to lay down and rest. She has family that checks in on her and she states she can call family if needed. Declined needing an earlier appointment with pcp. Will see pcp on 01-26-2022.      Interaction and coordination with outside resources, practitioners, and providers See CCM Referral  Care Plan: Available in MyChart

## 2022-01-19 NOTE — Patient Instructions (Signed)
Please call the care guide team at [phone] if you need to cancel or reschedule your appointment.   If you are experiencing a Mental Health or [place] or need someone to talk to, please call the Suicide and Crisis Lifeline: 988 call the [place] National Suicide Prevention Lifeline: 984-134-9192 or TTY: (520)554-1094 TTY [phone]) to talk to a trained counselor call 1-800-273-TALK (toll free, 24 hour hotline)   Following is a copy of your full provider care plan:   Goals Addressed             This Visit's Progress    CCM (KIDNEY FAILURE)  EXPECTED OUTCOME:  MONITOR, SELF-MANAGE AND REDUCE SYMPTOMS OF KIDNEY FAILURE       Current Barriers:  Chronic Disease Management support and education needs related to effective management of CKD  Planned Interventions: Assessed the patient     understanding of chronic kidney disease    Evaluation of current treatment plan related to chronic kidney disease self management and patient's adherence to plan as established by provider      Provided education to patient re: stroke prevention, s/s of heart attack and stroke    Reviewed medications with patient and discussed importance of compliance    Advised patient, providing education and rationale, to monitor blood pressure daily and record, calling PCP for findings outside established parameters    Discussed complications of poorly controlled blood pressure such as heart disease, stroke, circulatory complications, vision complications, kidney impairment, sexual dysfunction    Discussed plans with patient for ongoing care management follow up and provided patient with direct contact information for care management team    Discussed the impact of chronic kidney disease on daily life and mental health and acknowledged and normalized feelings of disempowerment, fear, and frustration    Provided education on kidney disease progression    Engage patient in early, proactive and ongoing  discussion about goals of care and what matters most to them    Support coping and stress management by recognizing current strategies and assist in developing new strategies such as mindfulness, journaling, relaxation techniques, problem-solving     Symptom Management: Take medications as prescribed   Attend all scheduled provider appointments Call pharmacy for medication refills 3-7 days in advance of running out of medications Call provider office for new concerns or questions  call the Suicide and Crisis Lifeline: 988 call the [place] National Suicide Prevention Lifeline: 445 420 3387 or TTY: 517-042-6182 TTY [phone]) to talk to a trained counselor call 1-800-273-TALK (toll free, 24 hour hotline) if experiencing a Mental Health or [place]   Follow Up Plan: Telephone follow up appointment with care management team member scheduled for: 02-23-2022 at 345 pm       CCM Expected Outcome:  Monitor, Self-Manage and Reduce Symptoms of Diabetes       Current Barriers:  Care Coordination needs related to how to get assistance with medicationa in a patient with DM Chronic Disease Management support and education needs related to Effective management of DM Financial Constraints.   Planned Interventions: Provided education to patient about basic DM disease process; Reviewed medications with patient and discussed importance of medication adherence;        Reviewed prescribed diet with patient Heart Healthy/ADA diet ; Counseled on importance of regular laboratory monitoring as prescribed;        Discussed plans with patient for ongoing care management follow up and provided patient with direct contact information for care management team;  Provided patient with written educational materials related to hypo and hyperglycemia and importance of correct treatment;       Reviewed scheduled/upcoming provider appointments including: 01-26-2022 at 330 pm;         call provider for  findings outside established parameters;       Referral made to pharmacy team for assistance with working with pharmacy for medications assistance and education;       Review of patient status, including review of consultants reports, relevant laboratory and other test results, and medications completed;       Advised patient to discuss changes in DM or other chronic conditions with provider;      Screening for signs and symptoms of depression related to chronic disease state;        Assessed social determinant of health barriers;     The patient states she does not feel well today and hasn't for the last couple of days. She states she has diarrhea and "bad" acid reflux. She states that she is staying hydrated and wants to lay down and rest. She has family that checks in on her and she states she can call family if needed. Declined needing an earlier appointment with pcp. Will see pcp on 01-26-2022.     Symptom Management: Take medications as prescribed   Attend all scheduled provider appointments Call pharmacy for medication refills 3-7 days in advance of running out of medications Call provider office for new concerns or questions  call the Suicide and Crisis Lifeline: 988 call the [place] National Suicide Prevention Lifeline: 435-316-1855 or TTY: (814)171-1151 TTY 361 257 8201) to talk to a trained counselor call 1-800-273-TALK (toll free, 24 hour hotline) if experiencing a Mental Health or [place]  check feet daily for cuts, sores or redness trim toenails straight across drink 6 to 8 glasses of water each day fill half of plate with vegetables manage portion size prepare main meal at home 3 to 5 days each week wash and dry feet carefully every day wear comfortable, cotton socks wear comfortable, well-fitting shoes  Follow Up Plan: Telephone follow up appointment with care management team member scheduled for: 02-23-2022 at 345 pm       CCM Expected Outcome:  Monitor,  Self-Manage, and Reduce Symptoms of Hypertension       Current Barriers:  Knowledge Deficits related to the importance of taking blood pressures on a regular basis Chronic Disease Management support and education needs related to effective management of HTN  Planned Interventions: Evaluation of current treatment plan related to hypertension self management and patient's adherence to plan as established by provider;   Provided education to patient re: stroke prevention, s/s of heart attack and stroke; Reviewed prescribed diet heart healthy/ADA diet  Reviewed medications with patient and discussed importance of compliance;  Discussed plans with patient for ongoing care management follow up and provided patient with direct contact information for care management team; Advised patient, providing education and rationale, to monitor blood pressure daily and record, calling PCP for findings outside established parameters;  Advised patient to discuss changes in blood pressure or heart health with provider; Provided education on prescribed diet heart healthy/ADA diet ;  Discussed complications of poorly controlled blood pressure such as heart disease, stroke, circulatory complications, vision complications, kidney impairment, sexual dysfunction;  Screening for signs and symptoms of depression related to chronic disease state;  Assessed social determinant of health barriers;   Symptom Management: Take medications as prescribed   Attend all scheduled provider  appointments Call pharmacy for medication refills 3-7 days in advance of running out of medications Call provider office for new concerns or questions  call the Suicide and Crisis Lifeline: 988 call the [place] National Suicide Prevention Lifeline: (270)580-4714 or TTY: (912)811-0792 TTY (956)612-8693) to talk to a trained counselor call 1-800-273-TALK (toll free, 24 hour hotline) if experiencing a Mental Health or [place]  check  blood pressure weekly learn about high blood pressure keep a blood pressure log call doctor for signs and symptoms of high blood pressure develop an action plan for high blood pressure keep all doctor appointments take medications for blood pressure exactly as prescribed report new symptoms to your doctor  Follow Up Plan: Telephone follow up appointment with care management team member scheduled for: 02-23-2022 at 345 pm       Patient Stated   On track    09/27/2020 AWV Goal: Exercise for General Health  Patient will verbalize understanding of the benefits of increased physical activity: Exercising regularly is important. It will improve your overall fitness, flexibility, and endurance. Regular exercise also will improve your overall health. It can help you control your weight, reduce stress, and improve your bone density. Over the next year, patient will increase physical activity as tolerated with a goal of at least 150 minutes of moderate physical activity per week.  You can tell that you are exercising at a moderate intensity if your heart starts beating faster and you start breathing faster but can still hold a conversation. Moderate-intensity exercise ideas include: Walking 1 mile (1.6 km) in about 15 minutes Biking Hiking Golfing Dancing Water aerobics Patient will verbalize understanding of everyday activities that increase physical activity by providing examples like the following: Yard work, such as: Sales promotion account executive Gardening Washing windows or floors Patient will be able to explain general safety guidelines for exercising:  Before you start a new exercise program, talk with your health care provider. Do not exercise so much that you hurt yourself, feel dizzy, or get very short of breath. Wear comfortable clothes and wear shoes with good support. Drink plenty of water while you exercise  to prevent dehydration or heat stroke. Work out until your breathing and your heartbeat get faster.         Patient verbalizes understanding of instructions and care plan provided today and agrees to view in [place]. Active MyChart status and patient understanding of how to access instructions and care plan via MyChart confirmed with patient.     Telephone follow up appointment with care management team member scheduled for: 02-23-2022 at 345 pm

## 2022-01-19 NOTE — Chronic Care Management (AMB) (Signed)
Chronic Care Management   CCM RN Visit Note  01/19/2022 Name: [name] MRN: [id number] DOB: 1940-11-27  Subjective: [name] is a 81 y.o. year old female who is a primary care patient of [name], FNP. The patient was referred to the Chronic Care Management team for assistance with care management needs subsequent to provider initiation of CCM services and plan of care.    Today's Visit:  Engaged with patient by telephone for initial visit.     SDOH Interventions Today    Flowsheet Row Most Recent Value  SDOH Interventions   Food Insecurity Interventions Intervention Not Indicated  Housing Interventions Intervention Not Indicated  Transportation Interventions Intervention Not Indicated  Utilities Interventions Intervention Not Indicated  Alcohol Usage Interventions Intervention Not Indicated (Score <7)  Financial Strain Interventions Intervention Not Indicated         Goals Addressed             This Visit's Progress    CCM (KIDNEY FAILURE)  EXPECTED OUTCOME:  MONITOR, SELF-MANAGE AND REDUCE SYMPTOMS OF KIDNEY FAILURE       Current Barriers:  Chronic Disease Management support and education needs related to effective management of CKD  Planned Interventions: Assessed the patient     understanding of chronic kidney disease    Evaluation of current treatment plan related to chronic kidney disease self management and patient's adherence to plan as established by provider      Provided education to patient re: stroke prevention, s/s of heart attack and stroke    Reviewed medications with patient and discussed importance of compliance    Advised patient, providing education and rationale, to monitor blood pressure daily and record, calling PCP for findings outside established parameters    Discussed complications of poorly controlled blood pressure such as heart disease, stroke, circulatory complications, vision complications, kidney impairment, sexual  dysfunction    Discussed plans with patient for ongoing care management follow up and provided patient with direct contact information for care management team    Discussed the impact of chronic kidney disease on daily life and mental health and acknowledged and normalized feelings of disempowerment, fear, and frustration    Provided education on kidney disease progression    Engage patient in early, proactive and ongoing discussion about goals of care and what matters most to them    Support coping and stress management by recognizing current strategies and assist in developing new strategies such as mindfulness, journaling, relaxation techniques, problem-solving     Symptom Management: Take medications as prescribed   Attend all scheduled provider appointments Call pharmacy for medication refills 3-7 days in advance of running out of medications Call provider office for new concerns or questions  call the Suicide and Crisis Lifeline: 988 call the [place] National Suicide Prevention Lifeline: 817-378-4138 or TTY: 403-412-3259 TTY 863-674-6679) to talk to a trained counselor call 1-800-273-TALK (toll free, 24 hour hotline) if experiencing a Mental Health or [place]   Follow Up Plan: Telephone follow up appointment with care management team member scheduled for: 02-23-2022 at 345 pm       CCM Expected Outcome:  Monitor, Self-Manage and Reduce Symptoms of Diabetes       Current Barriers:  Care Coordination needs related to how to get assistance with medicationa in a patient with DM Chronic Disease Management support and education needs related to Effective management of DM Financial Constraints.   Planned Interventions: Provided education to patient about basic DM disease process; Reviewed medications  with patient and discussed importance of medication adherence;        Reviewed prescribed diet with patient Heart Healthy/ADA diet ; Counseled on importance of regular  laboratory monitoring as prescribed;        Discussed plans with patient for ongoing care management follow up and provided patient with direct contact information for care management team;      Provided patient with written educational materials related to hypo and hyperglycemia and importance of correct treatment;       Reviewed scheduled/upcoming provider appointments including: 01-26-2022 at 330 pm;         call provider for findings outside established parameters;       Referral made to pharmacy team for assistance with working with pharmacy for medications assistance and education;       Review of patient status, including review of consultants reports, relevant laboratory and other test results, and medications completed;       Advised patient to discuss changes in DM or other chronic conditions with provider;      Screening for signs and symptoms of depression related to chronic disease state;        Assessed social determinant of health barriers;     The patient states she does not feel well today and hasn't for the last couple of days. She states she has diarrhea and "bad" acid reflux. She states that she is staying hydrated and wants to lay down and rest. She has family that checks in on her and she states she can call family if needed. Declined needing an earlier appointment with pcp. Will see pcp on 01-26-2022.     Symptom Management: Take medications as prescribed   Attend all scheduled provider appointments Call pharmacy for medication refills 3-7 days in advance of running out of medications Call provider office for new concerns or questions  call the Suicide and Crisis Lifeline: 988 call the [place] National Suicide Prevention Lifeline: 351-394-1922 or TTY: 270-430-2171 TTY 609-336-6647) to talk to a trained counselor call 1-800-273-TALK (toll free, 24 hour hotline) if experiencing a Mental Health or [place]  check feet daily for cuts, sores or redness trim  toenails straight across drink 6 to 8 glasses of water each day fill half of plate with vegetables manage portion size prepare main meal at home 3 to 5 days each week wash and dry feet carefully every day wear comfortable, cotton socks wear comfortable, well-fitting shoes  Follow Up Plan: Telephone follow up appointment with care management team member scheduled for: 02-23-2022 at 345 pm       CCM Expected Outcome:  Monitor, Self-Manage, and Reduce Symptoms of Hypertension       Current Barriers:  Knowledge Deficits related to the importance of taking blood pressures on a regular basis Chronic Disease Management support and education needs related to effective management of HTN  Planned Interventions: Evaluation of current treatment plan related to hypertension self management and patient's adherence to plan as established by provider;   Provided education to patient re: stroke prevention, s/s of heart attack and stroke; Reviewed prescribed diet heart healthy/ADA diet  Reviewed medications with patient and discussed importance of compliance;  Discussed plans with patient for ongoing care management follow up and provided patient with direct contact information for care management team; Advised patient, providing education and rationale, to monitor blood pressure daily and record, calling PCP for findings outside established parameters;  Advised patient to discuss changes in blood pressure or heart health  with provider; Provided education on prescribed diet heart healthy/ADA diet ;  Discussed complications of poorly controlled blood pressure such as heart disease, stroke, circulatory complications, vision complications, kidney impairment, sexual dysfunction;  Screening for signs and symptoms of depression related to chronic disease state;  Assessed social determinant of health barriers;   Symptom Management: Take medications as prescribed   Attend all scheduled provider  appointments Call pharmacy for medication refills 3-7 days in advance of running out of medications Call provider office for new concerns or questions  call the Suicide and Crisis Lifeline: 988 call the [place] National Suicide Prevention Lifeline: (541)522-6257 or TTY: 980-598-0666 TTY 430-311-7018) to talk to a trained counselor call 1-800-273-TALK (toll free, 24 hour hotline) if experiencing a Mental Health or [place]  check blood pressure weekly learn about high blood pressure keep a blood pressure log call doctor for signs and symptoms of high blood pressure develop an action plan for high blood pressure keep all doctor appointments take medications for blood pressure exactly as prescribed report new symptoms to your doctor  Follow Up Plan: Telephone follow up appointment with care management team member scheduled for: 02-23-2022 at 345 pm       Patient Stated   On track    09/27/2020 AWV Goal: Exercise for General Health  Patient will verbalize understanding of the benefits of increased physical activity: Exercising regularly is important. It will improve your overall fitness, flexibility, and endurance. Regular exercise also will improve your overall health. It can help you control your weight, reduce stress, and improve your bone density. Over the next year, patient will increase physical activity as tolerated with a goal of at least 150 minutes of moderate physical activity per week.  You can tell that you are exercising at a moderate intensity if your heart starts beating faster and you start breathing faster but can still hold a conversation. Moderate-intensity exercise ideas include: Walking 1 mile (1.6 km) in about 15 minutes Biking Hiking Golfing Dancing Water aerobics Patient will verbalize understanding of everyday activities that increase physical activity by providing examples like the following: Yard work, such as: Clinical cytogeneticist Gardening Washing windows or floors Patient will be able to explain general safety guidelines for exercising:  Before you start a new exercise program, talk with your health care provider. Do not exercise so much that you hurt yourself, feel dizzy, or get very short of breath. Wear comfortable clothes and wear shoes with good support. Drink plenty of water while you exercise to prevent dehydration or heat stroke. Work out until your breathing and your heartbeat get faster.         Plan:Telephone follow up appointment with care management team member scheduled for:  02-23-2022 at 56 pm  [place], MSN, CCM RN Care Manager  Chronic Care Management Direct Number: [phone]

## 2022-01-20 DIAGNOSIS — Z794 Long term (current) use of insulin: Secondary | ICD-10-CM | POA: Diagnosis not present

## 2022-01-20 DIAGNOSIS — E1159 Type 2 diabetes mellitus with other circulatory complications: Secondary | ICD-10-CM

## 2022-01-20 DIAGNOSIS — I10 Essential (primary) hypertension: Secondary | ICD-10-CM | POA: Diagnosis not present

## 2022-01-22 ENCOUNTER — Ambulatory Visit (INDEPENDENT_AMBULATORY_CARE_PROVIDER_SITE_OTHER): Payer: Medicare Other | Admitting: Family Medicine

## 2022-01-22 ENCOUNTER — Encounter: Payer: Self-pay | Admitting: Family Medicine

## 2022-01-22 ENCOUNTER — Telehealth: Payer: Self-pay | Admitting: Nurse Practitioner

## 2022-01-22 DIAGNOSIS — Z765 Malingerer [conscious simulation]: Secondary | ICD-10-CM | POA: Diagnosis not present

## 2022-01-22 DIAGNOSIS — M797 Fibromyalgia: Secondary | ICD-10-CM

## 2022-01-22 NOTE — Telephone Encounter (Signed)
Patient needs med sent to [facility].

## 2022-01-22 NOTE — Progress Notes (Signed)
    Subjective:    Patient ID: [name], female    DOB: [birth year], 81 y.o.   MRN: [id number]   HPI: [name] is a 81 y.o. female presenting for ostensibly cold sx. Howvever as soon as I connecet with her she stated she would be out of her oxycodone tomorrow and didn't have an Appointment with Shelah Lewandowsky until next week. Insisted on a refill. Hung up after being told no repeatedly in spite of asking her about her cold symptoms.      01/19/2022   12:14 PM 10/09/2021    3:29 PM 07/09/2021    2:49 PM 04/10/2021    2:50 PM 01/10/2021    3:23 PM  Depression screen PHQ 2/9  Decreased Interest 0 0 0 2 0  Down, Depressed, Hopeless 0 0 0 2 0  PHQ - 2 Score 0 0 0 4 0  Altered sleeping  0 0 2 1  Tired, decreased energy  0 0 2 0  Change in appetite  0 0 2 0  Feeling bad or failure about yourself   0 0 3 0  Trouble concentrating  0 0 0 0  Moving slowly or fidgety/restless  0 0 0 0  Suicidal thoughts  0 0 0 0  PHQ-9 Score  0 0 13 1  Difficult doing work/chores  Not difficult at all Not difficult at all Somewhat difficult Not difficult at all     Relevant past medical, surgical, family and social history reviewed and updated as indicated.  Interim medical history since our last visit reviewed. Allergies and medications reviewed and updated.  ROS:  Review of Systems   Social History   Tobacco Use  Smoking Status Never  Smokeless Tobacco Never       Objective:     Wt Readings from Last 3 Encounters:  10/09/21 158 lb (71.7 kg)  07/09/21 149 lb (67.6 kg)  04/10/21 150 lb (68 kg)     Exam deferred. Pt. [place] due to COVID 19. Phone visit performed.   Assessment & Plan:   1. Drug-seeking behavior     No orders of the defined types were placed in this encounter.   No orders of the defined types were placed in this encounter.     Diagnoses and all orders for this visit:  Drug-seeking behavior    Virtual Visit via telephone Note  I discussed the  limitations, risks, security and privacy concerns of performing an evaluation and management service by telephone and the availability of in person appointments. The patient was identified with two identifiers. Pt.expressed understanding and agreed to proceed. Pt. Is at home. Dr. Livia Snellen is in his office.  Follow Up Instructions:   I discussed the assessment and treatment plan with the patient. The patient was provided an opportunity to ask questions and all were answered. The patient agreed with the plan and demonstrated an understanding of the instructions.   The patient was advised to call back or seek an in-person evaluation if the symptoms worsen or if the condition fails to improve as anticipated.   Total minutes including chart review and phone contact time: 8   Follow up plan: No follow-ups on file.  Claretta Fraise, MD [place]

## 2022-01-22 NOTE — Telephone Encounter (Signed)
Please review

## 2022-01-23 ENCOUNTER — Other Ambulatory Visit: Payer: Self-pay | Admitting: Nurse Practitioner

## 2022-01-23 ENCOUNTER — Telehealth: Payer: Self-pay | Admitting: Nurse Practitioner

## 2022-01-23 DIAGNOSIS — E1122 Type 2 diabetes mellitus with diabetic chronic kidney disease: Secondary | ICD-10-CM

## 2022-01-23 MED ORDER — OXYCODONE HCL 5 MG PO TABS: 5.0000 mg | ORAL_TABLET | Freq: Two times a day (BID) | ORAL | 0 refills | Status: DC

## 2022-01-23 NOTE — Telephone Encounter (Signed)
Pain meds sent to pharmacy- needs to keep follow up appointment

## 2022-01-23 NOTE — Telephone Encounter (Signed)
  oxyCODONE (ROXICODONE) 5 MG immediate release tablet ([place] is out of her meds--She wants it sent to [facility]) Please call pt.

## 2022-01-23 NOTE — Telephone Encounter (Signed)
Left message making pt aware.

## 2022-01-25 NOTE — Progress Notes (Deleted)
Cardiology Office Note   Date:  01/25/2022   ID:  [name], DOB December 04, 1940, MRN [id number]  PCP:  [name], FNP  Cardiologist:   None   No chief complaint on file.     History of Present Illness: [name] is a 81 y.o. female who presents for follow up of chest pain and a murmur.  She saw Dr. Harl Bowie for this.  The pain was felt to be atypical.  She saw him in 2019.  She did have an negative Lexiscan Myoview in 2007.  At the last visit I increased the benazpril HCT..   ***     *** She was to have an echo after her last appt but I don't see that this was done.   She said that for some reason that she has been get scheduled.  She is back for routine follow-up.  She is having some right shoulder pain and does remember doing anything to it but it hurts to move her shoulder.  She is seeing a Restaurant manager, fast food.  It is a sharp discomfort.  She is not having any chest pressure, neck or left arm discomfort.  She is not having any new shortness of breath, PND orthopnea.  She has had no palpitations, presyncope or syncope.  Of note she was doing yard work about a month ago before her shoulder started bothering her and she was not having any problems with this.   Past Medical History:  Diagnosis Date   Anemia due to blood loss 05/02/2011   Anxiety    Arthritis    rheumatoid    Collagen vascular disease ([place])    ra   Diabetes mellitus without complication ([place])    Fibromyalgia    Hyperglycemia, drug-induced 05/02/2011   Hypertension    Ulcerative colitis     Past Surgical History:  Procedure Laterality Date   ABDOMINAL HYSTERECTOMY     BIOPSY N/A 12/28/2014   Procedure: SIGMOID AND RECTAL BIOPSIES;  Surgeon: Rogene Houston, MD;  Location: [facility];  Service: Endoscopy;  Laterality: N/A;   CHOLECYSTECTOMY     s/p   COLONOSCOPY  06/16/06. 04/27/2007   proctitis seen, biopsies showed chronic active colitis, no dysplasia    EYE SURGERY     clear lense extraction 1998     FLEXIBLE SIGMOIDOSCOPY N/A 12/28/2014   Procedure: FLEXIBLE SIGMOIDOSCOPY WITH PROPOFOL;  Surgeon: Rogene Houston, MD;  Location: [facility];  Service: Endoscopy;  Laterality: N/A;     Current Outpatient Medications  Medication Sig Dispense Refill   amLODipine (NORVASC) 2.5 MG tablet Take 1 tablet (2.5 mg total) by mouth daily. 90 tablet 1   antiseptic oral rinse (BIOTENE) LIQD 1 application by Mouth Rinse route daily. FOR DRY MOUTH     benazepril-hydrochlorthiazide (LOTENSIN HCT) 20-12.5 MG tablet Take 2 tablets by mouth daily. TAKE (2) TABLETS  BY MOUTH ONCE DAILY. 180 tablet 3   Blood Glucose Monitoring Suppl (ONETOUCH VERIO FLEX SYSTEM) w/Device KIT USE AS DIRECTED WITH TESTING SUPPLIES UP TO 4 TIMES A DAY 1 kit 0   busPIRone (BUSPAR) 10 MG tablet Take 1 tablet (10 mg total) by mouth 3 (three) times daily. 60 tablet 2   co-enzyme Q-10 50 MG capsule Take 50 mg by mouth once a week.     dicyclomine (BENTYL) 20 MG tablet Take 1/2-1 tablets (10-20 mg total) by mouth daily. 90 tablet 1   empagliflozin (JARDIANCE) 10 MG TABS tablet Take 1 tablet (10 mg total) by mouth  daily before breakfast. 90 tablet 1   escitalopram (LEXAPRO) 20 MG tablet Take 1 tablet (20 mg total) by mouth daily. 90 tablet 1   glucose blood (ONETOUCH VERIO) test strip Test BS up to 4 times daily or as directed Dx E11.22 400 strip 3   hydrocortisone (ANUSOL-HC) 2.5 % rectal cream PLACE 1 APPLICATION RECTALLY 2 TIMES A DAY 30 g 5   Lancets (ONETOUCH DELICA PLUS FGBMSX11B) MISC CHECK BLOOD SUGAR UP TO 4 TIMES A DAY OR AS DIRECTED 100 each 0   Loperamide HCl (IMODIUM PO) Take 1 tablet by mouth daily as needed (diarrhea).     methocarbamol (ROBAXIN) 500 MG tablet TAKE ONE TABLET EVERY 6 HOURS AS NEEDED FOR MUSCLE SPASMS 30 tablet 1   Multiple Vitamins-Minerals (WOMENS MULTI VITAMIN & MINERAL PO) Take 1 tablet by mouth every other day.     Omega-3 Fatty Acids (FISH OIL) 1000 MG CPDR Take 3 capsules by mouth daily.      ondansetron  (ZOFRAN) 4 MG tablet TAKE ONE TABLET EVERY 8 HOURS AS NEEDED FOR NAUSEA AND VOMITING 20 tablet 0   oxyCODONE (ROXICODONE) 5 MG immediate release tablet Take 1 tablet (5 mg total) by mouth 2 (two) times daily. 60 tablet 0   oxyCODONE (ROXICODONE) 5 MG immediate release tablet Take 1 tablet (5 mg total) by mouth 2 (two) times daily. 60 tablet 0   oxyCODONE (ROXICODONE) 5 MG immediate release tablet Take 1 tablet (5 mg total) by mouth 2 (two) times daily. 60 tablet 0   Oxycodone HCl 10 MG TABS Take 0.5 tablets (5 mg total) by mouth 2 (two) times daily. 30 tablet 0   polyethylene glycol (MIRALAX / GLYCOLAX) 17 g packet Take 1 packet daily with 8 ounces of water as needed for constipation. 14 each 0   Probiotic Product (PROBIOTIC FORMULA PO) Take 1-2 capsules by mouth every morning.     rosuvastatin (CRESTOR) 10 MG tablet Take 1 tablet (10 mg total) by mouth daily. 90 tablet 1   shark liver oil-cocoa butter (PREPARATION H) 0.25-3-85.5 % suppository Place 1 suppository rectally as needed for hemorrhoids.     traZODone (DESYREL) 50 MG tablet Take 0.5-1 tablets (25-50 mg total) by mouth at bedtime as needed for sleep. 90 tablet 1   vitamin E 400 UNIT capsule Take 400 Units by mouth every morning.     No current facility-administered medications for this visit.    Allergies:   Azathioprine, Tape, Codeine, Milk (cow), Penicillins, Povidone-iodine, Procaine hcl, Sulfonamide derivatives, Tramadol, Acyclovir and related, Azithromycin, Clonazepam, Dairy aid [tilactase], Doxycycline, Latex, and Niacin and related    ROS:  Please see the history of present illness.   Otherwise, review of systems are positive for ***.   All other systems are reviewed and negative.    PHYSICAL EXAM: VS:  There were no vitals taken for this visit. , BMI There is no height or weight on file to calculate BMI. GENERAL:  Well appearing NECK:  No jugular venous distention, waveform within normal limits, carotid upstroke brisk and  symmetric, no bruits, no thyromegaly LUNGS:  Clear to auscultation bilaterally CHEST:  Unremarkable HEART:  PMI not displaced or sustained,S1 and S2 within normal limits, no S3, no S4, no clicks, no rubs, *** murmurs ABD:  Flat, positive bowel sounds normal in frequency in pitch, no bruits, no rebound, no guarding, no midline pulsatile mass, no hepatomegaly, no splenomegaly EXT:  2 plus pulses throughout, no edema, no cyanosis no clubbing     ***  GENERAL:  Well appearing HEENT:  Pupils equal round and reactive, fundi not visualized, oral mucosa unremarkable NECK:  No jugular venous distention, waveform within normal limits, carotid upstroke brisk and symmetric, no bruits, no thyromegaly LYMPHATICS:  No cervical, inguinal adenopathy LUNGS:  Clear to auscultation bilaterally BACK:  No CVA tenderness CHEST:  Unremarkable HEART:  PMI not displaced or sustained,S1 and S2 within normal limits, no S3, no S4, no clicks, no rubs, no murmurs ABD:  Flat, positive bowel sounds normal in frequency in pitch, no bruits, no rebound, no guarding, no midline pulsatile mass, no hepatomegaly, no splenomegaly EXT:  2 plus pulses throughout, no edema, no cyanosis no clubbing SKIN:  No rashes no nodules NEURO:  Cranial nerves II through XII grossly intact, motor grossly intact throughout PSYCH:  Cognitively intact, oriented to person place and time    EKG:  EKG is *** ordered today. The ekg ordered today demonstrates low voltage in the limb in chest leads, sinus rhythm, rate 88, nonspecific T wave flattening, no acute ST-T wave changes.   Recent Labs: 10/09/2021: ALT 22; BUN 17; Creatinine, Ser 1.26; Hemoglobin 11.4; Platelets 297; Potassium 4.6; Sodium 139    Lipid Panel    Component Value Date/Time   CHOL 113 10/09/2021 1524   CHOL 200 (H) 08/01/2012 1100   TRIG 68 10/09/2021 1524   TRIG 294 (H) 06/08/2014 1639   TRIG 171 (H) 08/01/2012 1100   HDL 48 10/09/2021 1524   HDL 44 06/08/2014 1639    HDL 55 08/01/2012 1100   CHOLHDL 2.4 10/09/2021 1524   CHOLHDL 6.1 03/04/2007 0420   VLDL 26 03/04/2007 0420   LDLCALC 51 10/09/2021 1524   LDLCALC 81 12/28/2013 1106   LDLCALC 111 (H) 08/01/2012 1100      Wt Readings from Last 3 Encounters:  10/09/21 158 lb (71.7 kg)  07/09/21 149 lb (67.6 kg)  04/10/21 150 lb (68 kg)      Other studies Reviewed: Additional studies/ records that were reviewed today include: ***. Review of the above records demonstrates:  Please see elsewhere in the note.     ASSESSMENT AND PLAN:  CHEST PAIN:   ***    She does not have chest pain that she described previously.  No further work-up.  MURMUR:    ***  I   do not appreciate a heart murmur.  I would not suggest further testing is indicated.  HTN: Her blood pressure is *** not well controlled.  Of asked her to get up blood pressure diary.  I am going to increase benazepril HCT to 40/25. Marland Kitchen  She can double up on the pills she has until she has run out of these.   Current medicines are reviewed at length with the patient today.  The patient does not have concerns regarding medicines.  The following changes have been made:  ***  Labs/ tests ordered today include: ***  No orders of the defined types were placed in this encounter.    Disposition:   FU with me as ***   Signed, Minus Breeding, MD  01/25/2022 8:54 PM    [facility]

## 2022-01-26 ENCOUNTER — Encounter: Payer: Self-pay | Admitting: Nurse Practitioner

## 2022-01-26 ENCOUNTER — Ambulatory Visit (INDEPENDENT_AMBULATORY_CARE_PROVIDER_SITE_OTHER): Payer: Medicare Other | Admitting: Nurse Practitioner

## 2022-01-26 DIAGNOSIS — E1122 Type 2 diabetes mellitus with diabetic chronic kidney disease: Secondary | ICD-10-CM

## 2022-01-26 DIAGNOSIS — I10 Essential (primary) hypertension: Secondary | ICD-10-CM

## 2022-01-26 DIAGNOSIS — N1832 Chronic kidney disease, stage 3b: Secondary | ICD-10-CM | POA: Diagnosis not present

## 2022-01-26 DIAGNOSIS — F3342 Major depressive disorder, recurrent, in full remission: Secondary | ICD-10-CM

## 2022-01-26 DIAGNOSIS — K5150 Left sided colitis without complications: Secondary | ICD-10-CM

## 2022-01-26 DIAGNOSIS — I129 Hypertensive chronic kidney disease with stage 1 through stage 4 chronic kidney disease, or unspecified chronic kidney disease: Secondary | ICD-10-CM

## 2022-01-26 DIAGNOSIS — F5101 Primary insomnia: Secondary | ICD-10-CM

## 2022-01-26 DIAGNOSIS — Z7984 Long term (current) use of oral hypoglycemic drugs: Secondary | ICD-10-CM

## 2022-01-26 DIAGNOSIS — E785 Hyperlipidemia, unspecified: Secondary | ICD-10-CM

## 2022-01-26 DIAGNOSIS — M797 Fibromyalgia: Secondary | ICD-10-CM

## 2022-01-26 MED ORDER — OXYCODONE HCL 10 MG PO TABS: 5.0000 mg | ORAL_TABLET | Freq: Two times a day (BID) | ORAL | 0 refills | Status: DC

## 2022-01-26 MED ORDER — OXYCODONE HCL 5 MG PO TABS: 5.0000 mg | ORAL_TABLET | Freq: Two times a day (BID) | ORAL | 0 refills | Status: DC

## 2022-01-26 NOTE — Progress Notes (Signed)
Virtual Visit  Note Due to COVID-19 pandemic this visit was conducted virtually. This visit type was conducted due to national recommendations for restrictions regarding the COVID-19 Pandemic (e.g. social distancing, sheltering in place) in an effort to limit this patient's exposure and mitigate transmission in our community. All issues noted in this document were discussed and addressed.  A physical exam was not performed with this format.  I connected with [name] on 11:35 at home by telephone and verified that I am speaking with the correct person using two identifiers. [name] is currently located at home and no one is currently with no one during visit. The provider, Mary-Margaret Hassell Done, FNP is located in their office at time of visit.  I discussed the limitations, risks, security and privacy concerns of performing an evaluation and management service by telephone and the availability of in person appointments. I also discussed with the patient that there may be a patient responsible charge related to this service. The patient expressed understanding and agreed to proceed.   History and Present Illness:    Chief Complaint: No chief complaint on file.    HPI:  [name] is a 81 y.o. who identifies as a female who was assigned female at birth.   Social history: Lives with: by herself Work history: retired   Scientist, forensic in today for follow up of the following chronic medical issues:  1. Essential hypertension No c/o chest pain, sob or headache. BP Readings from Last 3 Encounters:  10/09/21 (!) 160/78  07/09/21 138/83  04/10/21 116/63     2. Hyperlipidemia with target LDL less than 100 Does not watch diet and does no dedicated exercise. Lab Results  Component Value Date   CHOL 113 10/09/2021   HDL 48 10/09/2021   LDLCALC 51 10/09/2021   TRIG 68 10/09/2021   CHOLHDL 2.4 10/09/2021     3. Type 2 diabetes mellitus with stage 3b chronic kidney disease, without  long-term current use of insulin (HCC) Fasting blood sygars are running aroun 120-150. No low blood sugars Lab Results  Component Value Date   HGBA1C 6.2 (H) 10/09/2021     4. Stage 3b chronic kidney disease ([place]) Lab Results  Component Value Date   CREATININE 1.26 (H) 10/09/2021     5. Fibromyalgia 6. ULCERATIVE COLITIS, LEFT SIDED Pain assessment: Cause of pain- ulcerative colitis and fibromyalgia Pain location- varies daily Pain on scale of 1-10- 5/10 Frequency- daily What increases pain-nothing What makes pain Better-rest helps Effects on ADL - none Any change in general medical condition-none  Current opioids rx- oxycodone # meds rx- 60 Effectiveness of current meds-helps Adverse reactions from pain meds-none Morphine equivalent- 10MME  Pill count performed-No Last drug screen - 04/10/21 ( high risk q55m moderate risk q650mlow risk yearly ) Urine drug [place] today- No [place]  If yes were their any concerning findings? - np   Overdose risk: 1    Pain contract signed on:10/13/21   7. Recurrent major depressive disorder, in full remission (HCOgdenIs on lexapro an dis doing well    01/26/2022    1:48 PM 01/19/2022   12:14 PM 10/09/2021    3:29 PM  Depression screen PHQ 2/9  Decreased Interest 2 0 0  Down, Depressed, Hopeless 0 0 0  PHQ - 2 Score 2 0 0  Altered sleeping 1  0  Tired, decreased energy 2  0  Change in appetite 0  0  Feeling bad or  failure about yourself  0  0  Trouble concentrating 0  0  Moving slowly or fidgety/restless 0  0  Suicidal thoughts 0  0  PHQ-9 Score 5  0  Difficult doing work/chores Somewhat difficult  Not difficult at all     8. Primary insomnia Is on trazadone and is sleeping about 7-8 hours a night   New complaints: None today  Allergies  Allergen Reactions   Azathioprine Nausea And Vomiting and Other (See Comments)    SEVERE NAUSEA AND VOMITING WITH CHEST PAIN-  PATIENT INSISTS NEVER TO BE  GIVEN ANYTHING SIMILAR TO THIS   Tape Other (See Comments) and Rash    BLISTERS AND SKIN TEARING   Codeine Nausea And Vomiting   Milk (Cow) Nausea And Vomiting   Penicillins Hives    Has patient had a PCN reaction causing immediate rash, facial/tongue/throat swelling, SOB or lightheadedness with hypotension: No Has patient had a PCN reaction causing severe rash involving mucus membranes or skin necrosis: No Has patient had a PCN reaction that required hospitalization: No Has patient had a PCN reaction occurring within the last 10 years: No If all of the above answers are "NO", then may proceed with Cephalosporin use.    Povidone-Iodine Hives    BETADINE   Procaine Hcl Other (See Comments)    SEVERE GI UPSET (NAUSEA,VOMITING AND DIARRHEA)   Sulfonamide Derivatives Hives   Tramadol Itching   Acyclovir And Related    Azithromycin     Mycins    Clonazepam Other (See Comments)    Skin burning, insomnia, diarrhea.   Dairy Aid [Tilactase]    Doxycycline Rash   Latex Rash   Niacin And Related Rash   Outpatient Encounter Medications as of 01/26/2022  Medication Sig   amLODipine (NORVASC) 2.5 MG tablet Take 1 tablet (2.5 mg total) by mouth daily.   antiseptic oral rinse (BIOTENE) LIQD 1 application by Mouth Rinse route daily. FOR DRY MOUTH   benazepril-hydrochlorthiazide (LOTENSIN HCT) 20-12.5 MG tablet Take 2 tablets by mouth daily. TAKE (2) TABLETS  BY MOUTH ONCE DAILY.   Blood Glucose Monitoring Suppl (ONETOUCH VERIO FLEX SYSTEM) w/Device KIT USE AS DIRECTED WITH TESTING SUPPLIES UP TO 4 TIMES A DAY   busPIRone (BUSPAR) 10 MG tablet Take 1 tablet (10 mg total) by mouth 3 (three) times daily.   co-enzyme Q-10 50 MG capsule Take 50 mg by mouth once a week.   dicyclomine (BENTYL) 20 MG tablet Take 1/2-1 tablets (10-20 mg total) by mouth daily.   empagliflozin (JARDIANCE) 10 MG TABS tablet Take 1 tablet (10 mg total) by mouth daily before breakfast.   escitalopram (LEXAPRO) 20 MG tablet  Take 1 tablet (20 mg total) by mouth daily.   glucose blood (ONETOUCH VERIO) test strip Test BS up to 4 times daily or as directed Dx E11.22   hydrocortisone (ANUSOL-HC) 2.5 % rectal cream PLACE 1 APPLICATION RECTALLY 2 TIMES A DAY   Lancets (ONETOUCH DELICA PLUS [id number]) MISC CHECK BLOOD SUGAR UP TO 4 TIMES A DAY OR AS DIRECTED   Loperamide HCl (IMODIUM PO) Take 1 tablet by mouth daily as needed (diarrhea).   methocarbamol (ROBAXIN) 500 MG tablet TAKE ONE TABLET EVERY 6 HOURS AS NEEDED FOR MUSCLE SPASMS   Multiple Vitamins-Minerals (WOMENS MULTI VITAMIN & MINERAL PO) Take 1 tablet by mouth every other day.   Omega-3 Fatty Acids (FISH OIL) 1000 MG CPDR Take 3 capsules by mouth daily.    ondansetron (ZOFRAN) 4 MG tablet TAKE  ONE TABLET EVERY 8 HOURS AS NEEDED FOR NAUSEA AND VOMITING   oxyCODONE (ROXICODONE) 5 MG immediate release tablet Take 1 tablet (5 mg total) by mouth 2 (two) times daily.   oxyCODONE (ROXICODONE) 5 MG immediate release tablet Take 1 tablet (5 mg total) by mouth 2 (two) times daily.   oxyCODONE (ROXICODONE) 5 MG immediate release tablet Take 1 tablet (5 mg total) by mouth 2 (two) times daily.   Oxycodone HCl 10 MG TABS Take 0.5 tablets (5 mg total) by mouth 2 (two) times daily.   polyethylene glycol (MIRALAX / GLYCOLAX) 17 g packet Take 1 packet daily with 8 ounces of water as needed for constipation.   Probiotic Product (PROBIOTIC FORMULA PO) Take 1-2 capsules by mouth every morning.   rosuvastatin (CRESTOR) 10 MG tablet Take 1 tablet (10 mg total) by mouth daily.   shark liver oil-cocoa butter (PREPARATION H) 0.25-3-85.5 % suppository Place 1 suppository rectally as needed for hemorrhoids.   traZODone (DESYREL) 50 MG tablet Take 0.5-1 tablets (25-50 mg total) by mouth at bedtime as needed for sleep.   vitamin E 400 UNIT capsule Take 400 Units by mouth every morning.   [DISCONTINUED] sodium chloride (OCEAN) 0.65 % SOLN nasal spray Place 2 sprays into the nose as needed for  congestion.   No facility-administered encounter medications on file as of 01/26/2022.    Past Surgical History:  Procedure Laterality Date   ABDOMINAL HYSTERECTOMY     BIOPSY N/A 12/28/2014   Procedure: SIGMOID AND RECTAL BIOPSIES;  Surgeon: Rogene Houston, MD;  Location: [facility];  Service: Endoscopy;  Laterality: N/A;   CHOLECYSTECTOMY     s/p   COLONOSCOPY  06/16/06. 04/27/2007   proctitis seen, biopsies showed chronic active colitis, no dysplasia    EYE SURGERY     clear lense extraction 1998    FLEXIBLE SIGMOIDOSCOPY N/A 12/28/2014   Procedure: FLEXIBLE SIGMOIDOSCOPY WITH PROPOFOL;  Surgeon: Rogene Houston, MD;  Location: [facility];  Service: Endoscopy;  Laterality: N/A;    Family History  Problem Relation Age of Onset   Heart attack Mother 40       Died in her sleep   Diabetes Mother    Heart attack Father 33       Died in her sleep   Cancer Sister        liver, breast    Heart disease Sister    Breast cancer Maternal Aunt    Breast cancer Maternal Aunt    Heart attack Brother 36       Defib, CABG   Diabetes Brother    Kidney disease Brother        dyalasis        Review of Systems  Constitutional:  Negative for diaphoresis and weight loss.  Eyes:  Negative for blurred vision, double vision and pain.  Respiratory:  Negative for shortness of breath.   Cardiovascular:  Negative for chest pain, palpitations, orthopnea and leg swelling.  Gastrointestinal:  Negative for abdominal pain.  Musculoskeletal:  Positive for myalgias.  Skin:  Negative for rash.  Neurological:  Negative for dizziness, sensory change, loss of consciousness, weakness and headaches.  Endo/Heme/Allergies:  Negative for polydipsia. Does not bruise/bleed easily.  Psychiatric/Behavioral:  Negative for memory loss. The patient does not have insomnia.   All other systems reviewed and are negative.    Observations/Objective: Alert and oriented- answers all questions appropriately No  distress   Assessment and Plan: [name] comes in today with  chief complaint of No chief complaint on file.   Diagnosis and orders addressed:  1. Essential hypertension Low sodium diet  2. Hyperlipidemia with target LDL less than 100 Low fat diet  3. Type 2 diabetes mellitus with stage 3b chronic kidney disease, without long-term current use of insulin (HCC) Watch carbs in diet  4. Stage 3b chronic kidney disease ([place]) Will do labs at next visit  5. Fibromyalgia rest - oxyCODONE (ROXICODONE) 5 MG immediate release tablet; Take 1 tablet (5 mg total) by mouth 2 (two) times daily.  Dispense: 60 tablet; Refill: 0 - oxyCODONE (ROXICODONE) 5 MG immediate release tablet; Take 1 tablet (5 mg total) by mouth 2 (two) times daily.  Dispense: 60 tablet; Refill: 0  6. ULCERATIVE COLITIS, LEFT SIDED Watch diet  7. Recurrent major depressive disorder, in full remission ([place]) Stress management  8. Primary insomnia Bedtime routine   Labs pending Health Maintenance reviewed Diet and exercise encouraged  Follow up plan: 3 months    I discussed the assessment and treatment plan with the patient. The patient was provided an opportunity to ask questions and all were answered. The patient agreed with the plan and demonstrated an understanding of the instructions.   The patient was advised to call back or seek an in-person evaluation if the symptoms worsen or if the condition fails to improve as anticipated.  The above assessment and management plan was discussed with the patient. The patient verbalized understanding of and has agreed to the management plan. Patient is aware to call the clinic if symptoms persist or worsen. Patient is aware when to return to the clinic for a follow-up visit. Patient educated on when it is appropriate to go to the emergency department.   Time call ended:    I provided 20 minutes of  non face-to-face time during this encounter.    Mary-Margaret  Hassell Done, FNP

## 2022-01-26 NOTE — Progress Notes (Signed)
Subjective:    Patient ID: [name], female    DOB: 10/15/1940, 81 y.o.   MRN: [id number]   Chief Complaint: medical management of chronic issues     HPI:  Rian Koon is a 81 y.o. who identifies as a female who was assigned female at birth.   Social history: Lives with: by herself- family checks on her often Work history: retored   Comes in today for follow up of the following chronic medical issues:  1. Essential hypertension No c/o chest pain, sob or headache. Does not check blood pressure at home. BP Readings from Last 3 Encounters:  10/09/21 (!) 160/78  07/09/21 138/83  04/10/21 116/63     2. Hyperlipidemia with target LDL less than 100 Does not watch diet and does no dedicated exercise. Lab Results  Component Value Date   CHOL 113 10/09/2021   HDL 48 10/09/2021   LDLCALC 51 10/09/2021   TRIG 68 10/09/2021   CHOLHDL 2.4 10/09/2021     3. Type 2 diabetes mellitus with stage 3b chronic kidney disease, without long-term current use of insulin (HCC) Fasting blood sugars are running around120-140 Lab Results  Component Value Date   HGBA1C 6.2 (H) 10/09/2021     4. Stage 3b chronic kidney disease ([place]) Lab Results  Component Value Date   CREATININE 1.26 (H) 10/09/2021     5. Fibromyalgia 6. ULCERATIVE COLITIS, LEFT SIDED Pain assessment: Cause of pain- ulcerative colitis and fibromyalgia Pain location- varies form day to day Pain on scale of 1-10- varies form day to day Frequency- daily What increases pain-nothing really What makes pain Better-rest helsp Effects on ADL - none Any change in general medical condition-none  Current opioids rx- oxycodone BID # meds rx- 60 Effectiveness of current meds-helps Adverse reactions from pain meds-none Morphine equivalent- 10MME  Pill count performed-No Last drug screen - 04/10/21 ( high risk q76m moderate risk q624mlow risk yearly ) Urine drug screen today- No [place]  If yes  were their any concerning findings? - no   Overdose risk: 1   Pain contract signed on:09/23/21   7. Recurrent major depressive disorder, in full remission (HCBoykinIs on lexapro and is dong well/  8. Primary insomnia Takes trazadone to sleep at night. Sleeps about 7 hours a night   New complaints: None today  Allergies  Allergen Reactions   Azathioprine Nausea And Vomiting and Other (See Comments)    SEVERE NAUSEA AND VOMITING WITH CHEST PAIN-  PATIENT INSISTS NEVER TO BE GIVEN ANYTHING SIMILAR TO THIS   Tape Other (See Comments) and Rash    BLISTERS AND SKIN TEARING   Codeine Nausea And Vomiting   Milk (Cow) Nausea And Vomiting   Penicillins Hives    Has patient had a PCN reaction causing immediate rash, facial/tongue/throat swelling, SOB or lightheadedness with hypotension: No Has patient had a PCN reaction causing severe rash involving mucus membranes or skin necrosis: No Has patient had a PCN reaction that required hospitalization: No Has patient had a PCN reaction occurring within the last 10 years: No If all of the above answers are "NO", then may proceed with Cephalosporin use.    Povidone-Iodine Hives    BETADINE   Procaine Hcl Other (See Comments)    SEVERE GI UPSET (NAUSEA,VOMITING AND DIARRHEA)   Sulfonamide Derivatives Hives   Tramadol Itching   Acyclovir And Related    Azithromycin     Mycins    Clonazepam Other (See  Comments)    Skin burning, insomnia, diarrhea.   Dairy Aid [Tilactase]    Doxycycline Rash   Latex Rash   Niacin And Related Rash   Outpatient Encounter Medications as of 01/26/2022  Medication Sig   amLODipine (NORVASC) 2.5 MG tablet Take 1 tablet (2.5 mg total) by mouth daily.   antiseptic oral rinse (BIOTENE) LIQD 1 application by Mouth Rinse route daily. FOR DRY MOUTH   benazepril-hydrochlorthiazide (LOTENSIN HCT) 20-12.5 MG tablet Take 2 tablets by mouth daily. TAKE (2) TABLETS  BY MOUTH ONCE DAILY.   Blood Glucose Monitoring Suppl  (ONETOUCH VERIO FLEX SYSTEM) w/Device KIT USE AS DIRECTED WITH TESTING SUPPLIES UP TO 4 TIMES A DAY   busPIRone (BUSPAR) 10 MG tablet Take 1 tablet (10 mg total) by mouth 3 (three) times daily.   co-enzyme Q-10 50 MG capsule Take 50 mg by mouth once a week.   dicyclomine (BENTYL) 20 MG tablet Take 1/2-1 tablets (10-20 mg total) by mouth daily.   empagliflozin (JARDIANCE) 10 MG TABS tablet Take 1 tablet (10 mg total) by mouth daily before breakfast.   escitalopram (LEXAPRO) 20 MG tablet Take 1 tablet (20 mg total) by mouth daily.   glucose blood (ONETOUCH VERIO) test strip Test BS up to 4 times daily or as directed Dx E11.22   hydrocortisone (ANUSOL-HC) 2.5 % rectal cream PLACE 1 APPLICATION RECTALLY 2 TIMES A DAY   Lancets (ONETOUCH DELICA PLUS [id number]) MISC CHECK BLOOD SUGAR UP TO 4 TIMES A DAY OR AS DIRECTED   Loperamide HCl (IMODIUM PO) Take 1 tablet by mouth daily as needed (diarrhea).   methocarbamol (ROBAXIN) 500 MG tablet TAKE ONE TABLET EVERY 6 HOURS AS NEEDED FOR MUSCLE SPASMS   Multiple Vitamins-Minerals (WOMENS MULTI VITAMIN & MINERAL PO) Take 1 tablet by mouth every other day.   Omega-3 Fatty Acids (FISH OIL) 1000 MG CPDR Take 3 capsules by mouth daily.    ondansetron (ZOFRAN) 4 MG tablet TAKE ONE TABLET EVERY 8 HOURS AS NEEDED FOR NAUSEA AND VOMITING   oxyCODONE (ROXICODONE) 5 MG immediate release tablet Take 1 tablet (5 mg total) by mouth 2 (two) times daily.   oxyCODONE (ROXICODONE) 5 MG immediate release tablet Take 1 tablet (5 mg total) by mouth 2 (two) times daily.   oxyCODONE (ROXICODONE) 5 MG immediate release tablet Take 1 tablet (5 mg total) by mouth 2 (two) times daily.   Oxycodone HCl 10 MG TABS Take 0.5 tablets (5 mg total) by mouth 2 (two) times daily.   polyethylene glycol (MIRALAX / GLYCOLAX) 17 g packet Take 1 packet daily with 8 ounces of water as needed for constipation.   Probiotic Product (PROBIOTIC FORMULA PO) Take 1-2 capsules by mouth every morning.    rosuvastatin (CRESTOR) 10 MG tablet Take 1 tablet (10 mg total) by mouth daily.   shark liver oil-cocoa butter (PREPARATION H) 0.25-3-85.5 % suppository Place 1 suppository rectally as needed for hemorrhoids.   traZODone (DESYREL) 50 MG tablet Take 0.5-1 tablets (25-50 mg total) by mouth at bedtime as needed for sleep.   vitamin E 400 UNIT capsule Take 400 Units by mouth every morning.   [DISCONTINUED] sodium chloride (OCEAN) 0.65 % SOLN nasal spray Place 2 sprays into the nose as needed for congestion.   No facility-administered encounter medications on file as of 01/26/2022.    Past Surgical History:  Procedure Laterality Date   ABDOMINAL HYSTERECTOMY     BIOPSY N/A 12/28/2014   Procedure: SIGMOID AND RECTAL BIOPSIES;  Surgeon: Bernadene Person  Gloriann Loan, MD;  Location: [facility];  Service: Endoscopy;  Laterality: N/A;   CHOLECYSTECTOMY     s/p   COLONOSCOPY  06/16/06. 04/27/2007   proctitis seen, biopsies showed chronic active colitis, no dysplasia    EYE SURGERY     clear lense extraction 1998    FLEXIBLE SIGMOIDOSCOPY N/A 12/28/2014   Procedure: FLEXIBLE SIGMOIDOSCOPY WITH PROPOFOL;  Surgeon: Rogene Houston, MD;  Location: [facility];  Service: Endoscopy;  Laterality: N/A;    Family History  Problem Relation Age of Onset   Heart attack Mother 42       Died in her sleep   Diabetes Mother    Heart attack Father 14       Died in her sleep   Cancer Sister        liver, breast    Heart disease Sister    Breast cancer Maternal Aunt    Breast cancer Maternal Aunt    Heart attack Brother 66       Defib, CABG   Diabetes Brother    Kidney disease Brother        dyalasis       Controlled substance contract: n/a     Review of Systems  Constitutional:  Negative for diaphoresis.  Eyes:  Negative for pain.  Respiratory:  Negative for shortness of breath.   Cardiovascular:  Negative for chest pain, palpitations and leg swelling.  Gastrointestinal:  Negative for abdominal pain.  Endocrine:  Negative for polydipsia.  Musculoskeletal:  Positive for myalgias.  Skin:  Negative for rash.  Neurological:  Negative for dizziness, weakness and headaches.  Hematological:  Does not bruise/bleed easily.       Objective:   Physical Exam        Assessment & Plan:

## 2022-01-28 ENCOUNTER — Ambulatory Visit: Payer: Medicare Other | Admitting: Cardiology

## 2022-01-28 ENCOUNTER — Other Ambulatory Visit: Payer: Self-pay | Admitting: Nurse Practitioner

## 2022-01-28 DIAGNOSIS — R011 Cardiac murmur, unspecified: Secondary | ICD-10-CM

## 2022-01-28 DIAGNOSIS — I10 Essential (primary) hypertension: Secondary | ICD-10-CM

## 2022-01-28 DIAGNOSIS — R072 Precordial pain: Secondary | ICD-10-CM

## 2022-01-28 DIAGNOSIS — F3342 Major depressive disorder, recurrent, in full remission: Secondary | ICD-10-CM

## 2022-01-28 DIAGNOSIS — F5101 Primary insomnia: Secondary | ICD-10-CM

## 2022-02-20 ENCOUNTER — Telehealth: Payer: Self-pay | Admitting: Nurse Practitioner

## 2022-02-20 DIAGNOSIS — M797 Fibromyalgia: Secondary | ICD-10-CM

## 2022-02-20 MED ORDER — OXYCODONE HCL 5 MG PO TABS: 5.0000 mg | ORAL_TABLET | Freq: Two times a day (BID) | ORAL | 0 refills | Status: DC

## 2022-02-20 NOTE — Telephone Encounter (Signed)
Please send Oxy Rx to [facility] is out of medication

## 2022-02-20 NOTE — Addendum Note (Signed)
Addended by: [name] on: 02/20/2022 04:46 PM   Modules accepted: Orders

## 2022-02-23 ENCOUNTER — Telehealth: Payer: Self-pay

## 2022-02-23 ENCOUNTER — Telehealth: Payer: Medicare Other

## 2022-02-23 NOTE — Telephone Encounter (Signed)
   CCM RN Visit Note   02-23-2022 Name: [name] MRN: [id number]      DOB: 12/21/1940  Subjective: [name] is a 81 y.o. year old female who is a primary care patient of [name]. The patient was referred to the Chronic Care Management team for assistance with care management needs subsequent to provider initiation of CCM services and plan of care.      An unsuccessful telephone outreach was attempted today to contact the patient about Chronic Care Management needs.    Plan:A HIPAA compliant phone message was left for the patient providing contact information and requesting a return call.  Noreene Larsson RN, MSN, CCM RN Care Manager  Chronic Care Management Direct Number: [phone]

## 2022-02-25 ENCOUNTER — Ambulatory Visit: Payer: Medicare Other | Admitting: Cardiology

## 2022-03-02 ENCOUNTER — Telehealth: Payer: Self-pay

## 2022-03-02 NOTE — Progress Notes (Signed)
  Chronic Care Management Note  03/02/2022 Name: [name] MRN: [id number] DOB: September 03, 1940  [name] is a 81 y.o. year old female who is a primary care patient of [name], FNP and is actively engaged with the care management team. I reached out to [name] by phone today to assist with re-scheduling a follow up visit with the RN Case Manager  Follow up plan: Unsuccessful telephone outreach attempt made. A HIPAA compliant phone message was left for the patient providing contact information and requesting a return call.  The care management team will reach out to the patient again over the next 7 days.  If patient returns call to provider office, please advise to call [facility]  at [address], [place], [place] [address] Direct Dial: [phone] [email or web address]

## 2022-03-10 ENCOUNTER — Other Ambulatory Visit: Payer: Self-pay | Admitting: Nurse Practitioner

## 2022-03-10 DIAGNOSIS — M797 Fibromyalgia: Secondary | ICD-10-CM

## 2022-03-10 DIAGNOSIS — K5150 Left sided colitis without complications: Secondary | ICD-10-CM

## 2022-03-10 NOTE — Progress Notes (Signed)
  Chronic Care Management Note  03/10/2022 Name: [name] MRN: [id number] DOB: 05/25/1940  [name] is a 81 y.o. year old female who is a primary care patient of [name], FNP and is actively engaged with the care management team. I reached out to [name] by phone today to assist with re-scheduling a follow up visit with the RN Case Manager  Follow up plan: The care management team will reach out to the patient again over the next 21 days.  If patient returns call to provider office, please advise to call [facility]  at [address], [place], [place] [address] Direct Dial: [phone] [email or web address]

## 2022-03-12 ENCOUNTER — Other Ambulatory Visit: Payer: Self-pay | Admitting: *Deleted

## 2022-03-12 DIAGNOSIS — E1122 Type 2 diabetes mellitus with diabetic chronic kidney disease: Secondary | ICD-10-CM

## 2022-03-18 ENCOUNTER — Other Ambulatory Visit: Payer: Self-pay | Admitting: Nurse Practitioner

## 2022-03-18 DIAGNOSIS — M797 Fibromyalgia: Secondary | ICD-10-CM

## 2022-03-24 ENCOUNTER — Other Ambulatory Visit: Payer: Self-pay | Admitting: Nurse Practitioner

## 2022-03-24 DIAGNOSIS — F5101 Primary insomnia: Secondary | ICD-10-CM

## 2022-04-03 NOTE — Progress Notes (Signed)
  Chronic Care Management Note  04/03/2022 Name: Kenora Spayd MRN: [id number] DOB: 03/20/1941  [name] is a 82 y.o. year old female who is a primary care patient of [name], FNP and is actively engaged with the care management team. I reached out to [name] by phone today to assist with re-scheduling a follow up visit with the RN Case Manager  Follow up plan: Patient declines further follow up and engagement by the care management team. Appropriate care team members and provider have been notified via electronic communication.   Noreene Larsson, [place], [place] [address] Direct Dial: [phone] [email or web address]

## 2022-04-16 ENCOUNTER — Telehealth: Payer: Self-pay | Admitting: Nurse Practitioner

## 2022-04-16 DIAGNOSIS — M797 Fibromyalgia: Secondary | ICD-10-CM

## 2022-04-16 MED ORDER — OXYCODONE HCL 5 MG PO TABS: ORAL_TABLET | ORAL | 0 refills | Status: DC

## 2022-04-16 NOTE — Telephone Encounter (Signed)
Please advise 

## 2022-04-17 ENCOUNTER — Other Ambulatory Visit: Payer: Self-pay | Admitting: Nurse Practitioner

## 2022-04-17 DIAGNOSIS — K5150 Left sided colitis without complications: Secondary | ICD-10-CM

## 2022-04-30 ENCOUNTER — Ambulatory Visit (INDEPENDENT_AMBULATORY_CARE_PROVIDER_SITE_OTHER): Payer: Medicare Other | Admitting: Nurse Practitioner

## 2022-04-30 ENCOUNTER — Encounter: Payer: Self-pay | Admitting: Nurse Practitioner

## 2022-04-30 VITALS — BP 140/86 | HR 93 | Temp 97.9°F | Resp 20 | Ht 69.0 in | Wt 166.0 lb

## 2022-04-30 DIAGNOSIS — E1122 Type 2 diabetes mellitus with diabetic chronic kidney disease: Secondary | ICD-10-CM

## 2022-04-30 DIAGNOSIS — E785 Hyperlipidemia, unspecified: Secondary | ICD-10-CM | POA: Diagnosis not present

## 2022-04-30 DIAGNOSIS — I10 Essential (primary) hypertension: Secondary | ICD-10-CM

## 2022-04-30 DIAGNOSIS — N1832 Chronic kidney disease, stage 3b: Secondary | ICD-10-CM

## 2022-04-30 DIAGNOSIS — R001 Bradycardia, unspecified: Secondary | ICD-10-CM

## 2022-04-30 DIAGNOSIS — F3342 Major depressive disorder, recurrent, in full remission: Secondary | ICD-10-CM

## 2022-04-30 DIAGNOSIS — M797 Fibromyalgia: Secondary | ICD-10-CM

## 2022-04-30 DIAGNOSIS — F5101 Primary insomnia: Secondary | ICD-10-CM

## 2022-04-30 DIAGNOSIS — K5150 Left sided colitis without complications: Secondary | ICD-10-CM

## 2022-04-30 LAB — BAYER DCA HB A1C WAIVED: HB A1C (BAYER DCA - WAIVED): 8.6 % — ABNORMAL HIGH (ref 4.8–5.6)

## 2022-04-30 MED ORDER — OXYCODONE HCL 5 MG PO TABS: 5.0000 mg | ORAL_TABLET | Freq: Two times a day (BID) | ORAL | 0 refills | Status: DC

## 2022-04-30 MED ORDER — BUSPIRONE HCL 10 MG PO TABS: 10.0000 mg | ORAL_TABLET | Freq: Three times a day (TID) | ORAL | 2 refills | Status: DC

## 2022-04-30 MED ORDER — BENAZEPRIL-HYDROCHLOROTHIAZIDE 20-12.5 MG PO TABS: 2.0000 | ORAL_TABLET | Freq: Every day | ORAL | 1 refills | Status: DC

## 2022-04-30 MED ORDER — DICYCLOMINE HCL 20 MG PO TABS: ORAL_TABLET | ORAL | 0 refills | Status: DC

## 2022-04-30 MED ORDER — AMLODIPINE BESYLATE 2.5 MG PO TABS: 2.5000 mg | ORAL_TABLET | Freq: Every day | ORAL | 1 refills | Status: DC

## 2022-04-30 MED ORDER — TRAZODONE HCL 50 MG PO TABS: ORAL_TABLET | ORAL | 1 refills | Status: DC

## 2022-04-30 MED ORDER — JANUVIA 100 MG PO TABS: 100.0000 mg | ORAL_TABLET | Freq: Every day | ORAL | 1 refills | Status: DC

## 2022-04-30 MED ORDER — ESCITALOPRAM OXALATE 20 MG PO TABS: 20.0000 mg | ORAL_TABLET | Freq: Every day | ORAL | 2 refills | Status: DC

## 2022-04-30 MED ORDER — ROSUVASTATIN CALCIUM 10 MG PO TABS: 10.0000 mg | ORAL_TABLET | Freq: Every day | ORAL | 1 refills | Status: DC

## 2022-04-30 NOTE — Progress Notes (Signed)
Subjective:    Patient ID: [name], female    DOB: 1941/02/22, 82 y.o.   MRN: [id number]   Chief Complaint: medical management of chronic issues     HPI:  [name] is a 82 y.o. who identifies as a female who was assigned female at birth.   Social history: Lives with: by herself- family checks on her daily Work history: retired   Scientist, forensic in today for follow up of the following chronic medical issues:  1. Essential hypertension No c/o chest pain, sob or headache. Does not check blood pressure at home. Patient has been out of her amlodipine for several months. BP Readings from Last 3 Encounters:  10/09/21 (!) 160/78  07/09/21 138/83  04/10/21 116/63     2. Type 2 diabetes mellitus with stage 3b chronic kidney disease, without long-term current use of insulin (HCC) Fasting blood sugars are running around 130-170. No low blood sugars.  Lab Results  Component Value Date   HGBA1C 6.2 (H) 10/09/2021     3. Hyperlipidemia with target LDL less than 100 Does not watch diet and does no dedicated exercise. Lab Results  Component Value Date   CHOL 113 10/09/2021   HDL 48 10/09/2021   LDLCALC 51 10/09/2021   TRIG 68 10/09/2021   CHOLHDL 2.4 10/09/2021     4. Stage 3b chronic kidney disease ([place]) Lab Results  Component Value Date   CREATININE 1.26 (H) 10/09/2021     5. ULCERATIVE COLITIS, LEFT SIDED Has daily pain from her colitis Pain assessment: Cause of pain- ulcerative colitis Pain location- lower abdomen Pain on scale of 1-10- 4-5/10 Frequency- daily What increases pain-nothing What makes pain Better-nothing Effects on ADL - none Any change in general medical condition-none  Current opioids rx- oxycodone '5mg'$  BID # meds rx- 60 Effectiveness of current meds-helps Adverse reactions from pain meds-none Morphine equivalent- unknown  Pill count performed-No Last drug screen - 04/10/21 ( high risk q76m moderate risk q642mlow risk yearly ) Urine drug  screen today- Yes [place]- yes  If yes were their any concerning findings? - no   Overdose risk: 1    Pain contract signed on:10/13/21-   6. Bradycardia No syncopal or near syncopal episodes  7. Recurrent major depressive disorder, in full [place] of lexapro and buspar. Is doing ok most days.    04/30/2022    3:20 PM 01/26/2022    1:48 PM 01/19/2022   12:14 PM  Depression screen PHQ 2/9  Decreased Interest 1 2 0  Down, Depressed, Hopeless 1 0 0  PHQ - 2 Score 2 2 0  Altered sleeping 3 1   Tired, decreased energy 2 2   Change in appetite 2 0   Feeling bad or failure about yourself  0 0   Trouble concentrating 0 0   Moving slowly or fidgety/restless 0 0   Suicidal thoughts 0 0   PHQ-9 Score 9 5   Difficult doing work/chores Somewhat difficult Somewhat difficult      8. Fibromyalgia Chronic pain. Varies from day to day. Pain meds help-   9. Primary insomnia Trazadone to sleep at night. Sleeps about 2-3 hours at a tie she says she dont sleep well.   New complaints: None today  Allergies  Allergen Reactions   Azathioprine Nausea And Vomiting and Other (See Comments)    SEVERE NAUSEA AND VOMITING WITH CHEST PAIN-  PATIENT INSISTS NEVER TO BE GIVEN ANYTHING SIMILAR TO  THIS   Tape Other (See Comments) and Rash    BLISTERS AND SKIN TEARING   Codeine Nausea And Vomiting   Milk (Cow) Nausea And Vomiting   Penicillins Hives    Has patient had a PCN reaction causing immediate rash, facial/tongue/throat swelling, SOB or lightheadedness with hypotension: No Has patient had a PCN reaction causing severe rash involving mucus membranes or skin necrosis: No Has patient had a PCN reaction that required hospitalization: No Has patient had a PCN reaction occurring within the last 10 years: No If all of the above answers are "NO", then may proceed with Cephalosporin use.    Povidone-Iodine Hives    BETADINE   Procaine Hcl Other (See  Comments)    SEVERE GI UPSET (NAUSEA,VOMITING AND DIARRHEA)   Sulfonamide Derivatives Hives   Tramadol Itching   Acyclovir And Related    Azithromycin     Mycins    Clonazepam Other (See Comments)    Skin burning, insomnia, diarrhea.   Dairy Aid [Tilactase]    Doxycycline Rash   Latex Rash   Niacin And Related Rash   Outpatient Encounter Medications as of 04/30/2022  Medication Sig   amLODipine (NORVASC) 2.5 MG tablet Take 1 tablet (2.5 mg total) by mouth daily.   antiseptic oral rinse (BIOTENE) LIQD 1 application by Mouth Rinse route daily. FOR DRY MOUTH   benazepril-hydrochlorthiazide (LOTENSIN HCT) 20-12.5 MG tablet Take 2 tablets by mouth daily. TAKE (2) TABLETS  BY MOUTH ONCE DAILY.   Blood Glucose Monitoring Suppl (ONETOUCH VERIO FLEX SYSTEM) w/Device KIT USE AS DIRECTED WITH TESTING SUPPLIES UP TO 4 TIMES A DAY   busPIRone (BUSPAR) 10 MG tablet Take 1 tablet (10 mg total) by mouth 3 (three) times daily.   co-enzyme Q-10 50 MG capsule Take 50 mg by mouth once a week.   dicyclomine (BENTYL) 20 MG tablet TAKE 1/2 TO 1 TABLET (10-20 MG TOTAL) BY MOUTH DAILY.   empagliflozin (JARDIANCE) 10 MG TABS tablet Take 1 tablet (10 mg total) by mouth daily before breakfast.   escitalopram (LEXAPRO) 20 MG tablet TAKE 1 TABLET ONCE DAILY.   glucose blood (ONETOUCH VERIO) test strip Test BS up to 4 times daily or as directed Dx E11.22   hydrocortisone (ANUSOL-HC) 2.5 % rectal cream PLACE 1 APPLICATION RECTALLY 2 TIMES A DAY   Lancets (ONETOUCH DELICA PLUS [id number]) MISC CHECK BLOOD SUGAR UP TO 4 TIMES A DAY OR AS DIRECTED   Loperamide HCl (IMODIUM PO) Take 1 tablet by mouth daily as needed (diarrhea).   methocarbamol (ROBAXIN) 500 MG tablet TAKE 1 TABLET EVERY 6 HOURS AS NEEDED FOR MUSCLE SPASMS   Multiple Vitamins-Minerals (WOMENS MULTI VITAMIN & MINERAL PO) Take 1 tablet by mouth every other day.   Omega-3 Fatty Acids (FISH OIL) 1000 MG CPDR Take 3 capsules by mouth daily.    ondansetron  (ZOFRAN) 4 MG tablet TAKE ONE TABLET EVERY 8 HOURS AS NEEDED FOR NAUSEA AND VOMITING   oxyCODONE (OXY IR/ROXICODONE) 5 MG immediate release tablet TAKE (1) TABLET TWICE DAILY.   polyethylene glycol (MIRALAX / GLYCOLAX) 17 g packet Take 1 packet daily with 8 ounces of water as needed for constipation.   Probiotic Product (PROBIOTIC FORMULA PO) Take 1-2 capsules by mouth every morning.   rosuvastatin (CRESTOR) 10 MG tablet Take 1 tablet (10 mg total) by mouth daily.   shark liver oil-cocoa butter (PREPARATION H) 0.25-3-85.5 % suppository Place 1 suppository rectally as needed for hemorrhoids.   traZODone (DESYREL) 50 MG tablet  TAKE 1/2 TO 1 TABLET (25- '50MG'$  TOTAL) BY MOUTH AT BEDTIME AS NEEDED FOR SLEEP.   vitamin E 400 UNIT capsule Take 400 Units by mouth every morning.   [DISCONTINUED] sodium chloride (OCEAN) 0.65 % SOLN nasal spray Place 2 sprays into the nose as needed for congestion.   No facility-administered encounter medications on file as of 04/30/2022.    Past Surgical History:  Procedure Laterality Date   ABDOMINAL HYSTERECTOMY     BIOPSY N/A 12/28/2014   Procedure: SIGMOID AND RECTAL BIOPSIES;  Surgeon: Rogene Houston, MD;  Location: [facility];  Service: Endoscopy;  Laterality: N/A;   CHOLECYSTECTOMY     s/p   COLONOSCOPY  06/16/06. 04/27/2007   proctitis seen, biopsies showed chronic active colitis, no dysplasia    EYE SURGERY     clear lense extraction 1998    FLEXIBLE SIGMOIDOSCOPY N/A 12/28/2014   Procedure: FLEXIBLE SIGMOIDOSCOPY WITH PROPOFOL;  Surgeon: Rogene Houston, MD;  Location: [facility];  Service: Endoscopy;  Laterality: N/A;    Family History  Problem Relation Age of Onset   Heart attack Mother 24       Died in her sleep   Diabetes Mother    Heart attack Father 87       Died in her sleep   Cancer Sister        liver, breast    Heart disease Sister    Breast cancer Maternal Aunt    Breast cancer Maternal Aunt    Heart attack Brother 10       Defib, CABG    Diabetes Brother    Kidney disease Brother        dyalasis       Controlled substance contract: n/a     Review of Systems  Constitutional:  Negative for diaphoresis.  Eyes:  Negative for pain.  Respiratory:  Negative for shortness of breath.   Cardiovascular:  Negative for chest pain, palpitations and leg swelling.  Gastrointestinal:  Negative for abdominal pain.  Endocrine: Negative for polydipsia.  Skin:  Negative for rash.  Neurological:  Negative for dizziness, weakness and headaches.  Hematological:  Does not bruise/bleed easily.  All other systems reviewed and are negative.      Objective:   Physical Exam Vitals and nursing note reviewed.  Constitutional:      General: She is not in acute distress.    Appearance: Normal appearance. She is well-developed.  HENT:     Head: Normocephalic.     Right Ear: Tympanic membrane normal.     Left Ear: Tympanic membrane normal.     Nose: Nose normal.     Mouth/Throat:     Mouth: Mucous membranes are moist.  Eyes:     Pupils: Pupils are equal, round, and reactive to light.  Neck:     Vascular: No carotid bruit or JVD.  Cardiovascular:     Rate and Rhythm: Normal rate and regular rhythm.     Heart sounds: Normal heart sounds.  Pulmonary:     Effort: Pulmonary effort is normal. No respiratory distress.     Breath sounds: Normal breath sounds. No wheezing or rales.  Chest:     Chest wall: No tenderness.  Abdominal:     General: Bowel sounds are normal. There is no distension or abdominal bruit.     Palpations: Abdomen is soft. There is no hepatomegaly, splenomegaly, mass or pulsatile mass.     Tenderness: There is abdominal tenderness (diffuse).  Musculoskeletal:  General: Normal range of motion.     Cervical back: Normal range of motion and neck supple.  Lymphadenopathy:     Cervical: No cervical adenopathy.  Skin:    General: Skin is warm and dry.  Neurological:     Mental Status: She is alert and oriented  to person, place, and time.     Deep Tendon Reflexes: Reflexes are normal and symmetric.  Psychiatric:        Behavior: Behavior normal.        Thought Content: Thought content normal.        Judgment: Judgment normal.    BP (!) 140/86   Pulse 93   Temp 97.9 F (36.6 C) (Temporal)   Resp 20   Ht '5\' 9"'$  (1.753 m)   Wt 166 lb (75.3 kg)   SpO2 94%   BMI 24.51 kg/m   Hgba1c 8.6%      Assessment & Plan:   [name] comes in today with chief complaint of Medical Management of Chronic Issues   Diagnosis and orders addressed:  1. Essential hypertension Low sodium diet - CBC with Differential/Platelet - CMP14+EGFR - amLODipine (NORVASC) 2.5 MG tablet; Take 1 tablet (2.5 mg total) by mouth daily.  Dispense: 90 tablet; Refill: 1 - benazepril-hydrochlorthiazide (LOTENSIN HCT) 20-12.5 MG tablet; Take 2 tablets by mouth daily. TAKE (2) TABLETS  BY MOUTH ONCE DAILY.  Dispense: 180 tablet; Refill: 1  2. Type 2 diabetes mellitus with stage 3b chronic kidney disease, without long-term current use of insulin (HCC) Continue to watch carbs in diet Back on diet- watch ccarbs in diet - Bayer DCA Hb A1c Waived - JANUVIA 100 MG tablet; Take 1 tablet (100 mg total) by mouth daily.  Dispense: 90 tablet; Refill: 1  3. Hyperlipidemia with target LDL less than 100 Low fat diet - Lipid panel  4. Stage 3b chronic kidney disease ([place]) labs pending  5. ULCERATIVE COLITIS, LEFT SIDED Watch diet to prevent flare up - dicyclomine (BENTYL) 20 MG tablet; TAKE 1/2 TO 1 TABLET (10-20 MG TOTAL) BY MOUTH DAILY.  Dispense: 30 tablet; Refill: 0 - rosuvastatin (CRESTOR) 10 MG tablet; Take 1 tablet (10 mg total) by mouth daily.  Dispense: 90 tablet; Refill: 1  6. Bradycardia Report any syncopal episode  7. Recurrent major depressive disorder, in full remission ([place]) Stress management - escitalopram (LEXAPRO) 20 MG tablet; Take 1 tablet (20 mg total) by mouth daily.  Dispense: 30 tablet; Refill: 2 -  busPIRone (BUSPAR) 10 MG tablet; Take 1 tablet (10 mg total) by mouth 3 (three) times daily.  Dispense: 60 tablet; Refill: 2  8. Fibromyalgia - oxyCODONE (OXY IR/ROXICODONE) 5 MG immediate release tablet; Take 1 tablet (5 mg total) by mouth in the morning and at bedtime. TAKE (1) TABLET TWICE DAILY.  Dispense: 60 tablet; Refill: 0 - oxyCODONE (OXY IR/ROXICODONE) 5 MG immediate release tablet; Take 1 tablet (5 mg total) by mouth in the morning and at bedtime. TAKE (1) TABLET TWICE DAILY.  Dispense: 60 tablet; Refill: 0 - oxyCODONE (OXY IR/ROXICODONE) 5 MG immediate release tablet; Take 1 tablet (5 mg total) by mouth in the morning and at bedtime. TAKE (1) TABLET TWICE DAILY.  Dispense: 60 tablet; Refill: 0  9. Primary insomnia Bedtime routine - traZODone (DESYREL) 50 MG tablet; TAKE 1/2 TO 1 TABLET (25- '50MG'$  TOTAL) BY MOUTH AT BEDTIME AS NEEDED FOR SLEEP.  Dispense: 90 tablet; Refill: 1   Labs pending Health Maintenance reviewed Diet and exercise encouraged  Follow  up plan: 3 months   Mary-Margaret Hassell Done, FNP

## 2022-05-01 ENCOUNTER — Telehealth: Payer: Self-pay | Admitting: Nurse Practitioner

## 2022-05-01 LAB — LIPID PANEL
Chol/HDL Ratio: 2.8 ratio (ref 0.0–4.4)
Cholesterol, Total: 111 mg/dL (ref 100–199)
HDL: 40 mg/dL (ref >39)
LDL Chol Calc (NIH): 41 mg/dL (ref 0–99)
Triglycerides: 182 mg/dL — ABNORMAL HIGH (ref 0–149)
VLDL Cholesterol Cal: 30 mg/dL (ref 5–40)

## 2022-05-01 LAB — CBC WITH DIFFERENTIAL/PLATELET
Basophils Absolute: 0.1 10*3/uL (ref 0.0–0.2)
Basos: 1 %
EOS (ABSOLUTE): 0.4 10*3/uL (ref 0.0–0.4)
Eos: 6 %
Hematocrit: 36.4 % (ref 34.0–46.6)
Hemoglobin: 12.3 g/dL (ref 11.1–15.9)
Immature Grans (Abs): 0 10*3/uL (ref 0.0–0.1)
Immature Granulocytes: 0 %
Lymphocytes Absolute: 1.4 10*3/uL (ref 0.7–3.1)
Lymphs: 22 %
MCH: 30.3 pg (ref 26.6–33.0)
MCHC: 33.8 g/dL (ref 31.5–35.7)
MCV: 90 fL (ref 79–97)
Monocytes Absolute: 0.6 10*3/uL (ref 0.1–0.9)
Monocytes: 9 %
Neutrophils Absolute: 3.9 10*3/uL (ref 1.4–7.0)
Neutrophils: 62 %
Platelets: 260 10*3/uL (ref 150–450)
RBC: 4.06 x10E6/uL (ref 3.77–5.28)
RDW: 13.2 % (ref 11.7–15.4)
WBC: 6.2 10*3/uL (ref 3.4–10.8)

## 2022-05-01 LAB — CMP14+EGFR
ALT: 15 IU/L (ref 0–32)
AST: 25 IU/L (ref 0–40)
Albumin/Globulin Ratio: 2 (ref 1.2–2.2)
Albumin: 4.3 g/dL (ref 3.8–4.8)
Alkaline Phosphatase: 61 IU/L (ref 44–121)
BUN/Creatinine Ratio: 14 (ref 12–28)
BUN: 18 mg/dL (ref 8–27)
Bilirubin Total: 0.4 mg/dL (ref 0.0–1.2)
CO2: 23 mmol/L (ref 20–29)
Calcium: 9.3 mg/dL (ref 8.7–10.3)
Chloride: 90 mmol/L — ABNORMAL LOW (ref 96–106)
Creatinine, Ser: 1.25 mg/dL — ABNORMAL HIGH (ref 0.57–1.00)
Globulin, Total: 2.2 g/dL (ref 1.5–4.5)
Glucose: 146 mg/dL — ABNORMAL HIGH (ref 70–99)
Potassium: 3.7 mmol/L (ref 3.5–5.2)
Sodium: 129 mmol/L — ABNORMAL LOW (ref 134–144)
Total Protein: 6.5 g/dL (ref 6.0–8.5)
eGFR: 44 mL/min/{1.73_m2} — ABNORMAL LOW (ref >59)

## 2022-05-01 MED ORDER — ONETOUCH DELICA PLUS LANCET33G MISC: 11 refills | Status: DC

## 2022-05-01 NOTE — Telephone Encounter (Signed)
Needles sent to pharmacy, patient aware

## 2022-05-01 NOTE — Telephone Encounter (Signed)
Pt called stating that she needs MMM to send her needles for her glucose machine to [facility].

## 2022-05-07 ENCOUNTER — Other Ambulatory Visit: Payer: Self-pay | Admitting: Nurse Practitioner

## 2022-05-07 DIAGNOSIS — K5150 Left sided colitis without complications: Secondary | ICD-10-CM

## 2022-05-11 ENCOUNTER — Other Ambulatory Visit: Payer: Self-pay | Admitting: Nurse Practitioner

## 2022-05-11 DIAGNOSIS — F5101 Primary insomnia: Secondary | ICD-10-CM

## 2022-05-11 DIAGNOSIS — K5150 Left sided colitis without complications: Secondary | ICD-10-CM

## 2022-05-12 ENCOUNTER — Other Ambulatory Visit: Payer: Self-pay | Admitting: Nurse Practitioner

## 2022-05-12 DIAGNOSIS — M797 Fibromyalgia: Secondary | ICD-10-CM

## 2022-05-22 ENCOUNTER — Other Ambulatory Visit: Payer: Self-pay | Admitting: Nurse Practitioner

## 2022-05-22 DIAGNOSIS — K5150 Left sided colitis without complications: Secondary | ICD-10-CM

## 2022-05-25 ENCOUNTER — Other Ambulatory Visit: Payer: Self-pay | Admitting: Nurse Practitioner

## 2022-05-25 DIAGNOSIS — M797 Fibromyalgia: Secondary | ICD-10-CM

## 2022-06-09 ENCOUNTER — Other Ambulatory Visit: Payer: Self-pay | Admitting: Nurse Practitioner

## 2022-06-09 DIAGNOSIS — R079 Chest pain, unspecified: Secondary | ICD-10-CM | POA: Diagnosis not present

## 2022-06-09 DIAGNOSIS — K5150 Left sided colitis without complications: Secondary | ICD-10-CM

## 2022-06-09 DIAGNOSIS — I10 Essential (primary) hypertension: Secondary | ICD-10-CM | POA: Diagnosis not present

## 2022-06-09 DIAGNOSIS — R457 State of emotional shock and stress, unspecified: Secondary | ICD-10-CM | POA: Diagnosis not present

## 2022-06-10 ENCOUNTER — Telehealth: Payer: Self-pay | Admitting: Nurse Practitioner

## 2022-06-10 NOTE — Telephone Encounter (Signed)
Contacted [name] to schedule their annual wellness visit. Patient declined to schedule AWV at this time.  Patient asked to be called in a few months.  Stated she has not been feeling well  Thank you,  Colletta Maryland,  [place] ??[phone]

## 2022-07-13 DIAGNOSIS — K080 Exfoliation of teeth due to systemic causes: Secondary | ICD-10-CM | POA: Diagnosis not present

## 2022-07-14 ENCOUNTER — Other Ambulatory Visit: Payer: Self-pay | Admitting: Nurse Practitioner

## 2022-07-14 DIAGNOSIS — K5150 Left sided colitis without complications: Secondary | ICD-10-CM

## 2022-07-30 ENCOUNTER — Ambulatory Visit: Payer: Medicare Other | Admitting: Nurse Practitioner

## 2022-08-10 DIAGNOSIS — R0789 Other chest pain: Secondary | ICD-10-CM | POA: Diagnosis not present

## 2022-08-10 DIAGNOSIS — R079 Chest pain, unspecified: Secondary | ICD-10-CM | POA: Diagnosis not present

## 2022-08-10 DIAGNOSIS — R064 Hyperventilation: Secondary | ICD-10-CM | POA: Diagnosis not present

## 2022-08-10 DIAGNOSIS — I10 Essential (primary) hypertension: Secondary | ICD-10-CM | POA: Diagnosis not present

## 2022-08-11 ENCOUNTER — Ambulatory Visit (INDEPENDENT_AMBULATORY_CARE_PROVIDER_SITE_OTHER): Payer: Medicare Other | Admitting: Nurse Practitioner

## 2022-08-11 ENCOUNTER — Encounter: Payer: Self-pay | Admitting: Nurse Practitioner

## 2022-08-11 VITALS — BP 158/86 | HR 90 | Temp 97.9°F | Resp 20 | Ht 69.0 in | Wt 163.0 lb

## 2022-08-11 DIAGNOSIS — K5150 Left sided colitis without complications: Secondary | ICD-10-CM

## 2022-08-11 DIAGNOSIS — Z7984 Long term (current) use of oral hypoglycemic drugs: Secondary | ICD-10-CM

## 2022-08-11 DIAGNOSIS — E1169 Type 2 diabetes mellitus with other specified complication: Secondary | ICD-10-CM | POA: Diagnosis not present

## 2022-08-11 DIAGNOSIS — F3342 Major depressive disorder, recurrent, in full remission: Secondary | ICD-10-CM

## 2022-08-11 DIAGNOSIS — E1122 Type 2 diabetes mellitus with diabetic chronic kidney disease: Secondary | ICD-10-CM | POA: Diagnosis not present

## 2022-08-11 DIAGNOSIS — I10 Essential (primary) hypertension: Secondary | ICD-10-CM | POA: Diagnosis not present

## 2022-08-11 DIAGNOSIS — N1832 Chronic kidney disease, stage 3b: Secondary | ICD-10-CM

## 2022-08-11 DIAGNOSIS — M797 Fibromyalgia: Secondary | ICD-10-CM

## 2022-08-11 DIAGNOSIS — E785 Hyperlipidemia, unspecified: Secondary | ICD-10-CM | POA: Diagnosis not present

## 2022-08-11 DIAGNOSIS — F5101 Primary insomnia: Secondary | ICD-10-CM

## 2022-08-11 LAB — BAYER DCA HB A1C WAIVED: HB A1C (BAYER DCA - WAIVED): 7.5 % — ABNORMAL HIGH (ref 4.8–5.6)

## 2022-08-11 MED ORDER — OXYCODONE HCL 5 MG PO TABS
5.0000 mg | ORAL_TABLET | Freq: Two times a day (BID) | ORAL | 0 refills | Status: DC
Start: 2022-10-13 — End: 2023-02-04

## 2022-08-11 MED ORDER — OXYCODONE HCL 5 MG PO TABS: 5.0000 mg | ORAL_TABLET | Freq: Two times a day (BID) | ORAL | 0 refills | Status: DC

## 2022-08-11 MED ORDER — OXYCODONE HCL 5 MG PO TABS
5.0000 mg | ORAL_TABLET | Freq: Two times a day (BID) | ORAL | 0 refills | Status: DC
Start: 2022-11-12 — End: 2023-02-04

## 2022-08-11 NOTE — Progress Notes (Signed)
Subjective:    Patient ID: [name], female    DOB: 1940-09-28, 82 y.o.   MRN: [id number]   Chief Complaint: medical management of chronic issues     HPI:  [name] is a 82 y.o. who identifies as a female who was assigned female at birth.   Social history: Lives with: by herself Work history: retired   Water engineer in today for follow up of the following chronic medical issues:  1. Essential hypertension No c/o chest pain, sob or headache. Does not check blood pressure at home. BP Readings from Last 3 Encounters:  04/30/22 (!) 140/86  10/09/21 (!) 160/78  07/09/21 138/83     2. Hyperlipidemia associated with type 2 diabetes mellitus (HCC) Does not wtatch diet and does no exercise at all. Lab Results  Component Value Date   CHOL 111 04/30/2022   HDL 40 04/30/2022   LDLCALC 41 04/30/2022   TRIG 182 (H) 04/30/2022   CHOLHDL 2.8 04/30/2022     3. Type 2 diabetes mellitus with stage 3b chronic kidney disease, without long-term current use of insulin (HCC) Patient does not check blood sugars very often. Patient did not want to change meds at last visit, wanted to try diet control. Lab Results  Component Value Date   HGBA1C 8.6 (H) 04/30/2022     4. ULCERATIVE COLITIS, LEFT SIDED No recent flare up  5. Stage 3b chronic kidney disease (HCC) Lab Results  Component Value Date   CREATININE 1.25 (H) 04/30/2022     6. Recurrent major depressive disorder, in full remission (HCC) Is on lexarpo and is doing well.    08/11/2022    2:23 PM 04/30/2022    3:20 PM 01/26/2022    1:48 PM  Depression screen PHQ 2/9  Decreased Interest 1 1 2   Down, Depressed, Hopeless 1 1 0  PHQ - 2 Score 2 2 2   Altered sleeping 1 3 1   Tired, decreased energy 1 2 2   Change in appetite 2 2 0  Feeling bad or failure about yourself  0 0 0  Trouble concentrating 0 0 0  Moving slowly or fidgety/restless 0 0 0  Suicidal thoughts 0 0 0  PHQ-9 Score 6 9 5   Difficult doing work/chores  Somewhat difficult Somewhat difficult Somewhat difficult     7. Primary insomnia Is on nothing to sleep due to taking pain medication. Sleeps well some nights but not every night.  8. Fibromyalgia Pain assessment: Cause of pain- fibromyalgia Pain location- varies from day to day Pain on scale of 1-10- 5-6/10 Frequency- daily What increases pain-to much activity What makes pain Better-rest Effects on ADL - none Any change in general medical condition-none  Current opioids rx- roxicodone 5mg  daily # meds rx- 60 Effectiveness of current meds-helps Adverse reactions from pain meds-none Morphine equivalent- 15 MME  Pill count performed-No Last drug screen - 04/10/21 ( high risk q13m, moderate risk q64m, low risk yearly ) Urine drug screen today- No Was the [facility] reviewed- yes  If yes were their any concerning findings? - no   Overdose risk: 1     No data to display           Pain contract signed on:10/13/21    New complaints: none  Allergies  Allergen Reactions   Azathioprine Nausea And Vomiting and Other (See Comments)    SEVERE NAUSEA AND VOMITING WITH CHEST PAIN-  PATIENT INSISTS NEVER TO BE GIVEN ANYTHING SIMILAR TO THIS   Tape Other (See  Comments) and Rash    BLISTERS AND SKIN TEARING   Codeine Nausea And Vomiting   Milk (Cow) Nausea And Vomiting   Penicillins Hives    Has patient had a PCN reaction causing immediate rash, facial/tongue/throat swelling, SOB or lightheadedness with hypotension: No Has patient had a PCN reaction causing severe rash involving mucus membranes or skin necrosis: No Has patient had a PCN reaction that required hospitalization: No Has patient had a PCN reaction occurring within the last 10 years: No If all of the above answers are "NO", then may proceed with Cephalosporin use.    Povidone-Iodine Hives    BETADINE   Procaine Hcl Other (See Comments)    SEVERE GI UPSET (NAUSEA,VOMITING AND DIARRHEA)   Sulfonamide Derivatives Hives    Tramadol Itching   Acyclovir And Related    Azithromycin     Mycins    Clonazepam Other (See Comments)    Skin burning, insomnia, diarrhea.   Dairy Aid [Tilactase]    Doxycycline Rash   Latex Rash   Niacin And Related Rash   Outpatient Encounter Medications as of 08/11/2022  Medication Sig   amLODipine (NORVASC) 2.5 MG tablet Take 1 tablet (2.5 mg total) by mouth daily.   antiseptic oral rinse (BIOTENE) LIQD 1 application by Mouth Rinse route daily. FOR DRY MOUTH   benazepril-hydrochlorthiazide (LOTENSIN HCT) 20-12.5 MG tablet Take 2 tablets by mouth daily. TAKE (2) TABLETS  BY MOUTH ONCE DAILY.   Blood Glucose Monitoring Suppl (ONETOUCH VERIO FLEX SYSTEM) w/Device KIT USE AS DIRECTED WITH TESTING SUPPLIES UP TO 4 TIMES A DAY   busPIRone (BUSPAR) 10 MG tablet Take 1 tablet (10 mg total) by mouth 3 (three) times daily.   co-enzyme Q-10 50 MG capsule Take 50 mg by mouth once a week.   dicyclomine (BENTYL) 20 MG tablet TAKE ONE-HALF TO 1 TABLET DAILY   escitalopram (LEXAPRO) 20 MG tablet Take 1 tablet (20 mg total) by mouth daily.   glucose blood (ONETOUCH VERIO) test strip Test BS up to 4 times daily or as directed Dx E11.22   hydrocortisone (ANUSOL-HC) 2.5 % rectal cream PLACE 1 APPLICATION RECTALLY 2 TIMES A DAY   JANUVIA 100 MG tablet Take 1 tablet (100 mg total) by mouth daily.   Lancets (ONETOUCH DELICA PLUS LANCET33G) MISC CHECK BLOOD SUGAR UP TO 4 TIMES A DAY OR AS DIRECTED   Loperamide HCl (IMODIUM PO) Take 1 tablet by mouth daily as needed (diarrhea).   methocarbamol (ROBAXIN) 500 MG tablet TAKE 1 TABLET EVERY 6 HOURS AS NEEDED FOR MUSCLE SPASMS   Multiple Vitamins-Minerals (WOMENS MULTI VITAMIN & MINERAL PO) Take 1 tablet by mouth every other day.   Omega-3 Fatty Acids (FISH OIL) 1000 MG CPDR Take 3 capsules by mouth daily.    ondansetron (ZOFRAN) 4 MG tablet TAKE ONE TABLET EVERY 8 HOURS AS NEEDED FOR NAUSEA AND VOMITING   [START ON 08/13/2022] oxyCODONE (OXY IR/ROXICODONE) 5  MG immediate release tablet Take 1 tablet (5 mg total) by mouth in the morning and at bedtime. TAKE (1) TABLET TWICE DAILY.   oxyCODONE (OXY IR/ROXICODONE) 5 MG immediate release tablet Take 1 tablet (5 mg total) by mouth in the morning and at bedtime. TAKE (1) TABLET TWICE DAILY.   oxyCODONE (OXY IR/ROXICODONE) 5 MG immediate release tablet Take 1 tablet (5 mg total) by mouth in the morning and at bedtime. TAKE (1) TABLET TWICE DAILY.   polyethylene glycol (MIRALAX / GLYCOLAX) 17 g packet Take 1 packet daily with  8 ounces of water as needed for constipation.   Probiotic Product (PROBIOTIC FORMULA PO) Take 1-2 capsules by mouth every morning.   rosuvastatin (CRESTOR) 10 MG tablet Take 1 tablet (10 mg total) by mouth daily.   shark liver oil-cocoa butter (PREPARATION H) 0.25-3-85.5 % suppository Place 1 suppository rectally as needed for hemorrhoids.   traZODone (DESYREL) 50 MG tablet TAKE 1/2 TO 1 TABLET (25- 50MG  TOTAL) BY MOUTH AT BEDTIME AS NEEDED FOR SLEEP.   vitamin E 400 UNIT capsule Take 400 Units by mouth every morning.   [DISCONTINUED] sodium chloride (OCEAN) 0.65 % SOLN nasal spray Place 2 sprays into the nose as needed for congestion.   No facility-administered encounter medications on file as of 08/11/2022.    Past Surgical History:  Procedure Laterality Date   ABDOMINAL HYSTERECTOMY     BIOPSY N/A 12/28/2014   Procedure: SIGMOID AND RECTAL BIOPSIES;  Surgeon: Malissa Hippo, MD;  Location: [facility];  Service: Endoscopy;  Laterality: N/A;   CHOLECYSTECTOMY     s/p   COLONOSCOPY  06/16/06. 04/27/2007   proctitis seen, biopsies showed chronic active colitis, no dysplasia    EYE SURGERY     clear lense extraction 1998    FLEXIBLE SIGMOIDOSCOPY N/A 12/28/2014   Procedure: FLEXIBLE SIGMOIDOSCOPY WITH PROPOFOL;  Surgeon: Malissa Hippo, MD;  Location: [facility];  Service: Endoscopy;  Laterality: N/A;    Family History  Problem Relation Age of Onset   Heart attack Mother 73       Died  in her sleep   Diabetes Mother    Heart attack Father 31       Died in her sleep   Cancer Sister        liver, breast    Heart disease Sister    Breast cancer Maternal Aunt    Breast cancer Maternal Aunt    Heart attack Brother 49       Defib, CABG   Diabetes Brother    Kidney disease Brother        dyalasis       Review of Systems  Constitutional:  Negative for diaphoresis.  Eyes:  Negative for pain.  Respiratory:  Negative for shortness of breath.   Cardiovascular:  Negative for chest pain, palpitations and leg swelling.  Gastrointestinal:  Negative for abdominal pain.  Endocrine: Negative for polydipsia.  Skin:  Negative for rash.  Neurological:  Negative for dizziness, weakness and headaches.  Hematological:  Does not bruise/bleed easily.  All other systems reviewed and are negative.      Objective:   Physical Exam Vitals and nursing note reviewed.  Constitutional:      General: She is not in acute distress.    Appearance: Normal appearance. She is well-developed.  HENT:     Head: Normocephalic.     Right Ear: Tympanic membrane normal.     Left Ear: Tympanic membrane normal.     Nose: Nose normal.     Mouth/Throat:     Mouth: Mucous membranes are moist.  Eyes:     Pupils: Pupils are equal, round, and reactive to light.  Neck:     Vascular: No carotid bruit or JVD.  Cardiovascular:     Rate and Rhythm: Normal rate and regular rhythm.     Heart sounds: Normal heart sounds.  Pulmonary:     Effort: Pulmonary effort is normal. No respiratory distress.     Breath sounds: Normal breath sounds. No wheezing or rales.  Chest:  Chest wall: No tenderness.  Abdominal:     General: Bowel sounds are normal. There is no distension or abdominal bruit.     Palpations: Abdomen is soft. There is no hepatomegaly, splenomegaly, mass or pulsatile mass.     Tenderness: There is no abdominal tenderness.  Musculoskeletal:        General: Normal range of motion.      Cervical back: Normal range of motion and neck supple.  Lymphadenopathy:     Cervical: No cervical adenopathy.  Skin:    General: Skin is warm and dry.  Neurological:     Mental Status: She is alert and oriented to person, place, and time.     Deep Tendon Reflexes: Reflexes are normal and symmetric.  Psychiatric:        Behavior: Behavior normal.        Thought Content: Thought content normal.        Judgment: Judgment normal.     BP (!) 158/86   Pulse 90   Temp 97.9 F (36.6 C) (Temporal)   Resp 20   Ht 5\' 9"  (1.753 m)   Wt 163 lb (73.9 kg)   SpO2 96%   BMI 24.07 kg/m    Hba1c 7.5     Assessment & Plan:   [name] in today with chief complaint of Medical Management of Chronic Issues   1. Essential hypertension Low sodium diet - CBC with Differential/Platelet - CMP14+EGFR  2. Hyperlipidemia associated with type 2 diabetes mellitus (HCC) Low fat diet - Lipid panel  3. Type 2 diabetes mellitus with stage 3b chronic kidney disease, without long-term current use of insulin (HCC) Continueto watch crbs in diet - Bayer DCA Hb A1c Waived - Microalbumin / creatinine urine ratio  4. ULCERATIVE COLITIS, LEFT SIDED Watch diet to help prevent flare up  5. Stage 3b chronic kidney disease (HCC) Labs pending  6. Recurrent major depressive disorder, in full remission [facility]) Stress management  7. Primary insomnia Bedtime routine  8. Fibromyalgia - oxyCODONE (OXY IR/ROXICODONE) 5 MG immediate release tablet; Take 1 tablet (5 mg total) by mouth in the morning and at bedtime. TAKE (1) TABLET TWICE DAILY.  Dispense: 60 tablet; Refill: 0 - oxyCODONE (OXY IR/ROXICODONE) 5 MG immediate release tablet; Take 1 tablet (5 mg total) by mouth in the morning and at bedtime. TAKE (1) TABLET TWICE DAILY.  Dispense: 60 tablet; Refill: 0 - oxyCODONE (OXY IR/ROXICODONE) 5 MG immediate release tablet; Take 1 tablet (5 mg total) by mouth in the morning and at bedtime. TAKE (1) TABLET  TWICE DAILY.  Dispense: 60 tablet; Refill: 0    The above assessment and management plan was discussed with the patient. The patient verbalized understanding of and has agreed to the management plan. Patient is aware to call the clinic if symptoms persist or worsen. Patient is aware when to return to the clinic for a follow-up visit. Patient educated on when it is appropriate to go to the emergency department.   Mary-Margaret Daphine Deutscher, FNP

## 2022-08-11 NOTE — Patient Instructions (Signed)
Myofascial Pain Syndrome and Fibromyalgia Myofascial pain syndrome and fibromyalgia are both pain disorders. You may feel this pain mainly in your muscles. Myofascial pain syndrome: Always has tender points in the muscles that will cause pain when pressed (trigger points). The pain may come and go. Usually affects your neck, upper back, and shoulder areas. The pain often moves into your arms and hands. Fibromyalgia: Has muscle pains and tenderness that come and go. Is often associated with tiredness (fatigue) and sleep problems. Has trigger points. Tends to be long-lasting (chronic), but is not life-threatening. Fibromyalgia and myofascial pain syndrome are not the same. However, they often occur together. If you have both conditions, each can make the other worse. Both are common and can cause enough pain and fatigue to make day-to-day activities difficult. Both can be hard to diagnose because their symptoms are common in many other conditions. What are the causes? The exact causes of these conditions are not known. What increases the risk? You are more likely to develop either of these conditions if: You have a family history of the condition. You are female. You have certain triggers, such as: Spine disorders. An injury (trauma) or other physical stressors. Being under a lot of stress. Medical conditions such as osteoarthritis, rheumatoid arthritis, or lupus. What are the signs or symptoms? Fibromyalgia The main symptom of fibromyalgia is widespread pain and tenderness in your muscles. Pain is sometimes described as stabbing, shooting, or burning. You may also have: Tingling or numbness. Sleep problems and fatigue. Problems with attention and concentration (fibro fog). Other symptoms may include: Bowel and bladder problems. Headaches. Vision problems. Sensitivity to odors and noises. Depression or mood changes. Painful menstrual periods (dysmenorrhea). Dry skin or eyes. These  symptoms can vary over time. Myofascial pain syndrome Symptoms of myofascial pain syndrome include: Tight, ropy bands of muscle. Uncomfortable sensations in muscle areas. These may include aching, cramping, burning, numbness, tingling, and weakness. Difficulty moving certain parts of the body freely (poor range of motion). How is this diagnosed? This condition may be diagnosed by your symptoms and medical history. You will also have a physical exam. In general: Fibromyalgia is diagnosed if you have pain, fatigue, and other symptoms for more than 3 months, and symptoms cannot be explained by another condition. Myofascial pain syndrome is diagnosed if you have trigger points in your muscles, and those trigger points are tender and cause pain elsewhere in your body (referred pain). How is this treated? Treatment for these conditions depends on the type that you have. For fibromyalgia, a healthy lifestyle is the most important treatment including aerobic and strength exercises. Different types of medicines are used to help treat pain and include: NSAIDs. Medicines for treating depression. Medicines that help control seizures. Medicines that relax the muscles. Treatment for myofascial pain syndrome includes: Pain medicines, such as NSAIDs. Cooling and stretching of muscles. Massage therapy with myofascial release technique. Trigger point injections. Treating these conditions often requires a team of health care providers. These may include: Your primary care provider. A physical therapist. Complementary health care providers, such as massage therapists or acupuncturists. A psychiatrist for cognitive behavioral therapy. Follow these instructions at home: Medicines Take over-the-counter and prescription medicines only as told by your health care provider. Ask your health care provider if the medicine prescribed to you: Requires you to avoid driving or using machinery. Can cause constipation.  You may need to take these actions to prevent or treat constipation: Drink enough fluid to keep your urine pale   yellow. Take over-the-counter or prescription medicines. Eat foods that are high in fiber, such as beans, whole grains, and fresh fruits and vegetables. Limit foods that are high in fat and processed sugars, such as fried or sweet foods. Lifestyle  Do exercises as told by your health care provider or physical therapist. Practice relaxation techniques to control your stress. You may want to try: Biofeedback. Visual imagery. Hypnosis. Muscle relaxation. Yoga. Meditation. Maintain a healthy lifestyle. This includes eating a healthy diet and getting enough sleep. Do not use any products that contain nicotine or tobacco. These products include cigarettes, chewing tobacco, and vaping devices, such as e-cigarettes. If you need help quitting, ask your health care provider. General instructions Talk to your health care provider about complementary treatments, such as acupuncture or massage. Do not do activities that stress or strain your muscles. This includes repetitive motions and heavy lifting. Keep all follow-up visits. This is important. Where to find support Consider joining a support group with others who are diagnosed with this condition. National Fibromyalgia Association: fmaware.org Where to find more information [place] Pain Foundation: uspainfoundation.org Contact a health care provider if: You have new symptoms. Your symptoms get worse or your pain is severe. You have side effects from your medicines. You have trouble sleeping. Your condition is causing depression or anxiety. Get help right away if: You have thoughts of hurting yourself or others. Get help right away if you feel like you may hurt yourself or others, or have thoughts about taking your own life. Go to your nearest emergency room or: Call 911. Call the National Suicide Prevention Lifeline at 1-800-273-8255  or 988. This is open 24 hours a day. Text the Crisis Text Line at 741741. This information is not intended to replace advice given to you by your health care provider. Make sure you discuss any questions you have with your health care provider. Document Revised: 12/15/2021 Document Reviewed: 02/07/2021 Elsevier Patient Education  2023 [employer]  

## 2022-08-12 ENCOUNTER — Other Ambulatory Visit: Payer: Self-pay | Admitting: Nurse Practitioner

## 2022-08-12 DIAGNOSIS — I10 Essential (primary) hypertension: Secondary | ICD-10-CM

## 2022-08-12 LAB — CMP14+EGFR
ALT: 14 IU/L (ref 0–32)
AST: 23 IU/L (ref 0–40)
Albumin/Globulin Ratio: 1.7 (ref 1.2–2.2)
Albumin: 4.3 g/dL (ref 3.8–4.8)
Alkaline Phosphatase: 57 IU/L (ref 44–121)
BUN/Creatinine Ratio: 19 (ref 12–28)
BUN: 21 mg/dL (ref 8–27)
Bilirubin Total: 0.6 mg/dL (ref 0.0–1.2)
CO2: 22 mmol/L (ref 20–29)
Calcium: 9.7 mg/dL (ref 8.7–10.3)
Chloride: 84 mmol/L — ABNORMAL LOW (ref 96–106)
Creatinine, Ser: 1.11 mg/dL — ABNORMAL HIGH (ref 0.57–1.00)
Globulin, Total: 2.5 g/dL (ref 1.5–4.5)
Glucose: 185 mg/dL — ABNORMAL HIGH (ref 70–99)
Potassium: 3.8 mmol/L (ref 3.5–5.2)
Sodium: 123 mmol/L — ABNORMAL LOW (ref 134–144)
Total Protein: 6.8 g/dL (ref 6.0–8.5)
eGFR: 50 mL/min/{1.73_m2} — ABNORMAL LOW (ref >59)

## 2022-08-12 LAB — CBC WITH DIFFERENTIAL/PLATELET
Basophils Absolute: 0 10*3/uL (ref 0.0–0.2)
Basos: 0 %
EOS (ABSOLUTE): 0 10*3/uL (ref 0.0–0.4)
Eos: 0 %
Hematocrit: 37.6 % (ref 34.0–46.6)
Hemoglobin: 12.7 g/dL (ref 11.1–15.9)
Immature Grans (Abs): 0 10*3/uL (ref 0.0–0.1)
Immature Granulocytes: 0 %
Lymphocytes Absolute: 1.3 10*3/uL (ref 0.7–3.1)
Lymphs: 15 %
MCH: 30.5 pg (ref 26.6–33.0)
MCHC: 33.8 g/dL (ref 31.5–35.7)
MCV: 90 fL (ref 79–97)
Monocytes Absolute: 0.5 10*3/uL (ref 0.1–0.9)
Monocytes: 5 %
Neutrophils Absolute: 6.9 10*3/uL (ref 1.4–7.0)
Neutrophils: 80 %
Platelets: 310 10*3/uL (ref 150–450)
RBC: 4.17 x10E6/uL (ref 3.77–5.28)
RDW: 12.7 % (ref 11.7–15.4)
WBC: 8.7 10*3/uL (ref 3.4–10.8)

## 2022-08-12 LAB — LIPID PANEL
Chol/HDL Ratio: 3.3 ratio (ref 0.0–4.4)
Cholesterol, Total: 137 mg/dL (ref 100–199)
HDL: 42 mg/dL (ref >39)
LDL Chol Calc (NIH): 68 mg/dL (ref 0–99)
Triglycerides: 156 mg/dL — ABNORMAL HIGH (ref 0–149)
VLDL Cholesterol Cal: 27 mg/dL (ref 5–40)

## 2022-08-12 LAB — MICROALBUMIN / CREATININE URINE RATIO
Creatinine, Urine: 110.3 mg/dL
Microalb/Creat Ratio: 38 mg/g creat — ABNORMAL HIGH (ref 0–29)
Microalbumin, Urine: 42.4 ug/mL

## 2022-08-13 ENCOUNTER — Other Ambulatory Visit: Payer: Self-pay

## 2022-08-13 DIAGNOSIS — E1122 Type 2 diabetes mellitus with diabetic chronic kidney disease: Secondary | ICD-10-CM

## 2022-08-13 MED ORDER — JANUVIA 100 MG PO TABS
100.0000 mg | ORAL_TABLET | Freq: Every day | ORAL | 1 refills | Status: DC
Start: 2022-08-13 — End: 2023-02-04

## 2022-08-18 ENCOUNTER — Other Ambulatory Visit: Payer: Self-pay | Admitting: Nurse Practitioner

## 2022-08-18 DIAGNOSIS — K5150 Left sided colitis without complications: Secondary | ICD-10-CM

## 2022-08-18 DIAGNOSIS — I10 Essential (primary) hypertension: Secondary | ICD-10-CM

## 2022-08-23 DIAGNOSIS — R072 Precordial pain: Secondary | ICD-10-CM | POA: Insufficient documentation

## 2022-08-23 NOTE — Progress Notes (Deleted)
Cardiology Office Note   Date:  08/23/2022   ID:  [name], DOB 12/30/1940, MRN [id number]  PCP:  Bennie Pierini, FNP  Cardiologist:   None Referring:  ***  No chief complaint on file.     History of Present Illness: [name] is a 82 y.o. female who presents for    who presents for follow up of chest pain and a murmur.  She saw Dr. Wyline Mood for this.  The pain was felt to be atypical.  She saw him in 2019.  She did have an negative Lexiscan Myoview in 2007.  I last saw her in 2021.  ***  *** She was to have an echo after her last appt but I don't see that this was done.   She said that for some reason that she has been get scheduled.  She is back for routine follow-up.  She is having some right shoulder pain and does remember doing anything to it but it hurts to move her shoulder.  She is seeing a [facility].  It is a sharp discomfort.  She is not having any chest pressure, neck or left arm discomfort.  She is not having any new shortness of breath, PND orthopnea.  She has had no palpitations, presyncope or syncope.  Of note she was doing yard work about a month ago before her shoulder started bothering her and she was not having any problems with this.     Past Medical History:  Diagnosis Date   Anemia due to blood loss 05/02/2011   Anxiety    Arthritis    rheumatoid    Collagen vascular disease (HCC)    ra   Diabetes mellitus without complication (HCC)    Fibromyalgia    Hyperglycemia, drug-induced 05/02/2011   Hypertension    Ulcerative colitis     Past Surgical History:  Procedure Laterality Date   ABDOMINAL HYSTERECTOMY     BIOPSY N/A 12/28/2014   Procedure: SIGMOID AND RECTAL BIOPSIES;  Surgeon: Malissa Hippo, MD;  Location: [facility];  Service: Endoscopy;  Laterality: N/A;   CHOLECYSTECTOMY     s/p   COLONOSCOPY  06/16/06. 04/27/2007   proctitis seen, biopsies showed chronic active colitis, no dysplasia    EYE SURGERY     clear lense extraction 1998     FLEXIBLE SIGMOIDOSCOPY N/A 12/28/2014   Procedure: FLEXIBLE SIGMOIDOSCOPY WITH PROPOFOL;  Surgeon: Malissa Hippo, MD;  Location: [facility];  Service: Endoscopy;  Laterality: N/A;     Current Outpatient Medications  Medication Sig Dispense Refill   amLODipine (NORVASC) 2.5 MG tablet TAKE 1 TABLET ONCE DAILY. 30 tablet 5   antiseptic oral rinse (BIOTENE) LIQD 1 application by Mouth Rinse route daily. FOR DRY MOUTH     benazepril-hydrochlorthiazide (LOTENSIN HCT) 20-12.5 MG tablet TAKE 2 TABLETS BY MOUTH DAILY. 60 tablet 5   Blood Glucose Monitoring Suppl (ONETOUCH VERIO FLEX SYSTEM) w/Device KIT USE AS DIRECTED WITH TESTING SUPPLIES UP TO 4 TIMES A DAY 1 kit 0   busPIRone (BUSPAR) 10 MG tablet Take 1 tablet (10 mg total) by mouth 3 (three) times daily. 60 tablet 2   co-enzyme Q-10 50 MG capsule Take 50 mg by mouth once a week.     dicyclomine (BENTYL) 20 MG tablet TAKE ONE-HALF TO 1 TABLET DAILY 30 tablet 0   escitalopram (LEXAPRO) 20 MG tablet Take 1 tablet (20 mg total) by mouth daily. 30 tablet 2   glucose blood (ONETOUCH VERIO) test strip Test  BS up to 4 times daily or as directed Dx E11.22 400 strip 3   hydrocortisone (ANUSOL-HC) 2.5 % rectal cream PLACE 1 APPLICATION RECTALLY 2 TIMES A DAY 30 g 5   JANUVIA 100 MG tablet Take 1 tablet (100 mg total) by mouth daily. 90 tablet 1   Lancets (ONETOUCH DELICA PLUS LANCET33G) MISC CHECK BLOOD SUGAR UP TO 4 TIMES A DAY OR AS DIRECTED 200 each 11   Loperamide HCl (IMODIUM PO) Take 1 tablet by mouth daily as needed (diarrhea).     methocarbamol (ROBAXIN) 500 MG tablet TAKE 1 TABLET EVERY 6 HOURS AS NEEDED FOR MUSCLE SPASMS 30 tablet 0   Multiple Vitamins-Minerals (WOMENS MULTI VITAMIN & MINERAL PO) Take 1 tablet by mouth every other day.     Omega-3 Fatty Acids (FISH OIL) 1000 MG CPDR Take 3 capsules by mouth daily.      ondansetron (ZOFRAN) 4 MG tablet TAKE ONE TABLET EVERY 8 HOURS AS NEEDED FOR NAUSEA AND VOMITING 20 tablet 0   [START ON  09/13/2022] oxyCODONE (OXY IR/ROXICODONE) 5 MG immediate release tablet Take 1 tablet (5 mg total) by mouth in the morning and at bedtime. TAKE (1) TABLET TWICE DAILY. 60 tablet 0   [START ON 10/13/2022] oxyCODONE (OXY IR/ROXICODONE) 5 MG immediate release tablet Take 1 tablet (5 mg total) by mouth in the morning and at bedtime. TAKE (1) TABLET TWICE DAILY. 60 tablet 0   [START ON 11/12/2022] oxyCODONE (OXY IR/ROXICODONE) 5 MG immediate release tablet Take 1 tablet (5 mg total) by mouth in the morning and at bedtime. TAKE (1) TABLET TWICE DAILY. 60 tablet 0   polyethylene glycol (MIRALAX / GLYCOLAX) 17 g packet Take 1 packet daily with 8 ounces of water as needed for constipation. 14 each 0   Probiotic Product (PROBIOTIC FORMULA PO) Take 1-2 capsules by mouth every morning.     rosuvastatin (CRESTOR) 10 MG tablet TAKE 1 TABLET ONCE DAILY. 30 tablet 5   shark liver oil-cocoa butter (PREPARATION H) 0.25-3-85.5 % suppository Place 1 suppository rectally as needed for hemorrhoids.     traZODone (DESYREL) 50 MG tablet TAKE 1/2 TO 1 TABLET (25- 50MG  TOTAL) BY MOUTH AT BEDTIME AS NEEDED FOR SLEEP. 90 tablet 1   vitamin E 400 UNIT capsule Take 400 Units by mouth every morning.     No current facility-administered medications for this visit.    Allergies:   Azathioprine, Tape, Codeine, Milk (cow), Penicillins, Povidone-iodine, Procaine hcl, Sulfonamide derivatives, Tramadol, Acyclovir and related, Azithromycin, Clonazepam, Dairy aid [tilactase], Doxycycline, Latex, and Niacin and related    Social History:  The patient  reports that she has never smoked. She has never used smokeless tobacco. She reports that she does not drink alcohol and does not use drugs.   Family History:  The patient's ***family history includes Breast cancer in her maternal aunt and maternal aunt; Cancer in her sister; Diabetes in her brother and mother; Heart attack (age of onset: 56) in her brother; Heart attack (age of onset: 26) in  her father and mother; Heart disease in her sister; Kidney disease in her brother.    ROS:  Please see the history of present illness.   Otherwise, review of systems are positive for {NONE DEFAULTED:18576}.   All other systems are reviewed and negative.    PHYSICAL EXAM: VS:  There were no vitals taken for this visit. , BMI There is no height or weight on file to calculate BMI. GENERAL:  Well appearing HEENT:  Pupils equal round and reactive, fundi not visualized, oral mucosa unremarkable NECK:  No jugular venous distention, waveform within normal limits, carotid upstroke brisk and symmetric, no bruits, no thyromegaly LYMPHATICS:  No cervical, inguinal adenopathy LUNGS:  Clear to auscultation bilaterally BACK:  No CVA tenderness CHEST:  Unremarkable HEART:  PMI not displaced or sustained,S1 and S2 within normal limits, no S3, no S4, no clicks, no rubs, *** murmurs ABD:  Flat, positive bowel sounds normal in frequency in pitch, no bruits, no rebound, no guarding, no midline pulsatile mass, no hepatomegaly, no splenomegaly EXT:  2 plus pulses throughout, no edema, no cyanosis no clubbing SKIN:  No rashes no nodules NEURO:  Cranial nerves II through XII grossly intact, motor grossly intact throughout PSYCH:  Cognitively intact, oriented to person place and time    EKG:  EKG {ACTION; IS/IS ZOX:[id number]} ordered today. The ekg ordered today demonstrates ***   Recent Labs: 08/11/2022: ALT 14; BUN 21; Creatinine, Ser 1.11; Hemoglobin 12.7; Platelets 310; Potassium 3.8; Sodium 123    Lipid Panel    Component Value Date/Time   CHOL 137 08/11/2022 1427   CHOL 200 (H) 08/01/2012 1100   TRIG 156 (H) 08/11/2022 1427   TRIG 294 (H) 06/08/2014 1639   TRIG 171 (H) 08/01/2012 1100   HDL 42 08/11/2022 1427   HDL 44 06/08/2014 1639   HDL 55 08/01/2012 1100   CHOLHDL 3.3 08/11/2022 1427   CHOLHDL 6.1 03/04/2007 0420   VLDL 26 03/04/2007 0420   LDLCALC 68 08/11/2022 1427   LDLCALC 81  12/28/2013 1106   LDLCALC 111 (H) 08/01/2012 1100      Wt Readings from Last 3 Encounters:  08/11/22 163 lb (73.9 kg)  04/30/22 166 lb (75.3 kg)  10/09/21 158 lb (71.7 kg)      Other studies Reviewed: Additional studies/ records that were reviewed today include: ***. Review of the above records demonstrates:  Please see elsewhere in the note.  ***   ASSESSMENT AND PLAN:  CHEST PAIN:  ***   She does not have chest pain that she described previously.  No further work-up.   MURMUR: I do not appreciate a heart murmur.  ***    I would not suggest further testing is indicated.   HTN: Her blood pressure is ***  not well controlled.  Of asked her to get up blood pressure diary.  I am going to increase benazepril HCT to 40/25. Marland Kitchen  She can double up on the pills she has until she has run out of these.   Current medicines are reviewed at length with the patient today.  The patient {ACTIONS; HAS/DOES NOT HAVE:19233} concerns regarding medicines.  The following changes have been made:  {PLAN; NO CHANGE:13088:s}  Labs/ tests ordered today include: *** No orders of the defined types were placed in this encounter.    Disposition:   FU with ***    Signed, Rollene Rotunda, MD  08/23/2022 9:21 PM    [facility]

## 2022-08-26 ENCOUNTER — Ambulatory Visit: Payer: Medicare Other | Admitting: Cardiology

## 2022-08-26 DIAGNOSIS — I10 Essential (primary) hypertension: Secondary | ICD-10-CM

## 2022-08-26 DIAGNOSIS — R072 Precordial pain: Secondary | ICD-10-CM

## 2022-08-26 DIAGNOSIS — R011 Cardiac murmur, unspecified: Secondary | ICD-10-CM

## 2022-08-31 ENCOUNTER — Other Ambulatory Visit: Payer: Self-pay | Admitting: Nurse Practitioner

## 2022-08-31 DIAGNOSIS — Z1231 Encounter for screening mammogram for malignant neoplasm of breast: Secondary | ICD-10-CM

## 2022-09-10 ENCOUNTER — Other Ambulatory Visit: Payer: Self-pay

## 2022-09-10 DIAGNOSIS — K5150 Left sided colitis without complications: Secondary | ICD-10-CM

## 2022-09-10 DIAGNOSIS — I10 Essential (primary) hypertension: Secondary | ICD-10-CM

## 2022-09-10 MED ORDER — BENAZEPRIL-HYDROCHLOROTHIAZIDE 20-12.5 MG PO TABS
2.0000 | ORAL_TABLET | Freq: Every day | ORAL | 1 refills | Status: DC
Start: 2022-09-10 — End: 2023-01-14

## 2022-09-10 MED ORDER — ROSUVASTATIN CALCIUM 10 MG PO TABS
10.0000 mg | ORAL_TABLET | Freq: Every day | ORAL | 1 refills | Status: DC
Start: 2022-09-10 — End: 2023-01-04

## 2022-09-14 ENCOUNTER — Encounter: Payer: Self-pay | Admitting: *Deleted

## 2022-09-28 ENCOUNTER — Ambulatory Visit: Payer: Medicare Other

## 2022-10-02 ENCOUNTER — Ambulatory Visit: Payer: Medicare Other | Admitting: Internal Medicine

## 2022-10-14 ENCOUNTER — Inpatient Hospital Stay: Admission: RE | Admit: 2022-10-14 | Payer: Medicare Other | Source: Ambulatory Visit

## 2022-10-20 ENCOUNTER — Ambulatory Visit: Payer: Medicare Other | Admitting: Internal Medicine

## 2022-10-29 ENCOUNTER — Ambulatory Visit: Payer: Medicare Other | Admitting: Nurse Practitioner

## 2022-11-05 ENCOUNTER — Ambulatory Visit: Payer: Medicare Other | Admitting: Nurse Practitioner

## 2022-11-12 ENCOUNTER — Other Ambulatory Visit: Payer: Self-pay | Admitting: Nurse Practitioner

## 2022-11-20 ENCOUNTER — Other Ambulatory Visit: Payer: Self-pay | Admitting: Nurse Practitioner

## 2022-11-20 DIAGNOSIS — F3342 Major depressive disorder, recurrent, in full remission: Secondary | ICD-10-CM

## 2022-11-24 ENCOUNTER — Ambulatory Visit: Payer: Medicare Other | Admitting: Nurse Practitioner

## 2022-11-24 NOTE — Progress Notes (Deleted)
Subjective:    Patient ID: [name], female    DOB: 04/04/1940, 82 y.o.   MRN: [id number]   Chief Complaint: medical management of chronic issues     HPI:  [name] is a 82 y.o. who identifies as a female who was assigned female at birth.   Social history: Lives with: by herself- family and friends check on her daily Work history: retired   Water engineer in today for follow up of the following chronic medical issues:  1. Essential hypertension No c/o chest pain, sob or headache. Does not check blood pressure at home. BP Readings from Last 3 Encounters:  08/11/22 (!) 158/86  04/30/22 (!) 140/86  10/09/21 (!) 160/78     2. Hyperlipidemia associated with type 2 diabetes mellitus (HCC) Does not really watch diet and does no dedicated exercise. Lab Results  Component Value Date   CHOL 137 08/11/2022   HDL 42 08/11/2022   LDLCALC 68 08/11/2022   TRIG 156 (H) 08/11/2022   CHOLHDL 3.3 08/11/2022     3. Diet-controlled diabetes mellitus (HCC) Does not check blood sugars at home very often Lab Results  Component Value Date   HGBA1C 7.5 (H) 08/11/2022     4. Stage 3b chronic kidney disease (HCC) No voiding issues. Denies edema Lab Results  Component Value Date   CREATININE 1.11 (H) 08/11/2022     5. Bradycardia No syncopal or near syncopal episodes  6. ULCERATIVE COLITIS, LEFT SIDED Chronic pain  7. Fibromyalgia Chronic pain Pain assessment: Cause of pain- ulcerative colitis and fibromyalgia Pain location- abdomen all the time- other mucle areas that vary from day to day Pain on scale of 1-10- 7-8/10 Frequency- daily What increases pain-nothing What makes pain Better-pain meds help Effects on ADL - none Any change in general medical condition-none  Current opioids rx- oxycodone 5mg  # meds rx- 60 Effectiveness of current meds-helps Adverse reactions from pain meds-none Morphine equivalent- 15 MME  Pill count performed-No Last drug screen -  04/10/21 ( high risk q68m, moderate risk q40m, low risk yearly ) Urine drug screen today- Yes Was the [facility] reviewed- yes  If yes were their any concerning findings? - no   Overdose risk: 1   Pain contract signed on:11/24/22   8. Primary insomnia Is on trazadone to sleep and that helps some- sleeps about 6 hours but wakes up frequently  9. Recurrent major depressive disorder, in full remission (HCC) Is on combination of buspar and and lexapro. Is doing well ***   New complaints: ***  Allergies  Allergen Reactions   Azathioprine Nausea And Vomiting and Other (See Comments)    SEVERE NAUSEA AND VOMITING WITH CHEST PAIN-  PATIENT INSISTS NEVER TO BE GIVEN ANYTHING SIMILAR TO THIS   Tape Other (See Comments) and Rash    BLISTERS AND SKIN TEARING   Codeine Nausea And Vomiting   Milk (Cow) Nausea And Vomiting   Penicillins Hives    Has patient had a PCN reaction causing immediate rash, facial/tongue/throat swelling, SOB or lightheadedness with hypotension: No Has patient had a PCN reaction causing severe rash involving mucus membranes or skin necrosis: No Has patient had a PCN reaction that required hospitalization: No Has patient had a PCN reaction occurring within the last 10 years: No If all of the above answers are "NO", then may proceed with Cephalosporin use.    Povidone-Iodine Hives    BETADINE   Procaine Hcl Other (See Comments)    SEVERE GI UPSET (NAUSEA,VOMITING AND  DIARRHEA)   Sulfonamide Derivatives Hives   Tramadol Itching   Acyclovir And Related    Azithromycin     Mycins    Clonazepam Other (See Comments)    Skin burning, insomnia, diarrhea.   Dairy Aid [Tilactase]    Doxycycline Rash   Latex Rash   Niacin And Related Rash   Outpatient Encounter Medications as of 11/24/2022  Medication Sig   amLODipine (NORVASC) 2.5 MG tablet TAKE 1 TABLET ONCE DAILY.   antiseptic oral rinse (BIOTENE) LIQD 1 application by Mouth Rinse route daily. FOR DRY MOUTH    benazepril-hydrochlorthiazide (LOTENSIN HCT) 20-12.5 MG tablet Take 2 tablets by mouth daily.   Blood Glucose Monitoring Suppl (ONETOUCH VERIO FLEX SYSTEM) w/Device KIT USE AS DIRECTED WITH TESTING SUPPLIES UP TO 4 TIMES A DAY   busPIRone (BUSPAR) 10 MG tablet TAKE 1 TABLET BY MOUTH 3 TIMES DAILY.   co-enzyme Q-10 50 MG capsule Take 50 mg by mouth once a week.   dicyclomine (BENTYL) 20 MG tablet TAKE ONE-HALF TO 1 TABLET DAILY   escitalopram (LEXAPRO) 20 MG tablet Take 1 tablet (20 mg total) by mouth daily.   glucose blood (ONETOUCH VERIO) test strip Test BS up to 4 times daily or as directed Dx E11.22   hydrocortisone (ANUSOL-HC) 2.5 % rectal cream PLACE 1 APPLICATION RECTALLY 2 TIMES A DAY   JANUVIA 100 MG tablet Take 1 tablet (100 mg total) by mouth daily.   Lancets (ONETOUCH DELICA PLUS LANCET33G) MISC CHECK BLOOD SUGAR UP TO 4 TIMES A DAY OR AS DIRECTED   Loperamide HCl (IMODIUM PO) Take 1 tablet by mouth daily as needed (diarrhea).   methocarbamol (ROBAXIN) 500 MG tablet TAKE 1 TABLET EVERY 6 HOURS AS NEEDED FOR MUSCLE SPASMS   Multiple Vitamins-Minerals (WOMENS MULTI VITAMIN & MINERAL PO) Take 1 tablet by mouth every other day.   Omega-3 Fatty Acids (FISH OIL) 1000 MG CPDR Take 3 capsules by mouth daily.    ondansetron (ZOFRAN) 4 MG tablet TAKE ONE TABLET EVERY 8 HOURS AS NEEDED FOR NAUSEA AND VOMITING   oxyCODONE (OXY IR/ROXICODONE) 5 MG immediate release tablet Take 1 tablet (5 mg total) by mouth in the morning and at bedtime. TAKE (1) TABLET TWICE DAILY.   oxyCODONE (OXY IR/ROXICODONE) 5 MG immediate release tablet Take 1 tablet (5 mg total) by mouth in the morning and at bedtime. TAKE (1) TABLET TWICE DAILY.   oxyCODONE (OXY IR/ROXICODONE) 5 MG immediate release tablet Take 1 tablet (5 mg total) by mouth in the morning and at bedtime. TAKE (1) TABLET TWICE DAILY.   polyethylene glycol (MIRALAX / GLYCOLAX) 17 g packet Take 1 packet daily with 8 ounces of water as needed for constipation.    Probiotic Product (PROBIOTIC FORMULA PO) Take 1-2 capsules by mouth every morning.   rosuvastatin (CRESTOR) 10 MG tablet Take 1 tablet (10 mg total) by mouth daily.   shark liver oil-cocoa butter (PREPARATION H) 0.25-3-85.5 % suppository Place 1 suppository rectally as needed for hemorrhoids.   traZODone (DESYREL) 50 MG tablet TAKE 1/2 TO 1 TABLET (25- 50MG  TOTAL) BY MOUTH AT BEDTIME AS NEEDED FOR SLEEP.   vitamin E 400 UNIT capsule Take 400 Units by mouth every morning.   [DISCONTINUED] busPIRone (BUSPAR) 10 MG tablet Take 1 tablet (10 mg total) by mouth 3 (three) times daily.   [DISCONTINUED] sodium chloride (OCEAN) 0.65 % SOLN nasal spray Place 2 sprays into the nose as needed for congestion.   No facility-administered encounter medications on file  as of 11/24/2022.    Past Surgical History:  Procedure Laterality Date   ABDOMINAL HYSTERECTOMY     BIOPSY N/A 12/28/2014   Procedure: SIGMOID AND RECTAL BIOPSIES;  Surgeon: Malissa Hippo, MD;  Location: [facility];  Service: Endoscopy;  Laterality: N/A;   CHOLECYSTECTOMY     s/p   COLONOSCOPY  06/16/06. 04/27/2007   proctitis seen, biopsies showed chronic active colitis, no dysplasia    EYE SURGERY     clear lense extraction 1998    FLEXIBLE SIGMOIDOSCOPY N/A 12/28/2014   Procedure: FLEXIBLE SIGMOIDOSCOPY WITH PROPOFOL;  Surgeon: Malissa Hippo, MD;  Location: [facility];  Service: Endoscopy;  Laterality: N/A;    Family History  Problem Relation Age of Onset   Heart attack Mother 74       Died in her sleep   Diabetes Mother    Heart attack Father 20       Died in her sleep   Cancer Sister        liver, breast    Heart disease Sister    Breast cancer Maternal Aunt    Breast cancer Maternal Aunt    Heart attack Brother 38       Defib, CABG   Diabetes Brother    Kidney disease Brother        dyalasis       Controlled substance contract: ***     Review of Systems     Objective:   Physical Exam        Assessment &  Plan:

## 2022-11-25 ENCOUNTER — Other Ambulatory Visit: Payer: Self-pay | Admitting: Nurse Practitioner

## 2022-11-25 DIAGNOSIS — F3342 Major depressive disorder, recurrent, in full remission: Secondary | ICD-10-CM

## 2022-11-26 ENCOUNTER — Encounter: Payer: Self-pay | Admitting: Nurse Practitioner

## 2022-12-12 ENCOUNTER — Other Ambulatory Visit: Payer: Self-pay | Admitting: Nurse Practitioner

## 2022-12-12 DIAGNOSIS — M797 Fibromyalgia: Secondary | ICD-10-CM

## 2022-12-27 DIAGNOSIS — F32A Depression, unspecified: Secondary | ICD-10-CM | POA: Diagnosis not present

## 2022-12-27 DIAGNOSIS — M1812 Unilateral primary osteoarthritis of first carpometacarpal joint, left hand: Secondary | ICD-10-CM | POA: Diagnosis not present

## 2022-12-27 DIAGNOSIS — W19XXXA Unspecified fall, initial encounter: Secondary | ICD-10-CM | POA: Diagnosis not present

## 2022-12-27 DIAGNOSIS — J9811 Atelectasis: Secondary | ICD-10-CM | POA: Diagnosis not present

## 2022-12-27 DIAGNOSIS — Z7984 Long term (current) use of oral hypoglycemic drugs: Secondary | ICD-10-CM | POA: Diagnosis not present

## 2022-12-27 DIAGNOSIS — R0789 Other chest pain: Secondary | ICD-10-CM | POA: Diagnosis not present

## 2022-12-27 DIAGNOSIS — M79603 Pain in arm, unspecified: Secondary | ICD-10-CM | POA: Diagnosis not present

## 2022-12-27 DIAGNOSIS — S42492A Other displaced fracture of lower end of left humerus, initial encounter for closed fracture: Secondary | ICD-10-CM | POA: Diagnosis not present

## 2022-12-27 DIAGNOSIS — R9431 Abnormal electrocardiogram [ECG] [EKG]: Secondary | ICD-10-CM | POA: Diagnosis not present

## 2022-12-27 DIAGNOSIS — D1809 Hemangioma of other sites: Secondary | ICD-10-CM | POA: Diagnosis not present

## 2022-12-27 DIAGNOSIS — N179 Acute kidney failure, unspecified: Secondary | ICD-10-CM | POA: Diagnosis not present

## 2022-12-27 DIAGNOSIS — M19032 Primary osteoarthritis, left wrist: Secondary | ICD-10-CM | POA: Diagnosis not present

## 2022-12-27 DIAGNOSIS — I10 Essential (primary) hypertension: Secondary | ICD-10-CM | POA: Diagnosis not present

## 2022-12-27 DIAGNOSIS — Z880 Allergy status to penicillin: Secondary | ICD-10-CM | POA: Diagnosis not present

## 2022-12-27 DIAGNOSIS — I059 Rheumatic mitral valve disease, unspecified: Secondary | ICD-10-CM | POA: Diagnosis not present

## 2022-12-27 DIAGNOSIS — E871 Hypo-osmolality and hyponatremia: Secondary | ICD-10-CM | POA: Diagnosis not present

## 2022-12-27 DIAGNOSIS — E119 Type 2 diabetes mellitus without complications: Secondary | ICD-10-CM | POA: Diagnosis not present

## 2022-12-27 DIAGNOSIS — S52612A Displaced fracture of left ulna styloid process, initial encounter for closed fracture: Secondary | ICD-10-CM | POA: Diagnosis not present

## 2022-12-27 DIAGNOSIS — R519 Headache, unspecified: Secondary | ICD-10-CM | POA: Diagnosis not present

## 2022-12-27 DIAGNOSIS — R000 Tachycardia, unspecified: Secondary | ICD-10-CM | POA: Diagnosis not present

## 2022-12-27 DIAGNOSIS — Z881 Allergy status to other antibiotic agents status: Secondary | ICD-10-CM | POA: Diagnosis not present

## 2022-12-27 DIAGNOSIS — F419 Anxiety disorder, unspecified: Secondary | ICD-10-CM | POA: Diagnosis not present

## 2022-12-27 DIAGNOSIS — Z79899 Other long term (current) drug therapy: Secondary | ICD-10-CM | POA: Diagnosis not present

## 2022-12-27 DIAGNOSIS — I700 Atherosclerosis of aorta: Secondary | ICD-10-CM | POA: Diagnosis not present

## 2022-12-27 DIAGNOSIS — Z882 Allergy status to sulfonamides status: Secondary | ICD-10-CM | POA: Diagnosis not present

## 2022-12-27 DIAGNOSIS — R079 Chest pain, unspecified: Secondary | ICD-10-CM | POA: Diagnosis not present

## 2022-12-27 DIAGNOSIS — E785 Hyperlipidemia, unspecified: Secondary | ICD-10-CM | POA: Diagnosis not present

## 2022-12-27 DIAGNOSIS — G4489 Other headache syndrome: Secondary | ICD-10-CM | POA: Diagnosis not present

## 2022-12-27 DIAGNOSIS — E876 Hypokalemia: Secondary | ICD-10-CM | POA: Diagnosis not present

## 2022-12-27 DIAGNOSIS — M797 Fibromyalgia: Secondary | ICD-10-CM | POA: Diagnosis not present

## 2022-12-28 DIAGNOSIS — E785 Hyperlipidemia, unspecified: Secondary | ICD-10-CM | POA: Diagnosis not present

## 2022-12-28 DIAGNOSIS — F32A Depression, unspecified: Secondary | ICD-10-CM | POA: Diagnosis not present

## 2022-12-28 DIAGNOSIS — R079 Chest pain, unspecified: Secondary | ICD-10-CM | POA: Diagnosis not present

## 2022-12-28 DIAGNOSIS — I10 Essential (primary) hypertension: Secondary | ICD-10-CM | POA: Diagnosis not present

## 2022-12-28 DIAGNOSIS — I3489 Other nonrheumatic mitral valve disorders: Secondary | ICD-10-CM | POA: Diagnosis not present

## 2022-12-28 DIAGNOSIS — F419 Anxiety disorder, unspecified: Secondary | ICD-10-CM | POA: Diagnosis not present

## 2023-01-04 ENCOUNTER — Encounter: Payer: Self-pay | Admitting: Family Medicine

## 2023-01-04 ENCOUNTER — Ambulatory Visit: Payer: Medicare Other | Admitting: Family Medicine

## 2023-01-04 DIAGNOSIS — F5101 Primary insomnia: Secondary | ICD-10-CM

## 2023-01-04 MED ORDER — TRAZODONE HCL 150 MG PO TABS
ORAL_TABLET | ORAL | 2 refills | Status: DC
Start: 2023-01-04 — End: 2023-02-04

## 2023-01-04 NOTE — Progress Notes (Signed)
Subjective:  Patient ID: [name], female    DOB: 01/14/1941  Age: 82 y.o. MRN: [id number]  CC: Hospitalization Follow-up   HPI [name] presents for fatigue brought on by lack of sleep. She lays awake worrying and pondering things. This prevents sleep. She has a history of fibromyalgia and anxiety for which she takes Lexapro and buspirone. She is having frequent HA. She is concerned her meds, specifically the cholesterol med may be the cause.      01/04/2023    2:01 PM 01/04/2023    1:46 PM 08/11/2022    2:23 PM  Depression screen PHQ 2/9  Decreased Interest 1 0 1  Down, Depressed, Hopeless 1 0 1  PHQ - 2 Score 2 0 2  Altered sleeping 1  1  Tired, decreased energy 1  1  Change in appetite 1  2  Feeling bad or failure about yourself  0  0  Trouble concentrating 0  0  Moving slowly or fidgety/restless 0  0  Suicidal thoughts 0  0  PHQ-9 Score 5  6  Difficult doing work/chores Somewhat difficult  Somewhat difficult    History [name] has a past medical history of Anemia due to blood loss (05/02/2011), Anxiety, Arthritis, Collagen vascular disease (HCC), Diabetes mellitus without complication (HCC), Fibromyalgia, Hyperglycemia, drug-induced (05/02/2011), Hypertension, and Ulcerative colitis.   She has a past surgical history that includes Cholecystectomy; Colonoscopy (06/16/06. 04/27/2007); Abdominal hysterectomy; Flexible sigmoidoscopy (N/A, 12/28/2014); biopsy (N/A, 12/28/2014); and Eye surgery.   Her family history includes Breast cancer in her maternal aunt and maternal aunt; Cancer in her sister; Diabetes in her brother and mother; Heart attack (age of onset: 30) in her brother; Heart attack (age of onset: 88) in her father and mother; Heart disease in her sister; Kidney disease in her brother.She reports that she has never smoked. She has never used smokeless tobacco. She reports that she does not drink alcohol and does not use drugs.    ROS Review of Systems   Constitutional: Negative.   HENT: Negative.    Eyes:  Negative for visual disturbance.  Respiratory:  Negative for shortness of breath.   Cardiovascular:  Negative for chest pain.  Gastrointestinal:  Negative for abdominal pain.  Psychiatric/Behavioral:  Positive for decreased concentration, dysphoric mood and sleep disturbance. The patient is nervous/anxious.     Objective:  BP (!) 156/89   Pulse 93   Temp 97.7 F (36.5 C)   Ht 5\' 9"  (1.753 m)   Wt 159 lb 6.4 oz (72.3 kg)   SpO2 96%   BMI 23.54 kg/m   BP Readings from Last 3 Encounters:  01/04/23 (!) 156/89  08/11/22 (!) 158/86  04/30/22 (!) 140/86    Wt Readings from Last 3 Encounters:  01/04/23 159 lb 6.4 oz (72.3 kg)  08/11/22 163 lb (73.9 kg)  04/30/22 166 lb (75.3 kg)     Physical Exam Constitutional:      General: She is not in acute distress.    Appearance: She is well-developed.  Cardiovascular:     Rate and Rhythm: Normal rate and regular rhythm.  Pulmonary:     Breath sounds: Normal breath sounds.  Musculoskeletal:        General: Normal range of motion.  Skin:    General: Skin is warm and dry.  Neurological:     Mental Status: She is alert and oriented to person, place, and time.       Assessment & Plan:   [name]  was seen today for hospitalization follow-up.  Diagnoses and all orders for this visit:  Primary insomnia -     traZODone (DESYREL) 150 MG tablet; TAKE 1/2 TO 1 TABLET BY MOUTH AT BEDTIME AS NEEDED FOR SLEEP.       I have discontinued Amanti Bakke's rosuvastatin. I have also changed her traZODone. Additionally, I am having her maintain her co-enzyme Q-10, Probiotic Product (PROBIOTIC FORMULA PO), Multiple Vitamins-Minerals (WOMENS MULTI VITAMIN & MINERAL PO), vitamin E, Fish Oil, antiseptic oral rinse, shark liver oil-cocoa butter, Loperamide HCl (IMODIUM PO), polyethylene glycol, OneTouch Verio Flex System, hydrocortisone, escitalopram, OneTouch Delica Plus Lancet33G,  methocarbamol, ondansetron, dicyclomine, oxyCODONE, oxyCODONE, oxyCODONE, amLODipine, Januvia, benazepril-hydrochlorthiazide, OneTouch Verio, and busPIRone.  Allergies as of 01/04/2023       Reactions   Azathioprine Nausea And Vomiting, Other (See Comments)   SEVERE NAUSEA AND VOMITING WITH CHEST PAIN-  PATIENT INSISTS NEVER TO BE GIVEN ANYTHING SIMILAR TO THIS   Tape Other (See Comments), Rash   BLISTERS AND SKIN TEARING   Codeine Nausea And Vomiting   Milk (cow) Nausea And Vomiting   Penicillins Hives   Has patient had a PCN reaction causing immediate rash, facial/tongue/throat swelling, SOB or lightheadedness with hypotension: No Has patient had a PCN reaction causing severe rash involving mucus membranes or skin necrosis: No Has patient had a PCN reaction that required hospitalization: No Has patient had a PCN reaction occurring within the last 10 years: No If all of the above answers are "NO", then may proceed with Cephalosporin use.   Povidone-iodine Hives   BETADINE   Procaine Hcl Other (See Comments)   SEVERE GI UPSET (NAUSEA,VOMITING AND DIARRHEA)   Sulfonamide Derivatives Hives   Tramadol Itching   Acyclovir And Related    Azithromycin    Mycins    Clonazepam Other (See Comments)   Skin burning, insomnia, diarrhea.   Dairy Aid [tilactase]    Doxycycline Rash   Latex Rash   Niacin And Related Rash        Medication List        Accurate as of January 04, 2023  6:32 PM. If you have any questions, ask your nurse or doctor.          STOP taking these medications    rosuvastatin 10 MG tablet Commonly known as: CRESTOR Stopped by: Raudel Bazen       TAKE these medications    amLODipine 2.5 MG tablet Commonly known as: NORVASC TAKE 1 TABLET ONCE DAILY.   antiseptic oral rinse Liqd 1 application by Mouth Rinse route daily. FOR DRY MOUTH   benazepril-hydrochlorthiazide 20-12.5 MG tablet Commonly known as: LOTENSIN HCT Take 2 tablets by mouth daily.    busPIRone 10 MG tablet Commonly known as: BUSPAR Take 1 tablet (10 mg total) by mouth 3 (three) times daily. **NEEDS TO BE SEEN BEFORE NEXT REFILL**   co-enzyme Q-10 50 MG capsule Take 50 mg by mouth once a week.   dicyclomine 20 MG tablet Commonly known as: BENTYL TAKE ONE-HALF TO 1 TABLET DAILY   escitalopram 20 MG tablet Commonly known as: LEXAPRO Take 1 tablet (20 mg total) by mouth daily.   Fish Oil 1000 MG Cpdr Take 3 capsules by mouth daily.   hydrocortisone 2.5 % rectal cream Commonly known as: ANUSOL-HC PLACE 1 APPLICATION RECTALLY 2 TIMES A DAY   IMODIUM PO Take 1 tablet by mouth daily as needed (diarrhea).   Januvia 100 MG tablet Generic drug: sitaGLIPtin Take 1 tablet (100  mg total) by mouth daily.   methocarbamol 500 MG tablet Commonly known as: ROBAXIN TAKE 1 TABLET EVERY 6 HOURS AS NEEDED FOR MUSCLE SPASMS   ondansetron 4 MG tablet Commonly known as: ZOFRAN TAKE ONE TABLET EVERY 8 HOURS AS NEEDED FOR NAUSEA AND VOMITING   OneTouch Delica Plus Lancet33G Misc CHECK BLOOD SUGAR UP TO 4 TIMES A DAY OR AS DIRECTED   OneTouch Verio Flex System w/Device Kit USE AS DIRECTED WITH TESTING SUPPLIES UP TO 4 TIMES A DAY   OneTouch Verio test strip Generic drug: glucose blood Test BS up to 4 times daily or as directed Dx E11.22   oxyCODONE 5 MG immediate release tablet Commonly known as: Oxy IR/ROXICODONE Take 1 tablet (5 mg total) by mouth in the morning and at bedtime. TAKE (1) TABLET TWICE DAILY.   oxyCODONE 5 MG immediate release tablet Commonly known as: Oxy IR/ROXICODONE Take 1 tablet (5 mg total) by mouth in the morning and at bedtime. TAKE (1) TABLET TWICE DAILY.   oxyCODONE 5 MG immediate release tablet Commonly known as: Oxy IR/ROXICODONE Take 1 tablet (5 mg total) by mouth in the morning and at bedtime. TAKE (1) TABLET TWICE DAILY.   polyethylene glycol 17 g packet Commonly known as: MIRALAX / GLYCOLAX Take 1 packet daily with 8 ounces of  water as needed for constipation.   PROBIOTIC FORMULA PO Take 1-2 capsules by mouth every morning.   shark liver oil-cocoa butter 0.25-3-85.5 % suppository Commonly known as: PREPARATION H Place 1 suppository rectally as needed for hemorrhoids.   traZODone 150 MG tablet Commonly known as: DESYREL TAKE 1/2 TO 1 TABLET BY MOUTH AT BEDTIME AS NEEDED FOR SLEEP. What changed:  medication strength additional instructions Changed by: Broadus John Zylie Mumaw   vitamin E 180 MG (400 UNITS) capsule Take 400 Units by mouth every morning.   WOMENS MULTI VITAMIN & MINERAL PO Take 1 tablet by mouth every other day.       She should be seen by her PCP for further determination of meds causing side effect of HA.  Follow-up: Return in about 1 month (around 02/04/2023) for with PCP [facility]).  Mechele Claude, M.D.

## 2023-01-13 DIAGNOSIS — K080 Exfoliation of teeth due to systemic causes: Secondary | ICD-10-CM | POA: Diagnosis not present

## 2023-01-14 ENCOUNTER — Other Ambulatory Visit: Payer: Self-pay | Admitting: *Deleted

## 2023-01-14 DIAGNOSIS — I10 Essential (primary) hypertension: Secondary | ICD-10-CM

## 2023-01-14 MED ORDER — BENAZEPRIL-HYDROCHLOROTHIAZIDE 20-12.5 MG PO TABS: 2.0000 | ORAL_TABLET | Freq: Every day | ORAL | 0 refills | Status: DC

## 2023-01-15 ENCOUNTER — Telehealth: Payer: Self-pay | Admitting: Nurse Practitioner

## 2023-01-15 DIAGNOSIS — K5903 Drug induced constipation: Secondary | ICD-10-CM

## 2023-01-15 MED ORDER — HYDROCORTISONE (PERIANAL) 2.5 % EX CREA: TOPICAL_CREAM | Freq: Two times a day (BID) | CUTANEOUS | 5 refills | Status: DC

## 2023-01-15 NOTE — Telephone Encounter (Signed)
  Prescription Request  01/15/2023  What is the name of the medication or equipment? hydrocortisone (ANUSOL-HC) 2.5 % rectal cream  Have you contacted your pharmacy to request a refill? YES  Which pharmacy would you like this sent to? [facility]   Patient notified that their request is being sent to the clinical staff for review and that they should receive a response within 2 business days.

## 2023-01-15 NOTE — Telephone Encounter (Signed)
Rx sent to [facility].

## 2023-01-21 ENCOUNTER — Other Ambulatory Visit: Payer: Medicare Other

## 2023-01-21 ENCOUNTER — Ambulatory Visit: Payer: Medicare Other | Admitting: Nurse Practitioner

## 2023-01-24 NOTE — Progress Notes (Deleted)
Cardiology Office Note   Date:  01/24/2023   ID:  [name], DOB January 01, 1941, MRN [id number]  PCP:  Bennie Pierini, FNP  Cardiologist:   None Referring:  ***  No chief complaint on file.     History of Present Illness: [name] is a 82 y.o. female who presents for follow up of chest pain and a murmur.  She saw Dr. Wyline Mood for this.  The pain was felt to be atypical.  She saw him in 2019.  She did have an negative Lexiscan Myoview in 2007.  I last saw hier in 2021.  She had non anginal chest pain.  ***     ***She was to have an echo after her last appt but I don't see that this was done.   She said that for some reason that she has been get scheduled.  She is back for routine follow-up.  She is having some right shoulder pain and does remember doing anything to it but it hurts to move her shoulder.  She is seeing a [facility].  It is a sharp discomfort.  She is not having any chest pressure, neck or left arm discomfort.  She is not having any new shortness of breath, PND orthopnea.  She has had no palpitations, presyncope or syncope.  Of note she was doing yard work about a month ago before her shoulder started bothering her and she was not having any problems with this.    Past Medical History:  Diagnosis Date   Anemia due to blood loss 05/02/2011   Anxiety    Arthritis    rheumatoid    Collagen vascular disease (HCC)    ra   Diabetes mellitus without complication (HCC)    Fibromyalgia    Hyperglycemia, drug-induced 05/02/2011   Hypertension    Ulcerative colitis     Past Surgical History:  Procedure Laterality Date   ABDOMINAL HYSTERECTOMY     BIOPSY N/A 12/28/2014   Procedure: SIGMOID AND RECTAL BIOPSIES;  Surgeon: Malissa Hippo, MD;  Location: [facility];  Service: Endoscopy;  Laterality: N/A;   CHOLECYSTECTOMY     s/p   COLONOSCOPY  06/16/06. 04/27/2007   proctitis seen, biopsies showed chronic active colitis, no dysplasia    EYE SURGERY     clear lense  extraction 1998    FLEXIBLE SIGMOIDOSCOPY N/A 12/28/2014   Procedure: FLEXIBLE SIGMOIDOSCOPY WITH PROPOFOL;  Surgeon: Malissa Hippo, MD;  Location: [facility];  Service: Endoscopy;  Laterality: N/A;     Current Outpatient Medications  Medication Sig Dispense Refill   amLODipine (NORVASC) 2.5 MG tablet TAKE 1 TABLET ONCE DAILY. 30 tablet 5   antiseptic oral rinse (BIOTENE) LIQD 1 application by Mouth Rinse route daily. FOR DRY MOUTH     benazepril-hydrochlorthiazide (LOTENSIN HCT) 20-12.5 MG tablet Take 2 tablets by mouth daily. 180 tablet 0   Blood Glucose Monitoring Suppl (ONETOUCH VERIO FLEX SYSTEM) w/Device KIT USE AS DIRECTED WITH TESTING SUPPLIES UP TO 4 TIMES A DAY 1 kit 0   busPIRone (BUSPAR) 10 MG tablet Take 1 tablet (10 mg total) by mouth 3 (three) times daily. **NEEDS TO BE SEEN BEFORE NEXT REFILL** 90 tablet 0   co-enzyme Q-10 50 MG capsule Take 50 mg by mouth once a week.     dicyclomine (BENTYL) 20 MG tablet TAKE ONE-HALF TO 1 TABLET DAILY 30 tablet 0   escitalopram (LEXAPRO) 20 MG tablet Take 1 tablet (20 mg total) by mouth daily. 30 tablet 2  glucose blood (ONETOUCH VERIO) test strip Test BS up to 4 times daily or as directed Dx E11.22 400 strip 3   hydrocortisone (ANUSOL-HC) 2.5 % rectal cream Place rectally 2 (two) times daily. 30 g 5   JANUVIA 100 MG tablet Take 1 tablet (100 mg total) by mouth daily. 90 tablet 1   Lancets (ONETOUCH DELICA PLUS LANCET33G) MISC CHECK BLOOD SUGAR UP TO 4 TIMES A DAY OR AS DIRECTED 200 each 11   Loperamide HCl (IMODIUM PO) Take 1 tablet by mouth daily as needed (diarrhea).     methocarbamol (ROBAXIN) 500 MG tablet TAKE 1 TABLET EVERY 6 HOURS AS NEEDED FOR MUSCLE SPASMS 30 tablet 0   Multiple Vitamins-Minerals (WOMENS MULTI VITAMIN & MINERAL PO) Take 1 tablet by mouth every other day.     Omega-3 Fatty Acids (FISH OIL) 1000 MG CPDR Take 3 capsules by mouth daily.      ondansetron (ZOFRAN) 4 MG tablet TAKE ONE TABLET EVERY 8 HOURS AS NEEDED FOR  NAUSEA AND VOMITING 20 tablet 0   oxyCODONE (OXY IR/ROXICODONE) 5 MG immediate release tablet Take 1 tablet (5 mg total) by mouth in the morning and at bedtime. TAKE (1) TABLET TWICE DAILY. 60 tablet 0   oxyCODONE (OXY IR/ROXICODONE) 5 MG immediate release tablet Take 1 tablet (5 mg total) by mouth in the morning and at bedtime. TAKE (1) TABLET TWICE DAILY. 60 tablet 0   oxyCODONE (OXY IR/ROXICODONE) 5 MG immediate release tablet Take 1 tablet (5 mg total) by mouth in the morning and at bedtime. TAKE (1) TABLET TWICE DAILY. 60 tablet 0   polyethylene glycol (MIRALAX / GLYCOLAX) 17 g packet Take 1 packet daily with 8 ounces of water as needed for constipation. 14 each 0   Probiotic Product (PROBIOTIC FORMULA PO) Take 1-2 capsules by mouth every morning.     shark liver oil-cocoa butter (PREPARATION H) 0.25-3-85.5 % suppository Place 1 suppository rectally as needed for hemorrhoids.     traZODone (DESYREL) 150 MG tablet TAKE 1/2 TO 1 TABLET BY MOUTH AT BEDTIME AS NEEDED FOR SLEEP. 30 tablet 2   vitamin E 400 UNIT capsule Take 400 Units by mouth every morning.     No current facility-administered medications for this visit.    Allergies:   Azathioprine, Tape, Codeine, Milk (cow), Penicillins, Povidone-iodine, Procaine hcl, Sulfonamide derivatives, Tramadol, Acyclovir and related, Azithromycin, Clonazepam, Dairy aid [tilactase], Doxycycline, Latex, and Niacin and related    Social History:  The patient  reports that she has never smoked. She has never used smokeless tobacco. She reports that she does not drink alcohol and does not use drugs.   Family History:  The patient's ***family history includes Breast cancer in her maternal aunt and maternal aunt; Cancer in her sister; Diabetes in her brother and mother; Heart attack (age of onset: 54) in her brother; Heart attack (age of onset: 61) in her father and mother; Heart disease in her sister; Kidney disease in her brother.    ROS:  Please see the  history of present illness.   Otherwise, review of systems are positive for {NONE DEFAULTED:18576}.   All other systems are reviewed and negative.    PHYSICAL EXAM: VS:  There were no vitals taken for this visit. , BMI There is no height or weight on file to calculate BMI. GENERAL:  Well appearing HEENT:  Pupils equal round and reactive, fundi not visualized, oral mucosa unremarkable NECK:  No jugular venous distention, waveform within normal limits, carotid  upstroke brisk and symmetric, no bruits, no thyromegaly LYMPHATICS:  No cervical, inguinal adenopathy LUNGS:  Clear to auscultation bilaterally BACK:  No CVA tenderness CHEST:  Unremarkable HEART:  PMI not displaced or sustained,S1 and S2 within normal limits, no S3, no S4, no clicks, no rubs, *** murmurs ABD:  Flat, positive bowel sounds normal in frequency in pitch, no bruits, no rebound, no guarding, no midline pulsatile mass, no hepatomegaly, no splenomegaly EXT:  2 plus pulses throughout, no edema, no cyanosis no clubbing SKIN:  No rashes no nodules NEURO:  Cranial nerves II through XII grossly intact, motor grossly intact throughout PSYCH:  Cognitively intact, oriented to person place and time    EKG:        Recent Labs: 08/11/2022: ALT 14; BUN 21; Creatinine, Ser 1.11; Hemoglobin 12.7; Platelets 310; Potassium 3.8; Sodium 123    Lipid Panel    Component Value Date/Time   CHOL 137 08/11/2022 1427   CHOL 200 (H) 08/01/2012 1100   TRIG 156 (H) 08/11/2022 1427   TRIG 294 (H) 06/08/2014 1639   TRIG 171 (H) 08/01/2012 1100   HDL 42 08/11/2022 1427   HDL 44 06/08/2014 1639   HDL 55 08/01/2012 1100   CHOLHDL 3.3 08/11/2022 1427   CHOLHDL 6.1 03/04/2007 0420   VLDL 26 03/04/2007 0420   LDLCALC 68 08/11/2022 1427   LDLCALC 81 12/28/2013 1106   LDLCALC 111 (H) 08/01/2012 1100      Wt Readings from Last 3 Encounters:  01/04/23 159 lb 6.4 oz (72.3 kg)  08/11/22 163 lb (73.9 kg)  04/30/22 166 lb (75.3 kg)       Other studies Reviewed: Additional studies/ records that were reviewed today include: ***. Review of the above records demonstrates:  Please see elsewhere in the note.  ***   ASSESSMENT AND PLAN:  CHEST PAIN:   ***  She does not have chest pain that she described previously.  No further work-up.   HTN: Her blood pressure is ***  not well controlled.  Of asked her to get up blood pressure diary.  I am going to increase benazepril HCT to 40/25. Marland Kitchen  She can double up on the pills she has until she has run out of these.   Current medicines are reviewed at length with the patient today.  The patient {ACTIONS; HAS/DOES NOT HAVE:19233} concerns regarding medicines.  The following changes have been made:  {PLAN; NO CHANGE:13088:s}  Labs/ tests ordered today include: *** No orders of the defined types were placed in this encounter.    Disposition:   FU with ***    Signed, Rollene Rotunda, MD  01/24/2023 8:37 PM    [facility]

## 2023-01-27 ENCOUNTER — Ambulatory Visit: Payer: Medicare Other | Admitting: Cardiology

## 2023-01-27 DIAGNOSIS — R072 Precordial pain: Secondary | ICD-10-CM

## 2023-01-27 DIAGNOSIS — I10 Essential (primary) hypertension: Secondary | ICD-10-CM

## 2023-02-04 ENCOUNTER — Encounter: Payer: Self-pay | Admitting: Nurse Practitioner

## 2023-02-04 ENCOUNTER — Ambulatory Visit (INDEPENDENT_AMBULATORY_CARE_PROVIDER_SITE_OTHER): Payer: Medicare Other | Admitting: Nurse Practitioner

## 2023-02-04 ENCOUNTER — Other Ambulatory Visit: Payer: Medicare Other

## 2023-02-04 VITALS — BP 169/84 | HR 86 | Temp 97.8°F | Ht 69.0 in | Wt 161.0 lb

## 2023-02-04 DIAGNOSIS — I10 Essential (primary) hypertension: Secondary | ICD-10-CM

## 2023-02-04 DIAGNOSIS — E1169 Type 2 diabetes mellitus with other specified complication: Secondary | ICD-10-CM | POA: Diagnosis not present

## 2023-02-04 DIAGNOSIS — K5150 Left sided colitis without complications: Secondary | ICD-10-CM

## 2023-02-04 DIAGNOSIS — R001 Bradycardia, unspecified: Secondary | ICD-10-CM | POA: Diagnosis not present

## 2023-02-04 DIAGNOSIS — E785 Hyperlipidemia, unspecified: Secondary | ICD-10-CM | POA: Diagnosis not present

## 2023-02-04 DIAGNOSIS — M797 Fibromyalgia: Secondary | ICD-10-CM

## 2023-02-04 DIAGNOSIS — N1832 Chronic kidney disease, stage 3b: Secondary | ICD-10-CM

## 2023-02-04 DIAGNOSIS — E119 Type 2 diabetes mellitus without complications: Secondary | ICD-10-CM

## 2023-02-04 DIAGNOSIS — E1122 Type 2 diabetes mellitus with diabetic chronic kidney disease: Secondary | ICD-10-CM

## 2023-02-04 DIAGNOSIS — F5101 Primary insomnia: Secondary | ICD-10-CM

## 2023-02-04 DIAGNOSIS — F3342 Major depressive disorder, recurrent, in full remission: Secondary | ICD-10-CM | POA: Diagnosis not present

## 2023-02-04 DIAGNOSIS — Z7984 Long term (current) use of oral hypoglycemic drugs: Secondary | ICD-10-CM

## 2023-02-04 LAB — BAYER DCA HB A1C WAIVED: HB A1C (BAYER DCA - WAIVED): 7.3 % — ABNORMAL HIGH (ref 4.8–5.6)

## 2023-02-04 MED ORDER — AMLODIPINE BESYLATE 5 MG PO TABS: 5.0000 mg | ORAL_TABLET | Freq: Every day | ORAL | 1 refills | Status: DC

## 2023-02-04 MED ORDER — OXYCODONE HCL 5 MG PO TABS: 5.0000 mg | ORAL_TABLET | Freq: Two times a day (BID) | ORAL | 0 refills | Status: DC

## 2023-02-04 MED ORDER — ESCITALOPRAM OXALATE 20 MG PO TABS: 20.0000 mg | ORAL_TABLET | Freq: Every day | ORAL | 2 refills | Status: DC

## 2023-02-04 MED ORDER — DICYCLOMINE HCL 20 MG PO TABS
ORAL_TABLET | ORAL | 5 refills | Status: DC
Start: 2023-02-04 — End: 2023-08-03

## 2023-02-04 MED ORDER — JANUVIA 100 MG PO TABS: 100.0000 mg | ORAL_TABLET | Freq: Every day | ORAL | 1 refills | Status: DC

## 2023-02-04 MED ORDER — TRAZODONE HCL 150 MG PO TABS: ORAL_TABLET | ORAL | 2 refills | Status: DC

## 2023-02-04 MED ORDER — BUSPIRONE HCL 10 MG PO TABS: 10.0000 mg | ORAL_TABLET | Freq: Three times a day (TID) | ORAL | 0 refills | Status: DC

## 2023-02-04 NOTE — Patient Instructions (Signed)
Opioid Pain Medicine Management Opioid pain medicines are strong medicines that are used to treat bad or very bad pain. When you take them for a short time, they can help you: Sleep better. Do better in physical therapy. Feel better during the first few days after you get hurt. Recover from surgery. Only take these medicines if a doctor says that you can. You should only take them for a short time. This is because opioids can be very addictive. This means that they are hard to stop taking. The longer you take opioids, the harder it may be to stop taking them. What are the risks? Opioids can cause problems (side effects). Taking them for more than 3 days raises your chance of problems, such as: Trouble pooping (constipation). Feeling sick to your stomach (nausea). Vomiting. Feeling very sleepy. Confusion. Not being able to stop taking the medicine. Breathing problems. Taking opioids for a long time can make it hard for you to do daily tasks. It can also put you at risk for: Car accidents. Depression. Suicide. Heart attack. Taking too much of the medicine (overdose). This can lead to death. What is a pain treatment plan? A pain treatment plan is a plan made by you and your doctor. Work with your doctor to make a plan for treating your pain. To help you do this: Talk about the goals of your treatment, including: How much pain you might expect to have. How you will manage the pain. Talk about the risks and benefits of taking these medicines for your condition. Remember that a good treatment plan uses more than one approach and lowers the risks of side effects. Tell your doctor about the amount of medicines you take and about any drug or alcohol use. Get your pain medicine prescriptions from only one doctor. Pain can be managed with other treatments. Work with your doctor to find other ways to help your pain, such as: Physical therapy or doing gentle exercises. Counseling. Eating healthy  foods. Massage. Meditation. Other pain medicines. How to use opioid pain medicine safely Taking medicine Take your pain medicine exactly as told by your doctor. Take it only when you need it. If your pain is not too bad, you may take less medicine if your doctor allows. If you have no pain, do not take the medicine unless your doctor tells you to take it. If your pain is very bad, do not take more medicine than your doctor told you to take. Call your doctor to know what to do. Write down the times when you take your pain medicine. Look at the times before you take your next dose. Take other over-the-counter or prescription medicines only as told by your doctor. Keeping yourself and others safe  While you are taking opioids: Do not drive, use machines, or power tools. Do not sign important papers (legal documents). Do not drink alcohol. Do not take sleeping pills. Do not take care of children by yourself. Do not do activities where you need to climb or be in high places, like working on a ladder. Do not go to a lake, river, ocean, swimming pool, or hot tub. Keep your opioids locked up or in a place where children cannot reach them. Do not share your pain medicine with anyone. Stopping your use of opioids If you have been taking opioids for more than a few weeks, you may need to slowly decrease (taper) how much you take until you stop taking them. Doing this can lower your chance   of having symptoms.  Symptoms that come from suddenly stopping the use of opioids include: Pain and cramping in your belly (abdomen). Feeling sick to your stomach (nausea).z Sweating. Feeling very sleepy. Feeling restless. Shaking you cannot control (tremors). Cravings for the medicine. Do not try to stop taking them by yourself. Work with your doctor to stop. Your doctor will help you take less until you are not taking the medicine at all. Getting rid of unused pills Do not save any pills that you did not  use. Get rid of the pills by: Taking them to a take-back program in your area. Bringing them to a pharmacy that receives unused pills. Flushing them down the toilet. Check the label or package insert of your medicine to see whether this is safe to do. Throwing them in the trash. Check the label or package insert of your medicine to see whether this is safe to do. If it is safe to throw them out: Take the pills out of their container. Put the pills into a container you can seal. Mix the pills with used coffee grounds, food scraps, dirt, or cat litter. Put this in the trash. Follow these instructions at home: Activity Do exercises as told by your doctor. Avoid doing things that make your pain worse. Return to your normal activities as told by your doctor. Ask your doctor what activities are safe for you. General instructions You may need to take these actions to prevent or treat constipation: Drink enough fluid to keep your pee (urine) pale yellow. Take over-the-counter or prescription medicines. Eat foods that are high in fiber. These include beans, whole grains, and fresh fruits and vegetables. Limit foods that are high in fat and sugar. These include fried or sweet foods. Keep all follow-up visits. Where to find support If you have been taking opioids for a long time, get help from a local support group or counselor. Ask your doctor about this. Where to find more information Centers for Disease Control and Prevention (CDC): www.cdc.gov [place] Food and Drug Administration (FDA): www.fda.gov Get help right away if: You may have taken too much of an opioid (overdosed). Common symptoms of an overdose: Your breathing is slower or more shallow than normal. You have a very slow heartbeat. Your speech is not normal. You vomit or you feel as if you may vomit. The black centers of your eyes (pupils) are smaller than normal. You have other potential symptoms: You feel very confused. You  faint. You are very sleepy. You have cold skin. You have blue lips or fingernails. You have thoughts of harming yourself or harming others. These symptoms may be an emergency. Get help right away. Call your local emergency services (911 in the [place] Do not wait to see if the symptoms will go away. Do not drive yourself to the hospital. Get help right away if you feel like you may hurt yourself or others, or have thoughts about taking your own life. Go to your nearest emergency room or: Call your local emergency services (911 in the [place] Call the National Poison Control Center at 1-800-222-1222. Call a suicide crisis helpline, such as the National Suicide Prevention Lifeline at 1-800-273-8255 or 988 in the [place] This is open 24 hours a day. Text the Crisis Text Line at 741741. Summary Opioid are strong medicines that are used to treat bad or very bad pain. A pain treatment plan is a plan made by you and your doctor. Work with your doctor to make a   plan for treating your pain. If you think that you or someone else may have taken too much of an opioid, get help right away. This information is not intended to replace advice given to you by your health care provider. Make sure you discuss any questions you have with your health care provider. Document Revised: 10/02/2020 Document Reviewed: 06/19/2020 Elsevier Patient Education  2024 Elsevier Inc.  

## 2023-02-04 NOTE — Progress Notes (Addendum)
Subjective:    Patient ID: [name], female    DOB: 1940/04/02, 82 y.o.   MRN: [id number]   Chief Complaint: medical management of chronic issues     HPI:  [name] is a 82 y.o. who identifies as a female who was assigned female at birth.   Social history: Lives with: by herself Work history: rteired   Comes in today for follow up of the following chronic medical issues:  1. Essential hypertension No c/o chest pain, sob or headache. Doe snot check blood pressure at home. BP Readings from Last 3 Encounters:  01/04/23 (!) 156/89  08/11/22 (!) 158/86  04/30/22 (!) 140/86     2. Hyperlipidemia associated with type 2 diabetes mellitus (HCC) Does not watch diet and does no dedicated exercise Lab Results  Component Value Date   CHOL 137 08/11/2022   HDL 42 08/11/2022   LDLCALC 68 08/11/2022   TRIG 156 (H) 08/11/2022   CHOLHDL 3.3 08/11/2022     3. Recurrent major depressive disorder, in full remission (HCC) Is on combination of lexapro and buspar. Is doing well.    02/04/2023    2:08 PM 01/04/2023    2:01 PM 01/04/2023    1:46 PM  Depression screen PHQ 2/9  Decreased Interest 0 1 0  Down, Depressed, Hopeless 1 1 0  PHQ - 2 Score 1 2 0  Altered sleeping 0 1   Tired, decreased energy 1 1   Change in appetite 0 1   Feeling bad or failure about yourself  0 0   Trouble concentrating 0 0   Moving slowly or fidgety/restless 0 0   Suicidal thoughts 0 0   PHQ-9 Score 2 5   Difficult doing work/chores Not difficult at all Somewhat difficult      4. Fibromyalgia Pain assessment: Cause of pain- fibromyalgia Pain location- varies from day to day Pain on scale of 1-10- 8-9/10- has ran out of pain meds Frequency- daily What increases pain-to much activity What makes pain Better-rest Effects on ADL - none Any change in general medical condition-no  Current opioids rx- oxycodone 5mg  BID # meds rx- 60 Effectiveness of current meds-helps Adverse reactions  from pain meds-none Morphine equivalent- 15 MME  Pill count performed-No Last drug screen - 04/10/21 ( high risk q7m, moderate risk q18m, low risk yearly ) Urine drug screen today- No Was the [facility] reviewed- yes  If yes were their any concerning findings? - no   Overdose risk: 1   Pain contract signed on:02/04/23   5. Diet-controlled diabetes mellitus (HCC) Fasting blood sugars are running around. Lab Results  Component Value Date   HGBA1C 7.5 (H) 08/11/2022     6. ULCERATIVE COLITIS, LEFT SIDED Has chronic pain and often diarrhea  7. Stage 3b chronic kidney disease (HCC) No voiding issues Lab Results  Component Value Date   CREATININE 1.11 (H) 08/11/2022     8. Bradycardia Denies syncopal or near syncopal episodes. Pulse Readings from Last 3 Encounters:  01/04/23 93  08/11/22 90  04/30/22 93     9. Primary insomnia Is on trazadone and is doing well    New complaints: None today  Allergies  Allergen Reactions   Azathioprine Nausea And Vomiting and Other (See Comments)    SEVERE NAUSEA AND VOMITING WITH CHEST PAIN-  PATIENT INSISTS NEVER TO BE GIVEN ANYTHING SIMILAR TO THIS   Tape Other (See Comments) and Rash    BLISTERS AND SKIN TEARING   Codeine  Nausea And Vomiting   Milk (Cow) Nausea And Vomiting   Penicillins Hives    Has patient had a PCN reaction causing immediate rash, facial/tongue/throat swelling, SOB or lightheadedness with hypotension: No Has patient had a PCN reaction causing severe rash involving mucus membranes or skin necrosis: No Has patient had a PCN reaction that required hospitalization: No Has patient had a PCN reaction occurring within the last 10 years: No If all of the above answers are "NO", then may proceed with Cephalosporin use.    Povidone-Iodine Hives    BETADINE   Procaine Hcl Other (See Comments)    SEVERE GI UPSET (NAUSEA,VOMITING AND DIARRHEA)   Sulfonamide Derivatives Hives   Tramadol Itching   Acyclovir And  Related    Azithromycin     Mycins    Clonazepam Other (See Comments)    Skin burning, insomnia, diarrhea.   Dairy Aid [Tilactase]    Doxycycline Rash   Latex Rash   Niacin And Related Rash   Outpatient Encounter Medications as of 02/04/2023  Medication Sig   amLODipine (NORVASC) 2.5 MG tablet TAKE 1 TABLET ONCE DAILY.   antiseptic oral rinse (BIOTENE) LIQD 1 application by Mouth Rinse route daily. FOR DRY MOUTH   benazepril-hydrochlorthiazide (LOTENSIN HCT) 20-12.5 MG tablet Take 2 tablets by mouth daily.   Blood Glucose Monitoring Suppl (ONETOUCH VERIO FLEX SYSTEM) w/Device KIT USE AS DIRECTED WITH TESTING SUPPLIES UP TO 4 TIMES A DAY   busPIRone (BUSPAR) 10 MG tablet Take 1 tablet (10 mg total) by mouth 3 (three) times daily. **NEEDS TO BE SEEN BEFORE NEXT REFILL**   co-enzyme Q-10 50 MG capsule Take 50 mg by mouth once a week.   dicyclomine (BENTYL) 20 MG tablet TAKE ONE-HALF TO 1 TABLET DAILY   escitalopram (LEXAPRO) 20 MG tablet Take 1 tablet (20 mg total) by mouth daily.   glucose blood (ONETOUCH VERIO) test strip Test BS up to 4 times daily or as directed Dx E11.22   hydrocortisone (ANUSOL-HC) 2.5 % rectal cream Place rectally 2 (two) times daily.   JANUVIA 100 MG tablet Take 1 tablet (100 mg total) by mouth daily.   Lancets (ONETOUCH DELICA PLUS LANCET33G) MISC CHECK BLOOD SUGAR UP TO 4 TIMES A DAY OR AS DIRECTED   Loperamide HCl (IMODIUM PO) Take 1 tablet by mouth daily as needed (diarrhea).   methocarbamol (ROBAXIN) 500 MG tablet TAKE 1 TABLET EVERY 6 HOURS AS NEEDED FOR MUSCLE SPASMS   Multiple Vitamins-Minerals (WOMENS MULTI VITAMIN & MINERAL PO) Take 1 tablet by mouth every other day.   Omega-3 Fatty Acids (FISH OIL) 1000 MG CPDR Take 3 capsules by mouth daily.    ondansetron (ZOFRAN) 4 MG tablet TAKE ONE TABLET EVERY 8 HOURS AS NEEDED FOR NAUSEA AND VOMITING   oxyCODONE (OXY IR/ROXICODONE) 5 MG immediate release tablet Take 1 tablet (5 mg total) by mouth in the morning and  at bedtime. TAKE (1) TABLET TWICE DAILY.   oxyCODONE (OXY IR/ROXICODONE) 5 MG immediate release tablet Take 1 tablet (5 mg total) by mouth in the morning and at bedtime. TAKE (1) TABLET TWICE DAILY.   oxyCODONE (OXY IR/ROXICODONE) 5 MG immediate release tablet Take 1 tablet (5 mg total) by mouth in the morning and at bedtime. TAKE (1) TABLET TWICE DAILY.   polyethylene glycol (MIRALAX / GLYCOLAX) 17 g packet Take 1 packet daily with 8 ounces of water as needed for constipation.   Probiotic Product (PROBIOTIC FORMULA PO) Take 1-2 capsules by mouth every morning.  shark liver oil-cocoa butter (PREPARATION H) 0.25-3-85.5 % suppository Place 1 suppository rectally as needed for hemorrhoids.   traZODone (DESYREL) 150 MG tablet TAKE 1/2 TO 1 TABLET BY MOUTH AT BEDTIME AS NEEDED FOR SLEEP.   vitamin E 400 UNIT capsule Take 400 Units by mouth every morning.   [DISCONTINUED] sodium chloride (OCEAN) 0.65 % SOLN nasal spray Place 2 sprays into the nose as needed for congestion.   No facility-administered encounter medications on file as of 02/04/2023.    Past Surgical History:  Procedure Laterality Date   ABDOMINAL HYSTERECTOMY     BIOPSY N/A 12/28/2014   Procedure: SIGMOID AND RECTAL BIOPSIES;  Surgeon: Malissa Hippo, MD;  Location: [facility];  Service: Endoscopy;  Laterality: N/A;   CHOLECYSTECTOMY     s/p   COLONOSCOPY  06/16/06. 04/27/2007   proctitis seen, biopsies showed chronic active colitis, no dysplasia    EYE SURGERY     clear lense extraction 1998    FLEXIBLE SIGMOIDOSCOPY N/A 12/28/2014   Procedure: FLEXIBLE SIGMOIDOSCOPY WITH PROPOFOL;  Surgeon: Malissa Hippo, MD;  Location: [facility];  Service: Endoscopy;  Laterality: N/A;    Family History  Problem Relation Age of Onset   Heart attack Mother 87       Died in her sleep   Diabetes Mother    Heart attack Father 58       Died in her sleep   Cancer Sister        liver, breast    Heart disease Sister    Breast cancer Maternal Aunt     Breast cancer Maternal Aunt    Heart attack Brother 97       Defib, CABG   Diabetes Brother    Kidney disease Brother        dyalasis         Review of Systems  Constitutional:  Negative for diaphoresis.  Eyes:  Negative for pain.  Respiratory:  Negative for shortness of breath.   Cardiovascular:  Negative for chest pain, palpitations and leg swelling.  Gastrointestinal:  Negative for abdominal pain.  Endocrine: Negative for polydipsia.  Musculoskeletal:  Positive for myalgias.  Skin:  Negative for rash.  Neurological:  Negative for dizziness, weakness and headaches.  Hematological:  Does not bruise/bleed easily.  All other systems reviewed and are negative.      Objective:   Physical Exam Vitals and nursing note reviewed.  Constitutional:      General: She is not in acute distress.    Appearance: Normal appearance. She is well-developed.  HENT:     Head: Normocephalic.     Right Ear: Tympanic membrane normal.     Left Ear: Tympanic membrane normal.     Nose: Nose normal.     Mouth/Throat:     Mouth: Mucous membranes are moist.  Eyes:     Pupils: Pupils are equal, round, and reactive to light.  Neck:     Vascular: No carotid bruit or JVD.  Cardiovascular:     Rate and Rhythm: Normal rate and regular rhythm.     Heart sounds: Normal heart sounds.  Pulmonary:     Effort: Pulmonary effort is normal. No respiratory distress.     Breath sounds: Normal breath sounds. No wheezing or rales.  Chest:     Chest wall: No tenderness.  Abdominal:     General: Bowel sounds are normal. There is no distension or abdominal bruit.     Palpations: Abdomen is soft. There  is no hepatomegaly, splenomegaly, mass or pulsatile mass.     Tenderness: There is no abdominal tenderness.  Musculoskeletal:        General: Normal range of motion.     Cervical back: Normal range of motion and neck supple.  Lymphadenopathy:     Cervical: No cervical adenopathy.  Skin:    General: Skin is  warm and dry.  Neurological:     Mental Status: She is alert and oriented to person, place, and time.     Deep Tendon Reflexes: Reflexes are normal and symmetric.  Psychiatric:        Behavior: Behavior normal.        Thought Content: Thought content normal.        Judgment: Judgment normal.     BP (!) 169/84   Pulse 86   Temp 97.8 F (36.6 C) (Temporal)   Ht 5\' 9"  (1.753 m)   Wt 161 lb (73 kg)   SpO2 95%   BMI 23.78 kg/m    HGBA1c 7.3%     Assessment & Plan:   Ryelynn Porten comes in today with chief complaint of Medical Management of Chronic Issues   Diagnosis and orders addressed:  1. Essential hypertension Low sodium diet Increase amlodipine to 5mg  daily - CBC with Differential/Platelet - CMP14+EGFR - amLODipine (NORVASC) 5 MG tablet; Take 1 tablet (5 mg total) by mouth daily.  Dispense: 90 tablet; Refill: 1  2. Hyperlipidemia associated with type 2 diabetes mellitus (HCC) Low fat diet - Lipid panel  3. Recurrent major depressive disorder, in full remission (HCC) Stress management - ToxASSURE Select 13 (MW), Urine - busPIRone (BUSPAR) 10 MG tablet; Take 1 tablet (10 mg total) by mouth 3 (three) times daily. **NEEDS TO BE SEEN BEFORE NEXT REFILL**  Dispense: 90 tablet; Refill: 0 - escitalopram (LEXAPRO) 20 MG tablet; Take 1 tablet (20 mg total) by mouth daily.  Dispense: 30 tablet; Refill: 2  4. Fibromyalgia Exercise to keep muscles warm - oxyCODONE (OXY IR/ROXICODONE) 5 MG immediate release tablet; Take 1 tablet (5 mg total) by mouth in the morning and at bedtime. TAKE (1) TABLET TWICE DAILY.  Dispense: 60 tablet; Refill: 0 - oxyCODONE (OXY IR/ROXICODONE) 5 MG immediate release tablet; Take 1 tablet (5 mg total) by mouth in the morning and at bedtime. TAKE (1) TABLET TWICE DAILY.  Dispense: 60 tablet; Refill: 0 - oxyCODONE (OXY IR/ROXICODONE) 5 MG immediate release tablet; Take 1 tablet (5 mg total) by mouth in the morning and at bedtime. TAKE (1) TABLET TWICE  DAILY.  Dispense: 60 tablet; Refill: 0  5. Diet-controlled diabetes mellitus (HCC) Continue to watch carb sin diet - Bayer DCA Hb A1c Waived - JANUVIA 100 MG tablet; Take 1 tablet (100 mg total) by mouth daily.  Dispense: 90 tablet; Refill: 1  6. ULCERATIVE COLITIS, LEFT SIDED - dicyclomine (BENTYL) 20 MG tablet; TAKE ONE-HALF TO 1 TABLET DAILY  Dispense: 30 tablet; Refill: 5  7. Stage 3b chronic kidney disease (HCC) Lab Results  Component Value Date   CREATININE 1.11 (H) 08/11/2022     8. Bradycardia Report any syncopal episodes  9. Primary insomnia Bedtime routine - traZODone (DESYREL) 150 MG tablet; TAKE 1/2 TO 1 TABLET BY MOUTH AT BEDTIME AS NEEDED FOR SLEEP.  Dispense: 30 tablet; Refill: 2    Labs pending Health Maintenance reviewed Diet and exercise encouraged  Follow up plan: 3 months    Mary-Margaret Daphine Deutscher, FNP

## 2023-02-05 ENCOUNTER — Other Ambulatory Visit: Payer: Self-pay | Admitting: Nurse Practitioner

## 2023-02-05 DIAGNOSIS — M797 Fibromyalgia: Secondary | ICD-10-CM

## 2023-02-05 LAB — CMP14+EGFR
ALT: 11 [IU]/L (ref 0–32)
AST: 17 [IU]/L (ref 0–40)
Albumin: 4.3 g/dL (ref 3.7–4.7)
Alkaline Phosphatase: 78 [IU]/L (ref 44–121)
BUN/Creatinine Ratio: 11 — ABNORMAL LOW (ref 12–28)
BUN: 13 mg/dL (ref 8–27)
Bilirubin Total: 0.6 mg/dL (ref 0.0–1.2)
CO2: 22 mmol/L (ref 20–29)
Calcium: 9.3 mg/dL (ref 8.7–10.3)
Chloride: 89 mmol/L — ABNORMAL LOW (ref 96–106)
Creatinine, Ser: 1.15 mg/dL — ABNORMAL HIGH (ref 0.57–1.00)
Globulin, Total: 2.3 g/dL (ref 1.5–4.5)
Glucose: 198 mg/dL — ABNORMAL HIGH (ref 70–99)
Potassium: 3.6 mmol/L (ref 3.5–5.2)
Sodium: 128 mmol/L — ABNORMAL LOW (ref 134–144)
Total Protein: 6.6 g/dL (ref 6.0–8.5)
eGFR: 48 mL/min/{1.73_m2} — ABNORMAL LOW (ref >59)

## 2023-02-05 LAB — CBC WITH DIFFERENTIAL/PLATELET
Basophils Absolute: 0 10*3/uL (ref 0.0–0.2)
Basos: 1 %
EOS (ABSOLUTE): 0 10*3/uL (ref 0.0–0.4)
Eos: 1 %
Hematocrit: 40 % (ref 34.0–46.6)
Hemoglobin: 12.8 g/dL (ref 11.1–15.9)
Immature Grans (Abs): 0 10*3/uL (ref 0.0–0.1)
Immature Granulocytes: 0 %
Lymphocytes Absolute: 1.3 10*3/uL (ref 0.7–3.1)
Lymphs: 20 %
MCH: 29.9 pg (ref 26.6–33.0)
MCHC: 32 g/dL (ref 31.5–35.7)
MCV: 94 fL (ref 79–97)
Monocytes Absolute: 0.4 10*3/uL (ref 0.1–0.9)
Monocytes: 6 %
Neutrophils Absolute: 4.8 10*3/uL (ref 1.4–7.0)
Neutrophils: 72 %
Platelets: 286 10*3/uL (ref 150–450)
RBC: 4.28 x10E6/uL (ref 3.77–5.28)
RDW: 12.7 % (ref 11.7–15.4)
WBC: 6.6 10*3/uL (ref 3.4–10.8)

## 2023-02-05 LAB — LIPID PANEL
Chol/HDL Ratio: 5.3 ratio — ABNORMAL HIGH (ref 0.0–4.4)
Cholesterol, Total: 201 mg/dL — ABNORMAL HIGH (ref 100–199)
HDL: 38 mg/dL — ABNORMAL LOW (ref >39)
LDL Chol Calc (NIH): 120 mg/dL — ABNORMAL HIGH (ref 0–99)
Triglycerides: 243 mg/dL — ABNORMAL HIGH (ref 0–149)
VLDL Cholesterol Cal: 43 mg/dL — ABNORMAL HIGH (ref 5–40)

## 2023-02-05 MED ORDER — ROSUVASTATIN CALCIUM 10 MG PO TABS: 10.0000 mg | ORAL_TABLET | Freq: Every day | ORAL | 1 refills | Status: DC

## 2023-02-05 NOTE — Telephone Encounter (Signed)
Refilled 02/04/23, requires denial by PCP

## 2023-02-05 NOTE — Addendum Note (Signed)
Addended by: Bennie Pierini on: 02/05/2023 04:12 PM   Modules accepted: Orders

## 2023-02-09 LAB — TOXASSURE SELECT 13 (MW), URINE

## 2023-02-25 ENCOUNTER — Other Ambulatory Visit: Payer: Self-pay | Admitting: Nurse Practitioner

## 2023-02-25 DIAGNOSIS — I10 Essential (primary) hypertension: Secondary | ICD-10-CM

## 2023-02-28 ENCOUNTER — Other Ambulatory Visit: Payer: Self-pay | Admitting: Nurse Practitioner

## 2023-02-28 DIAGNOSIS — I10 Essential (primary) hypertension: Secondary | ICD-10-CM

## 2023-03-05 ENCOUNTER — Other Ambulatory Visit: Payer: Self-pay | Admitting: Nurse Practitioner

## 2023-03-05 DIAGNOSIS — F3342 Major depressive disorder, recurrent, in full remission: Secondary | ICD-10-CM

## 2023-03-05 DIAGNOSIS — M797 Fibromyalgia: Secondary | ICD-10-CM

## 2023-03-05 NOTE — Telephone Encounter (Unsigned)
Copied from CRM [id number]. Topic: Clinical - Medication Refill >> Mar 05, 2023 11:41 AM Dimitri Ped wrote: Most Recent Primary Care Visit:  Provider: Bennie Pierini  Department: [facility]  Visit Type: OFFICE VISIT  Date: 02/04/2023  Medication: ***  Has the patient contacted their pharmacy? {yes/no:20286} (Agent: If no, request that the patient contact the pharmacy for the refill. If patient does not wish to contact the pharmacy document the reason why and proceed with request.) (Agent: If yes, when and what did the pharmacy advise?)  Is this the correct pharmacy for this prescription? {yes/no:20286} If no, delete pharmacy and type the correct one.  This is the patient's preferred pharmacy:  [facility] [place], [place] - [address] [place] [address] Phone: [phone] Fax: [phone]  [facility] - [place], [place] - [address] [place] [address] Phone: [phone] Fax: [phone]   Has the prescription been filled recently? {yes/no:20286}  Is the patient out of the medication? {yes/no:20286}  Has the patient been seen for an appointment in the last year OR does the patient have an upcoming appointment? {yes/no:20286}  Can we respond through MyChart? {yes/no:20286}  Agent: Please be advised that Rx refills may take up to 3 business days. We ask that you follow-up with your pharmacy.

## 2023-03-12 ENCOUNTER — Other Ambulatory Visit: Payer: Self-pay | Admitting: Family

## 2023-03-12 ENCOUNTER — Other Ambulatory Visit: Payer: Self-pay | Admitting: Nurse Practitioner

## 2023-03-12 DIAGNOSIS — M797 Fibromyalgia: Secondary | ICD-10-CM

## 2023-03-12 DIAGNOSIS — K5150 Left sided colitis without complications: Secondary | ICD-10-CM

## 2023-04-01 ENCOUNTER — Telehealth: Payer: Self-pay | Admitting: Nurse Practitioner

## 2023-04-01 NOTE — Telephone Encounter (Unsigned)
 Copied from CRM [id number]. Topic: Medical Record Request - Payor/Billing Request >> Apr 01, 2023 11:22 AM Graeme ORN wrote: Reason for CRM: Corean called from [facility] to discuss patient information. Caller states looks like patient is due for bone density scan. Would like to know if there were any reports of fractures and when the last scan was. Call back number: [phone]  Thank You

## 2023-04-13 ENCOUNTER — Other Ambulatory Visit: Payer: Self-pay | Admitting: Nurse Practitioner

## 2023-04-13 NOTE — Progress Notes (Signed)
Pharmacy sent message to prescribe 90 day suply of [place]- prescription was written for 90 day supply on 02/04/23.  Mary-Margaret Daphine Deutscher, FNP

## 2023-04-15 ENCOUNTER — Other Ambulatory Visit: Payer: Self-pay | Admitting: Nurse Practitioner

## 2023-04-15 DIAGNOSIS — M797 Fibromyalgia: Secondary | ICD-10-CM

## 2023-04-28 ENCOUNTER — Other Ambulatory Visit: Payer: Self-pay | Admitting: Nurse Practitioner

## 2023-04-28 DIAGNOSIS — F3342 Major depressive disorder, recurrent, in full remission: Secondary | ICD-10-CM

## 2023-05-04 ENCOUNTER — Ambulatory Visit (INDEPENDENT_AMBULATORY_CARE_PROVIDER_SITE_OTHER): Payer: Medicare Other | Admitting: Nurse Practitioner

## 2023-05-04 ENCOUNTER — Encounter: Payer: Self-pay | Admitting: Nurse Practitioner

## 2023-05-04 VITALS — BP 119/77 | HR 73 | Temp 97.6°F | Ht 69.0 in | Wt 165.0 lb

## 2023-05-04 DIAGNOSIS — E1169 Type 2 diabetes mellitus with other specified complication: Secondary | ICD-10-CM | POA: Diagnosis not present

## 2023-05-04 DIAGNOSIS — F3342 Major depressive disorder, recurrent, in full remission: Secondary | ICD-10-CM | POA: Diagnosis not present

## 2023-05-04 DIAGNOSIS — F5101 Primary insomnia: Secondary | ICD-10-CM

## 2023-05-04 DIAGNOSIS — N1832 Chronic kidney disease, stage 3b: Secondary | ICD-10-CM

## 2023-05-04 DIAGNOSIS — M797 Fibromyalgia: Secondary | ICD-10-CM | POA: Diagnosis not present

## 2023-05-04 DIAGNOSIS — E785 Hyperlipidemia, unspecified: Secondary | ICD-10-CM

## 2023-05-04 DIAGNOSIS — I10 Essential (primary) hypertension: Secondary | ICD-10-CM | POA: Diagnosis not present

## 2023-05-04 DIAGNOSIS — Z7984 Long term (current) use of oral hypoglycemic drugs: Secondary | ICD-10-CM

## 2023-05-04 DIAGNOSIS — K5150 Left sided colitis without complications: Secondary | ICD-10-CM

## 2023-05-04 DIAGNOSIS — R001 Bradycardia, unspecified: Secondary | ICD-10-CM

## 2023-05-04 DIAGNOSIS — E119 Type 2 diabetes mellitus without complications: Secondary | ICD-10-CM

## 2023-05-04 LAB — BAYER DCA HB A1C WAIVED: HB A1C (BAYER DCA - WAIVED): 7.8 % — ABNORMAL HIGH (ref 4.8–5.6)

## 2023-05-04 LAB — LIPID PANEL

## 2023-05-04 MED ORDER — ESCITALOPRAM OXALATE 20 MG PO TABS: 20.0000 mg | ORAL_TABLET | Freq: Every day | ORAL | 1 refills | Status: DC

## 2023-05-04 MED ORDER — BUSPIRONE HCL 10 MG PO TABS: 10.0000 mg | ORAL_TABLET | Freq: Three times a day (TID) | ORAL | 1 refills | Status: DC

## 2023-05-04 MED ORDER — OXYCODONE HCL 5 MG PO TABS: 5.0000 mg | ORAL_TABLET | Freq: Two times a day (BID) | ORAL | 0 refills | Status: DC

## 2023-05-04 MED ORDER — TRAZODONE HCL 150 MG PO TABS: ORAL_TABLET | ORAL | 2 refills | Status: DC

## 2023-05-04 NOTE — Progress Notes (Signed)
Subjective:    Patient ID: [name], female    DOB: January 17, 1941, 83 y.o.   MRN: [id number]   Chief Complaint: medical management of chronic issues     HPI:  [name] is a 83 y.o. who identifies as a female who was assigned female at birth.   Social history: Lives with: by herself Work history: rteired   Comes in today for follow up of the following chronic medical issues:  1. Essential hypertension No c/o chest pain, sob or headache. Doe snot check blood pressure at home. BP Readings from Last 3 Encounters:  02/04/23 (!) 169/84  01/04/23 (!) 156/89  08/11/22 (!) 158/86     2. Hyperlipidemia associated with type 2 diabetes mellitus (HCC) Does not watch diet and does no dedicated exercise Lab Results  Component Value Date   CHOL 201 (H) 02/04/2023   HDL 38 (L) 02/04/2023   LDLCALC 120 (H) 02/04/2023   TRIG 243 (H) 02/04/2023   CHOLHDL 5.3 (H) 02/04/2023     3. Recurrent major depressive disorder, in full remission (HCC) Is on combination of lexapro and buspar. Is doing well.    05/04/2023    2:26 PM 02/04/2023    2:08 PM 01/04/2023    2:01 PM  Depression screen PHQ 2/9  Decreased Interest 0 0 1  Down, Depressed, Hopeless 1 1 1   PHQ - 2 Score 1 1 2   Altered sleeping 1 0 1  Tired, decreased energy 1 1 1   Change in appetite 0 0 1  Feeling bad or failure about yourself  0 0 0  Trouble concentrating 0 0 0  Moving slowly or fidgety/restless 0 0 0  Suicidal thoughts 0 0 0  PHQ-9 Score 3 2 5   Difficult doing work/chores Not difficult at all Not difficult at all Somewhat difficult      05/04/2023    2:27 PM 01/04/2023    2:01 PM 08/11/2022    2:24 PM 04/30/2022    3:20 PM  GAD 7 : Generalized Anxiety Score  Nervous, Anxious, on Edge 1 1 2 1   Control/stop worrying 1 0 3 2  Worry too much - different things 1 1 3 1   Trouble relaxing 1 1 3 2   Restless 0 0 3 1  Easily annoyed or irritable 0 0 3 0  Afraid - awful might happen 1 0 3 0  Total GAD 7 Score 5  3 20 7   Anxiety Difficulty Not difficult at all Somewhat difficult Somewhat difficult Somewhat difficult       4. Fibromyalgia Pain assessment: Cause of pain- fibromyalgia Pain location- varies from day to day Pain on scale of 1-10- 8-9/10- has ran out of pain meds Frequency- daily What increases pain-to much activity What makes pain Better-rest Effects on ADL - none Any change in general medical condition-no  Current opioids rx- oxycodone 5mg  BID # meds rx- 60 Effectiveness of current meds-helps Adverse reactions from pain meds-none Morphine equivalent- 15 MME  Pill count performed-No Last drug screen - 04/10/21 ( high risk q93m, moderate risk q70m, low risk yearly ) Urine drug screen today- No Was the [facility] reviewed- yes  If yes were their any concerning findings? - no   Overdose risk: 1   Pain contract signed on:02/04/23   5. Diet-controlled diabetes mellitus (HCC) Fasting blood sugars are running around. Lab Results  Component Value Date   HGBA1C 7.3 (H) 02/04/2023     6. ULCERATIVE COLITIS, LEFT SIDED Has chronic pain and often  diarrhea  7. Stage 3b chronic kidney disease (HCC) No voiding issues Lab Results  Component Value Date   CREATININE 1.15 (H) 02/04/2023     8. Bradycardia Denies syncopal or near syncopal episodes. Pulse Readings from Last 3 Encounters:  02/04/23 86  01/04/23 93  08/11/22 90     9. Primary insomnia Is on trazadone and is doing well    New complaints: None today  Allergies  Allergen Reactions   Azathioprine Nausea And Vomiting and Other (See Comments)    SEVERE NAUSEA AND VOMITING WITH CHEST PAIN-  PATIENT INSISTS NEVER TO BE GIVEN ANYTHING SIMILAR TO THIS   Tape Other (See Comments) and Rash    BLISTERS AND SKIN TEARING   Codeine Nausea And Vomiting   Milk (Cow) Nausea And Vomiting   Penicillins Hives    Has patient had a PCN reaction causing immediate rash, facial/tongue/throat swelling, SOB or  lightheadedness with hypotension: No Has patient had a PCN reaction causing severe rash involving mucus membranes or skin necrosis: No Has patient had a PCN reaction that required hospitalization: No Has patient had a PCN reaction occurring within the last 10 years: No If all of the above answers are "NO", then may proceed with Cephalosporin use.    Povidone-Iodine Hives    BETADINE   Procaine Hcl Other (See Comments)    SEVERE GI UPSET (NAUSEA,VOMITING AND DIARRHEA)   Sulfonamide Derivatives Hives   Tramadol Itching   Acyclovir And Related    Azithromycin     Mycins    Clonazepam Other (See Comments)    Skin burning, insomnia, diarrhea.   Dairy Aid [Tilactase]    Doxycycline Rash   Latex Rash   Niacin And Related Rash   Outpatient Encounter Medications as of 05/04/2023  Medication Sig   amLODipine (NORVASC) 5 MG tablet Take 1 tablet (5 mg total) by mouth daily.   antiseptic oral rinse (BIOTENE) LIQD 1 application by Mouth Rinse route daily. FOR DRY MOUTH   benazepril-hydrochlorthiazide (LOTENSIN HCT) 20-12.5 MG tablet TAKE TWO TABLETS BY MOUTH DAILY   Blood Glucose Monitoring Suppl (ONETOUCH VERIO FLEX SYSTEM) w/Device KIT USE AS DIRECTED WITH TESTING SUPPLIES UP TO 4 TIMES A DAY   busPIRone (BUSPAR) 10 MG tablet Take 1 tablet (10 mg total) by mouth 3 (three) times daily.   co-enzyme Q-10 50 MG capsule Take 50 mg by mouth once a week.   dicyclomine (BENTYL) 20 MG tablet TAKE ONE-HALF TO 1 TABLET DAILY   escitalopram (LEXAPRO) 20 MG tablet TAKE ONE TABLET BY MOUTH DAILY   glucose blood (ONETOUCH VERIO) test strip Test BS up to 4 times daily or as directed Dx E11.22   hydrocortisone (ANUSOL-HC) 2.5 % rectal cream Place rectally 2 (two) times daily.   JANUVIA 100 MG tablet Take 1 tablet (100 mg total) by mouth daily.   Lancets (ONETOUCH DELICA PLUS LANCET33G) MISC CHECK BLOOD SUGAR UP TO 4 TIMES A DAY OR AS DIRECTED   Loperamide HCl (IMODIUM PO) Take 1 tablet by mouth daily as needed  (diarrhea).   methocarbamol (ROBAXIN) 500 MG tablet TAKE ONE TABLET EVERY 6 HOURS AS NEEDED FOR MUSCLE SPASMS   Multiple Vitamins-Minerals (WOMENS MULTI VITAMIN & MINERAL PO) Take 1 tablet by mouth every other day.   Omega-3 Fatty Acids (FISH OIL) 1000 MG CPDR Take 3 capsules by mouth daily.  (Patient not taking: Reported on 02/04/2023)   ondansetron (ZOFRAN) 4 MG tablet TAKE ONE TABLET EVERY 8 HOURS AS NEEDED FOR NAUSEA AND  VOMITING   oxyCODONE (OXY IR/ROXICODONE) 5 MG immediate release tablet Take 1 tablet (5 mg total) by mouth in the morning and at bedtime. TAKE (1) TABLET TWICE DAILY.   oxyCODONE (OXY IR/ROXICODONE) 5 MG immediate release tablet Take 1 tablet (5 mg total) by mouth in the morning and at bedtime. TAKE (1) TABLET TWICE DAILY.   oxyCODONE (OXY IR/ROXICODONE) 5 MG immediate release tablet Take 1 tablet (5 mg total) by mouth in the morning and at bedtime. TAKE (1) TABLET TWICE DAILY.   polyethylene glycol (MIRALAX / GLYCOLAX) 17 g packet Take 1 packet daily with 8 ounces of water as needed for constipation.   Probiotic Product (PROBIOTIC FORMULA PO) Take 1-2 capsules by mouth every morning.   rosuvastatin (CRESTOR) 10 MG tablet Take 1 tablet (10 mg total) by mouth daily.   shark liver oil-cocoa butter (PREPARATION H) 0.25-3-85.5 % suppository Place 1 suppository rectally as needed for hemorrhoids.   traZODone (DESYREL) 150 MG tablet TAKE 1/2 TO 1 TABLET BY MOUTH AT BEDTIME AS NEEDED FOR SLEEP.   vitamin E 400 UNIT capsule Take 400 Units by mouth every morning.   [DISCONTINUED] sodium chloride (OCEAN) 0.65 % SOLN nasal spray Place 2 sprays into the nose as needed for congestion.   No facility-administered encounter medications on file as of 05/04/2023.    Past Surgical History:  Procedure Laterality Date   ABDOMINAL HYSTERECTOMY     BIOPSY N/A 12/28/2014   Procedure: SIGMOID AND RECTAL BIOPSIES;  Surgeon: Malissa Hippo, MD;  Location: [facility];  Service: Endoscopy;  Laterality:  N/A;   CHOLECYSTECTOMY     s/p   COLONOSCOPY  06/16/06. 04/27/2007   proctitis seen, biopsies showed chronic active colitis, no dysplasia    EYE SURGERY     clear lense extraction 1998    FLEXIBLE SIGMOIDOSCOPY N/A 12/28/2014   Procedure: FLEXIBLE SIGMOIDOSCOPY WITH PROPOFOL;  Surgeon: Malissa Hippo, MD;  Location: [facility];  Service: Endoscopy;  Laterality: N/A;    Family History  Problem Relation Age of Onset   Heart attack Mother 65       Died in her sleep   Diabetes Mother    Heart attack Father 64       Died in her sleep   Cancer Sister        liver, breast    Heart disease Sister    Breast cancer Maternal Aunt    Breast cancer Maternal Aunt    Heart attack Brother 45       Defib, CABG   Diabetes Brother    Kidney disease Brother        dyalasis         Review of Systems  Constitutional:  Negative for diaphoresis.  Eyes:  Negative for pain.  Respiratory:  Negative for shortness of breath.   Cardiovascular:  Negative for chest pain, palpitations and leg swelling.  Gastrointestinal:  Negative for abdominal pain.  Endocrine: Negative for polydipsia.  Musculoskeletal:  Positive for myalgias.  Skin:  Negative for rash.  Neurological:  Negative for dizziness, weakness and headaches.  Hematological:  Does not bruise/bleed easily.  All other systems reviewed and are negative.      Objective:   Physical Exam Vitals and nursing note reviewed.  Constitutional:      General: She is not in acute distress.    Appearance: Normal appearance. She is well-developed.  HENT:     Head: Normocephalic.     Right Ear: Tympanic membrane normal.  Left Ear: Tympanic membrane normal.     Nose: Nose normal.     Mouth/Throat:     Mouth: Mucous membranes are moist.  Eyes:     Pupils: Pupils are equal, round, and reactive to light.  Neck:     Vascular: No carotid bruit or JVD.  Cardiovascular:     Rate and Rhythm: Normal rate and regular rhythm.     Heart sounds: Normal  heart sounds.  Pulmonary:     Effort: Pulmonary effort is normal. No respiratory distress.     Breath sounds: Normal breath sounds. No wheezing or rales.  Chest:     Chest wall: No tenderness.  Abdominal:     General: Bowel sounds are normal. There is no distension or abdominal bruit.     Palpations: Abdomen is soft. There is no hepatomegaly, splenomegaly, mass or pulsatile mass.     Tenderness: There is no abdominal tenderness.  Musculoskeletal:        General: Normal range of motion.     Cervical back: Normal range of motion and neck supple.  Lymphadenopathy:     Cervical: No cervical adenopathy.  Skin:    General: Skin is warm and dry.  Neurological:     Mental Status: She is alert and oriented to person, place, and time.     Deep Tendon Reflexes: Reflexes are normal and symmetric.  Psychiatric:        Behavior: Behavior normal.        Thought Content: Thought content normal.        Judgment: Judgment normal.     BP 119/77   Pulse 73   Temp 97.6 F (36.4 C) (Temporal)   Ht 5\' 9"  (1.753 m)   Wt 165 lb (74.8 kg)   SpO2 95%   BMI 24.37 kg/m     HGBA1c 7.8%      Assessment & Plan:  [name] comes in today with chief complaint of Medical Management of Chronic Issues   Diagnosis and orders addressed:  1. Essential hypertension (Primary) Low sodium diet - CBC with Differential/Platelet - CMP14+EGFR  2. Hyperlipidemia associated with type 2 diabetes mellitus (HCC) Low fat diet - Lipid panel  3. Diet-controlled diabetes mellitus (HCC) Watch cabs in diet- stricter - Bayer DCA Hb A1c Waived  4. Recurrent major depressive disorder, in full remission (HCC) Stress management - busPIRone (BUSPAR) 10 MG tablet; Take 1 tablet (10 mg total) by mouth 3 (three) times daily.  Dispense: 90 tablet; Refill: 1 - escitalopram (LEXAPRO) 20 MG tablet; Take 1 tablet (20 mg total) by mouth daily.  Dispense: 90 tablet; Refill: 1  5. Fibromyalgia Exercise to keep muscle  warm - oxyCODONE (OXY IR/ROXICODONE) 5 MG immediate release tablet; Take 1 tablet (5 mg total) by mouth in the morning and at bedtime. TAKE (1) TABLET TWICE DAILY.  Dispense: 60 tablet; Refill: 0 - oxyCODONE (OXY IR/ROXICODONE) 5 MG immediate release tablet; Take 1 tablet (5 mg total) by mouth in the morning and at bedtime. TAKE (1) TABLET TWICE DAILY.  Dispense: 60 tablet; Refill: 0 - oxyCODONE (OXY IR/ROXICODONE) 5 MG immediate release tablet; Take 1 tablet (5 mg total) by mouth in the morning and at bedtime. TAKE (1) TABLET TWICE DAILY.  Dispense: 60 tablet; Refill: 0  6. ULCERATIVE COLITIS, LEFT SIDED Watch diet  7. Stage 3b chronic kidney disease (HCC) Labs pending  8. Bradycardia  9. Primary insomnia Bedtime routine - traZODone (DESYREL) 150 MG tablet; TAKE 1/2 TO 1 TABLET  BY MOUTH AT BEDTIME AS NEEDED FOR SLEEP.  Dispense: 30 tablet; Refill: 2   Labs pending Health Maintenance reviewed Diet and exercise encouraged  Follow up plan: 3 months   Mary-Margaret Daphine Deutscher, FNP

## 2023-05-04 NOTE — Patient Instructions (Signed)
Muscle Pain, Adult Muscle pain, also called myalgia, is a condition in which a person has pain in one or more muscles in the body. The pain may be mild, moderate, or severe. It may feel sharp, achy, or burning. In most cases, the pain lasts only a short time and goes away on its own. It is normal to feel some muscle pain after you start a new exercise program. Muscles that have not been used a lot will be sore at first. What are the causes? You may have muscle pain when you use your muscles in a new or different way after not having used them for some time. Muscle pain can also be caused by overuse or by stretching a muscle beyond its normal length (muscle strain). You may be more likely to have muscle pain if you are not in shape. Other causes may include: Injury or bruising. Infectious diseases. These include diseases caused by viruses, such as the flu (influenza). Fibromyalgia. This is a long-term (chronic) condition that causes muscle tenderness, tiredness (fatigue), and headache. Autoimmune or rheumatologic diseases. These are conditions, such as lupus, that cause the body's defense system (immune system) to attack areas in the body. Certain medicines. These include ACE inhibitors and statins. What are the signs or symptoms? The main symptom is sore or painful muscles. Your muscles may be sore when you do activities and when you stretch. You may also have slight swelling. How is this diagnosed? Muscle pain is diagnosed with a physical exam. Your health care provider will ask questions about your pain and when it began. If you have not had muscle pain for very long, your provider may want to wait before doing much testing. If your muscle pain has lasted a long time, tests may be done right away. In some cases, you may need tests to rule out other conditions and diseases. How is this treated? Treatment for muscle pain depends on the cause. Home care is usually enough to relieve the pain. Your  provider may also prescribe NSAIDs, such as ibuprofen. Follow these instructions at home: Medicines Take over-the-counter and prescription medicines only as told by your provider. Ask your health care provider if the medicine prescribed to you requires you to avoid driving or using machinery. Managing pain, swelling, and discomfort     If told, put ice on the painful area for the first 2 days of soreness. Put ice in a plastic bag. Place a towel between your skin and the bag. Leave the ice on for 20 minutes, 2-3 times a day. If your skin turns bright red, remove the ice right away to prevent skin damage. The risk of damage is higher if you cannot feel pain, heat, or cold. For the first 2 days of muscle soreness, or if there is swelling: Do not soak in hot baths. Do not use a hot tub, steam room, sauna, heating pad, or other heat source. After 2-3 days, you may switch between putting ice and heat on the area. If told, apply heat to the affected area as often as told by your provider. Use the heat source that your recommends, such as a moist heat pack or a heating pad. Place a towel between your skin and the heat source. Leave the heat on for 20-30 minutes. If your skin turns bright red, remove the ice or heat right away to prevent skin damage. The risk of damage is higher if you cannot feel pain, heat, or cold. If you have an injury,  raise (elevate) the injured area above the level of your heart while you are sitting or lying down. Activity  If your muscle pain is caused by overuse: Slow down your activities until the pain goes away. Do regular, gentle exercises if you are not normally active. Warm up before you exercise. Stretch before and after you exercise. This can help lower the risk of muscle pain. Do not keep working out if the pain is severe. Severe pain could mean that you have injured a muscle. You may have to avoid lifting. Ask your provider how much you can safely lift. Return  to your normal activities as told by your provider. Ask your provider what activities are safe for you. General instructions Do not use any products that contain nicotine or tobacco. These products include cigarettes, chewing tobacco, and vaping devices, such as e-cigarettes. If you need help quitting, ask your provider. Contact a health care provider if: Your muscle pain gets worse, and medicines do not help. The muscle pain lasts longer than 3 days. You have a rash or fever. You have muscle pain after a tick bite. You have muscle pain when you work out, even though you are in good shape. You have redness, soreness, or swelling. You have muscle pain after you start a new medicine or change the dose of a medicine. Get help right away if: You have trouble breathing. You have trouble swallowing. You have muscle pain along with a stiff neck, fever, and vomiting. You have severe muscle weakness, or you cannot move part of your body. You are urinating less, or you have dark, bloody, or discolored urine. You have redness or swelling at the site of the muscle pain. These symptoms may be an emergency. Get help right away. Call 911. Do not wait to see if the symptoms will go away. Do not drive yourself to the hospital. This information is not intended to replace advice given to you by your health care provider. Make sure you discuss any questions you have with your health care provider. Document Revised: 10/17/2021 Document Reviewed: 10/17/2021 Elsevier Patient Education  2024 [employer].

## 2023-05-05 LAB — CBC WITH DIFFERENTIAL/PLATELET
Basophils Absolute: 0.1 10*3/uL (ref 0.0–0.2)
Basos: 1 %
EOS (ABSOLUTE): 0 10*3/uL (ref 0.0–0.4)
Eos: 0 %
Hematocrit: 39.3 % (ref 34.0–46.6)
Hemoglobin: 13.1 g/dL (ref 11.1–15.9)
Immature Grans (Abs): 0 10*3/uL (ref 0.0–0.1)
Immature Granulocytes: 0 %
Lymphocytes Absolute: 1.6 10*3/uL (ref 0.7–3.1)
Lymphs: 21 %
MCH: 30.3 pg (ref 26.6–33.0)
MCHC: 33.3 g/dL (ref 31.5–35.7)
MCV: 91 fL (ref 79–97)
Monocytes Absolute: 0.5 10*3/uL (ref 0.1–0.9)
Monocytes: 6 %
Neutrophils Absolute: 5.3 10*3/uL (ref 1.4–7.0)
Neutrophils: 72 %
Platelets: 292 10*3/uL (ref 150–450)
RBC: 4.32 x10E6/uL (ref 3.77–5.28)
RDW: 12.7 % (ref 11.7–15.4)
WBC: 7.4 10*3/uL (ref 3.4–10.8)

## 2023-05-05 LAB — CMP14+EGFR
ALT: 14 IU/L (ref 0–32)
AST: 20 IU/L (ref 0–40)
Albumin: 4.3 g/dL (ref 3.7–4.7)
Alkaline Phosphatase: 60 IU/L (ref 44–121)
BUN/Creatinine Ratio: 13 (ref 12–28)
BUN: 17 mg/dL (ref 8–27)
Bilirubin Total: 0.6 mg/dL (ref 0.0–1.2)
CO2: 24 mmol/L (ref 20–29)
Calcium: 9.4 mg/dL (ref 8.7–10.3)
Chloride: 90 mmol/L — ABNORMAL LOW (ref 96–106)
Creatinine, Ser: 1.27 mg/dL — ABNORMAL HIGH (ref 0.57–1.00)
Globulin, Total: 2.4 g/dL (ref 1.5–4.5)
Glucose: 173 mg/dL — ABNORMAL HIGH (ref 70–99)
Potassium: 3.8 mmol/L (ref 3.5–5.2)
Sodium: 129 mmol/L — ABNORMAL LOW (ref 134–144)
Total Protein: 6.7 g/dL (ref 6.0–8.5)
eGFR: 42 mL/min/{1.73_m2} — ABNORMAL LOW (ref >59)

## 2023-05-05 LAB — LIPID PANEL
Cholesterol, Total: 147 mg/dL (ref 100–199)
HDL: 34 mg/dL — ABNORMAL LOW (ref >39)
LDL CALC COMMENT:: 4.3 ratio (ref 0.0–4.4)
LDL Chol Calc (NIH): 61 mg/dL (ref 0–99)
Triglycerides: 335 mg/dL — ABNORMAL HIGH (ref 0–149)
VLDL Cholesterol Cal: 52 mg/dL — ABNORMAL HIGH (ref 5–40)

## 2023-05-24 ENCOUNTER — Other Ambulatory Visit: Payer: Self-pay | Admitting: Nurse Practitioner

## 2023-05-24 DIAGNOSIS — K5150 Left sided colitis without complications: Secondary | ICD-10-CM

## 2023-05-24 DIAGNOSIS — M797 Fibromyalgia: Secondary | ICD-10-CM

## 2023-07-28 ENCOUNTER — Other Ambulatory Visit: Payer: Self-pay | Admitting: Nurse Practitioner

## 2023-07-28 DIAGNOSIS — I10 Essential (primary) hypertension: Secondary | ICD-10-CM

## 2023-08-02 ENCOUNTER — Encounter: Payer: Self-pay | Admitting: Nurse Practitioner

## 2023-08-02 ENCOUNTER — Ambulatory Visit (INDEPENDENT_AMBULATORY_CARE_PROVIDER_SITE_OTHER): Payer: Medicare Other | Admitting: Nurse Practitioner

## 2023-08-02 DIAGNOSIS — K5903 Drug induced constipation: Secondary | ICD-10-CM

## 2023-08-02 DIAGNOSIS — M797 Fibromyalgia: Secondary | ICD-10-CM | POA: Diagnosis not present

## 2023-08-02 MED ORDER — OXYCODONE HCL 5 MG PO TABS: 5.0000 mg | ORAL_TABLET | Freq: Two times a day (BID) | ORAL | 0 refills | Status: DC

## 2023-08-02 MED ORDER — HYDROCORTISONE (PERIANAL) 2.5 % EX CREA: TOPICAL_CREAM | Freq: Two times a day (BID) | CUTANEOUS | 5 refills | Status: AC

## 2023-08-02 MED ORDER — METHOCARBAMOL 500 MG PO TABS: ORAL_TABLET | ORAL | 1 refills | Status: DC

## 2023-08-02 NOTE — Progress Notes (Signed)
 Subjective:    Patient ID: [name], female    DOB: 1940/06/20, 83 y.o.   MRN: [id number]   Chief Complaint: Pain management  HPI  Pain assessment: Cause of pain- ulcerative colitis and fibromyalgia Pain location- lower abdomen and multiple muscle areas Pain on scale of 1-10- 5/10 today Frequency- daily What increases pain-to much activity or certain foods she eats What makes pain Better-pain meds help Effects on ADL - none Any change in general medical condition-none  Current opioids rx- oxycodone  # meds rx- 60 Effectiveness of current meds-none Adverse reactions from pain meds-none Morphine  equivalent- 15 MME  Pill count performed-No Last drug screen - 02/04/23 ( high risk q58m, moderate risk q88m, low risk yearly ) Urine drug screen today- No Was the [facility] reviewed- yes  If yes were their any concerning findings? - no   Overdose risk: 1     No data to display           Pain contract signed on:  Patient Active Problem List   Diagnosis Date Noted   Precordial chest pain 08/23/2022   Diet-controlled diabetes mellitus (HCC) 05/27/2020   Murmur 08/01/2019   CKD (chronic kidney disease) stage 3, GFR 30-59 ml/min (HCC) 11/07/2015   Hyperlipidemia associated with type 2 diabetes mellitus (HCC) 01/24/2015   Insomnia 01/24/2015   Bradycardia 05/03/2011   Fibromyalgia 11/04/2010   Depression 11/04/2010   Essential hypertension 03/08/2007   ULCERATIVE COLITIS, LEFT SIDED 03/08/2007       Review of Systems  Constitutional:  Negative for diaphoresis.  Eyes:  Negative for pain.  Respiratory:  Negative for shortness of breath.   Cardiovascular:  Negative for chest pain, palpitations and leg swelling.  Gastrointestinal:  Negative for abdominal pain.  Endocrine: Negative for polydipsia.  Skin:  Negative for rash.  Neurological:  Negative for dizziness, weakness and headaches.  Hematological:  Does not bruise/bleed easily.  All other systems reviewed and  are negative.      Objective:   Physical Exam Constitutional:      Appearance: Normal appearance.  Cardiovascular:     Rate and Rhythm: Normal rate and regular rhythm.     Heart sounds: Normal heart sounds.  Pulmonary:     Breath sounds: Normal breath sounds.  Abdominal:     General: Abdomen is flat.     Palpations: Abdomen is soft.     Tenderness: There is abdominal tenderness (mild diffuse).  Skin:    General: Skin is warm.  Neurological:     General: No focal deficit present.     Mental Status: She is alert and oriented to person, place, and time.  Psychiatric:        Mood and Affect: Mood normal.        Behavior: Behavior normal.     BP (!) 161/91   Pulse 81   Temp 97.7 F (36.5 C) (Temporal)   Ht 5\' 9"  (1.753 m)   Wt 166 lb (75.3 kg)   SpO2 96%   BMI 24.51 kg/m         Assessment & Plan:   [name] comes in today with chief complaint of No chief complaint on file.   Diagnosis and orders addressed:  1. Fibromyalgia Moist heat to areas that hurt Increase exercise RTO in 3 months - oxyCODONE  (OXY IR/ROXICODONE ) 5 MG immediate release tablet; Take 1 tablet (5 mg total) by mouth in the morning and at bedtime. TAKE (1) TABLET TWICE DAILY.  Dispense: 60 tablet; Refill:  0 - oxyCODONE  (OXY IR/ROXICODONE ) 5 MG immediate release tablet; Take 1 tablet (5 mg total) by mouth in the morning and at bedtime. TAKE (1) TABLET TWICE DAILY.  Dispense: 60 tablet; Refill: 0 - oxyCODONE  (OXY IR/ROXICODONE ) 5 MG immediate release tablet; Take 1 tablet (5 mg total) by mouth in the morning and at bedtime. TAKE (1) TABLET TWICE DAILY.  Dispense: 60 tablet; Refill: 0   Labs pending Health Maintenance reviewed Diet and exercise encouraged  Follow up plan: 3 months   Mary-Margaret Gaylyn Keas, FNP

## 2023-08-03 ENCOUNTER — Other Ambulatory Visit: Payer: Self-pay | Admitting: Nurse Practitioner

## 2023-08-03 DIAGNOSIS — K5150 Left sided colitis without complications: Secondary | ICD-10-CM

## 2023-08-03 DIAGNOSIS — F3342 Major depressive disorder, recurrent, in full remission: Secondary | ICD-10-CM

## 2023-08-17 ENCOUNTER — Other Ambulatory Visit: Payer: Self-pay | Admitting: Nurse Practitioner

## 2023-08-17 ENCOUNTER — Ambulatory Visit: Payer: Self-pay

## 2023-08-17 NOTE — Telephone Encounter (Signed)
 Chief Complaint: Diarrhea Symptoms: Diarrhea and abdominal pain, dizziness, blood in stool, possible fever Frequency: 5 days Pertinent Negatives: Patient denies n/a Disposition: [] ED /[x] Urgent Care (no appt availability in office) / [] Appointment(In office/virtual)/ []  [facility]/ [] Home Care/ [] Refused Recommended Disposition /[] [facility]/ []  Follow-up with PCP Additional Notes: Patient called in stating she has had diarrhea for 5 days, and stating she had 15 bowel movements in 24 days. Patient reports mild blood in her stool intermittently. Patient reports pain in her left abdomen and suffers from IBS and diverticulitis Patient states she has intermittent dizziness. Patient advised to go to ED or UC for evaluation. Patient states she does not want to leave her home because she is scared of a diarrhea accident and does not want to go anywhere. Patient advised to call 911 if she feels worse/faint. Patient stated she will do so. Patient asking if provider would like any meds called in for her.    Copied from CRM [id number]. Topic: Clinical - Medical Advice >> Aug 17, 2023  5:04 PM [place] B wrote: Reason for CRM: Reason for CRM: pt has had diarrhea for 5 days, immodium isnt working, no appt until June 3 and pt has to find a ride Reason for Disposition  [1] SEVERE diarrhea (e.g., 7 or more times / day more than normal) AND [2] age > 60 years  Answer Assessment - Initial Assessment Questions 1. DIARRHEA SEVERITY: "How bad is the diarrhea?" "How many more stools have you had in the past 24 hours than normal?"    - NO DIARRHEA (SCALE 0)   - MILD (SCALE 1-3): Few loose or mushy BMs; increase of 1-3 stools over normal daily number of stools; mild increase in ostomy output.   -  MODERATE (SCALE 4-7): Increase of 4-6 stools daily over normal; moderate increase in ostomy output.   -  SEVERE (SCALE 8-10; OR "WORST POSSIBLE"): Increase of 7 or more stools daily over normal; moderate  increase in ostomy output; incontinence.     15 2. ONSET: "When did the diarrhea begin?"      5 days ago 3. BM CONSISTENCY: "How loose or watery is the diarrhea?"      Watery  4. VOMITING: "Are you also vomiting?" If Yes, ask: "How many times in the past 24 hours?"      No 5. ABDOMEN PAIN: "Are you having any abdomen pain?" If Yes, ask: "What does it feel like?" (e.g., crampy, dull, intermittent, constant)      Yes in left side - patient has IBS and diverticulitis  6. ABDOMEN PAIN SEVERITY: If present, ask: "How bad is the pain?"  (e.g., Scale 1-10; mild, moderate, or severe)   - MILD (1-3): doesn't interfere with normal activities, abdomen soft and not tender to touch    - MODERATE (4-7): interferes with normal activities or awakens from sleep, abdomen tender to touch    - SEVERE (8-10): excruciating pain, doubled over, unable to do any normal activities       7 8. HYDRATION: "Any signs of dehydration?" (e.g., dry mouth [not just dry lips], too weak to stand, dizziness, new weight loss) "When did you last urinate?"     Intermittent dizziness, weakness 10. ANTIBIOTIC USE: "Are you taking antibiotics now or have you taken antibiotics in the past 2 months?"       No 11. OTHER SYMPTOMS: "Do you have any other symptoms?" (e.g., fever, blood in stool)       Mild  blood, dizziness  Protocols used: Diarrhea-A-AH

## 2023-08-17 NOTE — Telephone Encounter (Signed)
 Called and check on patient she is taken imodium  and it is helping she will call if she needs anything

## 2023-08-17 NOTE — Telephone Encounter (Signed)
 Please call and check on patient, if still having symptoms needs to be seen.

## 2023-08-17 NOTE — Telephone Encounter (Signed)
  Chief Complaint: diarrhea x 4 days Symptoms: watery diarrhea Frequency: reports greater than 7 times per day since Friday Pertinent Negatives: Patient denies fever Disposition: [] ED /[] Urgent Care (no appt availability in office) / [] Appointment(In office/virtual)/ []  [facility]/ [] Home Care/ [x] Refused Recommended Disposition /[] [facility]/ []  Follow-up with PCP  Additional Notes: patient with ulcerative colitis with diarrhea x 4 days. Blood on toilet paper, has tried immodium, but diarrhea returns.  She would like medication called in.  Recommended in office appointment or UC visit.  Patient refused stating that she is afraid of soiling herself while in route or at office or UC    Copied From CRM [id number]. Reason for Triage: Diarrhea for 5 days now, patient would like to speak to nurse     Reason for Disposition  [1] SEVERE diarrhea (e.g., 7 or more times / day more than normal) AND [2] age > 60 years  Answer Assessment - Initial Assessment Questions 1. DIARRHEA SEVERITY: "How bad is the diarrhea?" "How many more stools have you had in the past 24 hours than normal?"    - NO DIARRHEA (SCALE 0)   - MILD (SCALE 1-3): Few loose or mushy BMs; increase of 1-3 stools over normal daily number of stools; mild increase in ostomy output.   -  MODERATE (SCALE 4-7): Increase of 4-6 stools daily over normal; moderate increase in ostomy output.   -  SEVERE (SCALE 8-10; OR "WORST POSSIBLE"): Increase of 7 or more stools daily over normal; moderate increase in ostomy output; incontinence.     moderate 2. ONSET: "When did the diarrhea begin?"      4 days ago 3. BM CONSISTENCY: "How loose or watery is the diarrhea?"      Watery/loose 4. VOMITING: "Are you also vomiting?" If Yes, ask: "How many times in the past 24 hours?"      no 5. ABDOMEN PAIN: "Are you having any abdomen pain?" If Yes, ask: "What does it feel like?" (e.g., crampy, dull, intermittent, constant)      Yes,  left side, abdomen hard 6. ABDOMEN PAIN SEVERITY: If present, ask: "How bad is the pain?"  (e.g., Scale 1-10; mild, moderate, or severe)   - MILD (1-3): doesn't interfere with normal activities, abdomen soft and not tender to touch    - MODERATE (4-7): interferes with normal activities or awakens from sleep, abdomen tender to touch    - SEVERE (8-10): excruciating pain, doubled over, unable to do any normal activities       6/10 7. ORAL INTAKE: If vomiting, "Have you been able to drink liquids?" "How much liquids have you had in the past 24 hours?"     Able to eat /drink, but results in diarrhea 8. HYDRATION: "Any signs of dehydration?" (e.g., dry mouth [not just dry lips], too weak to stand, dizziness, new weight loss) "When did you last urinate?"     Dizzy at times 910. ANTIBIOTIC USE: "Are you taking antibiotics now or have you taken antibiotics in the past 2 months?"       no 11. OTHER SYMPTOMS: "Do you have any other symptoms?" (e.g., fever, blood in stool)       Dark red blood on toilet paper  Protocols used: Diarrhea-A-AH

## 2023-08-18 ENCOUNTER — Encounter

## 2023-08-19 ENCOUNTER — Telehealth: Payer: Self-pay

## 2023-08-19 ENCOUNTER — Ambulatory Visit: Payer: Self-pay

## 2023-08-19 NOTE — Telephone Encounter (Signed)
  Chief Complaint: Diarrhea Symptoms: diarrhea Frequency: X 1 weeks Pertinent Negatives: Patient denies abdominal pain, fever Disposition: [] ED /[] Urgent Care (no appt availability in office) / [x] Appointment(In office/virtual)/ []  [facility]/ [] Home Care/ [x] Refused Recommended Disposition /[] [facility]/ []  Follow-up with PCP Additional Notes:  Diarrhea for one week. Using imodium  without effect. Abdominal cramps intermittently. Has small amount of blood from known hemorrhoid. Drinking plenty of fluid. Acute evaluation advised. Refuses acute evaluation, "I can't come in, I'm afraid I will mess my pants". Requesting for Carolann Chum to call in prescription for diarrhea. Aware message will be sent to office for review and follow up. Educated on care advice as documented in protocol, patient verbalized understanding.. Please return call to patient.      Message from Hawthorne R sent at 08/19/2023 10:59 AM EDT  Summary: Diarrhea   Copied From CRM [id number]. Reason for Triage: Patient has been experiencing diarrhea for the last week. Has been taking imodium , says it helps a little but it is not getting any better. diarrhea  Patient can be reached at [phone]      Reason for Disposition  [1] MODERATE diarrhea (e.g., 4-6 times / day more than normal) AND [2] age > 70 years  Protocols used: [facility]

## 2023-08-19 NOTE — Telephone Encounter (Signed)
 Diarrhea is some better today. No more abdominal pain then usual. Is on imodium 

## 2023-08-19 NOTE — Telephone Encounter (Signed)
 Copied from CRM [id number]. Topic: General - Other >> Aug 19, 2023  1:21 PM [name] wrote: Reason for CRM: Patient calling to check on request after speaking to NT. Pt requesting provider be notified of her message as soon as possible.

## 2023-08-20 ENCOUNTER — Telehealth: Payer: Self-pay

## 2023-08-20 NOTE — Telephone Encounter (Signed)
 Copied from CRM [id number]. Topic: General - Other >> Aug 20, 2023 12:52 PM Sophia H wrote: Reason for CRM: Checking to see if pcp received msg from her abt meds for diarrhea, advs allow more time may be busy w patients, please advise

## 2023-08-20 NOTE — Telephone Encounter (Signed)
 Please see MyChart message-Advised pt of below from PCP message:pt voiced understanding  There is nothing else I can give you for diarrhea- if continues will need to do stool cultures

## 2023-08-20 NOTE — Telephone Encounter (Signed)
 Pt calling to give update, pt states she is doing a bit better. Pt is requesting something else for the diarrhea.   Please assist pt further

## 2023-09-01 ENCOUNTER — Other Ambulatory Visit: Payer: Self-pay | Admitting: Nurse Practitioner

## 2023-09-01 DIAGNOSIS — F3342 Major depressive disorder, recurrent, in full remission: Secondary | ICD-10-CM

## 2023-10-01 ENCOUNTER — Other Ambulatory Visit: Payer: Self-pay | Admitting: Nurse Practitioner

## 2023-10-01 DIAGNOSIS — F5101 Primary insomnia: Secondary | ICD-10-CM

## 2023-10-01 DIAGNOSIS — K5150 Left sided colitis without complications: Secondary | ICD-10-CM

## 2023-10-26 ENCOUNTER — Other Ambulatory Visit: Payer: Self-pay | Admitting: Nurse Practitioner

## 2023-10-26 DIAGNOSIS — I10 Essential (primary) hypertension: Secondary | ICD-10-CM

## 2023-10-26 DIAGNOSIS — F3342 Major depressive disorder, recurrent, in full remission: Secondary | ICD-10-CM

## 2023-10-28 ENCOUNTER — Ambulatory Visit: Admitting: Nurse Practitioner

## 2023-10-28 ENCOUNTER — Encounter: Payer: Self-pay | Admitting: Nurse Practitioner

## 2023-10-28 VITALS — BP 141/80 | HR 86 | Temp 97.8°F | Ht 69.0 in | Wt 164.0 lb

## 2023-10-28 DIAGNOSIS — Z0001 Encounter for general adult medical examination with abnormal findings: Secondary | ICD-10-CM

## 2023-10-28 DIAGNOSIS — E1169 Type 2 diabetes mellitus with other specified complication: Secondary | ICD-10-CM

## 2023-10-28 DIAGNOSIS — R001 Bradycardia, unspecified: Secondary | ICD-10-CM

## 2023-10-28 DIAGNOSIS — E785 Hyperlipidemia, unspecified: Secondary | ICD-10-CM

## 2023-10-28 DIAGNOSIS — E119 Type 2 diabetes mellitus without complications: Secondary | ICD-10-CM

## 2023-10-28 DIAGNOSIS — K5150 Left sided colitis without complications: Secondary | ICD-10-CM

## 2023-10-28 DIAGNOSIS — I10 Essential (primary) hypertension: Secondary | ICD-10-CM

## 2023-10-28 DIAGNOSIS — F3342 Major depressive disorder, recurrent, in full remission: Secondary | ICD-10-CM | POA: Diagnosis not present

## 2023-10-28 DIAGNOSIS — F5101 Primary insomnia: Secondary | ICD-10-CM

## 2023-10-28 DIAGNOSIS — M797 Fibromyalgia: Secondary | ICD-10-CM

## 2023-10-28 DIAGNOSIS — Z0000 Encounter for general adult medical examination without abnormal findings: Secondary | ICD-10-CM

## 2023-10-28 DIAGNOSIS — N1832 Chronic kidney disease, stage 3b: Secondary | ICD-10-CM

## 2023-10-28 LAB — LIPID PANEL

## 2023-10-28 LAB — BAYER DCA HB A1C WAIVED: HB A1C (BAYER DCA - WAIVED): 9 % — ABNORMAL HIGH (ref 4.8–5.6)

## 2023-10-28 MED ORDER — OXYCODONE HCL 5 MG PO TABS: 5.0000 mg | ORAL_TABLET | Freq: Two times a day (BID) | ORAL | 0 refills | Status: DC

## 2023-10-28 MED ORDER — AMLODIPINE BESYLATE 5 MG PO TABS: 5.0000 mg | ORAL_TABLET | Freq: Every day | ORAL | 1 refills | Status: DC

## 2023-10-28 MED ORDER — DICYCLOMINE HCL 20 MG PO TABS: ORAL_TABLET | ORAL | 2 refills | Status: DC

## 2023-10-28 MED ORDER — ESCITALOPRAM OXALATE 20 MG PO TABS: 20.0000 mg | ORAL_TABLET | Freq: Every day | ORAL | 1 refills | Status: DC

## 2023-10-28 MED ORDER — ONDANSETRON HCL 4 MG PO TABS
4.0000 mg | ORAL_TABLET | Freq: Three times a day (TID) | ORAL | 1 refills | Status: DC | PRN
Start: 2023-10-28 — End: 2023-11-05

## 2023-10-28 MED ORDER — BENAZEPRIL-HYDROCHLOROTHIAZIDE 20-12.5 MG PO TABS: 2.0000 | ORAL_TABLET | Freq: Every day | ORAL | 1 refills | Status: DC

## 2023-10-28 MED ORDER — JANUVIA 100 MG PO TABS: 100.0000 mg | ORAL_TABLET | Freq: Every day | ORAL | 1 refills | Status: DC

## 2023-10-28 MED ORDER — ROSUVASTATIN CALCIUM 10 MG PO TABS: 10.0000 mg | ORAL_TABLET | Freq: Every day | ORAL | 1 refills | Status: DC

## 2023-10-28 MED ORDER — BUSPIRONE HCL 10 MG PO TABS: 10.0000 mg | ORAL_TABLET | Freq: Three times a day (TID) | ORAL | 1 refills | Status: DC

## 2023-10-28 MED ORDER — TRAZODONE HCL 150 MG PO TABS: ORAL_TABLET | ORAL | 2 refills | Status: DC

## 2023-10-28 NOTE — Progress Notes (Signed)
 Subjective:    Patient ID: [name], female    DOB: [birth year], 83 y.o.   MRN: [id number]   Chief Complaint: annual physical    HPI:  [name] is a 83 y.o. who identifies as a female who was assigned female at birth.   Social history: Lives with: by herself Work history: rteired   Comes in today for follow up of the following chronic medical issues:  1. Essential hypertension No c/o chest pain, sob or headache. Doe snot check blood pressure at home. BP Readings from Last 3 Encounters:  08/02/23 (!) 161/91  05/04/23 119/77  02/04/23 (!) 169/84     2. Hyperlipidemia associated with type 2 diabetes mellitus (HCC) Does not watch diet and does no dedicated exercise Lab Results  Component Value Date   CHOL 147 05/04/2023   HDL 34 (L) 05/04/2023   LDLCALC 61 05/04/2023   TRIG 335 (H) 05/04/2023   CHOLHDL 4.3 05/04/2023     3. Recurrent major depressive disorder, in full remission (HCC) Is on combination of lexapro  and buspar . Is doing well.     10/28/2023    2:31 PM 08/02/2023    2:23 PM 05/04/2023    2:26 PM  Depression screen PHQ 2/9  Decreased Interest 1 0 0  Down, Depressed, Hopeless 0 0 1  PHQ - 2 Score 1 0 1  Altered sleeping  0 1  Tired, decreased energy  0 1  Change in appetite  0 0  Feeling bad or failure about yourself   0 0  Trouble concentrating  0 0  Moving slowly or fidgety/restless  0 0  Suicidal thoughts  0 0  PHQ-9 Score  0 3  Difficult doing work/chores  Not difficult at all Not difficult at all      10/28/2023    2:31 PM 08/02/2023    2:23 PM 05/04/2023    2:27 PM 01/04/2023    2:01 PM  GAD 7 : Generalized Anxiety Score  Nervous, Anxious, on Edge 0 0 1 1  Control/stop worrying 0 0 1 0  Worry too much - different things 1 0 1 1  Trouble relaxing 1 0 1 1  Restless 1 0 0 0  Easily annoyed or irritable 0 0 0 0  Afraid - awful might happen 0 0 1 0  Total GAD 7 Score 3 0 5 3  Anxiety Difficulty Not difficult at all Not difficult at  all Not difficult at all Somewhat difficult         4. Fibromyalgia Pain assessment: Cause of pain- fibromyalgia Pain location- varies from day to day Pain on scale of 1-10- 8-9/10- has ran out of pain meds Frequency- daily What increases pain-to much activity What makes pain Better-rest Effects on ADL - none Any change in general medical condition-no  Current opioids rx- oxycodone  5mg  BID # meds rx- 60 Effectiveness of current meds-helps Adverse reactions from pain meds-none Morphine  equivalent- 15 MME  Pill count performed-No Last drug screen - 04/10/21 ( high risk q34m, moderate risk q81m, low risk yearly ) Urine drug screen today- No Was the [facility] reviewed- yes  If yes were their any concerning findings? - no   Overdose risk: 1   Pain contract signed on:02/04/23   5.  diabetes mellitus oral meds (HCC) Fasting blood sugars are running around. Lab Results  Component Value Date   HGBA1C 7.8 (H) 05/04/2023     6. ULCERATIVE COLITIS, LEFT SIDED Has chronic pain and often  diarrhea  7. Stage 3b chronic kidney disease (HCC) No voiding issues Lab Results  Component Value Date   CREATININE 1.27 (H) 05/04/2023     8. Bradycardia Denies syncopal or near syncopal episodes. Pulse Readings from Last 3 Encounters:  08/02/23 81  05/04/23 73  02/04/23 86     9. Primary insomnia Is on trazadone and is doing well    New complaints: None today  Allergies  Allergen Reactions   Azathioprine Nausea And Vomiting and Other (See Comments)    SEVERE NAUSEA AND VOMITING WITH CHEST PAIN-  PATIENT INSISTS NEVER TO BE GIVEN ANYTHING SIMILAR TO THIS   Tape Other (See Comments) and Rash    BLISTERS AND SKIN TEARING   Codeine Nausea And Vomiting   Milk (Cow) Nausea And Vomiting   Penicillins Hives    Has patient had a PCN reaction causing immediate rash, facial/tongue/throat swelling, SOB or lightheadedness with hypotension: No Has patient had a PCN reaction causing  severe rash involving mucus membranes or skin necrosis: No Has patient had a PCN reaction that required hospitalization: No Has patient had a PCN reaction occurring within the last 10 years: No If all of the above answers are NO, then may proceed with Cephalosporin use.    Povidone-Iodine  Hives    BETADINE   Procaine Hcl Other (See Comments)    SEVERE GI UPSET (NAUSEA,VOMITING AND DIARRHEA)   Sulfonamide Derivatives Hives   Tramadol  Itching   Acyclovir And Related    Azithromycin      Mycins    Clonazepam  Other (See Comments)    Skin burning, insomnia, diarrhea.   Dairy Aid [Tilactase]    Doxycycline  Rash   Latex Rash   Niacin And Related Rash   Outpatient Encounter Medications as of 10/28/2023  Medication Sig   amLODipine  (NORVASC ) 5 MG tablet TAKE ONE TABLET DAILY   antiseptic oral rinse (BIOTENE) LIQD 1 application by Mouth Rinse route daily. FOR DRY MOUTH   benazepril -hydrochlorthiazide (LOTENSIN  HCT) 20-12.5 MG tablet TAKE TWO TABLETS BY MOUTH DAILY   Blood Glucose Monitoring Suppl (ONETOUCH VERIO FLEX SYSTEM) w/Device KIT USE AS DIRECTED WITH TESTING SUPPLIES UP TO 4 TIMES A DAY   busPIRone  (BUSPAR ) 10 MG tablet TAKE ONE TABLET BY MOUTH THREE TIMES DAILY   co-enzyme Q-10 50 MG capsule Take 50 mg by mouth once a week.   dicyclomine  (BENTYL ) 20 MG tablet TAKE ONE-HALF TO 1 TABLET DAILY   escitalopram  (LEXAPRO ) 20 MG tablet Take 1 tablet (20 mg total) by mouth daily.   glucose blood (ONETOUCH VERIO) test strip Test BS up to 4 times daily or as directed Dx E11.22   hydrocortisone  (ANUSOL -HC) 2.5 % rectal cream Place rectally 2 (two) times daily.   JANUVIA  100 MG tablet Take 1 tablet (100 mg total) by mouth daily.   Lancets (ONETOUCH DELICA PLUS LANCET33G) MISC CHECK BLOOD SUGAR UP TO 4 TIMES A DAY OR AS DIRECTED   Loperamide  HCl (IMODIUM  PO) Take 1 tablet by mouth daily as needed (diarrhea).   methocarbamol  (ROBAXIN ) 500 MG tablet TAKE ONE TABLET EVERY 6 HOURS AS NEEDED FOR  MUSCLE SPASMS   Multiple Vitamins-Minerals (WOMENS MULTI VITAMIN & MINERAL PO) Take 1 tablet by mouth every other day.   ondansetron  (ZOFRAN ) 4 MG tablet TAKE ONE TABLET EVERY 8 HOURS AS NEEDED FOR NAUSEA AND VOMITING   oxyCODONE  (OXY IR/ROXICODONE ) 5 MG immediate release tablet Take 1 tablet (5 mg total) by mouth in the morning and at bedtime. TAKE (1) TABLET TWICE  DAILY.   oxyCODONE  (OXY IR/ROXICODONE ) 5 MG immediate release tablet Take 1 tablet (5 mg total) by mouth in the morning and at bedtime. TAKE (1) TABLET TWICE DAILY.   oxyCODONE  (OXY IR/ROXICODONE ) 5 MG immediate release tablet Take 1 tablet (5 mg total) by mouth in the morning and at bedtime. TAKE (1) TABLET TWICE DAILY.   Probiotic Product (PROBIOTIC FORMULA PO) Take 1-2 capsules by mouth every morning.   rosuvastatin  (CRESTOR ) 10 MG tablet TAKE ONE TABLET ONCE DAILY   shark liver oil-cocoa butter (PREPARATION H) 0.25-3-85.5 % suppository Place 1 suppository rectally as needed for hemorrhoids.   traZODone  (DESYREL ) 150 MG tablet TAKE ONE-HALF TO 1 TABLET AT BEDTIME AS NEEDED FOR SLEEP   vitamin E  400 UNIT capsule Take 400 Units by mouth every morning.   Omega-3 Fatty Acids (FISH OIL) 1000 MG CPDR Take 3 capsules by mouth daily.   [DISCONTINUED] sodium chloride  (OCEAN) 0.65 % SOLN nasal spray Place 2 sprays into the nose as needed for congestion.   No facility-administered encounter medications on file as of 10/28/2023.    Past Surgical History:  Procedure Laterality Date   ABDOMINAL HYSTERECTOMY     BIOPSY N/A 12/28/2014   Procedure: SIGMOID AND RECTAL BIOPSIES;  Surgeon: Claudis RAYMOND Rivet, MD;  Location: [facility];  Service: Endoscopy;  Laterality: N/A;   CHOLECYSTECTOMY     s/p   COLONOSCOPY  06/16/06. 04/27/2007   proctitis seen, biopsies showed chronic active colitis, no dysplasia    EYE SURGERY     clear lense extraction 1998    FLEXIBLE SIGMOIDOSCOPY N/A 12/28/2014   Procedure: FLEXIBLE SIGMOIDOSCOPY WITH PROPOFOL ;  Surgeon:  Claudis RAYMOND Rivet, MD;  Location: [facility];  Service: Endoscopy;  Laterality: N/A;    Family History  Problem Relation Age of Onset   Heart attack Mother 32       Died in her sleep   Diabetes Mother    Heart attack Father 76       Died in her sleep   Cancer Sister        liver, breast    Heart disease Sister    Breast cancer Maternal Aunt    Breast cancer Maternal Aunt    Heart attack Brother 74       Defib, CABG   Diabetes Brother    Kidney disease Brother        dyalasis         Review of Systems  Constitutional:  Negative for diaphoresis.  Eyes:  Negative for pain.  Respiratory:  Negative for shortness of breath.   Cardiovascular:  Negative for chest pain, palpitations and leg swelling.  Gastrointestinal:  Negative for abdominal pain.  Endocrine: Negative for polydipsia.  Musculoskeletal:  Positive for myalgias.  Skin:  Negative for rash.  Neurological:  Negative for dizziness, weakness and headaches.  Hematological:  Does not bruise/bleed easily.  All other systems reviewed and are negative.      Objective:   Physical Exam Vitals and nursing note reviewed.  Constitutional:      General: She is not in acute distress.    Appearance: Normal appearance. She is well-developed.  HENT:     Head: Normocephalic.     Right Ear: Tympanic membrane normal.     Left Ear: Tympanic membrane normal.     Nose: Nose normal.     Mouth/Throat:     Mouth: Mucous membranes are moist.  Eyes:     Pupils: Pupils are equal, round, and reactive  to light.  Neck:     Vascular: No carotid bruit or JVD.  Cardiovascular:     Rate and Rhythm: Normal rate and regular rhythm.     Heart sounds: Normal heart sounds.  Pulmonary:     Effort: Pulmonary effort is normal. No respiratory distress.     Breath sounds: Normal breath sounds. No wheezing or rales.  Chest:     Chest wall: No tenderness.  Abdominal:     General: Bowel sounds are normal. There is no distension or abdominal bruit.      Palpations: Abdomen is soft. There is no hepatomegaly, splenomegaly, mass or pulsatile mass.     Tenderness: There is no abdominal tenderness.  Musculoskeletal:        General: Normal range of motion.     Cervical back: Normal range of motion and neck supple.  Lymphadenopathy:     Cervical: No cervical adenopathy.  Skin:    General: Skin is warm and dry.  Neurological:     Mental Status: She is alert and oriented to person, place, and time.     Deep Tendon Reflexes: Reflexes are normal and symmetric.  Psychiatric:        Behavior: Behavior normal.        Thought Content: Thought content normal.        Judgment: Judgment normal.     Ht 5' 9 (1.753 m)   Wt 164 lb (74.4 kg)   BMI 24.22 kg/m     HGBA1c 7.8%      Assessment & Plan:  [name] comes in today with chief complaint of annual pohysical  Diagnosis and orders addressed:  1. Essential hypertension (Primary) Low sodium diet - CBC with Differential/Platelet - CMP14+EGFR  2. Hyperlipidemia associated with type 2 diabetes mellitus (HCC) Low fat diet - Lipid panel  3.oral meds diabetes mellitus (HCC) Watch cabs in diet- stricter Much watch crabs or going to have to change meds- will recheck in 3 months - Bayer DCA Hb A1c Waived  4. Recurrent major depressive disorder, in full remission (HCC) Stress management - busPIRone  (BUSPAR ) 10 MG tablet; Take 1 tablet (10 mg total) by mouth 3 (three) times daily.  Dispense: 90 tablet; Refill: 1 - escitalopram  (LEXAPRO ) 20 MG tablet; Take 1 tablet (20 mg total) by mouth daily.  Dispense: 90 tablet; Refill: 1  5. Fibromyalgia Exercise to keep muscle warm - oxyCODONE  (OXY IR/ROXICODONE ) 5 MG immediate release tablet; Take 1 tablet (5 mg total) by mouth in the morning and at bedtime. TAKE (1) TABLET TWICE DAILY.  Dispense: 60 tablet; Refill: 0 - oxyCODONE  (OXY IR/ROXICODONE ) 5 MG immediate release tablet; Take 1 tablet (5 mg total) by mouth in the morning and at bedtime.  TAKE (1) TABLET TWICE DAILY.  Dispense: 60 tablet; Refill: 0 - oxyCODONE  (OXY IR/ROXICODONE ) 5 MG immediate release tablet; Take 1 tablet (5 mg total) by mouth in the morning and at bedtime. TAKE (1) TABLET TWICE DAILY.  Dispense: 60 tablet; Refill: 0  6. ULCERATIVE COLITIS, LEFT SIDED Watch diet  7. Stage 3b chronic kidney disease (HCC) Labs pending  8. Bradycardia  9. Primary insomnia Bedtime routine - traZODone  (DESYREL ) 150 MG tablet; TAKE 1/2 TO 1 TABLET BY MOUTH AT BEDTIME AS NEEDED FOR SLEEP.  Dispense: 30 tablet; Refill: 2   Labs pending Health Maintenance reviewed Diet and exercise encouraged  Follow up plan: 3 months   Mary-Margaret Gladis, FNP

## 2023-10-28 NOTE — Patient Instructions (Signed)
 Fall Prevention in the Home, Adult Falls can cause injuries and can happen to people of all ages. There are many things you can do to make your home safer and to help prevent falls. What actions can I take to prevent falls? General information Use good lighting in all rooms. Make sure to: Replace any light bulbs that burn out. Turn on the lights in dark areas and use night-lights. Keep items that you use often in easy-to-reach places. Lower the shelves around your home if needed. Move furniture so that there are clear paths around it. Do not use throw rugs or other things on the floor that can make you trip. If any of your floors are uneven, fix them. Add color or contrast paint or tape to clearly mark and help you see: Grab bars or handrails. First and last steps of staircases. Where the edge of each step is. If you use a ladder or stepladder: Make sure that it is fully opened. Do not climb a closed ladder. Make sure the sides of the ladder are locked in place. Have someone hold the ladder while you use it. Know where your pets are as you move through your home. What can I do in the bathroom?     Keep the floor dry. Clean up any water on the floor right away. Remove soap buildup in the bathtub or shower. Buildup makes bathtubs and showers slippery. Use non-skid mats or decals on the floor of the bathtub or shower. Attach bath mats securely with double-sided, non-slip rug tape. If you need to sit down in the shower, use a non-slip stool. Install grab bars by the toilet and in the bathtub and shower. Do not use towel bars as grab bars. What can I do in the bedroom? Make sure that you have a light by your bed that is easy to reach. Do not use any sheets or blankets on your bed that hang to the floor. Have a firm chair or bench with side arms that you can use for support when you get dressed. What can I do in the kitchen? Clean up any spills right away. If you need to reach something  above you, use a step stool with a grab bar. Keep electrical cords out of the way. Do not use floor polish or wax that makes floors slippery. What can I do with my stairs? Do not leave anything on the stairs. Make sure that you have a light switch at the top and the bottom of the stairs. Make sure that there are handrails on both sides of the stairs. Fix handrails that are broken or loose. Install non-slip stair treads on all your stairs if they do not have carpet. Avoid having throw rugs at the top or bottom of the stairs. Choose a carpet that does not hide the edge of the steps on the stairs. Make sure that the carpet is firmly attached to the stairs. Fix carpet that is loose or worn. What can I do on the outside of my home? Use bright outdoor lighting. Fix the edges of walkways and driveways and fix any cracks. Clear paths of anything that can make you trip, such as tools or rocks. Add color or contrast paint or tape to clearly mark and help you see anything that might make you trip as you walk through a door, such as a raised step or threshold. Trim any bushes or trees on paths to your home. Check to see if handrails are loose  or broken and that both sides of all steps have handrails. Install guardrails along the edges of any raised decks and porches. Have leaves, snow, or ice cleared regularly. Use sand, salt, or ice melter on paths if you live where there is ice and snow during the winter. Clean up any spills in your garage right away. This includes grease or oil spills. What other actions can I take? Review your medicines with your doctor. Some medicines can cause dizziness or changes in blood pressure, which increase your risk of falling. Wear shoes that: Have a low heel. Do not wear high heels. Have rubber bottoms and are closed at the toe. Feel good on your feet and fit well. Use tools that help you move around if needed. These include: Canes. Walkers. Scooters. Crutches. Ask  your doctor what else you can do to help prevent falls. This may include seeing a physical therapist to learn to do exercises to move better and get stronger. Where to find more information Centers for Disease Control and Prevention, [employer]: TonerPromos.no [employer] on Aging: BaseRingTones.pl National Institute on Aging: BaseRingTones.pl Contact a doctor if: You are afraid of falling at home. You feel weak, drowsy, or dizzy at home. You fall at home. Get help right away if you: Lose consciousness or have trouble moving after a fall. Have a fall that causes a head injury. These symptoms may be an emergency. Get help right away. Call 911. Do not wait to see if the symptoms will go away. Do not drive yourself to the hospital. This information is not intended to replace advice given to you by your health care provider. Make sure you discuss any questions you have with your health care provider. Document Revised: 11/10/2021 Document Reviewed: 11/10/2021 Elsevier Patient Education  2024 [employer].

## 2023-10-29 ENCOUNTER — Ambulatory Visit: Payer: Self-pay | Admitting: Nurse Practitioner

## 2023-10-29 LAB — CBC WITH DIFFERENTIAL/PLATELET
Basophils Absolute: 0.1 x10E3/uL (ref 0.0–0.2)
Basos: 1 %
EOS (ABSOLUTE): 0.1 x10E3/uL (ref 0.0–0.4)
Eos: 1 %
Hematocrit: 41.3 % (ref 34.0–46.6)
Hemoglobin: 13.6 g/dL (ref 11.1–15.9)
Immature Grans (Abs): 0 x10E3/uL (ref 0.0–0.1)
Immature Granulocytes: 0 %
Lymphocytes Absolute: 1.7 x10E3/uL (ref 0.7–3.1)
Lymphs: 20 %
MCH: 30.8 pg (ref 26.6–33.0)
MCHC: 32.9 g/dL (ref 31.5–35.7)
MCV: 94 fL (ref 79–97)
Monocytes Absolute: 0.4 x10E3/uL (ref 0.1–0.9)
Monocytes: 5 %
Neutrophils Absolute: 6 x10E3/uL (ref 1.4–7.0)
Neutrophils: 73 %
Platelets: 311 x10E3/uL (ref 150–450)
RBC: 4.41 x10E6/uL (ref 3.77–5.28)
RDW: 12.5 % (ref 11.7–15.4)
WBC: 8.2 x10E3/uL (ref 3.4–10.8)

## 2023-10-29 LAB — CMP14+EGFR
ALT: 21 IU/L (ref 0–32)
AST: 26 IU/L (ref 0–40)
Albumin: 4.5 g/dL (ref 3.7–4.7)
Alkaline Phosphatase: 60 IU/L (ref 44–121)
BUN/Creatinine Ratio: 12 (ref 12–28)
BUN: 17 mg/dL (ref 8–27)
Bilirubin Total: 0.5 mg/dL (ref 0.0–1.2)
CO2: 20 mmol/L (ref 20–29)
Calcium: 9.7 mg/dL (ref 8.7–10.3)
Chloride: 88 mmol/L — AB (ref 96–106)
Creatinine, Ser: 1.38 mg/dL — AB (ref 0.57–1.00)
Globulin, Total: 2.5 g/dL (ref 1.5–4.5)
Glucose: 285 mg/dL — AB (ref 70–99)
Potassium: 3.8 mmol/L (ref 3.5–5.2)
Sodium: 127 mmol/L — AB (ref 134–144)
Total Protein: 7 g/dL (ref 6.0–8.5)
eGFR: 38 mL/min/1.73 — AB (ref >59)

## 2023-10-29 LAB — LIPID PANEL
Cholesterol, Total: 148 mg/dL (ref 100–199)
HDL: 36 mg/dL — AB (ref >39)
LDL CALC COMMENT:: 4.1 ratio (ref 0.0–4.4)
LDL Chol Calc (NIH): 52 mg/dL (ref 0–99)
Triglycerides: 396 mg/dL — AB (ref 0–149)
VLDL Cholesterol Cal: 60 mg/dL — AB (ref 5–40)

## 2023-11-05 ENCOUNTER — Ambulatory Visit: Payer: Self-pay

## 2023-11-05 ENCOUNTER — Other Ambulatory Visit: Payer: Self-pay | Admitting: Nurse Practitioner

## 2023-11-05 DIAGNOSIS — K5150 Left sided colitis without complications: Secondary | ICD-10-CM

## 2023-11-05 MED ORDER — ONDANSETRON HCL 4 MG PO TABS
4.0000 mg | ORAL_TABLET | Freq: Three times a day (TID) | ORAL | 1 refills | Status: AC | PRN
Start: 2023-11-05 — End: ?

## 2023-11-05 MED ORDER — ONDANSETRON HCL 4 MG PO TABS: 4.0000 mg | ORAL_TABLET | Freq: Three times a day (TID) | ORAL | 1 refills | Status: DC | PRN

## 2023-11-05 NOTE — Telephone Encounter (Signed)
 FYI Only or Action Required?: Action required by provider: clinical question for provider.  Patient was last seen in primary care on 10/28/2023 by [name] Mustard, FNP.  Called Nurse Triage reporting Diarrhea.  Symptoms began yesterday.  Interventions attempted: OTC medications: Imodium  and Prescription medications: zofran .  Symptoms are: gradually worsening.  Triage Disposition: See Physician Within 24 Hours  Patient/caregiver understands and will follow disposition?: No- Patient wants to know if PCP can call in a medication without a visit because she is unable to drive.   Copied from CRM #[id number]. Topic: Clinical - Red Word Triage >> Nov 05, 2023  2:29 PM [name] wrote: Red Word that prompted transfer to Nurse Triage: Patient thinks she has food poisoning. Since yesterday afternoon has been vomiting and had diarrhea. Unable to hold anything down. Reason for Disposition  [1] SEVERE diarrhea (e.g., 7 or more times / day more than normal) AND [2] present > 24 hours (1 day)  Answer Assessment - Initial Assessment Questions 1. DIARRHEA SEVERITY: How bad is the diarrhea? How many more stools have you had in the past 24 hours than normal?      15 times  2. ONSET: When did the diarrhea begin?      Started yesterday  3. STOOL DESCRIPTION:  How loose or watery is the diarrhea? What is the stool color? Is there any blood or mucous in the stool?     Watery diarrhea  4. VOMITING: Are you also vomiting? If Yes, ask: How many times in the past 24 hours?      15 times  5. ABDOMEN PAIN: Are you having any abdomen pain? If Yes, ask: What does it feel like? (e.g., crampy, dull, intermittent, constant)      Yes  6. ABDOMEN PAIN SEVERITY: If present, ask: How bad is the pain?  (e.g., Scale 1-10; mild, moderate, or severe)     Mild  7. ORAL INTAKE: If vomiting, Have you been able to drink liquids? How much liquids have you had in the past 24 hours?     Drinking  okay  8. HYDRATION: Any signs of dehydration? (e.g., dry mouth [not just dry lips], too weak to stand, dizziness, new weight loss) When did you last urinate?     Yes, feels dehydrated  9. EXPOSURE: Have you traveled to a foreign country recently? Have you been exposed to anyone with diarrhea? Could you have eaten any food that was spoiled?     Possible exposure  10. ANTIBIOTIC USE: Are you taking antibiotics now or have you taken antibiotics in the past 2 months?       No  11. OTHER SYMPTOMS: Do you have any other symptoms? (e.g., fever, blood in stool)       No  12. PREGNANCY: Is there any chance you are pregnant? When was your last menstrual period?       No  Protocols used: Diarrhea-A-AH

## 2023-11-08 ENCOUNTER — Telehealth: Payer: Self-pay

## 2023-11-08 NOTE — Telephone Encounter (Addendum)
 Charted in error.

## 2023-12-25 ENCOUNTER — Other Ambulatory Visit: Payer: Self-pay | Admitting: Nurse Practitioner

## 2023-12-25 DIAGNOSIS — F3342 Major depressive disorder, recurrent, in full remission: Secondary | ICD-10-CM

## 2024-01-27 ENCOUNTER — Encounter: Payer: Self-pay | Admitting: Nurse Practitioner

## 2024-01-27 ENCOUNTER — Ambulatory Visit: Payer: Self-pay | Admitting: Nurse Practitioner

## 2024-01-27 VITALS — BP 149/80 | HR 73 | Temp 97.5°F | Ht 69.0 in | Wt 163.0 lb

## 2024-01-27 DIAGNOSIS — E1169 Type 2 diabetes mellitus with other specified complication: Secondary | ICD-10-CM

## 2024-01-27 DIAGNOSIS — F3342 Major depressive disorder, recurrent, in full remission: Secondary | ICD-10-CM | POA: Diagnosis not present

## 2024-01-27 DIAGNOSIS — K5150 Left sided colitis without complications: Secondary | ICD-10-CM

## 2024-01-27 DIAGNOSIS — R001 Bradycardia, unspecified: Secondary | ICD-10-CM

## 2024-01-27 DIAGNOSIS — E785 Hyperlipidemia, unspecified: Secondary | ICD-10-CM

## 2024-01-27 DIAGNOSIS — L989 Disorder of the skin and subcutaneous tissue, unspecified: Secondary | ICD-10-CM

## 2024-01-27 DIAGNOSIS — I10 Essential (primary) hypertension: Secondary | ICD-10-CM | POA: Diagnosis not present

## 2024-01-27 DIAGNOSIS — F5101 Primary insomnia: Secondary | ICD-10-CM

## 2024-01-27 DIAGNOSIS — E1122 Type 2 diabetes mellitus with diabetic chronic kidney disease: Secondary | ICD-10-CM

## 2024-01-27 DIAGNOSIS — M797 Fibromyalgia: Secondary | ICD-10-CM | POA: Diagnosis not present

## 2024-01-27 DIAGNOSIS — E119 Type 2 diabetes mellitus without complications: Secondary | ICD-10-CM | POA: Diagnosis not present

## 2024-01-27 DIAGNOSIS — N1832 Chronic kidney disease, stage 3b: Secondary | ICD-10-CM

## 2024-01-27 LAB — BAYER DCA HB A1C WAIVED: HB A1C (BAYER DCA - WAIVED): 8.5 % — ABNORMAL HIGH (ref 4.8–5.6)

## 2024-01-27 MED ORDER — METHOCARBAMOL 500 MG PO TABS: ORAL_TABLET | ORAL | 1 refills | Status: DC

## 2024-01-27 MED ORDER — OXYCODONE HCL 5 MG PO TABS: 5.0000 mg | ORAL_TABLET | Freq: Two times a day (BID) | ORAL | 0 refills | Status: DC

## 2024-01-27 MED ORDER — GLIPIZIDE ER 2.5 MG PO TB24: 2.5000 mg | ORAL_TABLET | Freq: Every day | ORAL | 3 refills | Status: DC

## 2024-01-27 NOTE — Patient Instructions (Signed)
 Glipizide Extended-Release Tablets What is this medication? GLIPIZIDE (GLIP i zide ) treats type 2 diabetes. It works by increasing insulin  levels in your body, which decreases your blood sugar (glucose). It also helps your body use insulin  more effectively. It belongs to a group of medications called sulfonylureas. Changes to diet and exercise are often combined with this medication. This medicine may be used for other purposes; ask your health care provider or pharmacist if you have questions. COMMON BRAND NAME(S): Glucotrol XL What should I tell my care team before I take this medication? They need to know if you have any of these conditions: Diabetic ketoacidosis G6PD deficiency Heart disease Kidney disease Liver disease Porphyria Severe infection or injury Thyroid disease An unusual or allergic reaction to glipizide, other medications, foods, dyes, or preservatives Pregnant or trying to get pregnant Breastfeeding How should I use this medication? Take this medication by mouth. Take it as directed on the prescription label at the same time every day. Swallow the tablets with a drink of water  and take with your breakfast. Talk to your care team about the use of this medication in children. Special care may be needed. People 65 years and older may have a stronger reaction and need a smaller dose. Overdosage: If you think you have taken too much of this medicine contact a poison control center or emergency room at once. NOTE: This medicine is only for you. Do not share this medicine with others. What if I miss a dose? If you miss a dose, take it as soon as you can. If it is almost time for your next dose, take only that dose. Do not take double or extra doses. What may interact with this medication? Bosentan Chloramphenicol Cisapride Medications for fungal or yeast infections Metoclopramide  Probenecid Warfarin Many medications may cause an increase or decrease in blood sugar, these  include: Alcohol  Aspirin  and aspirin -like medications Chloramphenicol Chromium Clarithromycin Estrogen or progestin hormones Isoniazid MAOIs, such as Nardil, Parnate, Marplan, Eldepryl Medications for allergies, asthma, cold, or cough Medications for heart disease Medications for mental health conditions Medications for weight loss Niacin NSAIDs, medications for pain and inflammation, such as ibuprofen or naproxen Pentamidine Phenytoin Probenecid Quinolone antibiotics, such as ciprofloxacin , levofloxacin , ofloxacin  Some herbal dietary supplements Steroid medications, such as prednisone  or cortisone Testosterone or anabolic steroids Thyroid medication Water  pills or diuretics This list may not describe all possible interactions. Give your health care provider a list of all the medicines, herbs, non-prescription drugs, or dietary supplements you use. Also tell them if you smoke, drink alcohol , or use illegal drugs. Some items may interact with your medicine. What should I watch for while using this medication? Visit your care team for regular checks on your progress. A test called the HbA1C (A1C) will be monitored. This is a simple blood test. It measures your blood sugar control over the last 2 to 3 months. You will receive this test every 3 to 6 months. Learn how to check your blood sugar. Learn the symptoms of low and high blood sugar and how to manage them. Always carry a quick-source of sugar with you in case you have symptoms of low blood sugar. Examples include hard sugar candy or glucose tablets. Make sure others know that you can choke if you eat or drink when you develop serious symptoms of low blood sugar, such as seizures or unconsciousness. They must get medical help at once. Tell your care team if you have high blood sugar. You might need to  change the dose of your medication. If you are sick or exercising more than usual, you might need to change the dose of your  medication. Do not skip meals. Ask your care team if you should avoid alcohol . Many nonprescription cough and cold products contain sugar or alcohol . These can affect blood sugar. This medication can make you more sensitive to the sun. Keep out of the sun. If you cannot avoid being in the sun, wear protective clothing and use sunscreen. Do not use sun lamps or tanning beds/booths. Wear a medical ID bracelet or chain, and carry a card that describes your disease and details of your medication and dosage times. What side effects may I notice from receiving this medication? Side effects that you should report to your care team as soon as possible: Allergic reactions--skin rash, itching, hives, swelling of the face, lips, tongue, or throat Bowel blockage--stomach cramping, unable to have a bowel movement or pass gas, loss of appetite, vomiting Low blood sugar (hypoglycemia)--tremors or shaking, anxiety, sweating, cold or clammy skin, confusion, dizziness, rapid heartbeat Hemolytic anemia--unusual weakness or fatigue, dizziness, headache, trouble breathing, dark urine, yellowing skin or eyes Side effects that usually do not require medical attention (report to your care team if they continue or are bothersome): Diarrhea Dizziness Gas Nausea Tremors or shaking This list may not describe all possible side effects. Call your doctor for medical advice about side effects. You may report side effects to FDA at 1-800-FDA-1088. Where should I keep my medication? Keep out of the reach of children. Store at room temperature between 15 and 30 degrees C (59 and 86 degrees F). Protect from moisture and humidity. Throw away any unused medication after the expiration date. NOTE: This sheet is a summary. It may not cover all possible information. If you have questions about this medicine, talk to your doctor, pharmacist, or health care provider.  2024 Elsevier/Gold Standard (2021-10-16 00:00:00)

## 2024-01-27 NOTE — Progress Notes (Signed)
 Subjective:    Patient ID: [name], female    DOB: 02/14/1941, 83 y.o.   MRN: [id number]   Chief Complaint: medical management of chronic issues     HPI:  [name] is a 83 y.o. who identifies as a female who was assigned female at birth.   Social history: Lives with: by herself Work history: rteired   Comes in today for follow up of the following chronic medical issues:  1. Essential hypertension No c/o chest pain, sob or headache. Doe snot check blood pressure at home. BP Readings from Last 3 Encounters:  10/28/23 (!) 141/80  08/02/23 (!) 161/91  05/04/23 119/77     2. Hyperlipidemia associated with type 2 diabetes mellitus (HCC) Does not watch diet and does no dedicated exercise Lab Results  Component Value Date   CHOL 148 10/28/2023   HDL 36 (L) 10/28/2023   LDLCALC 52 10/28/2023   TRIG 396 (H) 10/28/2023   CHOLHDL 4.1 10/28/2023     3. Recurrent major depressive disorder, in full remission (HCC) Is on combination of lexapro  and buspar . Is doing well.    10/28/2023    2:31 PM 08/02/2023    2:23 PM 05/04/2023    2:27 PM 01/04/2023    2:01 PM  GAD 7 : Generalized Anxiety Score  Nervous, Anxious, on Edge 0 0 1 1  Control/stop worrying 0 0 1 0  Worry too much - different things 1 0 1 1  Trouble relaxing 1 0 1 1  Restless 1 0 0 0  Easily annoyed or irritable 0 0 0 0  Afraid - awful might happen 0 0 1 0  Total GAD 7 Score 3 0 5 3  Anxiety Difficulty Not difficult at all Not difficult at all Not difficult at all Somewhat difficult       10/28/2023    2:31 PM 08/02/2023    2:23 PM 05/04/2023    2:26 PM  Depression screen PHQ 2/9  Decreased Interest 1 0 0  Down, Depressed, Hopeless 0 0 1  PHQ - 2 Score 1 0 1  Altered sleeping  0 1  Tired, decreased energy  0 1  Change in appetite  0 0  Feeling bad or failure about yourself   0 0  Trouble concentrating  0 0  Moving slowly or fidgety/restless  0 0  Suicidal thoughts  0 0  PHQ-9 Score  0  3    Difficult doing work/chores  Not difficult at all Not difficult at all     Data saved with a previous flowsheet row definition      4. Fibromyalgia Pain assessment: Cause of pain- fibromyalgia Pain location- varies from day to day Pain on scale of 1-10- 2/10-  Frequency- daily What increases pain-to much activity What makes pain Better-rest Effects on ADL - none Any change in general medical condition-no  Current opioids rx- oxycodone  5mg  BID # meds rx- 60 Effectiveness of current meds-helps Adverse reactions from pain meds-none Morphine  equivalent- 15 MME  Pill count performed-No Last drug screen - 04/10/21 ( high risk q52m, moderate risk q10m, low risk yearly ) Urine drug screen today- No Was the [facility] reviewed- yes  If yes were their any concerning findings? - no   Overdose risk: 1   Pain contract signed on:02/04/23   5. Diet-controlled diabetes mellitus (HCC) She very seldom checks her blood sugars. No medication changes at last visit.patient wanted to try diet control. Lab Results  Component Value Date  HGBA1C 9.0 (H) 10/28/2023     6. ULCERATIVE COLITIS, LEFT SIDED Has chronic pain and often diarrhea  7. Stage 3b chronic kidney disease (HCC) No voiding issues Lab Results  Component Value Date   CREATININE 1.38 (H) 10/28/2023     8. Bradycardia Denies syncopal or near syncopal episodes. Pulse Readings from Last 3 Encounters:  10/28/23 86  08/02/23 81  05/04/23 73     9. Primary insomnia Is on trazadone and is doing well    New complaints: None today  Allergies  Allergen Reactions   Azathioprine Nausea And Vomiting and Other (See Comments)    SEVERE NAUSEA AND VOMITING WITH CHEST PAIN-  PATIENT INSISTS NEVER TO BE GIVEN ANYTHING SIMILAR TO THIS   Tape Other (See Comments) and Rash    BLISTERS AND SKIN TEARING   Codeine Nausea And Vomiting   Milk (Cow) Nausea And Vomiting   Penicillins Hives    Has patient had a PCN reaction  causing immediate rash, facial/tongue/throat swelling, SOB or lightheadedness with hypotension: No Has patient had a PCN reaction causing severe rash involving mucus membranes or skin necrosis: No Has patient had a PCN reaction that required hospitalization: No Has patient had a PCN reaction occurring within the last 10 years: No If all of the above answers are NO, then may proceed with Cephalosporin use.    Povidone-Iodine  Hives    BETADINE   Procaine Hcl Other (See Comments)    SEVERE GI UPSET (NAUSEA,VOMITING AND DIARRHEA)   Sulfonamide Derivatives Hives   Tramadol  Itching   Acyclovir And Related    Azithromycin      Mycins    Clonazepam  Other (See Comments)    Skin burning, insomnia, diarrhea.   Dairy Aid [Tilactase]    Doxycycline  Rash   Latex Rash   Niacin And Related Rash   Outpatient Encounter Medications as of 01/27/2024  Medication Sig   amLODipine  (NORVASC ) 5 MG tablet Take 1 tablet (5 mg total) by mouth daily.   antiseptic oral rinse (BIOTENE) LIQD 1 application by Mouth Rinse route daily. FOR DRY MOUTH   benazepril -hydrochlorthiazide (LOTENSIN  HCT) 20-12.5 MG tablet Take 2 tablets by mouth daily.   Blood Glucose Monitoring Suppl (ONETOUCH VERIO FLEX SYSTEM) w/Device KIT USE AS DIRECTED WITH TESTING SUPPLIES UP TO 4 TIMES A DAY   busPIRone  (BUSPAR ) 10 MG tablet TAKE 1 TABLET 3 TIMES A DAY   co-enzyme Q-10 50 MG capsule Take 50 mg by mouth once a week.   dicyclomine  (BENTYL ) 20 MG tablet TAKE ONE-HALF TO 1 TABLET DAILY   escitalopram  (LEXAPRO ) 20 MG tablet Take 1 tablet (20 mg total) by mouth daily.   glucose blood (ONETOUCH VERIO) test strip Test BS up to 4 times daily or as directed Dx E11.22   hydrocortisone  (ANUSOL -HC) 2.5 % rectal cream Place rectally 2 (two) times daily.   JANUVIA  100 MG tablet Take 1 tablet (100 mg total) by mouth daily.   Lancets (ONETOUCH DELICA PLUS LANCET33G) MISC CHECK BLOOD SUGAR UP TO 4 TIMES A DAY OR AS DIRECTED   Loperamide  HCl (IMODIUM   PO) Take 1 tablet by mouth daily as needed (diarrhea).   methocarbamol  (ROBAXIN ) 500 MG tablet TAKE ONE TABLET EVERY 6 HOURS AS NEEDED FOR MUSCLE SPASMS   Multiple Vitamins-Minerals (WOMENS MULTI VITAMIN & MINERAL PO) Take 1 tablet by mouth every other day.   Omega-3 Fatty Acids (FISH OIL) 1000 MG CPDR Take 3 capsules by mouth daily.   ondansetron  (ZOFRAN ) 4 MG tablet Take 1  tablet (4 mg total) by mouth every 8 (eight) hours as needed for nausea or vomiting.   oxyCODONE  (OXY IR/ROXICODONE ) 5 MG immediate release tablet Take 1 tablet (5 mg total) by mouth in the morning and at bedtime. TAKE (1) TABLET TWICE DAILY.   oxyCODONE  (OXY IR/ROXICODONE ) 5 MG immediate release tablet Take 1 tablet (5 mg total) by mouth in the morning and at bedtime. TAKE (1) TABLET TWICE DAILY.   oxyCODONE  (OXY IR/ROXICODONE ) 5 MG immediate release tablet Take 1 tablet (5 mg total) by mouth in the morning and at bedtime. TAKE (1) TABLET TWICE DAILY.   Probiotic Product (PROBIOTIC FORMULA PO) Take 1-2 capsules by mouth every morning.   rosuvastatin  (CRESTOR ) 10 MG tablet Take 1 tablet (10 mg total) by mouth daily.   shark liver oil-cocoa butter (PREPARATION H) 0.25-3-85.5 % suppository Place 1 suppository rectally as needed for hemorrhoids.   traZODone  (DESYREL ) 150 MG tablet TAKE ONE-HALF TO 1 TABLET AT BEDTIME AS NEEDED FOR SLEEP   vitamin E  400 UNIT capsule Take 400 Units by mouth every morning.   [DISCONTINUED] sodium chloride  (OCEAN) 0.65 % SOLN nasal spray Place 2 sprays into the nose as needed for congestion.   No facility-administered encounter medications on file as of 01/27/2024.    Past Surgical History:  Procedure Laterality Date   ABDOMINAL HYSTERECTOMY     BIOPSY N/A 12/28/2014   Procedure: SIGMOID AND RECTAL BIOPSIES;  Surgeon: Claudis RAYMOND Rivet, MD;  Location: [facility];  Service: Endoscopy;  Laterality: N/A;   CHOLECYSTECTOMY     s/p   COLONOSCOPY  06/16/06. 04/27/2007   proctitis seen, biopsies showed  chronic active colitis, no dysplasia    EYE SURGERY     clear lense extraction 1998    FLEXIBLE SIGMOIDOSCOPY N/A 12/28/2014   Procedure: FLEXIBLE SIGMOIDOSCOPY WITH PROPOFOL ;  Surgeon: Claudis RAYMOND Rivet, MD;  Location: [facility];  Service: Endoscopy;  Laterality: N/A;    Family History  Problem Relation Age of Onset   Heart attack Mother 22       Died in her sleep   Diabetes Mother    Heart attack Father 97       Died in her sleep   Cancer Sister        liver, breast    Heart disease Sister    Breast cancer Maternal Aunt    Breast cancer Maternal Aunt    Heart attack Brother 4       Defib, CABG   Diabetes Brother    Kidney disease Brother        dyalasis         Review of Systems  Constitutional:  Negative for diaphoresis.  Eyes:  Negative for pain.  Respiratory:  Negative for shortness of breath.   Cardiovascular:  Negative for chest pain, palpitations and leg swelling.  Gastrointestinal:  Negative for abdominal pain.  Endocrine: Negative for polydipsia.  Musculoskeletal:  Positive for myalgias.  Skin:  Negative for rash.  Neurological:  Negative for dizziness, weakness and headaches.  Hematological:  Does not bruise/bleed easily.  All other systems reviewed and are negative.      Objective:   Physical Exam Vitals and nursing note reviewed.  Constitutional:      General: She is not in acute distress.    Appearance: Normal appearance. She is well-developed.  HENT:     Head: Normocephalic.     Right Ear: Tympanic membrane normal.     Left Ear: Tympanic membrane normal.  Nose: Nose normal.     Mouth/Throat:     Mouth: Mucous membranes are moist.  Eyes:     Pupils: Pupils are equal, round, and reactive to light.  Neck:     Vascular: No carotid bruit or JVD.  Cardiovascular:     Rate and Rhythm: Normal rate and regular rhythm.     Heart sounds: Murmur (2/6) heard.  Pulmonary:     Effort: Pulmonary effort is normal. No respiratory distress.     Breath  sounds: Normal breath sounds. No wheezing or rales.  Chest:     Chest wall: No tenderness.  Abdominal:     General: Bowel sounds are normal. There is no distension or abdominal bruit.     Palpations: Abdomen is soft. There is no hepatomegaly, splenomegaly, mass or pulsatile mass.     Tenderness: There is no abdominal tenderness.  Musculoskeletal:        General: Normal range of motion.     Cervical back: Normal range of motion and neck supple.  Lymphadenopathy:     Cervical: No cervical adenopathy.  Skin:    General: Skin is warm and dry.  Neurological:     Mental Status: She is alert and oriented to person, place, and time.     Deep Tendon Reflexes: Reflexes are normal and symmetric.  Psychiatric:        Behavior: Behavior normal.        Thought Content: Thought content normal.        Judgment: Judgment normal.     BP (!) 149/80   Pulse 73   Temp (!) 97.5 F (36.4 C) (Temporal)   Ht 5' 9 (1.753 m)   Wt 163 lb (73.9 kg)   SpO2 95%   BMI 24.07 kg/m      HGBA1c 8.5%      Assessment & Plan:  Anneke Cundy comes in today with chief complaint of medical management of chronic issues    Diagnosis and orders addressed:  1. Essential hypertension (Primary) Low sodium diet - CBC with Differential/Platelet - CMP14+EGFR  2. Hyperlipidemia associated with type 2 diabetes mellitus (HCC) Low fat diet - Lipid panel  3. Type 2 diabetes with chronic kidney disease(HCC) Watch cabs in diet- stricter Added glipizide 2.5mg   XL daily - Bayer DCA Hb A1c Waived  4. Recurrent major depressive disorder, in full remission (HCC) Stress management - busPIRone  (BUSPAR ) 10 MG tablet; Take 1 tablet (10 mg total) by mouth 3 (three) times daily.  Dispense: 90 tablet; Refill: 1 - escitalopram  (LEXAPRO ) 20 MG tablet; Take 1 tablet (20 mg total) by mouth daily.  Dispense: 90 tablet; Refill: 1  5. Fibromyalgia Exercise to keep muscle warm - oxyCODONE  (OXY IR/ROXICODONE ) 5 MG immediate  release tablet; Take 1 tablet (5 mg total) by mouth in the morning and at bedtime. TAKE (1) TABLET TWICE DAILY.  Dispense: 60 tablet; Refill: 0 - oxyCODONE  (OXY IR/ROXICODONE ) 5 MG immediate release tablet; Take 1 tablet (5 mg total) by mouth in the morning and at bedtime. TAKE (1) TABLET TWICE DAILY.  Dispense: 60 tablet; Refill: 0 - oxyCODONE  (OXY IR/ROXICODONE ) 5 MG immediate release tablet; Take 1 tablet (5 mg total) by mouth in the morning and at bedtime. TAKE (1) TABLET TWICE DAILY.  Dispense: 60 tablet; Refill: 0  6. ULCERATIVE COLITIS, LEFT SIDED Watch diet  7. Stage 3b chronic kidney disease (HCC) Labs pending  8. Bradycardia  9. Primary insomnia Bedtime routine - traZODone  (DESYREL ) 150 MG tablet; TAKE  1/2 TO 1 TABLET BY MOUTH AT BEDTIME AS NEEDED FOR SLEEP.  Dispense: 30 tablet; Refill: 2   Labs pending Health Maintenance reviewed Diet and exercise encouraged  Follow up plan: 3 months   Mary-Margaret Gladis, FNP

## 2024-01-28 ENCOUNTER — Ambulatory Visit: Payer: Self-pay | Admitting: Nurse Practitioner

## 2024-01-28 ENCOUNTER — Other Ambulatory Visit: Payer: Self-pay | Admitting: *Deleted

## 2024-01-29 ENCOUNTER — Other Ambulatory Visit: Payer: Self-pay | Admitting: *Deleted

## 2024-01-29 DIAGNOSIS — F5101 Primary insomnia: Secondary | ICD-10-CM

## 2024-01-29 DIAGNOSIS — K5150 Left sided colitis without complications: Secondary | ICD-10-CM

## 2024-01-29 DIAGNOSIS — F3342 Major depressive disorder, recurrent, in full remission: Secondary | ICD-10-CM

## 2024-02-01 ENCOUNTER — Other Ambulatory Visit: Payer: Self-pay | Admitting: Nurse Practitioner

## 2024-02-01 DIAGNOSIS — F5101 Primary insomnia: Secondary | ICD-10-CM

## 2024-02-16 NOTE — Progress Notes (Signed)
 [name]                                          MRN: [id number]   02/16/2024   The [facility] Quality Team Specialist reviewed this patient medical record for the purposes of chart review for care gap closure. The following were reviewed: chart review for care gap closure-kidney health evaluation for diabetes:eGFR  and uACR.    [facility] Quality Team

## 2024-03-25 ENCOUNTER — Other Ambulatory Visit: Payer: Self-pay | Admitting: Nurse Practitioner

## 2024-03-25 DIAGNOSIS — M797 Fibromyalgia: Secondary | ICD-10-CM

## 2024-04-27 ENCOUNTER — Ambulatory Visit: Admitting: Nurse Practitioner

## 2024-04-27 ENCOUNTER — Encounter: Payer: Self-pay | Admitting: Nurse Practitioner

## 2024-04-27 VITALS — BP 128/81 | HR 82 | Ht 69.0 in | Wt 162.0 lb

## 2024-04-27 DIAGNOSIS — F5101 Primary insomnia: Secondary | ICD-10-CM

## 2024-04-27 DIAGNOSIS — F3342 Major depressive disorder, recurrent, in full remission: Secondary | ICD-10-CM

## 2024-04-27 DIAGNOSIS — R001 Bradycardia, unspecified: Secondary | ICD-10-CM

## 2024-04-27 DIAGNOSIS — M797 Fibromyalgia: Secondary | ICD-10-CM

## 2024-04-27 DIAGNOSIS — E1169 Type 2 diabetes mellitus with other specified complication: Secondary | ICD-10-CM

## 2024-04-27 DIAGNOSIS — K5150 Left sided colitis without complications: Secondary | ICD-10-CM

## 2024-04-27 DIAGNOSIS — I10 Essential (primary) hypertension: Secondary | ICD-10-CM

## 2024-04-27 DIAGNOSIS — N1832 Chronic kidney disease, stage 3b: Secondary | ICD-10-CM

## 2024-04-27 LAB — BAYER DCA HB A1C WAIVED: HB A1C (BAYER DCA - WAIVED): 9.3 % — ABNORMAL HIGH (ref 4.8–5.6)

## 2024-04-27 MED ORDER — OXYCODONE HCL 5 MG PO TABS: 5.0000 mg | ORAL_TABLET | Freq: Two times a day (BID) | ORAL | 0 refills | Status: AC

## 2024-04-27 MED ORDER — BUSPIRONE HCL 10 MG PO TABS: 10.0000 mg | ORAL_TABLET | Freq: Three times a day (TID) | ORAL | 2 refills | Status: AC

## 2024-04-27 MED ORDER — ESCITALOPRAM OXALATE 20 MG PO TABS: 20.0000 mg | ORAL_TABLET | Freq: Every day | ORAL | 1 refills | Status: AC

## 2024-04-27 MED ORDER — PROMETHAZINE-DM 6.25-15 MG/5ML PO SYRP: 5.0000 mL | ORAL_SOLUTION | Freq: Four times a day (QID) | ORAL | 0 refills | Status: AC | PRN

## 2024-04-27 MED ORDER — DICYCLOMINE HCL 20 MG PO TABS: ORAL_TABLET | ORAL | 2 refills | Status: AC

## 2024-04-27 MED ORDER — GLIPIZIDE ER 10 MG PO TB24: 10.0000 mg | ORAL_TABLET | Freq: Every day | ORAL | 1 refills | Status: AC

## 2024-04-27 MED ORDER — AMLODIPINE BESYLATE 5 MG PO TABS: 5.0000 mg | ORAL_TABLET | Freq: Every day | ORAL | 1 refills | Status: AC

## 2024-04-27 MED ORDER — ROSUVASTATIN CALCIUM 10 MG PO TABS: 10.0000 mg | ORAL_TABLET | Freq: Every day | ORAL | 1 refills | Status: AC

## 2024-04-27 MED ORDER — DICYCLOMINE HCL 20 MG PO TABS: ORAL_TABLET | ORAL | 2 refills | Status: DC

## 2024-04-27 MED ORDER — JANUVIA 100 MG PO TABS: 100.0000 mg | ORAL_TABLET | Freq: Every day | ORAL | 1 refills | Status: AC

## 2024-04-27 MED ORDER — BENAZEPRIL-HYDROCHLOROTHIAZIDE 20-12.5 MG PO TABS: 2.0000 | ORAL_TABLET | Freq: Every day | ORAL | 1 refills | Status: AC

## 2024-04-27 MED ORDER — TRAZODONE HCL 150 MG PO TABS: ORAL_TABLET | ORAL | 1 refills | Status: AC

## 2024-04-27 NOTE — Patient Instructions (Signed)

## 2024-04-27 NOTE — Progress Notes (Signed)
 "  Subjective:    Patient ID: [name], female    DOB: 1940-10-19, 84 y.o.   MRN: [id number]   Chief Complaint: medical management of chronic issues     HPI:  [name] is a 84 y.o. who identifies as a female who was assigned female at birth.   Social history: Lives with: by herself Work history: rteired   Comes in today for follow up of the following chronic medical issues:  1. Essential hypertension No c/o chest pain, sob or headache. Doe snot check blood pressure at home. BP Readings from Last 3 Encounters:  01/27/24 (!) 149/80  10/28/23 (!) 141/80  08/02/23 (!) 161/91     2. Hyperlipidemia associated with type 2 diabetes mellitus (HCC) Does not watch diet and does no dedicated exercise Lab Results  Component Value Date   CHOL 148 10/28/2023   HDL 36 (L) 10/28/2023   LDLCALC 52 10/28/2023   TRIG 396 (H) 10/28/2023   CHOLHDL 4.1 10/28/2023     3. Recurrent major depressive disorder, in full remission (HCC) Is on combination of lexapro  and buspar . Is doing well.     04/27/2024    2:06 PM 01/27/2024    2:24 PM 10/28/2023    2:31 PM  Depression screen PHQ 2/9  Decreased Interest 1 1 1   Down, Depressed, Hopeless 0 1 0  PHQ - 2 Score 1 2 1   Altered sleeping 0 1   Tired, decreased energy 0 1   Change in appetite 0 0   Feeling bad or failure about yourself  0 0   Trouble concentrating 0 0   Moving slowly or fidgety/restless 0 0   Suicidal thoughts 0 0   PHQ-9 Score 1 4   Difficult doing work/chores Not difficult at all Not difficult at all       04/27/2024    2:08 PM 01/27/2024    2:25 PM 10/28/2023    2:31 PM 08/02/2023    2:23 PM  GAD 7 : Generalized Anxiety Score  Nervous, Anxious, on Edge 0 0  0  0   Control/stop worrying 1 0  0  0   Worry too much - different things 0 0  1  0   Trouble relaxing 1 1  1   0   Restless 0 0  1  0   Easily annoyed or irritable 0 0  0  0   Afraid - awful might happen 0 0  0  0   Total GAD 7 Score 2 1 3  0  Anxiety  Difficulty Not difficult at all Not difficult at all Not difficult at all Not difficult at all     Data saved with a previous flowsheet row definition           4. Fibromyalgia Pain assessment: Cause of pain- fibromyalgia Pain location- varies from day to day Pain on scale of 1-10- 2/10-  Frequency- daily What increases pain-to much activity What makes pain Better-rest Effects on ADL - none Any change in general medical condition-no  Current opioids rx- oxycodone  5mg  BID # meds rx- 60 Effectiveness of current meds-helps Adverse reactions from pain meds-none Morphine  equivalent- 15 MME  Pill count performed-No Last drug screen - 04/10/21 ( high risk q73m, moderate risk q41m, low risk yearly ) Urine drug screen today- No Was the [facility] reviewed- yes  If yes were their any concerning findings? - no   Overdose risk: 1   Pain contract signed on:02/04/23   5.  Diet-controlled diabetes mellitus (HCC) She very seldom checks her blood sugars. Added glipizide  2.5 daily Lab Results  Component Value Date   HGBA1C 8.5 (H) 01/27/2024     6. ULCERATIVE COLITIS, LEFT SIDED Has chronic pain and often diarrhea  7. Stage 3b chronic kidney disease (HCC) No voiding issues Lab Results  Component Value Date   CREATININE 1.38 (H) 10/28/2023     8. Bradycardia Denies syncopal or near syncopal episodes. Pulse Readings from Last 3 Encounters:  01/27/24 73  10/28/23 86  08/02/23 81     9. Primary insomnia Is on trazadone and is doing well    New complaints: - has cough- started 2 weeks ago. Has been taking mucinex  DM but not really helping. No fever. Voice is a  little hoarse.  Allergies  Allergen Reactions   Azathioprine Nausea And Vomiting and Other (See Comments)    SEVERE NAUSEA AND VOMITING WITH CHEST PAIN-  PATIENT INSISTS NEVER TO BE GIVEN ANYTHING SIMILAR TO THIS   Tape Other (See Comments) and Rash    BLISTERS AND SKIN TEARING   Codeine Nausea And Vomiting    Milk (Cow) Nausea And Vomiting   Penicillins Hives    Has patient had a PCN reaction causing immediate rash, facial/tongue/throat swelling, SOB or lightheadedness with hypotension: No Has patient had a PCN reaction causing severe rash involving mucus membranes or skin necrosis: No Has patient had a PCN reaction that required hospitalization: No Has patient had a PCN reaction occurring within the last 10 years: No If all of the above answers are NO, then may proceed with Cephalosporin use.    Povidone-Iodine  Hives    BETADINE   Procaine Hcl Other (See Comments)    SEVERE GI UPSET (NAUSEA,VOMITING AND DIARRHEA)   Sulfonamide Derivatives Hives   Tramadol  Itching   Acyclovir And Related    Azithromycin      Mycins    Clonazepam  Other (See Comments)    Skin burning, insomnia, diarrhea.   Dairy Aid [Tilactase]    Doxycycline  Rash   Latex Rash   Niacin And Related Rash   Outpatient Encounter Medications as of 04/27/2024  Medication Sig   amLODipine  (NORVASC ) 5 MG tablet Take 1 tablet (5 mg total) by mouth daily.   antiseptic oral rinse (BIOTENE) LIQD 1 application by Mouth Rinse route daily. FOR DRY MOUTH   benazepril -hydrochlorthiazide (LOTENSIN  HCT) 20-12.5 MG tablet Take 2 tablets by mouth daily.   Blood Glucose Monitoring Suppl (ONETOUCH VERIO FLEX SYSTEM) w/Device KIT USE AS DIRECTED WITH TESTING SUPPLIES UP TO 4 TIMES A DAY   busPIRone  (BUSPAR ) 10 MG tablet TAKE 1 TABLET 3 TIMES A DAY   co-enzyme Q-10 50 MG capsule Take 50 mg by mouth once a week.   dicyclomine  (BENTYL ) 20 MG tablet TAKE 1/2 TO 1 TABLET DAILY   escitalopram  (LEXAPRO ) 20 MG tablet Take 1 tablet (20 mg total) by mouth daily.   glipiZIDE  (GLUCOTROL  XL) 2.5 MG 24 hr tablet Take 1 tablet (2.5 mg total) by mouth daily with breakfast.   glucose blood (ONETOUCH VERIO) test strip Test BS up to 4 times daily or as directed Dx E11.22   hydrocortisone  (ANUSOL -HC) 2.5 % rectal cream Place rectally 2 (two) times daily.    JANUVIA  100 MG tablet Take 1 tablet (100 mg total) by mouth daily.   Lancets (ONETOUCH DELICA PLUS LANCET33G) MISC Test BS up to 4 times daily or as directed Dx E11.22   Loperamide  HCl (IMODIUM  PO) Take 1 tablet by  mouth daily as needed (diarrhea).   methocarbamol  (ROBAXIN ) 500 MG tablet TAKE ONE TABLET EVERY 6 HOURS AS NEEDED FOR MUSCLE SPASMS   Multiple Vitamins-Minerals (WOMENS MULTI VITAMIN & MINERAL PO) Take 1 tablet by mouth every other day.   Omega-3 Fatty Acids (FISH OIL) 1000 MG CPDR Take 3 capsules by mouth daily.   ondansetron  (ZOFRAN ) 4 MG tablet Take 1 tablet (4 mg total) by mouth every 8 (eight) hours as needed for nausea or vomiting.   oxyCODONE  (OXY IR/ROXICODONE ) 5 MG immediate release tablet Take 1 tablet (5 mg total) by mouth in the morning and at bedtime. TAKE (1) TABLET TWICE DAILY.   oxyCODONE  (OXY IR/ROXICODONE ) 5 MG immediate release tablet Take 1 tablet (5 mg total) by mouth in the morning and at bedtime. TAKE (1) TABLET TWICE DAILY.   oxyCODONE  (OXY IR/ROXICODONE ) 5 MG immediate release tablet Take 1 tablet (5 mg total) by mouth in the morning and at bedtime. TAKE (1) TABLET TWICE DAILY.   Probiotic Product (PROBIOTIC FORMULA PO) Take 1-2 capsules by mouth every morning.   rosuvastatin  (CRESTOR ) 10 MG tablet Take 1 tablet (10 mg total) by mouth daily.   shark liver oil-cocoa butter (PREPARATION H) 0.25-3-85.5 % suppository Place 1 suppository rectally as needed for hemorrhoids.   traZODone  (DESYREL ) 150 MG tablet TAKE 1/2 TO 1 TABLET AT BEDTIME AS NEEDED FOR SLEEP   vitamin E  400 UNIT capsule Take 400 Units by mouth every morning.   [DISCONTINUED] sodium chloride  (OCEAN) 0.65 % SOLN nasal spray Place 2 sprays into the nose as needed for congestion.   No facility-administered encounter medications on file as of 04/27/2024.    Past Surgical History:  Procedure Laterality Date   ABDOMINAL HYSTERECTOMY     BIOPSY N/A 12/28/2014   Procedure: SIGMOID AND RECTAL BIOPSIES;   Surgeon: Claudis RAYMOND Rivet, MD;  Location: [facility];  Service: Endoscopy;  Laterality: N/A;   CHOLECYSTECTOMY     s/p   COLONOSCOPY  06/16/06. 04/27/2007   proctitis seen, biopsies showed chronic active colitis, no dysplasia    EYE SURGERY     clear lense extraction 1998    FLEXIBLE SIGMOIDOSCOPY N/A 12/28/2014   Procedure: FLEXIBLE SIGMOIDOSCOPY WITH PROPOFOL ;  Surgeon: Claudis RAYMOND Rivet, MD;  Location: [facility];  Service: Endoscopy;  Laterality: N/A;    Family History  Problem Relation Age of Onset   Heart attack Mother 35       Died in her sleep   Diabetes Mother    Heart attack Father 39       Died in her sleep   Cancer Sister        liver, breast    Heart disease Sister    Breast cancer Maternal Aunt    Breast cancer Maternal Aunt    Heart attack Brother 84       Defib, CABG   Diabetes Brother    Kidney disease Brother        dyalasis         Review of Systems  Constitutional:  Negative for diaphoresis.  Eyes:  Negative for pain.  Respiratory:  Negative for shortness of breath.   Cardiovascular:  Negative for chest pain, palpitations and leg swelling.  Gastrointestinal:  Negative for abdominal pain.  Endocrine: Negative for polydipsia.  Musculoskeletal:  Positive for myalgias.  Skin:  Negative for rash.  Neurological:  Negative for dizziness, weakness and headaches.  Hematological:  Does not bruise/bleed easily.  All other systems reviewed and are negative.  Objective:   Physical Exam Vitals and nursing note reviewed.  Constitutional:      General: She is not in acute distress.    Appearance: Normal appearance. She is well-developed.  HENT:     Head: Normocephalic.     Right Ear: Tympanic membrane normal.     Left Ear: Tympanic membrane normal.     Nose: Nose normal.     Mouth/Throat:     Mouth: Mucous membranes are moist.  Eyes:     Pupils: Pupils are equal, round, and reactive to light.  Neck:     Vascular: No carotid bruit or JVD.  Cardiovascular:      Rate and Rhythm: Normal rate and regular rhythm.     Heart sounds: Murmur (2/6) heard.  Pulmonary:     Effort: Pulmonary effort is normal. No respiratory distress.     Breath sounds: Normal breath sounds. No wheezing or rales.  Chest:     Chest wall: No tenderness.  Abdominal:     General: Bowel sounds are normal. There is no distension or abdominal bruit.     Palpations: Abdomen is soft. There is no hepatomegaly, splenomegaly, mass or pulsatile mass.     Tenderness: There is no abdominal tenderness.  Musculoskeletal:        General: Normal range of motion.     Cervical back: Normal range of motion and neck supple.  Lymphadenopathy:     Cervical: No cervical adenopathy.  Skin:    General: Skin is warm and dry.  Neurological:     Mental Status: She is alert and oriented to person, place, and time.     Deep Tendon Reflexes: Reflexes are normal and symmetric.  Psychiatric:        Behavior: Behavior normal.        Thought Content: Thought content normal.        Judgment: Judgment normal.     BP 128/81   Pulse 82   Ht 5' 9 (1.753 m)   Wt 162 lb (73.5 kg)   SpO2 96%   BMI 23.92 kg/m      HGBA1c 9.3%      [name] comes in today with chief complaint of Medical Management of Chronic Issues, Diabetes, and Hypertension   Diagnosis and orders addressed:  1. Essential hypertension (Primary) Dash diet - Bayer DCA Hb A1c Waived - CBC with Differential/Platelet - CMP14+EGFR - Lipid panel - TSH - amLODipine  (NORVASC ) 5 MG tablet; Take 1 tablet (5 mg total) by mouth daily.  Dispense: 90 tablet; Refill: 1 - benazepril -hydrochlorthiazide (LOTENSIN  HCT) 20-12.5 MG tablet; Take 2 tablets by mouth daily.  Dispense: 180 tablet; Refill: 1  2. Hyperlipidemia associated with type 2 diabetes mellitus (HCC) Low fat diet - Bayer DCA Hb A1c Waived - CBC with Differential/Platelet - CMP14+EGFR - Lipid panel - TSH - rosuvastatin  (CRESTOR ) 10 MG tablet; Take 1 tablet (10 mg  total) by mouth daily.  Dispense: 90 tablet; Refill: 1  3. Type 2 diabetes mellitus with other specified complication, without long-term current use of insulin  (HCC) Strict carb counting Increase glipizide  to 10mg  XL daily May need to go on insulin  if does not improve - JANUVIA  100 MG tablet; Take 1 tablet (100 mg total) by mouth daily.  Dispense: 90 tablet; Refill: 1 - glipiZIDE  (GLUCOTROL  XL) 10 MG 24 hr tablet; Take 1 tablet (10 mg total) by mouth daily with breakfast.  Dispense: 90 tablet; Refill: 1  4. Fibromyalgia Stay warm Encouraged exercise - oxyCODONE  (  OXY IR/ROXICODONE ) 5 MG immediate release tablet; Take 1 tablet (5 mg total) by mouth in the morning and at bedtime. TAKE (1) TABLET TWICE DAILY.  Dispense: 60 tablet; Refill: 0 - oxyCODONE  (OXY IR/ROXICODONE ) 5 MG immediate release tablet; Take 1 tablet (5 mg total) by mouth in the morning and at bedtime. TAKE (1) TABLET TWICE DAILY.  Dispense: 60 tablet; Refill: 0 - oxyCODONE  (OXY IR/ROXICODONE ) 5 MG immediate release tablet; Take 1 tablet (5 mg total) by mouth in the morning and at bedtime. TAKE (1) TABLET TWICE DAILY.  Dispense: 60 tablet; Refill: 0  5. ULCERATIVE COLITIS, LEFT SIDED Watch diet to prevent flare up - dicyclomine  (BENTYL ) 20 MG tablet; TAKE 1/2 TO 1 TABLET DAILY  Dispense: 30 tablet; Refill: 2  6. Stage 3b chronic kidney disease (HCC) Labs pending  7. Bradycardia  8. Primary insomnia Bedtime routine - traZODone  (DESYREL ) 150 MG tablet; TAKE 1/2 TO 1 TABLET AT BEDTIME AS NEEDED FOR SLEEP  Dispense: 90 tablet; Refill: 1  9. Recurrent major depressive disorder, in full remission Stress management - busPIRone  (BUSPAR ) 10 MG tablet; Take 1 tablet (10 mg total) by mouth 3 (three) times daily.  Dispense: 90 tablet; Refill: 2 - escitalopram  (LEXAPRO ) 20 MG tablet; Take 1 tablet (20 mg total) by mouth daily.  Dispense: 90 tablet; Refill: 1  Cough meds can cause sedation  Labs pending Health Maintenance  reviewed Diet and exercise encouraged  Follow up plan: 3 months   Mary-Margaret Gladis, FNP   "

## 2024-04-28 ENCOUNTER — Ambulatory Visit: Payer: Self-pay | Admitting: Nurse Practitioner

## 2024-04-28 LAB — CMP14+EGFR
ALT: 16 [IU]/L (ref 0–32)
AST: 21 [IU]/L (ref 0–40)
Albumin: 4.2 g/dL (ref 3.7–4.7)
Alkaline Phosphatase: 51 [IU]/L (ref 48–129)
BUN/Creatinine Ratio: 9 — ABNORMAL LOW (ref 12–28)
BUN: 12 mg/dL (ref 8–27)
Bilirubin Total: 0.6 mg/dL (ref 0.0–1.2)
CO2: 24 mmol/L (ref 20–29)
Calcium: 9.6 mg/dL (ref 8.7–10.3)
Chloride: 89 mmol/L — ABNORMAL LOW (ref 96–106)
Creatinine, Ser: 1.28 mg/dL — ABNORMAL HIGH (ref 0.57–1.00)
Globulin, Total: 2.1 g/dL (ref 1.5–4.5)
Glucose: 286 mg/dL — ABNORMAL HIGH (ref 70–99)
Potassium: 3.8 mmol/L (ref 3.5–5.2)
Sodium: 131 mmol/L — ABNORMAL LOW (ref 134–144)
Total Protein: 6.3 g/dL (ref 6.0–8.5)
eGFR: 42 mL/min/{1.73_m2} — ABNORMAL LOW

## 2024-04-28 LAB — CBC WITH DIFFERENTIAL/PLATELET
Basophils Absolute: 0.1 10*3/uL (ref 0.0–0.2)
Basos: 1 %
EOS (ABSOLUTE): 0.1 10*3/uL (ref 0.0–0.4)
Eos: 1 %
Hematocrit: 39.3 % (ref 34.0–46.6)
Hemoglobin: 13.1 g/dL (ref 11.1–15.9)
Immature Grans (Abs): 0 10*3/uL (ref 0.0–0.1)
Immature Granulocytes: 0 %
Lymphocytes Absolute: 1.6 10*3/uL (ref 0.7–3.1)
Lymphs: 22 %
MCH: 30.3 pg (ref 26.6–33.0)
MCHC: 33.3 g/dL (ref 31.5–35.7)
MCV: 91 fL (ref 79–97)
Monocytes Absolute: 0.5 10*3/uL (ref 0.1–0.9)
Monocytes: 7 %
Neutrophils Absolute: 4.9 10*3/uL (ref 1.4–7.0)
Neutrophils: 69 %
Platelets: 274 10*3/uL (ref 150–450)
RBC: 4.32 x10E6/uL (ref 3.77–5.28)
RDW: 12.9 % (ref 11.7–15.4)
WBC: 7.1 10*3/uL (ref 3.4–10.8)

## 2024-04-28 LAB — LIPID PANEL
Chol/HDL Ratio: 3.6 ratio (ref 0.0–4.4)
Cholesterol, Total: 144 mg/dL (ref 100–199)
HDL: 40 mg/dL
LDL Chol Calc (NIH): 57 mg/dL (ref 0–99)
Triglycerides: 299 mg/dL — ABNORMAL HIGH (ref 0–149)
VLDL Cholesterol Cal: 47 mg/dL — ABNORMAL HIGH (ref 5–40)

## 2024-04-28 LAB — TSH: TSH: 0.913 u[IU]/mL (ref 0.450–4.500)

## 2024-07-25 ENCOUNTER — Ambulatory Visit: Admitting: Nurse Practitioner
